# Patient Record
Sex: Female | Born: 1970
Health system: Southern US, Community
[De-identification: ages and names within clinical notes are randomized; demographics above are authoritative.]

## PROBLEM LIST (undated history)

## (undated) DIAGNOSIS — I2699 Other pulmonary embolism without acute cor pulmonale: Secondary | ICD-10-CM

## (undated) DIAGNOSIS — G43909 Migraine, unspecified, not intractable, without status migrainosus: Secondary | ICD-10-CM

## (undated) DIAGNOSIS — J40 Bronchitis, not specified as acute or chronic: Secondary | ICD-10-CM

## (undated) DIAGNOSIS — N189 Chronic kidney disease, unspecified: Secondary | ICD-10-CM

## (undated) DIAGNOSIS — D649 Anemia, unspecified: Secondary | ICD-10-CM

## (undated) DIAGNOSIS — I82409 Acute embolism and thrombosis of unspecified deep veins of unspecified lower extremity: Secondary | ICD-10-CM

## (undated) DIAGNOSIS — J45909 Unspecified asthma, uncomplicated: Secondary | ICD-10-CM

## (undated) DIAGNOSIS — I10 Essential (primary) hypertension: Secondary | ICD-10-CM

## (undated) DIAGNOSIS — E669 Obesity, unspecified: Secondary | ICD-10-CM

## (undated) DIAGNOSIS — N200 Calculus of kidney: Secondary | ICD-10-CM

## (undated) DIAGNOSIS — M1990 Unspecified osteoarthritis, unspecified site: Secondary | ICD-10-CM

## (undated) HISTORY — DX: Unspecified asthma, uncomplicated: J45.909

## (undated) HISTORY — DX: Essential (primary) hypertension: I10

## (undated) HISTORY — DX: Chronic kidney disease, unspecified: N18.9

## (undated) HISTORY — PX: CERVIX LESION DESTRUCTION: SHX591

## (undated) HISTORY — DX: Anemia, unspecified: D64.9

## (undated) HISTORY — DX: Calculus of kidney: N20.0

## (undated) HISTORY — DX: Migraine, unspecified, not intractable, without status migrainosus: G43.909

## (undated) HISTORY — DX: Obesity, unspecified: E66.9

## (undated) HISTORY — PX: TUBAL LIGATION: SHX77

---

## 1998-07-09 ENCOUNTER — Emergency Department (HOSPITAL_COMMUNITY): Admission: EM | Admit: 1998-07-09 | Discharge: 1998-07-09 | Payer: Self-pay | Admitting: Emergency Medicine

## 1999-01-03 ENCOUNTER — Emergency Department (HOSPITAL_COMMUNITY): Admission: EM | Admit: 1999-01-03 | Discharge: 1999-01-03 | Payer: Self-pay | Admitting: Emergency Medicine

## 1999-01-12 ENCOUNTER — Encounter: Payer: Self-pay | Admitting: Emergency Medicine

## 1999-01-12 ENCOUNTER — Emergency Department (HOSPITAL_COMMUNITY): Admission: EM | Admit: 1999-01-12 | Discharge: 1999-01-12 | Payer: Self-pay | Admitting: Emergency Medicine

## 2000-10-20 ENCOUNTER — Encounter: Payer: Self-pay | Admitting: Emergency Medicine

## 2000-10-20 ENCOUNTER — Emergency Department (HOSPITAL_COMMUNITY): Admission: EM | Admit: 2000-10-20 | Discharge: 2000-10-20 | Payer: Self-pay | Admitting: Emergency Medicine

## 2000-12-13 ENCOUNTER — Emergency Department (HOSPITAL_COMMUNITY): Admission: EM | Admit: 2000-12-13 | Discharge: 2000-12-13 | Payer: Self-pay | Admitting: Emergency Medicine

## 2000-12-13 ENCOUNTER — Encounter: Payer: Self-pay | Admitting: Emergency Medicine

## 2001-01-28 ENCOUNTER — Emergency Department (HOSPITAL_COMMUNITY): Admission: EM | Admit: 2001-01-28 | Discharge: 2001-01-28 | Payer: Self-pay | Admitting: Emergency Medicine

## 2001-03-14 ENCOUNTER — Emergency Department (HOSPITAL_COMMUNITY): Admission: EM | Admit: 2001-03-14 | Discharge: 2001-03-14 | Payer: Self-pay | Admitting: Emergency Medicine

## 2001-03-14 ENCOUNTER — Encounter: Payer: Self-pay | Admitting: Emergency Medicine

## 2001-09-11 ENCOUNTER — Emergency Department (HOSPITAL_COMMUNITY): Admission: EM | Admit: 2001-09-11 | Discharge: 2001-09-11 | Payer: Self-pay

## 2001-09-15 ENCOUNTER — Emergency Department (HOSPITAL_COMMUNITY): Admission: EM | Admit: 2001-09-15 | Discharge: 2001-09-15 | Payer: Self-pay | Admitting: Emergency Medicine

## 2001-09-15 ENCOUNTER — Encounter: Payer: Self-pay | Admitting: Emergency Medicine

## 2001-12-03 ENCOUNTER — Encounter: Admission: RE | Admit: 2001-12-03 | Discharge: 2001-12-03 | Payer: Self-pay | Admitting: Family Medicine

## 2001-12-03 ENCOUNTER — Other Ambulatory Visit: Admission: RE | Admit: 2001-12-03 | Discharge: 2001-12-03 | Payer: Self-pay | Admitting: Obstetrics

## 2002-01-08 ENCOUNTER — Encounter: Admission: RE | Admit: 2002-01-08 | Discharge: 2002-01-08 | Payer: Self-pay | Admitting: Family Medicine

## 2002-01-16 ENCOUNTER — Encounter: Admission: RE | Admit: 2002-01-16 | Discharge: 2002-01-16 | Payer: Self-pay | Admitting: Family Medicine

## 2002-04-29 ENCOUNTER — Encounter: Admission: RE | Admit: 2002-04-29 | Discharge: 2002-04-29 | Payer: Self-pay | Admitting: Family Medicine

## 2002-07-16 ENCOUNTER — Encounter: Admission: RE | Admit: 2002-07-16 | Discharge: 2002-07-16 | Payer: Self-pay | Admitting: Family Medicine

## 2002-11-03 ENCOUNTER — Inpatient Hospital Stay (HOSPITAL_COMMUNITY): Admission: AD | Admit: 2002-11-03 | Discharge: 2002-11-03 | Payer: Self-pay | Admitting: *Deleted

## 2003-01-14 ENCOUNTER — Encounter: Admission: RE | Admit: 2003-01-14 | Discharge: 2003-01-14 | Payer: Self-pay | Admitting: Family Medicine

## 2003-02-09 ENCOUNTER — Encounter: Admission: RE | Admit: 2003-02-09 | Discharge: 2003-02-09 | Payer: Self-pay | Admitting: Sports Medicine

## 2003-06-04 ENCOUNTER — Encounter: Admission: RE | Admit: 2003-06-04 | Discharge: 2003-06-04 | Payer: Self-pay | Admitting: Family Medicine

## 2003-08-16 ENCOUNTER — Encounter: Admission: RE | Admit: 2003-08-16 | Discharge: 2003-08-16 | Payer: Self-pay | Admitting: Family Medicine

## 2003-08-21 ENCOUNTER — Emergency Department (HOSPITAL_COMMUNITY): Admission: AD | Admit: 2003-08-21 | Discharge: 2003-08-21 | Payer: Self-pay | Admitting: Emergency Medicine

## 2003-08-23 ENCOUNTER — Encounter: Admission: RE | Admit: 2003-08-23 | Discharge: 2003-08-23 | Payer: Self-pay | Admitting: Family Medicine

## 2003-08-23 ENCOUNTER — Observation Stay (HOSPITAL_COMMUNITY): Admission: AD | Admit: 2003-08-23 | Discharge: 2003-08-25 | Payer: Self-pay | Admitting: Family Medicine

## 2003-08-26 ENCOUNTER — Encounter: Admission: RE | Admit: 2003-08-26 | Discharge: 2003-08-26 | Payer: Self-pay | Admitting: Family Medicine

## 2003-09-06 ENCOUNTER — Encounter: Admission: RE | Admit: 2003-09-06 | Discharge: 2003-09-06 | Payer: Self-pay | Admitting: Family Medicine

## 2003-09-22 ENCOUNTER — Encounter: Admission: RE | Admit: 2003-09-22 | Discharge: 2003-09-22 | Payer: Self-pay | Admitting: Family Medicine

## 2003-11-26 ENCOUNTER — Encounter: Admission: RE | Admit: 2003-11-26 | Discharge: 2003-11-26 | Payer: Self-pay | Admitting: Family Medicine

## 2004-02-24 ENCOUNTER — Encounter: Admission: RE | Admit: 2004-02-24 | Discharge: 2004-02-24 | Payer: Self-pay | Admitting: Family Medicine

## 2004-05-03 ENCOUNTER — Encounter: Admission: RE | Admit: 2004-05-03 | Discharge: 2004-05-03 | Payer: Self-pay | Admitting: Family Medicine

## 2004-05-10 ENCOUNTER — Encounter: Admission: RE | Admit: 2004-05-10 | Discharge: 2004-05-10 | Payer: Self-pay | Admitting: Family Medicine

## 2004-08-15 ENCOUNTER — Ambulatory Visit: Payer: Self-pay | Admitting: Sports Medicine

## 2004-09-11 ENCOUNTER — Ambulatory Visit: Payer: Self-pay

## 2004-11-03 ENCOUNTER — Ambulatory Visit: Payer: Self-pay | Admitting: Sports Medicine

## 2004-11-10 ENCOUNTER — Ambulatory Visit: Payer: Self-pay | Admitting: Sports Medicine

## 2005-01-18 ENCOUNTER — Ambulatory Visit: Payer: Self-pay | Admitting: Family Medicine

## 2005-03-30 ENCOUNTER — Ambulatory Visit: Payer: Self-pay | Admitting: Family Medicine

## 2005-04-19 ENCOUNTER — Ambulatory Visit: Payer: Self-pay | Admitting: Sports Medicine

## 2005-05-11 ENCOUNTER — Ambulatory Visit: Payer: Self-pay | Admitting: Family Medicine

## 2005-05-14 ENCOUNTER — Ambulatory Visit: Payer: Self-pay | Admitting: Family Medicine

## 2005-06-01 ENCOUNTER — Ambulatory Visit: Payer: Self-pay | Admitting: Family Medicine

## 2005-06-05 ENCOUNTER — Ambulatory Visit (HOSPITAL_COMMUNITY): Admission: RE | Admit: 2005-06-05 | Discharge: 2005-06-05 | Payer: Self-pay | Admitting: Family Medicine

## 2005-08-14 ENCOUNTER — Encounter: Admission: RE | Admit: 2005-08-14 | Discharge: 2005-08-14 | Payer: Self-pay | Admitting: Sports Medicine

## 2005-09-12 ENCOUNTER — Ambulatory Visit: Payer: Self-pay | Admitting: Family Medicine

## 2005-11-20 ENCOUNTER — Ambulatory Visit: Payer: Self-pay | Admitting: Sports Medicine

## 2005-11-27 ENCOUNTER — Ambulatory Visit: Payer: Self-pay | Admitting: Sports Medicine

## 2006-03-27 ENCOUNTER — Ambulatory Visit: Payer: Self-pay | Admitting: Family Medicine

## 2006-04-21 ENCOUNTER — Encounter (INDEPENDENT_AMBULATORY_CARE_PROVIDER_SITE_OTHER): Payer: Self-pay | Admitting: *Deleted

## 2006-04-21 LAB — CONVERTED CEMR LAB

## 2006-05-01 ENCOUNTER — Ambulatory Visit: Payer: Self-pay | Admitting: Family Medicine

## 2006-07-26 ENCOUNTER — Ambulatory Visit: Payer: Self-pay | Admitting: Family Medicine

## 2006-07-30 ENCOUNTER — Ambulatory Visit: Payer: Self-pay | Admitting: Family Medicine

## 2006-08-28 ENCOUNTER — Ambulatory Visit: Payer: Self-pay | Admitting: Family Medicine

## 2006-10-30 ENCOUNTER — Encounter (INDEPENDENT_AMBULATORY_CARE_PROVIDER_SITE_OTHER): Payer: Self-pay | Admitting: Family Medicine

## 2006-10-30 ENCOUNTER — Ambulatory Visit: Payer: Self-pay | Admitting: Family Medicine

## 2006-10-30 LAB — CONVERTED CEMR LAB
Chlamydia, DNA Probe: NEGATIVE
GC Probe Amp, Genital: NEGATIVE
TSH: 0.794 microintl units/mL (ref 0.350–5.50)

## 2006-11-19 ENCOUNTER — Ambulatory Visit: Payer: Self-pay | Admitting: Family Medicine

## 2006-12-13 ENCOUNTER — Ambulatory Visit: Payer: Self-pay | Admitting: Family Medicine

## 2006-12-19 DIAGNOSIS — E669 Obesity, unspecified: Secondary | ICD-10-CM | POA: Insufficient documentation

## 2006-12-20 ENCOUNTER — Encounter (INDEPENDENT_AMBULATORY_CARE_PROVIDER_SITE_OTHER): Payer: Self-pay | Admitting: *Deleted

## 2006-12-25 ENCOUNTER — Ambulatory Visit: Payer: Self-pay | Admitting: Family Medicine

## 2007-01-17 ENCOUNTER — Ambulatory Visit: Payer: Self-pay | Admitting: Family Medicine

## 2007-01-17 DIAGNOSIS — G43009 Migraine without aura, not intractable, without status migrainosus: Secondary | ICD-10-CM

## 2007-03-21 ENCOUNTER — Ambulatory Visit: Payer: Self-pay | Admitting: Family Medicine

## 2007-03-21 DIAGNOSIS — D500 Iron deficiency anemia secondary to blood loss (chronic): Secondary | ICD-10-CM | POA: Insufficient documentation

## 2007-03-21 DIAGNOSIS — D509 Iron deficiency anemia, unspecified: Secondary | ICD-10-CM | POA: Insufficient documentation

## 2007-03-21 LAB — CONVERTED CEMR LAB
HCT: 38 %
Hemoglobin: 12.9 g/dL
MCV: 89.6 fL
Platelets: 274 10*3/uL
RBC: 4.24 M/uL
WBC: 5.7 10*3/uL

## 2007-03-28 ENCOUNTER — Ambulatory Visit: Payer: Self-pay

## 2007-04-11 ENCOUNTER — Ambulatory Visit: Payer: Self-pay | Admitting: Family Medicine

## 2007-05-06 ENCOUNTER — Ambulatory Visit: Payer: Self-pay | Admitting: Family Medicine

## 2007-05-06 ENCOUNTER — Encounter: Payer: Self-pay | Admitting: Family Medicine

## 2007-05-06 LAB — CONVERTED CEMR LAB: Whiff Test: POSITIVE

## 2007-05-09 ENCOUNTER — Encounter: Payer: Self-pay | Admitting: Family Medicine

## 2007-05-13 ENCOUNTER — Telehealth (INDEPENDENT_AMBULATORY_CARE_PROVIDER_SITE_OTHER): Payer: Self-pay | Admitting: *Deleted

## 2007-05-19 ENCOUNTER — Telehealth: Payer: Self-pay | Admitting: Family Medicine

## 2007-06-03 ENCOUNTER — Encounter: Payer: Self-pay | Admitting: Family Medicine

## 2007-06-03 ENCOUNTER — Ambulatory Visit: Payer: Self-pay | Admitting: Family Medicine

## 2007-06-03 DIAGNOSIS — R51 Headache: Secondary | ICD-10-CM

## 2007-06-03 DIAGNOSIS — R519 Headache, unspecified: Secondary | ICD-10-CM | POA: Insufficient documentation

## 2007-06-03 LAB — CONVERTED CEMR LAB
Cholesterol: 143 mg/dL
HDL: 70 mg/dL
LDL Cholesterol: 63 mg/dL
Total CHOL/HDL Ratio: 2
Triglycerides: 51 mg/dL
VLDL: 10 mg/dL

## 2007-06-04 ENCOUNTER — Encounter: Payer: Self-pay | Admitting: Family Medicine

## 2007-06-17 ENCOUNTER — Encounter: Payer: Self-pay | Admitting: Family Medicine

## 2007-09-03 ENCOUNTER — Ambulatory Visit: Payer: Self-pay | Admitting: Family Medicine

## 2007-09-03 ENCOUNTER — Encounter: Payer: Self-pay | Admitting: Family Medicine

## 2007-09-03 LAB — CONVERTED CEMR LAB
Chlamydia, DNA Probe: NEGATIVE
GC Probe Amp, Genital: NEGATIVE
Whiff Test: POSITIVE

## 2007-09-05 ENCOUNTER — Encounter: Payer: Self-pay | Admitting: Family Medicine

## 2008-02-18 ENCOUNTER — Telehealth: Payer: Self-pay | Admitting: *Deleted

## 2008-02-19 ENCOUNTER — Ambulatory Visit: Payer: Self-pay | Admitting: Family Medicine

## 2008-02-26 ENCOUNTER — Encounter: Payer: Self-pay | Admitting: Family Medicine

## 2008-02-26 ENCOUNTER — Ambulatory Visit: Payer: Self-pay | Admitting: Family Medicine

## 2008-02-26 DIAGNOSIS — I10 Essential (primary) hypertension: Secondary | ICD-10-CM

## 2008-02-26 LAB — CONVERTED CEMR LAB
ALT: 12 U/L
AST: 17 U/L
Albumin: 4 g/dL
Alkaline Phosphatase: 40 U/L
BUN: 8 mg/dL
CO2: 23 meq/L
Calcium: 8.8 mg/dL
Chloride: 109 meq/L
Cholesterol: 167 mg/dL
Creatinine, Ser: 0.76 mg/dL
Glucose, Bld: 93 mg/dL
HCT: 37.3 %
HDL: 68 mg/dL
Hemoglobin: 12 g/dL
LDL Cholesterol: 89 mg/dL
MCHC: 32.2 g/dL
MCV: 91 fL
Platelets: 255 K/uL
Potassium: 3.8 meq/L
RBC: 4.1 M/uL
RDW: 12.8 %
Sodium: 142 meq/L
TSH: 0.627 u[IU]/mL
Total Bilirubin: 0.3 mg/dL
Total CHOL/HDL Ratio: 2.5
Total Protein: 6.8 g/dL
Triglycerides: 51 mg/dL
VLDL: 10 mg/dL
WBC: 5.7 10*3/microliter

## 2008-02-27 ENCOUNTER — Encounter: Payer: Self-pay | Admitting: Family Medicine

## 2008-03-18 ENCOUNTER — Encounter: Payer: Self-pay | Admitting: Family Medicine

## 2008-03-22 ENCOUNTER — Ambulatory Visit: Payer: Self-pay | Admitting: Family Medicine

## 2008-03-22 ENCOUNTER — Encounter: Payer: Self-pay | Admitting: Family Medicine

## 2008-03-22 LAB — CONVERTED CEMR LAB
BUN: 11 mg/dL (ref 6–23)
CO2: 23 meq/L (ref 19–32)
Calcium: 9.1 mg/dL (ref 8.4–10.5)
Chloride: 104 meq/L (ref 96–112)
Creatinine, Ser: 0.84 mg/dL (ref 0.40–1.20)
Glucose, Bld: 95 mg/dL (ref 70–99)
Potassium: 4.1 meq/L (ref 3.5–5.3)
Sodium: 140 meq/L (ref 135–145)

## 2008-05-06 ENCOUNTER — Encounter: Payer: Self-pay | Admitting: Family Medicine

## 2008-05-06 ENCOUNTER — Ambulatory Visit: Payer: Self-pay | Admitting: Family Medicine

## 2008-05-06 LAB — CONVERTED CEMR LAB: Whiff Test: NEGATIVE

## 2008-05-10 LAB — CONVERTED CEMR LAB
Chlamydia, DNA Probe: NEGATIVE
Cholesterol: 170 mg/dL (ref 0–200)
GC Probe Amp, Genital: NEGATIVE
HDL: 66 mg/dL (ref >39)
LDL Cholesterol: 83 mg/dL (ref 0–99)
Total CHOL/HDL Ratio: 2.6
Triglycerides: 103 mg/dL (ref <150)
VLDL: 21 mg/dL (ref 0–40)

## 2008-06-04 ENCOUNTER — Telehealth: Payer: Self-pay | Admitting: *Deleted

## 2008-06-17 ENCOUNTER — Encounter: Payer: Self-pay | Admitting: Family Medicine

## 2008-08-12 ENCOUNTER — Telehealth: Payer: Self-pay | Admitting: Family Medicine

## 2008-08-20 ENCOUNTER — Telehealth: Payer: Self-pay | Admitting: Family Medicine

## 2008-10-18 ENCOUNTER — Ambulatory Visit: Payer: Self-pay | Admitting: Family Medicine

## 2008-10-18 ENCOUNTER — Encounter: Payer: Self-pay | Admitting: Family Medicine

## 2008-10-18 LAB — CONVERTED CEMR LAB
Chlamydia, DNA Probe: NEGATIVE
GC Probe Amp, Genital: NEGATIVE
Vitamin B-12: 339 pg/mL (ref 211–911)
Whiff Test: NEGATIVE

## 2008-10-19 ENCOUNTER — Encounter: Payer: Self-pay | Admitting: Family Medicine

## 2008-11-05 ENCOUNTER — Telehealth: Payer: Self-pay | Admitting: *Deleted

## 2008-11-05 ENCOUNTER — Ambulatory Visit: Payer: Self-pay | Admitting: Family Medicine

## 2008-12-01 ENCOUNTER — Telehealth: Payer: Self-pay | Admitting: Family Medicine

## 2008-12-10 ENCOUNTER — Ambulatory Visit: Payer: Self-pay | Admitting: Family Medicine

## 2009-05-04 ENCOUNTER — Telehealth: Payer: Self-pay | Admitting: Family Medicine

## 2009-05-11 ENCOUNTER — Encounter: Payer: Self-pay | Admitting: Family Medicine

## 2009-05-11 ENCOUNTER — Ambulatory Visit: Payer: Self-pay | Admitting: Family Medicine

## 2009-05-11 LAB — CONVERTED CEMR LAB
BUN: 12 mg/dL (ref 6–23)
CO2: 22 meq/L (ref 19–32)
Calcium: 8.9 mg/dL (ref 8.4–10.5)
Chlamydia, DNA Probe: NEGATIVE
Chloride: 109 meq/L (ref 96–112)
Creatinine, Ser: 0.86 mg/dL (ref 0.40–1.20)
GC Probe Amp, Genital: NEGATIVE
Glucose, Bld: 92 mg/dL (ref 70–99)
HCT: 38.4 % (ref 36.0–46.0)
Hemoglobin: 12.1 g/dL (ref 12.0–15.0)
MCHC: 31.5 g/dL (ref 30.0–36.0)
MCV: 91 fL (ref 78.0–100.0)
Platelets: 260 10*3/uL (ref 150–400)
Potassium: 3.9 meq/L (ref 3.5–5.3)
RBC: 4.22 M/uL (ref 3.87–5.11)
RDW: 13.7 % (ref 11.5–15.5)
Sodium: 141 meq/L (ref 135–145)
WBC: 6.8 10*3/uL (ref 4.0–10.5)

## 2009-05-12 ENCOUNTER — Encounter: Payer: Self-pay | Admitting: Family Medicine

## 2009-06-14 ENCOUNTER — Telehealth: Payer: Self-pay | Admitting: Family Medicine

## 2009-06-30 ENCOUNTER — Telehealth: Payer: Self-pay | Admitting: Family Medicine

## 2009-08-08 ENCOUNTER — Ambulatory Visit: Payer: Self-pay | Admitting: Family Medicine

## 2009-08-16 ENCOUNTER — Ambulatory Visit: Payer: Self-pay | Admitting: Family Medicine

## 2009-10-25 ENCOUNTER — Telehealth: Payer: Self-pay | Admitting: Family Medicine

## 2009-11-11 ENCOUNTER — Telehealth: Payer: Self-pay | Admitting: Family Medicine

## 2009-12-13 ENCOUNTER — Encounter: Payer: Self-pay | Admitting: Family Medicine

## 2009-12-13 ENCOUNTER — Ambulatory Visit: Payer: Self-pay | Admitting: Family Medicine

## 2009-12-13 LAB — CONVERTED CEMR LAB
Chlamydia, DNA Probe: NEGATIVE
GC Probe Amp, Genital: NEGATIVE
Whiff Test: NEGATIVE

## 2009-12-14 ENCOUNTER — Telehealth: Payer: Self-pay | Admitting: *Deleted

## 2010-02-08 ENCOUNTER — Encounter: Payer: Self-pay | Admitting: Family Medicine

## 2010-02-08 ENCOUNTER — Emergency Department (HOSPITAL_COMMUNITY): Admission: EM | Admit: 2010-02-08 | Discharge: 2010-02-08 | Payer: Self-pay | Admitting: Emergency Medicine

## 2010-05-08 ENCOUNTER — Ambulatory Visit: Payer: Self-pay | Admitting: Family Medicine

## 2010-05-08 ENCOUNTER — Encounter: Payer: Self-pay | Admitting: Family Medicine

## 2010-05-08 LAB — CONVERTED CEMR LAB
BUN: 12 mg/dL (ref 6–23)
CO2: 18 meq/L — ABNORMAL LOW (ref 19–32)
Calcium: 8.6 mg/dL (ref 8.4–10.5)
Chloride: 108 meq/L (ref 96–112)
Cholesterol: 173 mg/dL (ref 0–200)
Creatinine, Ser: 0.93 mg/dL (ref 0.40–1.20)
Glucose, Bld: 82 mg/dL (ref 70–99)
HDL: 72 mg/dL (ref >39)
LDL Cholesterol: 86 mg/dL (ref 0–99)
Sodium: 140 meq/L (ref 135–145)
Total CHOL/HDL Ratio: 2.4
Triglycerides: 75 mg/dL (ref <150)
VLDL: 15 mg/dL (ref 0–40)

## 2010-06-12 ENCOUNTER — Telehealth: Payer: Self-pay | Admitting: *Deleted

## 2010-07-28 ENCOUNTER — Ambulatory Visit: Payer: Self-pay | Admitting: Family Medicine

## 2010-07-28 ENCOUNTER — Encounter: Payer: Self-pay | Admitting: Family Medicine

## 2010-07-28 LAB — CONVERTED CEMR LAB
Chlamydia, DNA Probe: NEGATIVE
GC Probe Amp, Genital: NEGATIVE
Whiff Test: POSITIVE

## 2010-09-26 ENCOUNTER — Encounter: Payer: Self-pay | Admitting: Family Medicine

## 2010-10-06 ENCOUNTER — Telehealth: Payer: Self-pay | Admitting: Family Medicine

## 2010-10-07 ENCOUNTER — Emergency Department (HOSPITAL_COMMUNITY)
Admission: EM | Admit: 2010-10-07 | Discharge: 2010-10-07 | Payer: Self-pay | Source: Home / Self Care | Admitting: Family Medicine

## 2010-11-23 ENCOUNTER — Telehealth: Payer: Self-pay | Admitting: Family Medicine

## 2010-11-23 NOTE — Assessment & Plan Note (Signed)
Summary: female problem,tcb   Vital Signs:  Patient profile:   40 year old female Height:      63.5 inches Weight:      181 pounds BMI:     31.67 BSA:     1.87 Temp:     98.4 degrees F Pulse rate:   60 / minute BP sitting:   139 / 85  Vitals Entered By: Jone Baseman CMA (July 28, 2010 10:38 AM) CC: vaginal discharge Is Patient Diabetic? No Pain Assessment Patient in pain? no        Primary Care Provider:  Ellin Mayhew MD  CC:  vaginal discharge.  History of Present Illness: 40 yo F:  1. Vaginal Discharge: x several days, clear-brown. Ensorses PMHx of frequent BV. Denies odor and itch. + sexually active with 1 partner (but not monogamous). + Hx of STDs. Recently (<6 months) negative for HIV. Expects next menses in 1 week (on OCPs and s/p tubal ligation).  Habits & Providers  Alcohol-Tobacco-Diet     Tobacco Status: quit     Year Quit: 2005  Current Medications (verified): 1)  Propranolol Hcl 40 Mg  Tabs (Propranolol Hcl) .Marland Kitchen.. 1 Tab By Mouth Daily 2)  Sprintec 28 0.25-35 Mg-Mcg Tabs (Norgestimate-Eth Estradiol) .Marland Kitchen.. 1 Tab By Mouth Daily As Directed. Dispense: 3 Months 3)  Celebrex 100 Mg  Caps (Celecoxib) .Marland Kitchen.. 1 By Mouth Two Times A Day As Needed 4)  Flonase 50 Mcg/act  Susp (Fluticasone Propionate) .... 2 Sprays Each Nostril Daily 5)  Topamax 50 Mg Tabs (Topiramate) .Marland Kitchen.. 1 By Mouth Bid 6)  Cetirizine Hcl 10 Mg Tabs (Cetirizine Hcl) .Marland Kitchen.. 1 Tab By Mouth Daily 7)  Nystop 100000 Unit/gm Powd (Nystatin) .... Apply To Affected Area Twice A Day As Needed Disp: 60 Gram 8)  Metronidazole 0.75 % Gel (Metronidazole) .... One Applicator Full At Bedtime For 5 Nights., Qs 9)  Metronidazole 500 Mg  Tabs (Metronidazole) .... One By Mouth Two Times A Day X 7 Days  Allergies (verified): No Known Drug Allergies PMH-FH-SH reviewed for relevance  Review of Systems      See HPI  Physical Exam  General:  Well-developed, well-nourished, in no acute distress; alert,  appropriate and cooperative throughout examination. Vitals reviewed. Genitalia:  Pelvic Exam:        External: normal female genitalia without lesions or masses        Vagina: normal without lesions or masses, small amount of thin white discharge - samples taken        Cervix: normal without lesions or masses        Adnexa: normal bimanual exam without masses or fullness   Impression & Recommendations:  Problem # 1:  VAGINAL DISCHARGE (ICD-623.5) Assessment New  Orders: [facility]- Est Level  3 ([address])  Problem # 2:  BACTERIAL VAGINITIS (ICD-616.10) Assessment: New  Her updated medication list for this problem includes:    Metronidazole 500 Mg Tabs (Metronidazole) ..... One by mouth two times a day x 7 days  Orders: [facility]- Est Level  3 ([address])  Complete Medication List: 1)  Propranolol Hcl 40 Mg Tabs (Propranolol hcl) .Marland Kitchen.. 1 tab by mouth daily 2)  Sprintec 28 0.25-35 Mg-mcg Tabs (Norgestimate-eth estradiol) .Marland Kitchen.. 1 tab by mouth daily as directed. dispense: 3 months 3)  Celebrex 100 Mg Caps (Celecoxib) .Marland Kitchen.. 1 by mouth two times a day as needed 4)  Flonase 50 Mcg/act Susp (Fluticasone propionate) .... 2 sprays each nostril daily 5)  Topamax 50 Mg Tabs (  Topiramate) .Marland Kitchen.. 1 by mouth bid 6)  Cetirizine Hcl 10 Mg Tabs (Cetirizine hcl) .Marland Kitchen.. 1 tab by mouth daily 7)  Nystop 100000 Unit/gm Powd (Nystatin) .... Apply to affected area twice a day as needed disp: 60 gram 8)  Metronidazole 0.75 % Gel (Metronidazole) .... One applicator full at bedtime for 5 nights., qs 9)  Metronidazole 500 Mg Tabs (Metronidazole) .... One by mouth two times a day x 7 days  Other Orders: GC/Chlamydia-[facility] (87591/87491) [facility] Prescriptions: METRONIDAZOLE 500 MG  TABS (METRONIDAZOLE) one by mouth two times a day x 7 days  #14 x 0   Entered and Authorized by:   Helane Rima DO   Signed by:   Helane Rima DO on 07/28/2010   Method used:   Print then Give to Patient   RxID:    [id number]   Laboratory Results  Date/Time Received: July 28, 2010 11:10 AM  Date/Time Reported: July 28, 2010 11:17 AM   [employer] [place] Source: vaginal WBC/hpf: 1-5 Bacteria/hpf: 3+  Cocci Clue cells/hpf: moderate  Positive whiff Yeast/hpf: none Trichomonas/hpf: none Comments: ...........test performed by...........Marland KitchenTerese Door, CMA

## 2010-11-23 NOTE — Miscellaneous (Signed)
Summary: CP  Clinical Lists Changes cp x 40 minutes constantly. she was unable to describe where it was. just kept saying she had CP. worse with lifting & talking. denies SOB, sweating, nausea, no jaw or neck pain. advised calling 911 & going to ED. she agreed.Golden Circle RN  February 08, 2010 3:54 PM

## 2010-11-23 NOTE — Progress Notes (Signed)
Summary: Rx Req  Phone Note Refill Request Message from:  Patient  Refills Requested: Medication #1:  PROPRANOLOL HCL 40 MG  TABS 1 tab by mouth daily PT USES THE HEALTH DEPT.  Initial call taken by: Clydell Hakim,  November 11, 2009 1:50 PM  Follow-up for Phone Call        will forward to MD. Follow-up by: Theresia Lo RN,  November 11, 2009 1:51 PM    Prescriptions: PROPRANOLOL HCL 40 MG  TABS (PROPRANOLOL HCL) 1 tab by mouth daily  #30 x 11   Entered and Authorized by:   Ardeen Garland  MD   Signed by:   Ardeen Garland  MD on 11/11/2009   Method used:   Faxed to ...       [facility] [address] (retail)       [address]       [facility]       [place], [place]  [address]       Ph: [phone]       Fax: [phone]   RxID:   [id number]

## 2010-11-23 NOTE — Assessment & Plan Note (Signed)
Summary: CPE   Vital Signs:  Patient profile:   40 year old female Height:      63.5 inches Weight:      192 pounds BMI:     33.60 Temp:     98.5 degrees F oral BP sitting:   128 / 86  (left arm) Cuff size:   large  Vitals Entered By: Tessie Fass CMA (May 08, 2010 9:43 AM) CC: complete physical Is Patient Diabetic? No Pain Assessment Patient in pain? no        Primary Care Provider:  Ellin Mayhew MD  CC:  complete physical.  History of Present Illness: frequent sore throats- every 3 months for past 9 months.  last time with a cold,  today the pain has almost improved, gargling with salt water helps improve it.  Currently almost resolved.    exercise- working out at [employer], 6 days per week, has trainer,  usually works out about 1 hour, steps, rope climbing, different floor exercise  Pt stopped smoking 5 years ago  Nutritionist is also giving instructions in diet.  Has cut out candy and soda from diet.  Usually eats 1 fruit/vegtable per day.   Pt has had 3 negative paps during the past 3 years.  Up to date on pap smear.    Pt has not noticed any breast nodules or changes in breast tissue   Habits & Providers  Alcohol-Tobacco-Diet     Tobacco Status: quit  Current Medications (verified): 1)  Propranolol Hcl 40 Mg  Tabs (Propranolol Hcl) .Marland Kitchen.. 1 Tab By Mouth Daily 2)  Sprintec 28 0.25-35 Mg-Mcg Tabs (Norgestimate-Eth Estradiol) .Marland Kitchen.. 1 Tab By Mouth Daily As Directed. Dispense: 3 Months 3)  Celebrex 100 Mg  Caps (Celecoxib) .Marland Kitchen.. 1 By Mouth Two Times A Day As Needed 4)  Flonase 50 Mcg/act  Susp (Fluticasone Propionate) .... 2 Sprays Each Nostril Daily 5)  Topamax 50 Mg Tabs (Topiramate) .Marland Kitchen.. 1 By Mouth Bid 6)  Cetirizine Hcl 10 Mg Tabs (Cetirizine Hcl) .Marland Kitchen.. 1 Tab By Mouth Daily 7)  Nystop 100000 Unit/gm Powd (Nystatin) .... Apply To Affected Area Twice A Day As Needed Disp: 60 Gram 8)  Metronidazole 0.75 % Gel (Metronidazole) .... 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  Risk Factors: Caffeine Use: 1 (02/26/2008)  Risk Factors: Smoking Status: quit (05/08/2010)  Past Medical History: G5 P 3023 Hx abnl pap (cryo) Likely medication overuse HA Recurrent BV Yaz (7/07) DUB treatment Normal [place] 10/06  Family History: Reviewed history from 12/19/2006 and no changes required. Mother and two siblings have schizophrenia, Mother has HTN and DM  Social History: Reviewed history from 05/11/2009 and no changes required. Used to smoke 1ppd (6 pack year history).Quit 2/05.marijuana quit2002, Occ etoh use, ,; 3 children born(89 boy, 90-96 girls) living at home; From [place] originally; Works as a [employer]Smoking Status:  quit  Review of Systems  as per hpi  Physical Exam  General:  VSS Well-developed,well-nourished,in no acute distress; alert,appropriate and cooperative throughout examination Mouth:  Oral mucosa and oropharynx without lesions or exudates. Poor dentition. Breasts:  No mass, nodules, thickening, tenderness, bulging, retraction, inflamation, nipple discharge or skin changes noted.   Lungs:  Normal respiratory effort, chest expands symmetrically. Lungs are clear to auscultation, no crackles or wheezes. Heart:  Normal rate and regular rhythm. S1 and S2 normal without gallop, murmur, click,  rub or other extra sounds. Abdomen:  Bowel sounds positive,abdomen soft and non-tender without masses, organomegaly or hernias noted. Msk:  normal ROM and no joint tenderness.   Extremities:  no edema Skin:  no rashes Psych:  Oriented X3, memory intact for recent and remote, normally interactive, good eye contact, and not anxious appearing.     Impression & Recommendations:  Problem # 1:  SCREENING FOR MALIGNANT NEOPLASM OF THE CERVIX (ICD-V76.2) Pt has had negative pap x 3 consective years.  Pt does have a hx of cervical cryotherapy and I do not have these records.  Pt was having period and did not want to have pelvic exam done today.  Will plan to repeat pap in one year.  I have requested that pt have records from her previous doctors office where cryo was performed to our clinic.  If this report shows anything that raises concern I will contact pt and have her return sooner for repeat pap if indicated.   Problem # 2:  CONTACT OR EXPOSURE TO OTHER VIRAL DISEASES (ICD-V01.79) Pt seen a few months back after condom broke during sexual relations.  Pt requesting HIV and RPR follow up testing be done today.  Pt states that since that time she has not had unprotected sex.   Orders: HIV-[facility] ([id number]) RPR-[facility] [phone])  Problem # 3:  HYPERTENSION, BENIGN ESSENTIAL (ICD-401.1) Pt took herself off of HCTZ 2-3 weeks ago because she states that it caused her to "run to the restroom all of the time."  Pt has been taking the propranolol as directed.  Will continue with propanolol only since her bp is wnl today.  Will continue to monitor.   The following medications were removed from the medication list:    Hydrochlorothiazide 12.5 Mg Tabs (Hydrochlorothiazide) .Marland Kitchen... Take 1 tab  by mouth every morning Her updated medication list for this problem includes:    Propranolol Hcl 40 Mg Tabs (Propranolol hcl) .Marland Kitchen... 1 tab by mouth daily  Problem # 4:  OBESITY, NOS (ICD-278.00) From flowsheet, pt has  had some weightloss- BMI from 37 to 33.  Pt encouraged to continue with exercise plan and continue to learn more about healthy nutrition.  Encouraged pt to eat 5 fruits/vegtables per day.  Currently only eating 1 serving.  Orders: Lipid-[facility] ([id number]) Basic Met-[facility] ([id number])  Problem # 5:  Preventive Health Care (ICD-V70.0) order fasting lipid panel and will also check fasting BG level off of BMP.  Pt also given instructions about Ca and Vit. D supplementation.  Complete Medication List: 1)  Propranolol Hcl 40 Mg Tabs (Propranolol hcl) .Marland Kitchen.. 1 tab by mouth daily 2)  Sprintec 28 0.25-35 Mg-mcg Tabs (Norgestimate-eth estradiol) .Marland Kitchen.. 1 tab by mouth daily as directed. dispense: 3 months 3)  Celebrex 100 Mg Caps (Celecoxib) .Marland Kitchen.. 1 by mouth two times a day as needed 4)  Flonase 50 Mcg/act Susp (Fluticasone propionate) .... 2 sprays each nostril daily 5)  Topamax 50 Mg Tabs (Topiramate) .Marland Kitchen.. 1 by mouth bid 6)  Cetirizine Hcl 10  Mg Tabs (Cetirizine hcl) .Marland Kitchen.. 1 tab by mouth daily 7)  Nystop 100000 Unit/gm Powd (Nystatin) .... Apply to affected area twice a day as needed disp: 60 gram 8)  Metronidazole 0.75 % Gel (Metronidazole) .... One applicator full at bedtime for 5 nights., qs  Patient Instructions: 1)  Return in 1 year for annual exam- will do pap smear at that time.  You have had 3 negative pap smears in the past 3 years. 2)  Take Calcium 1000-1200mg  with Vit D 400mg  daily.  You can by this over the counter without a prescription. 3)  Continue your excellent work with diet and exercise.   4)  Increase vegtable and fruit intake to 5 per day.  5)  Will test your blood for cholesterol, blood glucose level, and HIV, and syphilis today.   Prevention & Chronic Care Immunizations   Influenza vaccine: Not documented    Tetanus booster: 11/23/2003: Done.    Pneumococcal vaccine: Not documented  Other Screening   Pap smear: NEGATIVE FOR INTRAEPITHELIAL LESIONS OR MALIGNANCY.   (05/11/2009)   Smoking status: quit  (05/08/2010)  Lipids   Total Cholesterol: 170  (05/06/2008)   LDL: 83  (05/06/2008)   LDL Direct: Not documented   HDL: 66  (05/06/2008)   Triglycerides: 103  (05/06/2008)  Hypertension   Last Blood Pressure: 128 / 86  (05/08/2010)   Serum creatinine: 0.86  (05/11/2009)   Serum potassium 3.9  (05/11/2009)  Self-Management Support :    Hypertension self-management support: Not documented

## 2010-11-23 NOTE — Progress Notes (Signed)
Summary: triage  Phone Note Call from Patient Call back at Home Phone [phone]   Caller: Patient Summary of Call: Pt has questions about taking the pill and her period coming on a week early.   Initial call taken by: Clydell Hakim,  October 25, 2009 11:02 AM  Follow-up for Phone Call        uses OCPs to regulate her menses.  started period yesterday. cramping today but no bleeding today. still on the blue pills. starts the white tabs next week. missed one pill when she first got it.  concerned because this is not the time for her period. told her sometimes periods vary. will send this to pcp & call her if further instructions. told her to mark bleeding on a calender for md Follow-up by: Golden Circle RN,  October 25, 2009 11:03 AM  Additional Follow-up for Phone Call Additional follow up Details #1::        Possibly related to missed pill.  Would not worry too much over one time but continue to mark calendar.  If occurs early again, come in.  She may bleed again though when starting the placebo pills.  She could skip the placebo pills this month and just start a new pack right away.  If needs early refill sent I would be happy to do this.  Additional Follow-up by: Lamar Laundry, MD October 25, 2008 13:40PM    Additional Follow-up for Phone Call Additional follow up Details #2::    gave her above advise per pcp. she verbalized understanding Follow-up by: Golden Circle RN,  October 25, 2009 1:56 PM

## 2010-11-23 NOTE — Progress Notes (Signed)
Summary: called pt/re: labs/ts  ---- Converted from flag ---- ---- 12/14/2009 8:30 AM, Luretha Murphy NP wrote: Please call her and tell her labs were negative, thanks ------------------------------ called pt and informed of negative tests.

## 2010-11-23 NOTE — Assessment & Plan Note (Signed)
Summary: std ck,tcb   Vital Signs:  Patient profile:   40 year old female Weight:      199.3 pounds Pulse rate:   70 / minute BP sitting:   130 / 78  (left arm) Cuff size:   large  Vitals Entered By: Arlyss Repress CMA, (December 13, 2009 3:04 PM) CC: pt request STD check. condom came off during intercourse. pt is on birth controll pills. had BTL.  Is Patient Diabetic? No Pain Assessment Patient in pain? no        Primary Care Provider:  Ardeen Garland  MD  CC:  pt request STD check. condom came off during intercourse. pt is on birth controll pills. had BTL. Marland Kitchen  History of Present Illness: 2 partners in past year, partner said condom fell off and she wants to be checked.  Denies any lesions, or pain, denies increase in discharge.  Last HIV test was 7/10.  Habits & Providers  Alcohol-Tobacco-Diet     Tobacco Status: quit > 6 months  Allergies: No Known Drug Allergies  Social History: Smoking Status:  quit > 6 months  Physical Exam  General:  Well-developed,well-nourished,in no acute distress; alert,appropriate and cooperative throughout examination Genitalia:  Normal introitus for age, no external lesions, no vaginal discharge, mucosa pink and moist, no vaginal or cervical lesions, no vaginal atrophy, no friaility or hemorrhage, normal uterus size and position, no adnexal masses or tenderness   Impression & Recommendations:  Problem # 1:  BACTERIAL VAGINITIS (ICD-616.10) Metrogel Orders: [facility]- Est Level  3 ([address])  Problem # 2:  CONTACT OR EXPOSURE TO OTHER VIRAL DISEASES (ICD-V01.79) Counseled on safe sex Orders: GC/Chlamydia-[facility] (87591/[id number]) Wet Prep- [facility] (60[address]) [facility]- Est Level  3 ([address]11)  Complete Medication List: 1)  Flexeril 5 Mg Tabs (Cyclobenzaprine hcl) .Marland Kitchen.. 1 tablet by mouth three times a day 2)  Propranolol Hcl 40 Mg Tabs (Propranolol hcl) .Marland Kitchen.. 1 tab by mouth daily 3)  Imitrex 25 Mg Tabs (Sumatriptan succinate) .... Take 1 tablet by mouth 4)   Sprintec 28 0.25-35 Mg-mcg Tabs (Norgestimate-eth estradiol) .Marland Kitchen.. 1 tab by mouth daily as directed. dispense: 3 months 5)  Celebrex 100 Mg Caps (Celecoxib) .Marland Kitchen.. 1 by mouth two times a day as needed 6)  Hydrochlorothiazide 12.5 Mg Tabs (Hydrochlorothiazide) .... Take 1 tab  by mouth every morning 7)  Flonase 50 Mcg/act Susp (Fluticasone propionate) .... 2 sprays each nostril daily 8)  Topamax 50 Mg Tabs (Topiramate) .Marland Kitchen.. 1 by mouth bid 9)  Cetirizine Hcl 10 Mg Tabs (Cetirizine hcl) .Marland Kitchen.. 1 tab by mouth daily 10)  Nystop 100000 Unit/gm Powd (Nystatin) .... Apply to affected area twice a day as needed disp: 60 gram 11)  Metronidazole 0.75 % Gel (Metronidazole) .... One applicator full at bedtime for 5 nights., qs  Patient Instructions: 1)  If you could be exposed to sexually transmitted diseases. you should use a condom.  Prescriptions: METRONIDAZOLE 0.75 % GEL (METRONIDAZOLE) one applicator full at bedtime for 5 nights., QS Brand medically necessary #1 x 0   Entered and Authorized by:   Luretha Murphy NP   Signed by:   Luretha Murphy NP on 12/13/2009   Method used:   Print then Give to Patient   RxID:   [id number]   Laboratory Results   Urine Tests  Date/Time Received: December 13, 2009 3:30 PM  Date/Time Reported: December 13, 2009 4:48 PM     Date/Time Received: December 13, 2009 3:30 PM  Date/Time Reported: December 13, 2009 4:48  PM   Wet [place] Source: vag WBC/hpf: none Bacteria/hpf: 2+  Cocci Clue cells/hpf: many  Negative whiff Yeast/hpf: none Trichomonas/hpf: none Comments: .......test performed by........Marland Kitchen [place], SMA

## 2010-11-23 NOTE — Miscellaneous (Signed)
  Clinical Lists Changes  Problems: Removed problem of BACTERIAL VAGINITIS (ICD-616.10) Removed problem of VAGINAL DISCHARGE (ICD-623.5) Removed problem of UNSPECIFIED VAGINITIS AND VULVOVAGINITIS (ICD-616.10) Removed problem of CONTACT OR EXPOSURE TO OTHER VIRAL DISEASES (ICD-V01.79) Removed problem of SCREENING FOR MALIGNANT NEOPLASM OF THE CERVIX (ICD-V76.2)      Allergies: No Known Drug Allergies

## 2010-11-23 NOTE — Progress Notes (Signed)
Summary: meds prob  Phone Note Call from Patient Call back at [phone]   Caller: Patient Summary of Call: needs new rx for the Topamax not generic for [facility] to pay [facility] [address] call pt to let her know Initial call taken by: De Nurse,  October 06, 2010 4:01 PM    Prescriptions: TOPAMAX 50 MG TABS (TOPIRAMATE) 1 by mouth bid  #60 Each x 0   Entered and Authorized by:   Ellin Mayhew MD   Signed by:   Ellin Mayhew MD on 10/09/2010   Method used:   Electronically to        [facility] [phone]* (retail)       [address]       [place], [place]  [address]       Ph: [phone]       Fax: [phone]   RxID:   [id number]  Rx for topamax has been sent to [facility] [address]  Will ask office staff to call pt to let her know that I sent Rx to the pharmacy as she requested. Ellin Mayhew MD  October 09, 2010 9:11 PM

## 2010-11-23 NOTE — Progress Notes (Signed)
Summary: med prob  Phone Note Refill Request Call back at [facility] Phone [phone] Message from:  Patient  Refills Requested: Medication #1:  SPRINTEC 28 0.25-35 MG-MCG TABS 1 tab by mouth daily as directed. dispense: 3 months   Notes: pt is out [facility] [address] let pt know  Initial call taken by: De Nurse,  June 12, 2010 8:57 AM  Follow-up for Phone Call       Follow-up by: Golden Circle RN,  June 12, 2010 9:01 AM    Prescriptions: SPRINTEC 28 0.25-35 MG-MCG TABS (NORGESTIMATE-ETH ESTRADIOL) 1 tab by mouth daily as directed. dispense: 3 months  #3 x 3   Entered by:   Golden Circle RN   Authorized by:   Ellin Mayhew MD   Signed by:   Golden Circle RN on 06/12/2010   Method used:   Electronically to        [facility] [phone]* (retail)       [address]       [place], [place]  [address]       Ph: [phone]       Fax: [phone]   RxID:   [id number]

## 2010-11-29 NOTE — Progress Notes (Signed)
  Phone Note Refill Request Call back at Home Phone [phone] Message from:  Patient  Refills Requested: Medication #1:  PROPRANOLOL HCL 40 MG  TABS 1 tab by mouth daily pt is now out  Initial call taken by: De Nurse,  November 23, 2010 12:27 PM  Follow-up for Phone Call        medication refill form completed and placed in the fax pile to be [facility].  Called pt and let her know that refill was complete. Could not find why pt was on cerebrex in past- pt states that she has leg pain/knee pain and uses only as needed.  Will discuss in more detail at f/up appt.  will give rx refill x 3 for now.  Ellin Mayhew MD  November 23, 2010 1:36 PM

## 2010-12-13 ENCOUNTER — Encounter: Payer: Self-pay | Admitting: Family Medicine

## 2010-12-13 ENCOUNTER — Ambulatory Visit (INDEPENDENT_AMBULATORY_CARE_PROVIDER_SITE_OTHER): Payer: Self-pay | Admitting: Family Medicine

## 2010-12-13 VITALS — BP 115/84 | HR 142 | Temp 99.0°F | Ht 61.75 in | Wt 181.8 lb

## 2010-12-13 DIAGNOSIS — R5383 Other fatigue: Secondary | ICD-10-CM | POA: Insufficient documentation

## 2010-12-13 DIAGNOSIS — I10 Essential (primary) hypertension: Secondary | ICD-10-CM

## 2010-12-13 DIAGNOSIS — R82998 Other abnormal findings in urine: Secondary | ICD-10-CM

## 2010-12-13 LAB — CBC
HCT: 38.7 % (ref 36.0–46.0)
Hemoglobin: 12.6 g/dL (ref 12.0–15.0)
MCH: 29.7 pg (ref 26.0–34.0)
MCHC: 32.6 g/dL (ref 30.0–36.0)
MCV: 91.3 fL (ref 78.0–100.0)
Platelets: 228 10*3/uL (ref 150–400)
RBC: 4.24 MIL/uL (ref 3.87–5.11)
WBC: 5.2 10*3/uL (ref 4.0–10.5)

## 2010-12-13 LAB — CONVERTED CEMR LAB
HCT: 38.7 % (ref 36.0–46.0)
Hemoglobin: 12.6 g/dL (ref 12.0–15.0)
MCHC: 32.6 g/dL (ref 30.0–36.0)
MCV: 91.3 fL (ref 78.0–100.0)
Platelets: 228 10*3/uL (ref 150–400)
RBC: 4.24 M/uL (ref 3.87–5.11)
RDW: 13.1 % (ref 11.5–15.5)
TSH: 0.826 microintl units/mL (ref 0.350–4.500)
WBC: 5.2 10*3/uL (ref 4.0–10.5)

## 2010-12-13 LAB — TSH: TSH: 0.826 u[IU]/mL (ref 0.350–4.500)

## 2010-12-13 NOTE — Patient Instructions (Addendum)
Drink more water- this should help your dark colored urine.  If any fever, burning when you urinate or increased urination please return. I think most likely your fatigue is due to lack of quality and quantity of sleep. Please review sleep handout and see if changes in your routine help your fatigue. I am going to also check your thyroid and I am also going to check for anemia.  I will either send a letter or call you with results.  Return if symptoms do not improve.  Keep log book of right chest symptoms: how often,  How long it lasts, any other symptoms, what makes it better or worse.  Hopefully these will go away on their own.  If they don't in the next few weeks or get worse come back so we can discuss in more detail.

## 2010-12-13 NOTE — Progress Notes (Signed)
  Subjective:    Patient ID: Barbara Dunn, female    DOB: 1971-02-07, 40 y.o.   MRN: [id number]  HPI  Urine cloudy- Cloudy in color, off and on, no burning, no frequency, no retention. Pt states "I don't drink water like I should."  Drinks about 2 bottles of water max per day.  No fever.   Feeling tired- X 2months, was off and on, now daily having problems with fatigue.  Still able to work and do daily tasks. Goes to bed 12 midnight to 1am.  Gets up at 6:30am-7:30. Usually watches tv prior to going to bed. Has tv in bedroom. No fever.  No cold symptoms.  No n/v/d.  Good appetite.  No weight loss. Some thinning of hair x 1 year.  No heat or cold intoleratnce.  No anxiety, no palpitations. Right breast pain, shooting pain, lasts 3 min then resolve, no sob.  Had to go to hospital last year for this-- told her heart was "fine".    Has heavy periods x 7 days.  Every month with bcp.       Review of Systems As per hpi    Objective:   Physical Exam  Constitutional: She is oriented to person, place, and time. She appears well-developed and well-nourished.  HENT:  Head: Normocephalic and atraumatic.  Eyes: EOM are normal. Pupils are equal, round, and reactive to light.  Neck: No thyromegaly present.  Cardiovascular: Normal rate, regular rhythm and normal heart sounds.   Pulmonary/Chest: Effort normal and breath sounds normal. She has no wheezes.  Abdominal: Soft. Bowel sounds are normal. She exhibits no distension. There is no tenderness. There is no rebound and no guarding.  Musculoskeletal: She exhibits no edema.  Neurological: She is alert and oriented to person, place, and time.  Skin: Skin is warm and dry.          Assessment & Plan:

## 2010-12-14 ENCOUNTER — Encounter: Payer: Self-pay | Admitting: Family Medicine

## 2010-12-14 DIAGNOSIS — R82998 Other abnormal findings in urine: Secondary | ICD-10-CM | POA: Insufficient documentation

## 2010-12-14 NOTE — Assessment & Plan Note (Signed)
Most likely 2/2 lack of po fluid intake.  Pt to increase water intake throughout day and monitor to ensure it improves.  Pt to return if any s/s of infection.

## 2010-12-14 NOTE — Assessment & Plan Note (Signed)
Will order TSH to r/o thyroid issue.  Will check for anemia.  Most likely fatigue is 2/2 to poor sleep quality and quantity- sleep hygiene info given to pt.  Pt to return if fatigue not improved with sleep improvement.

## 2010-12-14 NOTE — Assessment & Plan Note (Signed)
Pt bp wnl.  Will continue current regimen

## 2011-01-07 ENCOUNTER — Other Ambulatory Visit: Payer: Self-pay | Admitting: Family Medicine

## 2011-01-07 MED ORDER — FLUTICASONE PROPIONATE 50 MCG/ACT NA SUSP: 2.0000 | Freq: Every day | NASAL | Status: DC

## 2011-01-09 LAB — URINALYSIS, ROUTINE W REFLEX MICROSCOPIC
Bilirubin Urine: NEGATIVE
Glucose, UA: NEGATIVE mg/dL
Ketones, ur: 15 mg/dL — AB
Nitrite: NEGATIVE
pH: 6.5 (ref 5.0–8.0)

## 2011-01-09 LAB — POCT CARDIAC MARKERS
CKMB, poc: <1 ng/mL — ABNORMAL LOW (ref 1.0–8.0)
Myoglobin, poc: 51.7 ng/mL (ref 12–200)
Myoglobin, poc: 79.6 ng/mL (ref 12–200)
Troponin i, poc: <0.05 ng/mL (ref 0.00–0.09)

## 2011-01-09 LAB — CBC
HCT: 37.7 % (ref 36.0–46.0)
Hemoglobin: 12.8 g/dL (ref 12.0–15.0)
MCHC: 33.9 g/dL (ref 30.0–36.0)
MCV: 90.6 fL (ref 78.0–100.0)
Platelets: 231 10*3/uL (ref 150–400)
RBC: 4.16 MIL/uL (ref 3.87–5.11)
RDW: 12.8 % (ref 11.5–15.5)
WBC: 12.1 10*3/uL — ABNORMAL HIGH (ref 4.0–10.5)

## 2011-01-09 LAB — D-DIMER, QUANTITATIVE: D-Dimer, Quant: 0.7 ug/mL-FEU — ABNORMAL HIGH (ref 0.00–0.48)

## 2011-01-09 LAB — DIFFERENTIAL
Basophils Absolute: 0 10*3/uL (ref 0.0–0.1)
Basophils Relative: 0 % (ref 0–1)
Eosinophils Absolute: 0.1 10*3/uL (ref 0.0–0.7)
Eosinophils Relative: 0 % (ref 0–5)
Lymphocytes Relative: 8 % — ABNORMAL LOW (ref 12–46)
Lymphs Abs: 1 10*3/uL (ref 0.7–4.0)
Monocytes Absolute: 0.5 10*3/uL (ref 0.1–1.0)
Monocytes Relative: 4 % (ref 3–12)
Neutro Abs: 10.5 10*3/uL — ABNORMAL HIGH (ref 1.7–7.7)
Neutrophils Relative %: 87 % — ABNORMAL HIGH (ref 43–77)

## 2011-01-09 LAB — BASIC METABOLIC PANEL
BUN: 6 mg/dL (ref 6–23)
CO2: 23 mEq/L (ref 19–32)
Calcium: 8.5 mg/dL (ref 8.4–10.5)
Chloride: 104 mEq/L (ref 96–112)
Creatinine, Ser: 0.87 mg/dL (ref 0.4–1.2)
GFR calc Af Amer: >60 mL/min (ref >60)
GFR calc non Af Amer: >60 mL/min (ref >60)
Glucose, Bld: 89 mg/dL (ref 70–99)
Potassium: 3.3 mEq/L — ABNORMAL LOW (ref 3.5–5.1)
Sodium: 133 mEq/L — ABNORMAL LOW (ref 135–145)

## 2011-02-15 ENCOUNTER — Telehealth: Payer: Self-pay | Admitting: Family Medicine

## 2011-02-15 NOTE — Telephone Encounter (Signed)
Barbara Dunn is requesting refill for her Topomax, but due to her insurance need to have written for name brand and not generic.  Please send to [facility] on [address].

## 2011-02-16 ENCOUNTER — Other Ambulatory Visit: Payer: Self-pay | Admitting: Family Medicine

## 2011-02-16 MED ORDER — TOPIRAMATE 50 MG PO TABS: 50.0000 mg | ORAL_TABLET | Freq: Two times a day (BID) | ORAL | Status: DC

## 2011-02-16 NOTE — Telephone Encounter (Signed)
rx sent to pharmacy.  Will ask admin staff to notify pt that this is complete.

## 2011-03-06 ENCOUNTER — Telehealth: Payer: Self-pay | Admitting: *Deleted

## 2011-03-06 NOTE — Telephone Encounter (Signed)
Patient calls stating she is on Sprintec for control of irregular bleeding, not for birth control.  She forgot to take pills for 3 days and took 2 daily for 3 days. Now she is back on schedule in her pack. She has started with vaginal bleeding like a regular period . This has started a week early. Explained to patient this is due to break through bleeding because she missed pills. Advised to continue pills in pack as usual and then start on new pack when this pack is finished.

## 2011-03-09 NOTE — Discharge Summary (Signed)
NAME:  Barbara Dunn, Barbara Dunn                             ACCOUNT NO.:  [id number]   MEDICAL RECORD NO.:  [id number]                   PATIENT TYPE:  INP   LOCATION:  5736                                 FACILITY:  [facility]   PHYSICIAN:  Lawerance Sabal, MD                   DATE OF BIRTH:  Apr 26, 1971   DATE OF ADMISSION:  08/23/2003  DATE OF DISCHARGE:  08/25/2003                                 DISCHARGE SUMMARY   DISCHARGE DIAGNOSES:  Viral pharyngitis, resolving.   DISCHARGE MEDICATIONS:  1. Acetaminophen 600 mg every four to six hours for fever and pain.  2. Chloraseptic spray one spray as necessary.   DISCHARGE INSTRUCTIONS:  Follow up with Dr. Rodolph Bong on September 06, 2003, at 10:35 a.m.   REVIEW OF HISTORY:  This is a 40 year old African American female with a two  week history of sore throat and difficulty swallowing.  She was seen at the  [facility] and [facility].  Strep test done was negative on two occasions.  She presented  with continued progression of her symptoms and decreasing oral intake.  She  was noted to have a weight loss during the past week.  She was unable to  swallow saliva for two days.  No respiratory difficulty.   PHYSICAL EXAMINATION:  On admission:  VITAL SIGNS:  BP 134/87, heart rate 101, RR 20, temperature of 99.3, O2 sat  of 99% on room air, weight of 180.3.  GENERAL:  Awake, oriented, not in distress.  HEENT:  PERRLA.  EOMs intact.  Tympanic membranes clear.  Dry mucous  membranes with drooling of saliva.  Difficulty in opening the mouth.  Oropharynx mildly swollen and erythematous.  No exudate.  No ulcers noted.  NECK:  No cervical lymphadenopathy.  Supple neck.  RESPIRATORY:  Clear to auscultation bilaterally.  No rales or wheezes.  CVS:  slightly tachycardic.  No murmurs.  ABDOMEN:  Positive bowel sounds.  Soft, nontender.  No masses palpated.  NEUROLOGIC:  Alert and oriented  x 3.  Cranial nerves II-XII intact.  Motor  5/5 in both upper and lower  extremities.   LABS ON ADMISSION:  Strep test was negative.  Cultures pending.   COURSE IN THE HOSPITAL:  Problem #1.  Viral pharyngitis.  Plain film of the  neck was obtained to look for soft tissue swelling.  The results of the  plain film of the neck showed normal __________ .  The patient remained  afebrile during her hospital stay, and her difficulty of swallowing  improved.  She did not present with any more vomiting.  Prior to discharge,  the patient was able to tolerate liquid and soft diet.   Problem #2.  Dehydration.  The patient was maintained on IV fluids.  She was  able to tolerate p.o. intake on the second hospital day.    CONDITION ON DISCHARGE:  The patient was discharged with stable vital signs  with no fever for the past 12 hours prior to discharge.  The patient  reported improvement of her symptoms.  She was able to tolerate feeding.                                                Lawerance Sabal, MD    MC/MEDQ  D:  08/28/2003  T:  08/29/2003  Job:  [id number]   cc:   Rodolph Bong, M.D.  [address] [place], [place] [address]  Fax: [phone]

## 2011-03-26 ENCOUNTER — Other Ambulatory Visit: Payer: Self-pay | Admitting: Family Medicine

## 2011-03-26 MED ORDER — CELECOXIB 100 MG PO CAPS: 100.0000 mg | ORAL_CAPSULE | Freq: Two times a day (BID) | ORAL | Status: DC | PRN

## 2011-05-10 ENCOUNTER — Encounter: Payer: Self-pay | Admitting: Family Medicine

## 2011-05-10 ENCOUNTER — Ambulatory Visit (INDEPENDENT_AMBULATORY_CARE_PROVIDER_SITE_OTHER): Payer: Self-pay | Admitting: Family Medicine

## 2011-05-10 VITALS — BP 130/80 | HR 72 | Ht 63.5 in | Wt 185.0 lb

## 2011-05-10 DIAGNOSIS — I10 Essential (primary) hypertension: Secondary | ICD-10-CM

## 2011-05-10 DIAGNOSIS — Z202 Contact with and (suspected) exposure to infections with a predominantly sexual mode of transmission: Secondary | ICD-10-CM

## 2011-05-10 DIAGNOSIS — Z124 Encounter for screening for malignant neoplasm of cervix: Secondary | ICD-10-CM

## 2011-05-10 LAB — POCT WET PREP (WET MOUNT)
Trichomonas Wet Prep HPF POC: NEGATIVE
Yeast Wet Prep HPF POC: NEGATIVE

## 2011-05-10 MED ORDER — NORGESTIMATE-ETH ESTRADIOL 0.25-35 MG-MCG PO TABS: 1.0000 | ORAL_TABLET | Freq: Every day | ORAL | Status: DC

## 2011-05-10 NOTE — Assessment & Plan Note (Signed)
Pt unsure if partner is monogamous, requesting screening.  HIV/RPR/ GC/Chalmydia and wet prep to look for trich obtained at 04/2011 appointment.

## 2011-05-10 NOTE — Progress Notes (Signed)
  Subjective:    Patient ID: Barbara Dunn, female    DOB: February 11, 1971, 40 y.o.   MRN: [id number]  HPI Pt here for pap smear: States that she has not had a pap in the past 2 years.  No history of abnormal pap. Has 1 sexual partner x 1 year.  Unsure if sexual partner is monogamous.  Would like to be screened for std's.  BP: Has not taken bp medication (propanolol) x 3 months.  bp today 130/80.    Review of Systems No fever. No weight change. No lumps/bumps/or areas of concern on breast.  No cp. No sob.     Objective:   Physical Exam  Constitutional: She is oriented to person, place, and time. She appears well-developed and well-nourished.  HENT:  Head: Normocephalic and atraumatic.  Cardiovascular: Normal rate and regular rhythm.   No murmur heard. Pulmonary/Chest: Effort normal. No respiratory distress. She has no wheezes.  Abdominal: Soft. She exhibits no distension. There is no tenderness. There is no rebound.  Genitourinary: Vagina normal. There is no rash or lesion on the right labia. There is no rash or lesion on the left labia. Cervix exhibits no motion tenderness. Right adnexum displays no mass, no tenderness and no fullness. Left adnexum displays no mass, no tenderness and no fullness.  Neurological: She is alert and oriented to person, place, and time.  Skin: No rash noted.  Psychiatric: She has a normal mood and affect. Her behavior is normal.  + blood in vaginal vault (pt at end of menstrual cycle)        Assessment & Plan:

## 2011-05-10 NOTE — Patient Instructions (Addendum)
I will send you the results in the mail. Return in 2 weeks for a nurse check to recheck blood pressure. Also try to keep a blood pressure log. Return to see me in 3 months for blood pressure recheck.

## 2011-05-10 NOTE — Progress Notes (Signed)
Addended by: Kristen Cardinal on: 05/10/2011 01:57 PM   Modules accepted: Orders

## 2011-05-10 NOTE — Assessment & Plan Note (Signed)
Pap smear obtained at 04/2011 visit.  Awaiting results.

## 2011-05-10 NOTE — Assessment & Plan Note (Signed)
Pt has not taken bp medication x 3 months.  Will not restart at this time.  bp 130/80 at today's visit.  Pt to return in 2 weeks for nurse bp check.  Pt is to keep bp logbook.  Pt is to return for f/up visit for bp in 3 months or sooner if any elevations.

## 2011-05-11 ENCOUNTER — Encounter: Payer: Self-pay | Admitting: Family Medicine

## 2011-05-11 LAB — GC/CHLAMYDIA PROBE AMP, GENITAL
Chlamydia, DNA Probe: NEGATIVE
GC Probe Amp, Genital: NEGATIVE

## 2011-05-24 ENCOUNTER — Ambulatory Visit (INDEPENDENT_AMBULATORY_CARE_PROVIDER_SITE_OTHER): Payer: Self-pay | Admitting: *Deleted

## 2011-05-24 VITALS — BP 130/82 | HR 80

## 2011-05-24 DIAGNOSIS — I10 Essential (primary) hypertension: Secondary | ICD-10-CM

## 2011-05-24 NOTE — Progress Notes (Signed)
Patient was instructed at last visit to keep a log of  BP readings for 3 months . She is currently off BP medication. BP checked manually using large adult cuff. BP 130/82 LA pulse 80.

## 2011-07-03 ENCOUNTER — Telehealth: Payer: Self-pay | Admitting: Family Medicine

## 2011-07-03 NOTE — Telephone Encounter (Addendum)
Barbara Dunn is waiting for the form that was sent from [facility] to be completed and faxed back so that she can continue to get her meds.  In the meantime she need to have a call made to [facility] on [address] for a refill on  the generic of Topomax 50 mg.  She will wait until the [facility] receives the information from office on the others.   Addendum:   No longer need form for [facility].  Just need rx sent to [facility] for generic of Topomax 50 mg

## 2011-07-04 NOTE — Telephone Encounter (Signed)
Pt is Calling again about needing her topamax

## 2011-07-05 ENCOUNTER — Other Ambulatory Visit: Payer: Self-pay | Admitting: Family Medicine

## 2011-07-05 MED ORDER — TOPIRAMATE 50 MG PO TABS: 50.0000 mg | ORAL_TABLET | Freq: Two times a day (BID) | ORAL | Status: DC

## 2011-07-05 NOTE — Telephone Encounter (Signed)
rx sent to [facility].

## 2011-07-05 NOTE — Telephone Encounter (Signed)
Pt has been out of her Topamax and needs is asap.  pls advise

## 2011-08-01 ENCOUNTER — Emergency Department (HOSPITAL_COMMUNITY)
Admission: EM | Admit: 2011-08-01 | Discharge: 2011-08-01 | Disposition: A | Discharge status: Home / Self Care | Payer: Self-pay | Attending: Emergency Medicine | Admitting: Emergency Medicine

## 2011-08-01 DIAGNOSIS — Z79899 Other long term (current) drug therapy: Secondary | ICD-10-CM | POA: Insufficient documentation

## 2011-08-01 DIAGNOSIS — I10 Essential (primary) hypertension: Secondary | ICD-10-CM | POA: Insufficient documentation

## 2011-08-01 DIAGNOSIS — M129 Arthropathy, unspecified: Secondary | ICD-10-CM | POA: Insufficient documentation

## 2011-08-01 DIAGNOSIS — R51 Headache: Secondary | ICD-10-CM | POA: Insufficient documentation

## 2011-08-07 ENCOUNTER — Encounter: Payer: Self-pay | Admitting: Family Medicine

## 2011-08-07 ENCOUNTER — Ambulatory Visit (INDEPENDENT_AMBULATORY_CARE_PROVIDER_SITE_OTHER): Payer: Self-pay | Admitting: Family Medicine

## 2011-08-07 DIAGNOSIS — R51 Headache: Secondary | ICD-10-CM

## 2011-08-07 NOTE — Progress Notes (Signed)
  Subjective:    Patient ID: Barbara Dunn, female    DOB: May 03, 1971, 40 y.o.   MRN: [id number]  HPI Headaches: Patient reports long history of headaches, in the past would have headaches 1-2 times per month, had a remote diagnosis of migraines-but states she hasn't had any problems with this in a long time. During the past 2 months has had worsening of headaches. These headaches occur every 2 weeks, and last 2-3 days. Patient has dizziness with headaches, positive noise intolerance, pain is all over had, headache is described as severe, no pulsating characteristic, not worse with activity, no photophobia, no nausea or vomiting, patient able to go work with headache. Patient denies aura. Patient only sleeps 5 hours per night-because she states she is too busy to sleep longer. Able to fall asleep and stay asleep. Drinks probably for glasses of water per day. Went to the ER last week for severe headache. Was told to followup with neurologist. Unable to afford appointment because paying out of pocket. Came here for followup instead. Patient takes Motrin or Tylenol for headache. States she does not take this daily. Only every 2 weeks the headache occurs.   Review of Systems    as per above. Objective:   Physical Exam  Constitutional: She is oriented to person, place, and time. She appears well-developed and well-nourished.  HENT:  Head: Normocephalic and atraumatic.  Cardiovascular: Normal rate, regular rhythm and normal heart sounds.   No murmur heard. Pulmonary/Chest: Effort normal. No respiratory distress. She has no wheezes.  Abdominal: Soft.  Neurological: She is alert and oriented to person, place, and time.       Cranial nerve II through XII intact. Strength equal bilateral in all 4 extremities. Sensation intact bilateral. Reflexes 2+ and equal bilateral. Normal gait. Pupils equal round react to light and accommodation.  Skin: No rash noted.  Psychiatric: She has a normal mood and affect. Her  behavior is normal.          Assessment & Plan:

## 2011-08-07 NOTE — Patient Instructions (Addendum)
Drink at least 4 plastic bottles of water per day.  Try to get 8 hours of sleep.  Keep a log book of your headaches, how long they last, what seems to have triggered it, what makes it better or worse, or any other symptoms (nausea, vomiting, one side of head or both, light sensitivity, noise sensitivity) Return in 1 month for follow up on headaches.

## 2011-08-08 NOTE — Assessment & Plan Note (Signed)
Remote history of migraines, current description does not meet criteria for migraine headache. Patient to keep logbook of headaches, duration, and associated symptoms and return in one month for reeval. Patient to increase number of hours of sleep at night. Patient to drink more water. The patient to work on stress reduction. Use NSAIDs only for severe headaches. Return one month for followup. If it is more clear in one month that migraine is the diagnosis then can start migraine medication treatment. Physical exam reassuring/within normal limits.

## 2011-08-13 ENCOUNTER — Ambulatory Visit: Payer: Self-pay | Admitting: Family Medicine

## 2011-09-11 ENCOUNTER — Ambulatory Visit (INDEPENDENT_AMBULATORY_CARE_PROVIDER_SITE_OTHER): Payer: Self-pay | Admitting: Family Medicine

## 2011-09-11 ENCOUNTER — Encounter: Payer: Self-pay | Admitting: Family Medicine

## 2011-09-11 VITALS — BP 122/80 | HR 79 | Ht 63.5 in | Wt 195.0 lb

## 2011-09-11 DIAGNOSIS — Z719 Counseling, unspecified: Secondary | ICD-10-CM | POA: Insufficient documentation

## 2011-09-11 DIAGNOSIS — Z833 Family history of diabetes mellitus: Secondary | ICD-10-CM

## 2011-09-11 DIAGNOSIS — I10 Essential (primary) hypertension: Secondary | ICD-10-CM

## 2011-09-11 DIAGNOSIS — G43009 Migraine without aura, not intractable, without status migrainosus: Secondary | ICD-10-CM

## 2011-09-11 DIAGNOSIS — Z7189 Other specified counseling: Secondary | ICD-10-CM

## 2011-09-11 MED ORDER — SUMATRIPTAN SUCCINATE 50 MG PO TABS: 50.0000 mg | ORAL_TABLET | Freq: Once | ORAL | Status: DC

## 2011-09-11 NOTE — Assessment & Plan Note (Signed)
Patient requesting A1c screening since family history. We'll screening today's appointment.

## 2011-09-11 NOTE — Assessment & Plan Note (Addendum)
We'll continue patient on Topamax for prophylaxis. We'll also add Imitrex to be used at first sign of migraine.  Headaches most likely worsened by lack of water intake and by lack of quality sleep.  Gave patient handout on sleep hygiene again today. Also encouraged patient to increase water intake.

## 2011-09-11 NOTE — Progress Notes (Signed)
  Subjective:    Patient ID: Barbara Dunn, female    DOB: 09-Aug-1971, 40 y.o.   MRN: [id number]  HPI Headaches followup: Patient reports during past month has had multiple small headaches but did not bother her. Did not take medication. But did have 3 moderate to severe headaches. Each of them lasted one day. Took ibuprofen and Vicodin to control pain. Headache pain was "all over." Described as a throbbing. Positive noise sensitivity. Did have one headache that started with an aura-bright star pattern in vision. Usually does not have this. No nausea. No vomiting. Able to go to work even with headaches. Does have a previous diagnosis of migraine. Still not drinking much water during the day-only approximately 4 glasses of water. Still not sleeping well. Did not review sleep hygiene.  Hypertension: Has Blood pressure log book at home. Almost all values less than 130/80. Has not taken blood pressure medication in 6 months. Would like to know if she can discontinue this completely.  Family history of diabetes: Patient states that she would like A1c screen today since she has a family history of diabetes.  Review of Systems As per above    Objective:   Physical Exam  Constitutional: She is oriented to person, place, and time. She appears well-developed and well-nourished.  HENT:  Head: Normocephalic and atraumatic.  Cardiovascular: Normal rate.   Pulmonary/Chest: Effort normal. No respiratory distress.  Abdominal: Soft. She exhibits no distension.  Musculoskeletal: She exhibits no edema.  Neurological: She is alert and oriented to person, place, and time. She has normal reflexes. She displays normal reflexes. No cranial nerve deficit. She exhibits normal muscle tone. Coordination normal.       Cranial nerve 2 through 12 intact. Strength 5 out of 5 in all 4 extremities, sensation normal throughout.  Skin: Skin is warm. No rash noted.  Psychiatric: She has a normal mood and affect. Her behavior is  normal.          Assessment & Plan:

## 2011-09-11 NOTE — Patient Instructions (Signed)
Return in 3 months for follow up.  Return sooner if new or worsening of symptoms.  Take imitrex as directed.   Remember- work on drinking more water- at least 4-5 bottles of water per day. Also read the sleep hygiene sheet-- this is very important to help you get a better nights sleep--which is important to prevent headaches.

## 2011-09-11 NOTE — Assessment & Plan Note (Signed)
blood pressure 122/80-blood pressure log showed that blood pressure consistently less than 130/80 at home-with only occasional elevation. No further medication treatment at this time.

## 2011-10-04 ENCOUNTER — Telehealth: Payer: Self-pay | Admitting: Family Medicine

## 2011-10-04 NOTE — Telephone Encounter (Signed)
Returned call to patient.  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 Last got Rx in January and has used it twice for BV and is now out.  Informed that office visit required before med can be prescribed.  States she is unable to come for office visit due to missed time from work due to office visits for headaches.  Requesting that I check with PCP to see if metrogel can be refilled.  Will route to PCP and call patient back.  Gaylene Brooks, RN

## 2011-10-04 NOTE — Telephone Encounter (Signed)
Pt is asking for her metrogel b/c she has another fishy smell and knows it is BV.  Doesn't want to come in, can't take anymore time off. Wants to pick up rx to take to pharmacy

## 2011-10-08 NOTE — Telephone Encounter (Signed)
Pt needs to come in for evaluation because there are a few different types of vaginal infections that can present with these symptoms.   I don't want to give her the wrong treatment.  Pt needs to come in for a work-in appointment for evaluation.  Will ask admin staff to please call pt and schedule appointment.

## 2011-10-08 NOTE — Telephone Encounter (Signed)
Called patient to advise of message from MD.  States she know what it is and if doctor will look thru file and prescribe same thing she has gotten in past because she always has same kind of infection. Again advised that she will need to be seen for rx to be prescribed and  offered to schedule appointment. States she cannot take time off from work and hung up phone.

## 2011-10-09 ENCOUNTER — Ambulatory Visit: Payer: Self-pay | Admitting: Family Medicine

## 2011-10-19 ENCOUNTER — Encounter: Payer: Self-pay | Admitting: Family Medicine

## 2011-10-19 ENCOUNTER — Ambulatory Visit (INDEPENDENT_AMBULATORY_CARE_PROVIDER_SITE_OTHER): Payer: Self-pay | Admitting: Family Medicine

## 2011-10-19 DIAGNOSIS — N760 Acute vaginitis: Secondary | ICD-10-CM

## 2011-10-19 DIAGNOSIS — Z202 Contact with and (suspected) exposure to infections with a predominantly sexual mode of transmission: Secondary | ICD-10-CM

## 2011-10-19 DIAGNOSIS — N898 Other specified noninflammatory disorders of vagina: Secondary | ICD-10-CM

## 2011-10-19 LAB — POCT WET PREP (WET MOUNT)
Trichomonas Wet Prep HPF POC: NEGATIVE
Yeast Wet Prep HPF POC: NEGATIVE

## 2011-10-19 NOTE — Patient Instructions (Signed)
I will call you with your results.  Please return as needed

## 2011-10-19 NOTE — Progress Notes (Signed)
  Subjective:    Patient ID: Barbara Dunn, female    DOB: [birth year], 40 y.o.   MRN: [id number]  HPI Vaginal discharge: Patient reports vaginal discharge odor. States the vaginal discharge is unchanged in color or amount. But she has noticed a fishy odor. Would like to be screened for sexually transmitted infections. No fever. No abdominal pain. No vaginal bleeding. No increase in discharge. No color changes in discharge. No nausea. No vomiting. Is sexually active with one partner. Does not use condoms. Uses birth control pills for contraception.   Review of Systems As per above.    Objective:   Physical Exam  Constitutional: She appears well-developed and well-nourished.  Cardiovascular: Normal rate.   Pulmonary/Chest: Effort normal. No respiratory distress.  Genitourinary: Vagina normal and uterus normal. There is no rash, tenderness, lesion or injury on the right labia. There is no rash, tenderness, lesion or injury on the left labia. Cervix exhibits no motion tenderness and no friability. Discharge: scant white/clear vaginal discharge. Right adnexum displays no mass, no tenderness and no fullness. Left adnexum displays no mass, no tenderness and no fullness. No erythema around the vagina. No vaginal discharge found.          Assessment & Plan:

## 2011-10-20 LAB — RPR

## 2011-10-20 LAB — HIV ANTIBODY (ROUTINE TESTING W REFLEX): HIV: NONREACTIVE

## 2011-10-24 ENCOUNTER — Encounter: Payer: Self-pay | Admitting: Family Medicine

## 2011-10-30 NOTE — Assessment & Plan Note (Signed)
Wet prep/ GC/ Chlam all sent to lab.  All results are within normal limits.  Most likely discharge that pt has noticed is normal physiologic vaginal discharge.  Reminded pt to always use condoms.  RPR and HIV also normal.

## 2012-01-28 ENCOUNTER — Ambulatory Visit (INDEPENDENT_AMBULATORY_CARE_PROVIDER_SITE_OTHER): Payer: Self-pay | Admitting: Family Medicine

## 2012-01-28 ENCOUNTER — Encounter: Payer: Self-pay | Admitting: Family Medicine

## 2012-01-28 VITALS — BP 126/88 | HR 76 | Ht 63.0 in | Wt 200.0 lb

## 2012-01-28 DIAGNOSIS — S025XXA Fracture of tooth (traumatic), initial encounter for closed fracture: Secondary | ICD-10-CM

## 2012-01-29 DIAGNOSIS — S025XXA Fracture of tooth (traumatic), initial encounter for closed fracture: Secondary | ICD-10-CM | POA: Insufficient documentation

## 2012-01-29 NOTE — Assessment & Plan Note (Signed)
No signs or symptoms of infection at this time.  Will place referral for [facility].  Pt is aware that there is a long wait list and that she needs to look for other options such as dentist that could possibly set up payment plan for her.

## 2012-01-29 NOTE — Progress Notes (Signed)
  Subjective:    Patient ID: Barbara Dunn, female    DOB: July 15, 1971, 42 y.o.   MRN: [id number]  HPI Broken tooth left upper: Pt reports that she went to her dentist recently for dental exam and cleaning.  Was told that she has a upper left broken tooth and and 2 cavities.  She is requesting referral to [facility] for this dental work.  She states that she can not afford to pay her dentist out of pocket-  She doesn't have insurance.  She denies any tooth pain, gum tenderness, pus or drainage.  No fever.  No chills. No problems with eating.  Pt uses partial- has multiple teeth missing.    Review of Systems As per above    Objective:   Physical Exam  Constitutional: She appears well-developed and well-nourished.  HENT:  Head: Normocephalic and atraumatic.  Mouth/Throat: Oropharynx is clear and moist.       Broken tooth present on left upper side.  No tooth pain on palpation.  No fluctuance of gumline.  No drainage.    Cardiovascular: Normal rate.   Pulmonary/Chest: Effort normal. No respiratory distress.  Lymphadenopathy:    She has no cervical adenopathy.  Neurological: She is alert.          Assessment & Plan:

## 2012-02-11 ENCOUNTER — Encounter: Payer: Self-pay | Admitting: Family Medicine

## 2012-02-11 ENCOUNTER — Ambulatory Visit (INDEPENDENT_AMBULATORY_CARE_PROVIDER_SITE_OTHER): Payer: Self-pay | Admitting: Family Medicine

## 2012-02-11 VITALS — BP 137/91 | HR 83 | Ht 63.0 in | Wt 196.5 lb

## 2012-02-11 DIAGNOSIS — E669 Obesity, unspecified: Secondary | ICD-10-CM

## 2012-02-11 DIAGNOSIS — I10 Essential (primary) hypertension: Secondary | ICD-10-CM

## 2012-02-11 NOTE — Assessment & Plan Note (Signed)
Will continue to monitor for now.  Will not add medication at this time.  Recommend exercise and weight loss.

## 2012-02-11 NOTE — Patient Instructions (Addendum)
Weight management: Your body needs 3 meals per day.  See plate method handout  Going to get membership for the [employer]- walking--in the evening- 2 x per week- for 1 hour.

## 2012-02-11 NOTE — Assessment & Plan Note (Addendum)
See patient instructions for exercise and diet goals/ recommendations.  Pt to see Dr. Gerilyn Pilgrim for more nutrition counseling.  Return in 1-2 month for weight follow up with me.   Greater than 50% of face time spent in counseling patient.

## 2012-02-11 NOTE — Progress Notes (Signed)
  Subjective:    Patient ID: Barbara Dunn, female    DOB: 04-Jan-1971, 41 y.o.   MRN: [id number]  HPI Blood pressure: BP elevated 150/88, 142/89, 138/86 Occasional dizziness, no problems with vision, has not taken any blood pressure medicine in over 1 year.   Weight management: Feeling tired, and being busy are some of the biggest barriers to exercise.  Going to get membership for the [employer]- walking--in the evening- 2 x per week- for 1 hour.  24 hour recall: Diet- Drinking more water  Breakfast- Skipped   Lunch- Jolly ranchers- 5 16 oz of juice Skipped  4pm - bag of chips  Dinner-9 pm [place] neck- 4 Potatoes broccoli and cheese Corn bread  No bedtime snack.   Review of Systems     Objective:   Physical Exam  Constitutional: She appears well-developed and well-nourished.  HENT:  Head: Normocephalic and atraumatic.  Neck: Normal range of motion.  Cardiovascular: Normal rate.   Pulmonary/Chest: Effort normal. No respiratory distress.  Musculoskeletal: She exhibits no edema.  Neurological: She is alert.  Skin: No rash noted.  Psychiatric: She has a normal mood and affect.          Assessment & Plan:

## 2012-03-14 ENCOUNTER — Telehealth: Payer: Self-pay | Admitting: *Deleted

## 2012-03-14 MED ORDER — TOPIRAMATE 50 MG PO TABS: 50.0000 mg | ORAL_TABLET | Freq: Two times a day (BID) | ORAL | Status: DC

## 2012-03-14 NOTE — Telephone Encounter (Signed)
Left message for patient to return call. Please tell her I sent Rx to her pharmacy for 1 month supply. She will need to schedule an office visit with Dr Edmonia James for further refills.[name], [name]

## 2012-03-19 NOTE — Telephone Encounter (Signed)
Note routed to [employer], CMA.  Gaylene Brooks, RN

## 2012-03-19 NOTE — Telephone Encounter (Signed)
Pt is asking for name brand - not generic

## 2012-03-20 NOTE — Telephone Encounter (Signed)
Forward to PCP.

## 2012-03-24 ENCOUNTER — Other Ambulatory Visit: Payer: Self-pay | Admitting: Family Medicine

## 2012-03-24 MED ORDER — CELECOXIB 100 MG PO CAPS: 100.0000 mg | ORAL_CAPSULE | Freq: Two times a day (BID) | ORAL | Status: DC | PRN

## 2012-03-24 MED ORDER — TOPIRAMATE 50 MG PO TABS: 50.0000 mg | ORAL_TABLET | Freq: Two times a day (BID) | ORAL | Status: DC

## 2012-03-24 NOTE — Telephone Encounter (Signed)
Send in another rx for topamax to be dispensed as written.

## 2012-03-25 ENCOUNTER — Telehealth: Payer: Self-pay | Admitting: Family Medicine

## 2012-03-25 NOTE — Telephone Encounter (Signed)
Pt inquiring about why she have to sched an appt for her refills.  Please call at earliest convenience

## 2012-03-27 ENCOUNTER — Encounter: Payer: Self-pay | Admitting: Family Medicine

## 2012-03-27 ENCOUNTER — Telehealth: Payer: Self-pay | Admitting: Family Medicine

## 2012-03-27 DIAGNOSIS — M79606 Pain in leg, unspecified: Secondary | ICD-10-CM | POA: Insufficient documentation

## 2012-03-27 NOTE — Telephone Encounter (Signed)
Patient is Celebrex refill request. Patient states she takes this for nonspecific leg pain. Takes this when necessary-but occasional have leg pain flares the last 3 or 4 weeks and she takes it twice a day schedule. During this time. Currently having a flare. Has problems walking whenever her legs hurt. Celebrex helps her walk. Patient would like to continue this medication. Discussed side effects of Celebrex including renal problems with patient. Patient agrees to come in for check of renal function. And agreed to come in for evaluation of leg pain to see if further imaging needed done. Patient to make an appointment during the next month.

## 2012-03-27 NOTE — Telephone Encounter (Signed)
See telephone note.

## 2012-04-10 ENCOUNTER — Encounter: Payer: Self-pay | Admitting: Family Medicine

## 2012-04-10 ENCOUNTER — Ambulatory Visit (INDEPENDENT_AMBULATORY_CARE_PROVIDER_SITE_OTHER): Payer: Self-pay | Admitting: Family Medicine

## 2012-04-10 VITALS — BP 127/84 | HR 73 | Ht 63.0 in | Wt 196.0 lb

## 2012-04-10 DIAGNOSIS — M79609 Pain in unspecified limb: Secondary | ICD-10-CM

## 2012-04-10 DIAGNOSIS — G43009 Migraine without aura, not intractable, without status migrainosus: Secondary | ICD-10-CM

## 2012-04-10 DIAGNOSIS — M79606 Pain in leg, unspecified: Secondary | ICD-10-CM

## 2012-04-10 DIAGNOSIS — E669 Obesity, unspecified: Secondary | ICD-10-CM

## 2012-04-10 MED ORDER — SUMATRIPTAN SUCCINATE 50 MG PO TABS: 50.0000 mg | ORAL_TABLET | Freq: Once | ORAL | Status: DC

## 2012-04-10 NOTE — Patient Instructions (Addendum)
Weight management: Call Dr. Gerilyn Pilgrim --call for an appointment.  Continue to work on setting exercise goals  Migraines: Continue topamax and imitrex as directed  Leg pain: Continue celebrex as needed for arthritis pain.  Do not take ibuprofen.

## 2012-04-10 NOTE — Progress Notes (Signed)
  Subjective:    Patient ID: Barbara Dunn, female    DOB: 11-Sep-1971, 41 y.o.   MRN: [id number]  HPI Leg pain bilateral: Patient reports that she has knee and back pain just above the knees off and on. Seems to be brought on by increased activity-especially when she doesn't get enough rest.  Patient takes Mobic as needed for pain. Patient states that when she rests and gets enough sleep this helps leg pain. Has been told the past and she has arthritis in her knees. No joint redness. No joint swelling. No joint pain today on exam.  Migraine: Patient requesting refill of Imitrex. She states that this helps with her headaches that she has 2 times per month. States she does not feel Topamax is helping her. She is taking it only one time per day. Supposed to be taking twice a day. States she doesn't like to take medications and this hard for her to take this medicine. No changes in the frequency of migraines.  Weight management: Patient has Higher education careers adviser. Has had a hard time getting to the gym due to schedule. Working on trying to get into a routine with exercise. Would like to meet with a nutritionist-the we discussed about doing at last appointment.  Smoking status reviewed.    Review of Systems    as per above. Objective:   Physical Exam  Constitutional: She appears well-developed and well-nourished.  HENT:  Head: Normocephalic and atraumatic.  Eyes: Right eye exhibits no discharge. Left eye exhibits no discharge.  Cardiovascular: Normal rate, regular rhythm and normal heart sounds.   No murmur heard. Pulmonary/Chest: Effort normal and breath sounds normal. No respiratory distress. She has no wheezes. She has no rales.  Musculoskeletal: She exhibits no edema.       Knee exam: + crepitus in knees bilateral with rom. Normal rom. Normal appearance, strength, and reflexes.  No skin changes.  No joint redness or swelling.  No pain on palpation.  Normal gait  Skin: No rash noted.   Psychiatric: She has a normal mood and affect.          Assessment & Plan:

## 2012-04-12 NOTE — Assessment & Plan Note (Signed)
Pt states that she is ready to meet with nutritionist- gave pt contact information for Dr. Gerilyn Pilgrim.  Pt also has gym membership and was encouraged to exercise daily.

## 2012-04-12 NOTE — Assessment & Plan Note (Signed)
+   crepitus of knee joint bilateral suggests arthritis.  No pain on today's exam.  Pt can continue to use celebrex prn since this gives her significant symptom control and improves function.   Discussed the importance of prn use only since it can cause renal problems if overused or used in conjunction with other nsaids.  Pt states understanding.

## 2012-04-12 NOTE — Assessment & Plan Note (Signed)
Pt states that imitrex works well if taken at first sign of migraine.  Pt doesn't feel that topamax is working well to decrease frequency of headaches- but also is only taking 1 x daily instead of bid.  Discussed options with patient of 1) discontining topamax if she feels she doesn't need a medication for migraine prevention or 2) taking the medication as prescribed to see if it decreases headache frequency.  Pt states understanding and states that she will think about this.

## 2012-04-21 ENCOUNTER — Other Ambulatory Visit: Payer: Self-pay | Admitting: Family Medicine

## 2012-04-21 MED ORDER — CELECOXIB 100 MG PO CAPS: 100.0000 mg | ORAL_CAPSULE | Freq: Two times a day (BID) | ORAL | Status: DC | PRN

## 2012-04-26 ENCOUNTER — Emergency Department (INDEPENDENT_AMBULATORY_CARE_PROVIDER_SITE_OTHER)
Admission: EM | Admit: 2012-04-26 | Discharge: 2012-04-26 | Disposition: A | Discharge status: Home / Self Care | Payer: Self-pay | Source: Home / Self Care | Attending: Emergency Medicine | Admitting: Emergency Medicine

## 2012-04-26 ENCOUNTER — Emergency Department (INDEPENDENT_AMBULATORY_CARE_PROVIDER_SITE_OTHER): Payer: Self-pay

## 2012-04-26 ENCOUNTER — Encounter (HOSPITAL_COMMUNITY): Payer: Self-pay | Admitting: *Deleted

## 2012-04-26 DIAGNOSIS — S60229A Contusion of unspecified hand, initial encounter: Secondary | ICD-10-CM
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
## 2012-06-05 ENCOUNTER — Other Ambulatory Visit: Payer: Self-pay | Admitting: Family Medicine

## 2012-06-05 ENCOUNTER — Other Ambulatory Visit: Payer: Self-pay | Admitting: *Deleted

## 2012-06-05 MED ORDER — NORGESTIMATE-ETH ESTRADIOL 0.25-35 MG-MCG PO TABS: 1.0000 | ORAL_TABLET | Freq: Every day | ORAL | Status: DC

## 2012-06-05 NOTE — Telephone Encounter (Signed)
Patient is calling because she said she contacted her pharmacy for a refill on her birth control and she is calling to check on the status of this.  There was no refill request in the MD's box.

## 2012-06-05 NOTE — Telephone Encounter (Signed)
Refill sent to pharmacy. Thank you for letting me know.

## 2012-07-10 ENCOUNTER — Encounter: Payer: Self-pay | Admitting: Family Medicine

## 2012-07-10 ENCOUNTER — Ambulatory Visit (INDEPENDENT_AMBULATORY_CARE_PROVIDER_SITE_OTHER): Payer: Self-pay | Admitting: Family Medicine

## 2012-07-10 VITALS — BP 142/84 | HR 73 | Temp 98.7°F | Ht 63.0 in | Wt 205.6 lb

## 2012-07-10 DIAGNOSIS — I10 Essential (primary) hypertension: Secondary | ICD-10-CM

## 2012-07-10 DIAGNOSIS — G43909 Migraine, unspecified, not intractable, without status migrainosus: Secondary | ICD-10-CM

## 2012-07-10 DIAGNOSIS — G43009 Migraine without aura, not intractable, without status migrainosus: Secondary | ICD-10-CM

## 2012-07-10 MED ORDER — SUMATRIPTAN SUCCINATE 50 MG PO TABS: 50.0000 mg | ORAL_TABLET | Freq: Once | ORAL | Status: DC

## 2012-07-10 MED ORDER — LISINOPRIL 10 MG PO TABS: 10.0000 mg | ORAL_TABLET | Freq: Every day | ORAL | Status: DC

## 2012-07-10 NOTE — Progress Notes (Signed)
  Subjective:    Patient ID: Barbara Dunn, female    DOB: October 29, 1970, 41 y.o.   MRN: [id number]  HPI  41 year old F with migraine headaches, chronic right leg pain, and hypertension who presents for a routine follow up.   Headaches - She has problems with "migraines" headaches in the past. She was prescribed Topamax for a daily controller medication but has been non-compliant since her previous MD started the medication. She takes it on occasion when she feels like she is having frequent headaches.  Location: frontal bilateral  Quality: variable, occasionally debilitating but it has been several months  Duration of headache without treatment: up to 1 week Current Frequency: every week Longest duration without headache: 1.5 weeks Accompanying symptoms: yes nausea, no vomiting, yes photophobia, yes phonophobia, no lacrimation, no rhinorrhea, occassionally worsened with activity, questionable neurologic symptoms Effective treatment: imitrex Ineffective treatment: the patient claims that max is ineffective, she does not take it as prescribed to her History of headaches: years duration History of imaging: CT scan negative "years ago" Known triggers: none History of headache journal: no  2. HTN - she was prescribed along the past, but stopped taking it on her own accord; Barbara Dunn denies chest pain, shortness of breath, peripheral edema, blurry vision, any unilateral weakness  BP Readings from Last 3 Encounters:  07/10/12 142/84  04/26/12 136/90  04/10/12 127/84     Review of Systems Constipation, oral numbness     Objective:   Physical Exam BP 142/84  Pulse 73  Temp 98.7 F (37.1 C) (Oral)  Ht 5\' 3"  (1.6 m)  Wt 205 lb 9.6 oz (93.26 kg)  BMI 36.42 kg/m2 Gen.: well-appearing African American female, obese body habitus, nondistressed, pleasant and conversant HEENT: numerous nevi in a malar distribution on her face, he was able round reactive to light Cardiovascular: regular rate and  rhythm no murmurs Pulmonary: lungs clear to auscultation Neuro: cranial nerves II through XII grossly intact Extremities: no peripheral edema     Assessment & Plan:  41 year old female with uncontrolled hypertension and migraine headaches who is not compliant with her medications.

## 2012-07-10 NOTE — Patient Instructions (Addendum)
Dear Mrs. Algeo,   Thank you for coming to clinic today. Please read below regarding the issues that we discussed.   1. Headaches - Use the imitrex as needed.   2. Hypertension - The best way to decrease it on your own is to exercise. We should also use a once a day pill called lisinopril.   Please follow up in clinic in 1 month. Please call earlier if you have any questions or concerns.   Sincerely,   Dr. Clinton Sawyer

## 2012-07-13 DIAGNOSIS — I10 Essential (primary) hypertension: Secondary | ICD-10-CM | POA: Insufficient documentation

## 2012-07-13 NOTE — Assessment & Plan Note (Signed)
BP Readings from Last 3 Encounters:  07/10/12 142/84  04/26/12 136/90  04/10/12 127/84   Patient needs HTN treatment. She is currently agreeable to this and understands the risks of long tern elevated blood pressure. Start lisinopril 10 mg daily and followup in one month.

## 2012-07-13 NOTE — Assessment & Plan Note (Signed)
Description of the headaches are consistent with migraines. Unfortunately patient is noncompliant with taking daily Topamax, include his not desire to take a daily medication. She does get relief from taking Imitrex as needed, and is not taking it too frequently. Therefore I will prescribe Imitrex to use when necessary for migraine headaches.

## 2012-08-12 ENCOUNTER — Ambulatory Visit (INDEPENDENT_AMBULATORY_CARE_PROVIDER_SITE_OTHER): Payer: Self-pay | Admitting: Family Medicine

## 2012-08-12 ENCOUNTER — Encounter: Payer: Self-pay | Admitting: Family Medicine

## 2012-08-12 VITALS — BP 134/88 | HR 80 | Ht 62.0 in | Wt 208.0 lb

## 2012-08-12 DIAGNOSIS — I10 Essential (primary) hypertension: Secondary | ICD-10-CM

## 2012-08-12 DIAGNOSIS — G43009 Migraine without aura, not intractable, without status migrainosus: Secondary | ICD-10-CM

## 2012-08-12 DIAGNOSIS — E669 Obesity, unspecified: Secondary | ICD-10-CM

## 2012-08-12 LAB — BASIC METABOLIC PANEL
Chloride: 106 mEq/L (ref 96–112)
Creat: 0.92 mg/dL (ref 0.50–1.10)
Potassium: 3.8 mEq/L (ref 3.5–5.3)

## 2012-08-12 NOTE — Progress Notes (Signed)
  Subjective:    Patient ID: Barbara Dunn, female    DOB: 01-17-1971, 41 y.o.   MRN: [id number]  HPI  1. Headache - only 1 or 2 light headaches, has not required medications, no loss of work, or interrupting her life, no Imitrex needed;   2. Blood Pressure - Has been taking lisinopril since 07/10/12, feels like headaches are decreasing in frequency, no other side effects, denies chest pain, shortness of breath  Review of Systems Positive - Right sided flank pain that is intermittent, not associated with nausea, vomiting, polyuria, fever, or dysuria; over consumption of sugar (25 packets in 1 cup of coffee, with 1 cup per week) Negative - see HPI    Objective:   Physical Exam BP 134/88  Pulse 80  Ht 5\' 2"  (1.575 m)  Wt 208 lb (94.348 kg)  BMI 38.04 kg/m2 Gen: well appearing AAF, obese body habitus, pleasant and conversant HEENT: NCAT, PERRLA, OP clear and moist, no sinus tenderness CV: RRR, no murmurs Flank: no deformity, no tenderness     Assessment & Plan:  41 year old F who present for f/u of HTN and migraine headaches that we both well controlled.

## 2012-08-14 ENCOUNTER — Encounter: Payer: Self-pay | Admitting: Family Medicine

## 2012-08-14 NOTE — Assessment & Plan Note (Signed)
Patient counseled on healthy eating habits and regular exercise. She will work to reduce sugar consumption.

## 2012-08-14 NOTE — Assessment & Plan Note (Signed)
Continue Lisinopril 10 mg. Check BMET.

## 2012-08-14 NOTE — Assessment & Plan Note (Signed)
Well controlled. No imitrex needed. Continue to follow.

## 2012-09-02 ENCOUNTER — Encounter: Payer: Self-pay | Admitting: Family Medicine

## 2012-09-02 ENCOUNTER — Ambulatory Visit (INDEPENDENT_AMBULATORY_CARE_PROVIDER_SITE_OTHER): Payer: Self-pay | Admitting: Family Medicine

## 2012-09-02 VITALS — BP 144/88 | HR 78 | Ht 62.0 in | Wt 206.5 lb

## 2012-09-02 DIAGNOSIS — R82998 Other abnormal findings in urine: Secondary | ICD-10-CM

## 2012-09-02 DIAGNOSIS — R300 Dysuria: Secondary | ICD-10-CM

## 2012-09-02 DIAGNOSIS — R8290 Unspecified abnormal findings in urine: Secondary | ICD-10-CM

## 2012-09-02 DIAGNOSIS — E669 Obesity, unspecified: Secondary | ICD-10-CM

## 2012-09-02 LAB — POCT URINALYSIS DIPSTICK
Glucose, UA: NEGATIVE
Ketones, UA: NEGATIVE
Spec Grav, UA: 1.02
Urobilinogen, UA: 1

## 2012-09-02 NOTE — Progress Notes (Signed)
  Subjective:    Patient ID: Barbara Dunn, female    DOB: 1971-06-02, 41 y.o.   MRN: [id number]  HPI  41 year old F who presents with malodorous urine.   Malodorous Urine - Started 1 week ago. It is intermittent. It is not associated or abnormal discharge. Intermittent abdominal pain. No nausea or vomiting. No fever. No history change in urine smell. Has not changed her diet or beverages in the past week.   Nutrition - Patient is concerned about weight. She would like to lose weight. She is concerned about overeating. She is not currently controlling her diet. She is planning to go to the gym next week.   Review of Systems      Objective:   Physical Exam  BP 144/88  Pulse 78  Ht 5\' 2"  (1.575 m)  Wt 206 lb 8 oz (93.668 kg)  BMI 37.77 kg/m2  LMP 08/26/2012 Gen: AAF, pleasant and conversant, non ill appearing, obese body habitus Abd: soft, NDNT, NABS, no suprapubic distension or tendnerss  Urinalysis    Component Value Date/Time   COLORURINE YELLOW 02/08/2010 2139   APPEARANCEUR CLEAR 02/08/2010 2139   LABSPEC 1.019 02/08/2010 2139   PHURINE 6.5 02/08/2010 2139   GLUCOSEU NEGATIVE 02/08/2010 2139   HGBUR NEGATIVE 02/08/2010 2139   BILIRUBINUR NEG 09/02/2012 1507   BILIRUBINUR NEGATIVE 02/08/2010 2139   KETONESUR 15* 02/08/2010 2139   PROTEINUR NEGATIVE 02/08/2010 2139   UROBILINOGEN 1.0 09/02/2012 1507   UROBILINOGEN 0.2 02/08/2010 2139   NITRITE NEG 09/02/2012 1507   NITRITE NEGATIVE 02/08/2010 2139   LEUKOCYTESUR Negative 09/02/2012 1507      Assessment & Plan:  41 year old obese female with malodorous urine of unknown etiology.

## 2012-09-02 NOTE — Patient Instructions (Addendum)
BP Readings from Last 3 Encounters:  09/02/12 144/88  08/12/12 134/88  07/10/12 142/84    I will make a referral  Dr. Gerilyn Pilgrim, and you can call her regarding nutrition. I would encourage you to make a food journal for 3 days before meeting with her.    I have no concerns about your urine. I would like you to come back in 3-4 weeks to check your blood pressure again.   Take Care,   Dr. Clinton Sawyer

## 2012-09-07 DIAGNOSIS — R8290 Unspecified abnormal findings in urine: Secondary | ICD-10-CM | POA: Insufficient documentation

## 2012-09-07 NOTE — Assessment & Plan Note (Signed)
Patient is concerned about poor eating habits and knows several problems with her diet. She has difficulty enacting changes and believes that she would benefit from the education and accountability of meeting with our nutritionist,Dr. Gerilyn Pilgrim. I agree, because we have talked about her diet extensively, but she has been reluctant to make changes. I provided Dr. Johnell Comings contact information, and the patient will call immediately.

## 2012-09-07 NOTE — Assessment & Plan Note (Signed)
Unknown etiology - clearly not infectious based upon urinalysis; likely related to ingestion of new food product or secondary to dehydration as the patient drinks mainly soft drinks; no red flags for other illness at this point; expectant management appropriate

## 2012-09-16 ENCOUNTER — Encounter: Payer: Self-pay | Admitting: Family Medicine

## 2012-09-16 ENCOUNTER — Ambulatory Visit (INDEPENDENT_AMBULATORY_CARE_PROVIDER_SITE_OTHER): Payer: Self-pay | Admitting: Family Medicine

## 2012-09-16 VITALS — Ht 62.0 in | Wt 208.7 lb

## 2012-09-16 DIAGNOSIS — I10 Essential (primary) hypertension: Secondary | ICD-10-CM

## 2012-09-16 DIAGNOSIS — E669 Obesity, unspecified: Secondary | ICD-10-CM

## 2012-09-16 NOTE — Patient Instructions (Addendum)
-   Get on a consistent schedule of eating:  Eat at least 3 meals and 1-2 snacks per day.  Aim for no more than 5 hours between eating.  - Breakfast is important to help you try to lose weight:  A source of PROTEIN is especially important at breakfast.  Also include some kind of starchy food like bread, cereal, fruit.     - Starchy (carb) foods include: Bread, rice, pasta, potatoes, corn, crackers, bagels, muffins, all baked goods.   - Protein foods include: Meat, fish, poultry, eggs, dairy foods, and beans such as pinto and kidney beans (beans also provide carbohydrate).  - Obtain twice as many veg's as protein or carbohydrate foods for both lunch and dinner.  Including protein at [facility] meal will also help you control your appetite.    - Once your work schedule eases up a bit, start your new exercise routine:    GOAL:  Some kind of exercise at least 30 minutes 3 X wk.     Home exercise:  Find YouTube exercise videos to follow.     Keep a record of each time you exercise (in your phone).  Bring to follow-up in January.    As often as you can, get up and move throughout the day.    - Sleep is also very important for you to be healthy, and it's important to help you manage your weight.  Do your best to get more sleep.

## 2012-09-16 NOTE — Progress Notes (Signed)
Medical Nutrition Therapy:  Appt start time: 1500 end time:  1600.  Assessment:  Primary concerns today: Weight management and blood pressure management.   Usual eating pattern includes 2 meals and 3-6 snacks per day; erratic schedule of eating.   Usual physical activity includes none.  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   She is a single parent (24, 59, & 32) who also helps care for her mom.  Starting Dec 2 her hours will be 10 hrs on MWF, and 7 hrs on T & Th.  Barbara Dunn eats restaurant foods once a week, and frozen meals at least 3-4 per week.  She estimates she eats a serving of veg's at least once a day.  Of concern is her sleep schedule, which is currently only 3-5 hours per night.  As I told he, I am more concerned that she get sleep than exercise at this time.    Everyday foods include Kool-Aid/diet soda/diet tea, 4 c water.  Avoided foods include none.   24-hr recall: B (9 AM)-   3 pancakes w/ syrup & butter, water Snk (11 AM)-   5 peppermints Snk- (12 PM)-  1 banana, chips, 10 oz juice Snk (1 PM)-  3 peppermints Snk (2 PM)-  2 slices cheese, 10 oz juice D (4 PM)-  1 c each broccoli, collards, candied yams Snk (5 PM)-  Fritos Snk (8 PM)-  1 fried chx drummette Snk (11 PM)-  3 oz steak, 1/2 biscuit  Progress Towards Goal(s):  In progress.   Nutritional Diagnosis:  NB-2.1 Physical inactivity As related to time and energy constraints.  As evidenced by no physical activity reported.     Intervention:  Nutrition education.  Monitoring/Evaluation:  Dietary intake, exercise, and body weight in 6 week(s).

## 2012-09-25 ENCOUNTER — Ambulatory Visit: Payer: No Typology Code available for payment source | Admitting: *Deleted

## 2012-09-25 VITALS — BP 120/84 | HR 88

## 2012-09-25 DIAGNOSIS — I10 Essential (primary) hypertension: Secondary | ICD-10-CM

## 2012-09-25 NOTE — Progress Notes (Signed)
In for BP check.  BP checked with dynamap in [place]  BP 120/84   Pulse 88.    Patient reports marble size area in right lower abdomen just above groin area.  Noticed 3 weeks ago. No redness noted, but is sore . No fevers. Having some discharge  and she states this is usual for her but may be heavier than usual. No abdominal pain .  Offered patient appointment today , tomorrow or anytime she can arrange but she can only come on 12/17. Appointment scheduled but advised to call back sooner if worsens. Consulted with Dr. Gwendolyn Grant and strongly encouraged patient to come in sooner for evaluation.

## 2012-10-07 ENCOUNTER — Encounter: Payer: Self-pay | Admitting: Family Medicine

## 2012-10-07 ENCOUNTER — Ambulatory Visit (INDEPENDENT_AMBULATORY_CARE_PROVIDER_SITE_OTHER): Payer: No Typology Code available for payment source | Admitting: Family Medicine

## 2012-10-07 VITALS — BP 128/85 | HR 72 | Ht 62.0 in | Wt 208.0 lb

## 2012-10-07 DIAGNOSIS — R1909 Other intra-abdominal and pelvic swelling, mass and lump: Secondary | ICD-10-CM

## 2012-10-07 DIAGNOSIS — R1900 Intra-abdominal and pelvic swelling, mass and lump, unspecified site: Secondary | ICD-10-CM

## 2012-10-07 NOTE — Progress Notes (Signed)
Patient ID: Barbara Dunn    DOB: 08/26/1971, 41 y.o.   MRN: [id number] --- Subjective:  Barbara Dunn is a 41 y.o.female who presents with concern about "knot" on right groin x1 month. Knot was larger than it is now, around quarter size, was swollen and tender at the time. She used neosporin and alcohol. It has since reduced in size. No fevers. No weight loss. No viral illness during this time.    ROS: see HPI Past Medical History: reviewed and updated medications and allergies. Social History: Tobacco:former smoker  Objective: Filed Vitals:   10/07/12 1515  BP: 128/85  Pulse: 72    Physical Examination:   General appearance - alert, well appearing, and in no distress Groin - no mass or lymph node appreciated. No tenderness

## 2012-10-07 NOTE — Patient Instructions (Addendum)
I think you had an enlarged lymph node, but the fact that we can't feel it much anymore and that it is smaller than what is was, is a good thing.  If it were to become larger and stay there, come back.

## 2012-10-08 DIAGNOSIS — R1909 Other intra-abdominal and pelvic swelling, mass and lump: Secondary | ICD-10-CM | POA: Insufficient documentation

## 2012-10-08 NOTE — Assessment & Plan Note (Signed)
Not detectable on exam. Given history, this was likely a reactive lymph node. Reviewed red flags for return: enlarged mass not getting smaller, weight loss.Marland KitchenMarland Kitchen

## 2012-10-28 ENCOUNTER — Ambulatory Visit: Payer: Self-pay | Admitting: Family Medicine

## 2012-11-13 ENCOUNTER — Ambulatory Visit: Payer: Self-pay | Admitting: Family Medicine

## 2012-12-24 ENCOUNTER — Other Ambulatory Visit: Payer: Self-pay | Admitting: Family Medicine

## 2012-12-24 MED ORDER — CELECOXIB 100 MG PO CAPS: 100.0000 mg | ORAL_CAPSULE | Freq: Two times a day (BID) | ORAL | Status: DC | PRN

## 2013-05-07 ENCOUNTER — Other Ambulatory Visit: Payer: Self-pay | Admitting: *Deleted

## 2013-05-07 MED ORDER — NORGESTIMATE-ETH ESTRADIOL 0.25-35 MG-MCG PO TABS: 1.0000 | ORAL_TABLET | Freq: Every day | ORAL | Status: DC

## 2013-05-12 ENCOUNTER — Ambulatory Visit (INDEPENDENT_AMBULATORY_CARE_PROVIDER_SITE_OTHER): Payer: BC Managed Care – PPO | Admitting: Family Medicine

## 2013-05-12 ENCOUNTER — Encounter: Payer: Self-pay | Admitting: Family Medicine

## 2013-05-12 VITALS — BP 121/81 | HR 67 | Temp 98.5°F | Ht 62.0 in | Wt 202.0 lb

## 2013-05-12 DIAGNOSIS — L0390 Cellulitis, unspecified: Secondary | ICD-10-CM

## 2013-05-12 DIAGNOSIS — R21 Rash and other nonspecific skin eruption: Secondary | ICD-10-CM

## 2013-05-12 DIAGNOSIS — G43009 Migraine without aura, not intractable, without status migrainosus: Secondary | ICD-10-CM

## 2013-05-12 DIAGNOSIS — G43909 Migraine, unspecified, not intractable, without status migrainosus: Secondary | ICD-10-CM

## 2013-05-12 MED ORDER — CEPHALEXIN 500 MG PO CAPS: 500.0000 mg | ORAL_CAPSULE | Freq: Two times a day (BID) | ORAL | Status: DC

## 2013-05-12 MED ORDER — SUMATRIPTAN SUCCINATE 50 MG PO TABS: 50.0000 mg | ORAL_TABLET | Freq: Once | ORAL | Status: DC

## 2013-05-12 NOTE — Assessment & Plan Note (Signed)
Refill Imitrex to use PRN.

## 2013-05-12 NOTE — Progress Notes (Signed)
  Subjective:    Patient ID: Barbara Dunn, female    DOB: 10-03-1971, 42 y.o.   MRN: [id number]  HPI  42 year old F who presents with concern of a spider bite of her left arm. She has a tender, swollen area on her LUE. She did not see any insect actually bite her, but presumed that is what happened  Location: left upper arm, distal to shoulder Onset: 2 days ago Course: improving redness and swelling, as it used to be large "welt" Associated Symptoms: itching Alleviating: Hot water Exacberation: Nothing Hx of similar rash/lesion/reaction: no  Headache; Patient requesting refill of headache medication Imitrex, has been out of medication for several months, has frequent headaches but only needed abortive therapy 1-2x per month, currently without headache   Review of Systems Denies fever, chills, fatigue, myalgias,     Objective:   Physical Exam BP 121/81  Pulse 67  Temp(Src) 98.5 F (36.9 C)  Ht 5\' 2"  (1.575 m)  Wt 202 lb (91.627 kg)  BMI 36.94 kg/m2 Gen: obese AAM, non distressed Skin: lateral left upper extremity along mid humerus with warm erythematous firm patch with two very small breaks in the skin, non tender and no drainage or fluctuance Neuro: CN II-XII grossly intact        Assessment & Plan:

## 2013-05-12 NOTE — Patient Instructions (Signed)
Dear Mrs. Stohr,   Thank you for coming to clinic today. Please read below regarding the issues that we discussed.   1. Headaches - Continue to use the imitrex as needed for headaches.   2. Bug bite - The inflammation looks like it is better. It is does not continue to improve or gets worse, please start the antibiotic called Keflex and use for 7 days. Please follow up if it gets worse after starting the antibiotics.   Please follow up in clinic around the time of your birthday for a full check-up. Please call earlier if you have any questions or concerns.   Sincerely,   Dr. Clinton Sawyer

## 2013-05-12 NOTE — Assessment & Plan Note (Signed)
Rash of LUE concerning for local inflammatory reaction to insect bite versus contact dermatitis. Given the warmth and redness, I am also concerned about cellulitis. However, my suspicion for cellulitis decreased when she showed me a previous photo of large wheal from Sunday. Continue supportive treatment with NSAID and warm warm. If getting worse or associated with fever, then start Keflex. Given Rx for Keflex 500 mg PO q 12h x 7 days.

## 2013-05-26 ENCOUNTER — Telehealth: Payer: Self-pay | Admitting: Family Medicine

## 2013-05-26 NOTE — Telephone Encounter (Signed)
Will fwd. To Dr.Williamson for review. .Barbara Dunn  

## 2013-05-26 NOTE — Telephone Encounter (Signed)
Pt is asking that Dr. Clinton Sawyer call in some more anti-biotics cephalexim to her pharmacy because she still has the spider bite and needs some more. The pharmacy will not refill this for her. JW

## 2013-05-26 NOTE — Telephone Encounter (Signed)
Patient needs to be seen in clinic to evaluate her skin lesion since it did not improve with the initial antibiotics. This may not be a bacterial infection at all and is almost definitely note a spider bite.

## 2013-05-29 ENCOUNTER — Encounter: Payer: Self-pay | Admitting: Family Medicine

## 2013-05-29 ENCOUNTER — Ambulatory Visit (INDEPENDENT_AMBULATORY_CARE_PROVIDER_SITE_OTHER): Payer: BC Managed Care – PPO | Admitting: Family Medicine

## 2013-05-29 VITALS — BP 139/87 | HR 80 | Temp 98.5°F | Ht 62.0 in | Wt 201.0 lb

## 2013-05-29 DIAGNOSIS — L500 Allergic urticaria: Secondary | ICD-10-CM

## 2013-05-29 MED ORDER — HYDROXYZINE HCL 10 MG PO TABS: 10.0000 mg | ORAL_TABLET | Freq: Three times a day (TID) | ORAL | Status: DC | PRN

## 2013-05-29 NOTE — Patient Instructions (Addendum)
It was a pleasure seeing you today!  As we discussed, we are unsure as to why you are having this recurrent itching and swelling appearing in different parts of your body, so we are referring you to an allergist to test for specific triggers.   Continue to use warm compress, and ibuprofen to help with the inflammation. For the itchiness we are prescribing you hydroxyzine 10 mg to be taken three times daily as needed for itchiness. This medication may make you sleepy and you should not take this medicine with alcohol as it will worsen these effects.   Please follow up with Dr. Clinton Sawyer, your PCP, as needed especially if you do not experience improvement over the next two weeks. Make an immediate appointment or go to the ED if you begin to experience difficulty breathing or swallowing, fevers, chills, sweats, or severe light-headedness.

## 2013-05-29 NOTE — Progress Notes (Signed)
  Subjective:    Patient ID: Barbara Dunn, female    DOB: 1970/11/03, 42 y.o.   MRN: [id number]  HPI Patient seen today as SDA for sudden onset of swelling and redness along the right side of her neck, beginning when she woke up this morning.  She applied a heat pack and notes it has subsided somewhat since onset.  Intensely pruritic; no difficulty breathing, speaking or swallowing.  No fevers or chills.  She is unaware of any new exposures, of contact with insects/arthropods, she does not garden and has not been around toxic plants.  No recent changes in hygiene products or detergents.  This is very similar in nature to recent skin outbreaks along left arm, seen in this office by Dr Clinton Sawyer on 05/12/2013 for what the patient had thought was a spider bite despite no visible contact with spiders.   Review of Systems  Patient denies history of eczema or asthma; she has not had similar reactions in the past prior to her most recent visit July 22nd, 2014.     Objective:   Physical Exam Well appearing, no apparent distress.  Breathing easily and with fluid speech. HEENT Neck supple and without cervical adenopathy. Large erythematous wheal in linear distribution along neckline on the right side of neck, no break in the skin noted.  Not fluctuant.  Full active ROM of neck in all planes.  SKIN: Left arm with healed hyperpigmented macule along upper arm, dry and non-fluctuant.         Assessment & Plan:

## 2013-05-29 NOTE — Assessment & Plan Note (Signed)
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 The one she presents with today is remarkably large.  It is not interfering with her respiration or speech/swallowing.  She does not have any new jewelry or hygiene products, no family members with similar urticarial presentations.  Given the recurrence and the concern for worsening in severity with subsequent episodes, I am referring her for Allergy/Immunology evaluation.  She is instructed to treat the acute urticaria with topical hydrocortisone and hydroxyzine antihistamine.  She is in agreement with this plan.

## 2013-08-12 ENCOUNTER — Encounter (HOSPITAL_COMMUNITY): Payer: Self-pay | Admitting: Emergency Medicine

## 2013-08-12 ENCOUNTER — Emergency Department (HOSPITAL_COMMUNITY)
Admission: EM | Admit: 2013-08-12 | Discharge: 2013-08-12 | Disposition: A | Discharge status: Home / Self Care | Payer: BC Managed Care – PPO | Attending: Emergency Medicine | Admitting: Emergency Medicine

## 2013-08-12 DIAGNOSIS — Z79899 Other long term (current) drug therapy: Secondary | ICD-10-CM | POA: Insufficient documentation

## 2013-08-12 DIAGNOSIS — I10 Essential (primary) hypertension: Secondary | ICD-10-CM | POA: Insufficient documentation

## 2013-08-12 DIAGNOSIS — S4980XA Other specified injuries of shoulder and upper arm, unspecified arm, initial encounter: Secondary | ICD-10-CM | POA: Insufficient documentation

## 2013-08-12 DIAGNOSIS — E663 Overweight: Secondary | ICD-10-CM | POA: Insufficient documentation

## 2013-08-12 DIAGNOSIS — IMO0002 Reserved for concepts with insufficient information to code with codable children: Secondary | ICD-10-CM | POA: Insufficient documentation

## 2013-08-12 DIAGNOSIS — T148XXA Other injury of unspecified body region, initial encounter: Secondary | ICD-10-CM

## 2013-08-12 DIAGNOSIS — Z792 Long term (current) use of antibiotics: Secondary | ICD-10-CM | POA: Insufficient documentation

## 2013-08-12 DIAGNOSIS — Y92410 Unspecified street and highway as the place of occurrence of the external cause: Secondary | ICD-10-CM | POA: Insufficient documentation

## 2013-08-12 DIAGNOSIS — Z862 Personal history of diseases of the blood and blood-forming organs and certain disorders involving the immune mechanism: Secondary | ICD-10-CM | POA: Insufficient documentation

## 2013-08-12 DIAGNOSIS — M129 Arthropathy, unspecified: Secondary | ICD-10-CM | POA: Insufficient documentation

## 2013-08-12 DIAGNOSIS — G43909 Migraine, unspecified, not intractable, without status migrainosus: Secondary | ICD-10-CM | POA: Insufficient documentation

## 2013-08-12 DIAGNOSIS — S46909A Unspecified injury of unspecified muscle, fascia and tendon at shoulder and upper arm level, unspecified arm, initial encounter: Secondary | ICD-10-CM | POA: Insufficient documentation

## 2013-08-12 DIAGNOSIS — Y9389 Activity, other specified: Secondary | ICD-10-CM | POA: Insufficient documentation

## 2013-08-12 DIAGNOSIS — Z87891 Personal history of nicotine dependence: Secondary | ICD-10-CM | POA: Insufficient documentation

## 2013-08-12 HISTORY — DX: Unspecified osteoarthritis, unspecified site: M19.90

## 2013-08-12 MED ORDER — IBUPROFEN 800 MG PO TABS: 800.0000 mg | ORAL_TABLET | Freq: Three times a day (TID) | ORAL | Status: DC

## 2013-08-12 MED ORDER — IBUPROFEN 800 MG PO TABS
800.0000 mg | ORAL_TABLET | Freq: Once | ORAL | Status: AC
  Administered 2013-08-12: 800 mg via ORAL
  Filled 2013-08-12: qty 1
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
## 2013-08-13 ENCOUNTER — Telehealth (HOSPITAL_COMMUNITY): Payer: Self-pay

## 2013-08-13 NOTE — ED Notes (Signed)
Pt called needing extendition on work note. I spoke with Fayrene Helper and he approved off till Monday due to weekend job pt having to lift [employer].

## 2013-08-18 ENCOUNTER — Encounter: Payer: Self-pay | Admitting: Family Medicine

## 2013-08-18 ENCOUNTER — Ambulatory Visit (INDEPENDENT_AMBULATORY_CARE_PROVIDER_SITE_OTHER): Payer: BC Managed Care – PPO | Admitting: Family Medicine

## 2013-08-18 VITALS — BP 150/100 | HR 104 | Ht 62.0 in | Wt 212.6 lb

## 2013-08-18 DIAGNOSIS — M25519 Pain in unspecified shoulder: Secondary | ICD-10-CM

## 2013-08-18 DIAGNOSIS — M25511 Pain in right shoulder: Secondary | ICD-10-CM | POA: Insufficient documentation

## 2013-08-18 DIAGNOSIS — Z23 Encounter for immunization: Secondary | ICD-10-CM

## 2013-08-18 DIAGNOSIS — I10 Essential (primary) hypertension: Secondary | ICD-10-CM

## 2013-08-18 DIAGNOSIS — G43909 Migraine, unspecified, not intractable, without status migrainosus: Secondary | ICD-10-CM

## 2013-08-18 DIAGNOSIS — Z309 Encounter for contraceptive management, unspecified: Secondary | ICD-10-CM

## 2013-08-18 MED ORDER — SUMATRIPTAN SUCCINATE 50 MG PO TABS: 50.0000 mg | ORAL_TABLET | Freq: Once | ORAL | Status: DC

## 2013-08-18 MED ORDER — NORGESTIMATE-ETH ESTRADIOL 0.25-35 MG-MCG PO TABS: 1.0000 | ORAL_TABLET | Freq: Every day | ORAL | Status: DC

## 2013-08-18 MED ORDER — LISINOPRIL 20 MG PO TABS: 20.0000 mg | ORAL_TABLET | Freq: Every day | ORAL | Status: DC

## 2013-08-18 NOTE — Assessment & Plan Note (Signed)
Soreness due to contusion Cont ibuprofen PRN

## 2013-08-18 NOTE — Assessment & Plan Note (Signed)
Elevated DBP possible worsened by contraception Increase lisinopril to 20 mg
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
## 2013-09-15 ENCOUNTER — Encounter: Payer: Self-pay | Admitting: Family Medicine

## 2013-09-15 ENCOUNTER — Ambulatory Visit (INDEPENDENT_AMBULATORY_CARE_PROVIDER_SITE_OTHER): Payer: BC Managed Care – PPO | Admitting: Family Medicine

## 2013-09-15 VITALS — BP 128/82 | HR 67 | Ht 62.0 in | Wt 210.0 lb

## 2013-09-15 DIAGNOSIS — L659 Nonscarring hair loss, unspecified: Secondary | ICD-10-CM

## 2013-09-15 DIAGNOSIS — I10 Essential (primary) hypertension: Secondary | ICD-10-CM

## 2013-09-15 NOTE — Assessment & Plan Note (Signed)
Assessment: well-controlled hypertension on 20 mg of lisinopril Plan: continue current treatment

## 2013-09-15 NOTE — Assessment & Plan Note (Signed)
Assessment: thinning of frontal hair without clear evidence of etiology; no new medication reaction or traction alopecia  Plan: patient to continued taking biotin supplements for hair; will f/u in 2 months

## 2013-09-15 NOTE — Progress Notes (Signed)
  Subjective:    Patient ID: Barbara Dunn, female    DOB: 15-Dec-1970, 42 y.o.   MRN: [id number]  HPI  Hypertension  Home BP monitoring: yes in the 140's/90's usually,   Office BP: BP Readings from Last 3 Encounters:  09/15/13 140/90  08/18/13 150/100  08/12/13 137/94    Prescribed meds: lisinopril 20 mg (increased from 10 mg to 20 mg)   Hypertension ROS:  Taking medications as prescribed: Yes Chest pain: No Shortness of breath: No Swelling of extremities: No TIA symptoms: No Regular Exercise: no Low Na+ Diet: no   Hair Loss: Location: frontal Duration: 2-3 months No recent braids, New medications started, no chemicals or dyes used on an Concerned that it is due to stress and lack of sleep Patient denies depression    Review of Systems See hpi     Objective:   Physical Exam BP 140/90  Pulse 67  Ht 5\' 2"  (1.575 m)  Wt 210 lb (95.255 kg)  BMI 38.40 kg/m2  LMP 08/25/2013  Gen.: obese middle-aged black female, well appearing, pleasant and conversant Hair: generalized thinning of the frontal area bilaterally without well demarcated areas of baldness; hair thick over the vertex Cardiovascular: regular rate and rhythm, no murmurs Lungs: clear to auscultation  GAD7: 8     Assessment & Plan:

## 2013-09-15 NOTE — Patient Instructions (Addendum)
Dear Barbara Dunn,   It was nice to see you today. Please read below:  1. Blood Pressure - It is much better on the higher dose of medication.   2. Hair loss - I do not see a cause that can easily be changed. Stress does not help. Please continue the vitamins and do not pick your hair too much.   3. Stress - I realize that you have a lot going on, and you're doing a great job managing.   4. Constipation - Please read below.   Sincerely,   Dr. Mellody Life [facility]. Irregular bowel habits such as constipation can lead to many problems over time.  Having one soft bowel movement a day is the most important way to prevent further problems.  The anorectal canal is designed to handle stretching and feces to safely manage our ability to get rid of solid waste (feces, poop, stool) out of our body.  BUT, hard constipated stools can act like ripping concrete bricks causing inflamed hemorrhoids, anal fissures, abdominal pain and bloating.     The goal: ONE SOFT BOWEL MOVEMENT A DAY!  To have soft, regular bowel movements:    Drink at least 8 tall glasses of water a day.     Take plenty of fiber.  Fiber is the undigested part of plant food that passes into the colon, acting s "natures broom" to encourage bowel motility and movement.  Fiber can absorb and hold large amounts of water. This results in a larger, bulkier stool, which is soft and easier to pass. Work gradually over several weeks up to 6 servings a day of fiber (25g a day even more if needed) in the form of: o Vegetables -- Root (potatoes, carrots, turnips), leafy green (lettuce, salad greens, celery, spinach), or cooked high residue (cabbage, broccoli, etc) o Fruit -- Fresh (unpeeled skin & pulp), Dried (prunes, apricots, cherries, etc ),  or stewed ( applesauce)  o Whole grain breads, pasta, etc (whole wheat)  o Bran cereals    Bulking Agents -- This type of water-retaining fiber generally is easily obtained each day by one of  the following:  o Psyllium bran -- The psyllium plant is remarkable because its ground seeds can retain so much water. This product is available as Metamucil, Konsyl, Effersyllium, Per Diem Fiber, or the less expensive generic preparation in drug and health food stores. Although labeled a laxative, it really is not a laxative.  o Methylcellulose -- This is another fiber derived from wood which also retains water. It is available as Citrucel. o Polyethylene Glycol - and "artificial" fiber commonly called Miralax or Glycolax.  It is helpful for people with gassy or bloated feelings with regular fiber o Flax Seed - a less gassy fiber than psyllium   No reading or other relaxing activity while on the toilet. If bowel movements take longer than 5 minutes, you are too constipated   AVOID CONSTIPATION.  High fiber and water intake usually takes care of this.  Sometimes a laxative is needed to stimulate more frequent bowel movements, but    Laxatives are not a good long-term solution as it can wear the colon out. o Osmotics (Milk of Magnesia, Fleets phosphosoda, Magnesium citrate, MiraLax, GoLytely) are safer than  o Stimulants (Senokot, Castor Oil, Dulcolax, Ex Lax)    o Do not take laxatives for more than 7days in a row.    IF SEVERELY CONSTIPATED, try a Bowel Retraining Program: o Do not  use laxatives.  o Eat a diet high in roughage, such as bran cereals and leafy vegetables.  o Drink six (6) ounces of prune or apricot juice each morning.  o Eat two (2) large servings of stewed fruit each day.  o Take one (1) heaping tablespoon of a psyllium-based bulking agent twice a day. Use sugar-free sweetener when possible to avoid excessive calories.  o Eat a normal breakfast.  o Set aside 15 minutes after breakfast to sit on the toilet, but do not strain to have a bowel movement.  o If you do not have a bowel movement by the third day, use an enema and repeat the above steps.

## 2013-11-09 ENCOUNTER — Ambulatory Visit (INDEPENDENT_AMBULATORY_CARE_PROVIDER_SITE_OTHER): Payer: BC Managed Care – PPO | Admitting: Family Medicine

## 2013-11-09 ENCOUNTER — Encounter: Payer: Self-pay | Admitting: Family Medicine

## 2013-11-09 ENCOUNTER — Other Ambulatory Visit (HOSPITAL_COMMUNITY)
Admission: RE | Admit: 2013-11-09 | Discharge: 2013-11-09 | Disposition: A | Discharge status: Home / Self Care | Payer: BC Managed Care – PPO | Source: Ambulatory Visit | Attending: Family Medicine | Admitting: Family Medicine

## 2013-11-09 DIAGNOSIS — Z0000 Encounter for general adult medical examination without abnormal findings: Secondary | ICD-10-CM | POA: Insufficient documentation

## 2013-11-09 DIAGNOSIS — Z20828 Contact with and (suspected) exposure to other viral communicable diseases: Secondary | ICD-10-CM

## 2013-11-09 DIAGNOSIS — G43009 Migraine without aura, not intractable, without status migrainosus: Secondary | ICD-10-CM

## 2013-11-09 DIAGNOSIS — Z113 Encounter for screening for infections with a predominantly sexual mode of transmission: Secondary | ICD-10-CM | POA: Insufficient documentation

## 2013-11-09 DIAGNOSIS — I10 Essential (primary) hypertension: Secondary | ICD-10-CM

## 2013-11-09 DIAGNOSIS — G43909 Migraine, unspecified, not intractable, without status migrainosus: Secondary | ICD-10-CM

## 2013-11-09 DIAGNOSIS — Z205 Contact with and (suspected) exposure to viral hepatitis: Secondary | ICD-10-CM | POA: Insufficient documentation

## 2013-11-09 DIAGNOSIS — Z202 Contact with and (suspected) exposure to infections with a predominantly sexual mode of transmission: Secondary | ICD-10-CM

## 2013-11-09 DIAGNOSIS — B372 Candidiasis of skin and nail: Secondary | ICD-10-CM | POA: Insufficient documentation

## 2013-11-09 LAB — LIPID PANEL
Cholesterol: 165 mg/dL (ref 0–200)
HDL: 91 mg/dL (ref >39)
LDL Cholesterol: 61 mg/dL (ref 0–99)
TRIGLYCERIDES: 64 mg/dL (ref <150)
Total CHOL/HDL Ratio: 1.8 Ratio
VLDL: 13 mg/dL (ref 0–40)

## 2013-11-09 LAB — TSH: TSH: 1.316 u[IU]/mL (ref 0.350–4.500)

## 2013-11-09 LAB — COMPREHENSIVE METABOLIC PANEL
ALK PHOS: 32 U/L — AB (ref 39–117)
ALT: 14 U/L (ref 0–35)
AST: 19 U/L (ref 0–37)
Albumin: 3.8 g/dL (ref 3.5–5.2)
BILIRUBIN TOTAL: 0.3 mg/dL (ref 0.3–1.2)
BUN: 8 mg/dL (ref 6–23)
CO2: 25 mEq/L (ref 19–32)
CREATININE: 0.71 mg/dL (ref 0.50–1.10)
Calcium: 8.7 mg/dL (ref 8.4–10.5)
Chloride: 103 mEq/L (ref 96–112)
Glucose, Bld: 81 mg/dL (ref 70–99)
Potassium: 3.7 mEq/L (ref 3.5–5.3)
Sodium: 136 mEq/L (ref 135–145)
Total Protein: 6.5 g/dL (ref 6.0–8.3)

## 2013-11-09 MED ORDER — SUMATRIPTAN SUCCINATE 50 MG PO TABS: 50.0000 mg | ORAL_TABLET | Freq: Once | ORAL | Status: DC

## 2013-11-09 MED ORDER — KETOCONAZOLE 2 % EX CREA: 1.0000 "application " | TOPICAL_CREAM | Freq: Two times a day (BID) | CUTANEOUS | Status: DC

## 2013-11-09 NOTE — Assessment & Plan Note (Signed)
Assessment: Patient with bilateral inframammary rash consistent with candidal intertrigo Plan: ketoconazole 2% cream twice a day x7 days; return to clinic if not improved

## 2013-11-09 NOTE — Progress Notes (Signed)
   Subjective:    Patient ID: Barbara Dunn, female    DOB: [birth year], 43 y.o.   MRN: [id number]  HPI Pt here for annual wellness exam and rash.   Annual Exam:   Cancer Screening: - Cervical - last recorded 04/12/12, but only have results from 04/2011, therfore, recommend repeat in July 2015 unless pathology reports obtained  - Breast - Pt without history of mammogram, no personal or known fam hx of breast cancer  Immunizations - up to date  Rash  Location: chest, anterior Duration: 2 weeks Character: no itching or burning Changes: worsening, changing color Treatments tried: tried coco butter without help History of similar rash: none Exposure/Infestation Concern: no  Red Flags: New Drug: no Mucocutaneous Involvement: no Fever/Systemic Illness: no     Hepatitis C Exposure - Pt is concerned that she may be been exposed to Hepatitis C. She states that she was informed by current spouse of previous partner, that he tested positive for hepatitis C virus (no further details given) - Pt is sexually active with one partner and note intermittent discharge, has hx of chlamydia treated in her 56s, has not been tested for STI since 2012 and would like  "every test"   Review of Systems See HPI    Objective:   Physical Exam BP 144/90  Pulse 87  Temp(Src) 97.2 F (36.2 C) (Oral)  Ht 5\' 2"  (1.575 m)  Wt 215 lb (97.523 kg)  BMI 39.31 kg/m2  LMP 10/26/2013 Gen: young obese AA female, well appearing, NAD, pleasant and conversant HEENT: NCAT, PERRLA, EOMI, OP clear and moist, no lymphadenopathy, no thyroid tenderness, enlargement, or nodules CV: RRR, no m/r/g, no JVD or carotid bruits Pulm: normal WOB, CTA-B Breast: symmetric appearance, homogenous dense tissues palpated without appreciable masses, no tenderness or nipple drainage  Abd: soft, NDNT, NABS Extremities: no edema or joint tenderness Skin: hyperpigmented patches under breast bilaterally without satellite lesion, no  scaling Neuro/Psych: A&Ox4, normal affect, speech, and thought content     Assessment & Plan:
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
## 2013-11-09 NOTE — Assessment & Plan Note (Addendum)
Breast cancer screening after an individual is consideration, it is not recommended for breast cancer screening for this patient given the density of her breast tissue in the lack of personal or family history of breast cancer; this is along with the [place] preventive services task force recommendation  Cervical cancer screening recommended July 2015  Immunizations up to date  Obtain lipid profile, CBC and CMP

## 2013-11-09 NOTE — Patient Instructions (Addendum)
Barbara Dunn,   It was great to see you. Please read below about the issues that we discussed.   1. Breast Cancer Screening - Given your age and no family hx of early breast cancer and also because your breast tissue is very dense, I do not recommend a mammogram this year. It should be readdressed each year.   2. Rash - I think this is a form of fungus. I sent a prescription for a cream to your pharmacy to be used twice a day for 7 days. If it is not better, then please come back.   3. Labs - I will send you a letter with the labs and contact your about anything that we need to follow up on.   Please follow up in 1 month to discuss your blood pressure.   Sincerely,   Dr. Maricela Bo

## 2013-11-10 LAB — RPR

## 2013-11-10 LAB — HIV ANTIBODY (ROUTINE TESTING W REFLEX): HIV: NONREACTIVE

## 2013-11-10 LAB — HEPATITIS C ANTIBODY: HCV Ab: NEGATIVE

## 2013-11-11 ENCOUNTER — Encounter: Payer: Self-pay | Admitting: Family Medicine

## 2013-11-11 ENCOUNTER — Telehealth: Payer: Self-pay | Admitting: Family Medicine
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
## 2013-11-12 NOTE — Telephone Encounter (Signed)
I have not received any forms..

## 2013-11-12 NOTE — Telephone Encounter (Signed)
Will fwd. To PCP for review. Barbara Dunn, Gerrit Heck (did you receive letter?)

## 2013-11-12 NOTE — Telephone Encounter (Signed)
Called pt.LMVM to call back. Please see message. Javier Glazier, Gerrit Heck

## 2013-11-16 NOTE — Telephone Encounter (Signed)
LMVM for patient that no forms received.  Ameera Tigue, Loralyn Freshwater, [place]

## 2013-12-17 ENCOUNTER — Ambulatory Visit: Payer: BC Managed Care – PPO | Admitting: Family Medicine

## 2014-02-11 ENCOUNTER — Emergency Department (HOSPITAL_COMMUNITY)
Admission: EM | Admit: 2014-02-11 | Discharge: 2014-02-11 | Disposition: A | Discharge status: Home / Self Care | Payer: BC Managed Care – PPO | Source: Home / Self Care | Attending: Family Medicine | Admitting: Family Medicine

## 2014-02-11 ENCOUNTER — Encounter (HOSPITAL_COMMUNITY): Payer: Self-pay | Admitting: Emergency Medicine

## 2014-02-11 DIAGNOSIS — G43909 Migraine, unspecified, not intractable, without status migrainosus: Secondary | ICD-10-CM

## 2014-02-11 MED ORDER — SUMATRIPTAN SUCCINATE 6 MG/0.5ML ~~LOC~~ SOLN
SUBCUTANEOUS | Status: AC
  Filled 2014-02-11: qty 0.5

## 2014-02-11 MED ORDER — KETOROLAC TROMETHAMINE 60 MG/2ML IM SOLN
INTRAMUSCULAR | Status: AC
  Filled 2014-02-11: qty 2

## 2014-02-11 MED ORDER — SUMATRIPTAN SUCCINATE 6 MG/0.5ML ~~LOC~~ SOLN
6.0000 mg | Freq: Once | SUBCUTANEOUS | Status: AC
  Administered 2014-02-11: 6 mg via SUBCUTANEOUS

## 2014-02-11 MED ORDER — DEXAMETHASONE SODIUM PHOSPHATE 10 MG/ML IJ SOLN
INTRAMUSCULAR | Status: AC
  Filled 2014-02-11: qty 1

## 2014-02-11 MED ORDER — METOCLOPRAMIDE HCL 5 MG/ML IJ SOLN
5.0000 mg | Freq: Once | INTRAMUSCULAR | Status: AC
  Administered 2014-02-11: 5 mg via INTRAMUSCULAR

## 2014-02-11 MED ORDER — METOCLOPRAMIDE HCL 5 MG/ML IJ SOLN
INTRAMUSCULAR | Status: AC
  Filled 2014-02-11: qty 2

## 2014-02-11 MED ORDER — DEXAMETHASONE SODIUM PHOSPHATE 10 MG/ML IJ SOLN
10.0000 mg | Freq: Once | INTRAMUSCULAR | Status: AC
  Administered 2014-02-11: 10 mg via INTRAMUSCULAR

## 2014-02-11 MED ORDER — KETOROLAC TROMETHAMINE 60 MG/2ML IM SOLN
60.0000 mg | Freq: Once | INTRAMUSCULAR | Status: AC
  Administered 2014-02-11: 60 mg via INTRAMUSCULAR

## 2014-02-11 NOTE — Discharge Instructions (Signed)
Thank you for coming in today. Followup with your primary care Dr. Daphane Shepherd to the emergency room if your headache becomes excruciating or you have weakness or numbness or uncontrolled vomiting.   Migraine Headache A migraine headache is an intense, throbbing pain on one or both sides of your head. A migraine can last for 30 minutes to several hours. CAUSES  The exact cause of a migraine headache is not always known. However, a migraine may be caused when nerves in the brain become irritated and release chemicals that cause inflammation. This causes pain. Certain things may also trigger migraines, such as:  Alcohol.  Smoking.  Stress.  Menstruation.  Aged cheeses.  Foods or drinks that contain nitrates, glutamate, aspartame, or tyramine.  Lack of sleep.  Chocolate.  Caffeine.  Hunger.  Physical exertion.  Fatigue.  Medicines used to treat chest pain (nitroglycerine), birth control pills, estrogen, and some blood pressure medicines. SIGNS AND SYMPTOMS  Pain on one or both sides of your head.  Pulsating or throbbing pain.  Severe pain that prevents daily activities.  Pain that is aggravated by any physical activity.  Nausea, vomiting, or both.  Dizziness.  Pain with exposure to bright lights, loud noises, or activity.  General sensitivity to bright lights, loud noises, or smells. Before you get a migraine, you may get warning signs that a migraine is coming (aura). An aura may include:  Seeing flashing lights.  Seeing bright spots, halos, or zig-zag lines.  Having tunnel vision or blurred vision.  Having feelings of numbness or tingling.  Having trouble talking.  Having muscle weakness. DIAGNOSIS  A migraine headache is often diagnosed based on:  Symptoms.  Physical exam.  A CT scan or MRI of your head. These imaging tests cannot diagnose migraines, but they can help rule out other causes of headaches. TREATMENT Medicines may be given for pain and  nausea. Medicines can also be given to help prevent recurrent migraines.  HOME CARE INSTRUCTIONS  Only take over-the-counter or prescription medicines for pain or discomfort as directed by your health care provider. The use of long-term narcotics is not recommended.  Lie down in a dark, quiet room when you have a migraine.  Keep a journal to find out what may trigger your migraine headaches. For example, write down:  What you eat and drink.  How much sleep you get.  Any change to your diet or medicines.  Limit alcohol consumption.  Quit smoking if you smoke.  Get 7 9 hours of sleep, or as recommended by your health care provider.  Limit stress.  Keep lights dim if bright lights bother you and make your migraines worse. SEEK IMMEDIATE MEDICAL CARE IF:   Your migraine becomes severe.  You have a fever.  You have a stiff neck.  You have vision loss.  You have muscular weakness or loss of muscle control.  You start losing your balance or have trouble walking.  You feel faint or pass out.  You have severe symptoms that are different from your first symptoms. MAKE SURE YOU:   Understand these instructions.  Will watch your condition.  Will get help right away if you are not doing well or get worse. Document Released: 10/08/2005 Document Revised: 07/29/2013 Document Reviewed: 06/15/2013 [facility] Patient Information 2014 [employer].

## 2014-02-11 NOTE — ED Notes (Signed)
Patient states here for headache Stated the headache started yesterday and is not getting any relief  With her migraine medication

## 2014-02-11 NOTE — ED Provider Notes (Signed)
Barbara Dunn is a 43 y.o. female who presents to Urgent Care today for headache. Patient has had a persistent headache since yesterday. She notes photophobia an intense bandlike and throbbing sensation bilaterally. Her symptoms are consistent with prior episodes of migraine. She's tried Imitrex which have not helped. She denies any weakness or numbness loss of function or blurry vision. She feels well otherwise.   Past Medical History  Diagnosis Date  . Hypertension   . Anemia   . Migraines   . Obesity   . Arthritis    History  Substance Use Topics  . Smoking status: Former Smoker    Quit date: 12/15/2003  . Smokeless tobacco: Never Used  . Alcohol Use: No   ROS as above Medications: No current facility-administered medications for this encounter.   Current Outpatient Prescriptions  Medication Sig Dispense Refill  . celecoxib (CELEBREX) 100 MG capsule Take 1 capsule (100 mg total) by mouth 2 (two) times daily as needed.  30 capsule  0  . cetirizine (ZYRTEC) 10 MG tablet Take 10 mg by mouth daily.        . fluticasone (FLONASE) 50 MCG/ACT nasal spray 2 sprays by Nasal route daily. 2 sprays each nostril daily  16 g  6  . ibuprofen (ADVIL,MOTRIN) 800 MG tablet Take 1 tablet (800 mg total) by mouth 3 (three) times daily.  21 tablet  0  . ketoconazole (NIZORAL) 2 % cream Apply 1 application topically 2 (two) times daily.  60 g  0  . lisinopril (PRINIVIL,ZESTRIL) 20 MG tablet Take 1 tablet (20 mg total) by mouth daily.  30 tablet  5  . norgestimate-ethinyl estradiol (SPRINTEC 28) 0.25-35 MG-MCG tablet Take 1 tablet by mouth daily. 1 tab by mouth daily as directed. dispense: 3 months  30 tablet  11  . SUMAtriptan (IMITREX) 50 MG tablet Take 1 tablet (50 mg total) by mouth once. Take at first sign of headache.  10 tablet  1    Exam:  BP 150/84  Pulse 85  Temp(Src) 98.4 F (36.9 C) (Oral)  Resp 15  SpO2 99% Gen: Well NAD HEENT: EOMI,  MMM PERRLA. Funduscopic exam is normal the left.  Technically difficult on the right  Lungs: Normal work of breathing. CTABL Heart: RRR no MRG Abd: NABS, Soft. NT, ND Exts: Brisk capillary refill, warm and well perfused.  Neuro: Alert and oriented. Cranial nerves II through XII are intact. Normal gait. Normal balance. Normal coordination strength reflexes and sensation.  No results found for this or any previous visit (from the past 24 hour(s)). No results found.  Assessment and Plan: 43 y.o. female with migraine. Plan to treat with injectable Toradol, dexamethasone, Reglan, and Imitrex.  Discussed warning signs or symptoms. Please see discharge instructions. Patient expresses understanding.    Gregor Hams, MD 02/11/14 2053

## 2014-02-18 ENCOUNTER — Encounter: Payer: Self-pay | Admitting: Family Medicine

## 2014-02-18 ENCOUNTER — Ambulatory Visit (INDEPENDENT_AMBULATORY_CARE_PROVIDER_SITE_OTHER): Payer: BC Managed Care – PPO | Admitting: Family Medicine

## 2014-02-18 VITALS — BP 139/93 | HR 84 | Temp 98.5°F | Ht 62.0 in | Wt 209.4 lb

## 2014-02-18 DIAGNOSIS — B9789 Other viral agents as the cause of diseases classified elsewhere: Secondary | ICD-10-CM

## 2014-02-18 DIAGNOSIS — G43909 Migraine, unspecified, not intractable, without status migrainosus: Secondary | ICD-10-CM

## 2014-02-18 DIAGNOSIS — J069 Acute upper respiratory infection, unspecified: Secondary | ICD-10-CM

## 2014-02-18 DIAGNOSIS — I10 Essential (primary) hypertension: Secondary | ICD-10-CM

## 2014-02-18 MED ORDER — SUMATRIPTAN SUCCINATE 50 MG PO TABS: 50.0000 mg | ORAL_TABLET | Freq: Once | ORAL | Status: DC

## 2014-02-18 MED ORDER — AMLODIPINE BESYLATE 5 MG PO TABS: 5.0000 mg | ORAL_TABLET | Freq: Every day | ORAL | Status: DC

## 2014-02-18 NOTE — Patient Instructions (Signed)
Dear Ms. Martindale,   Thank you for coming to clinic today. Please read below regarding the issues that we discussed.   1. Cold and cough - You should be feeling much better in the next couple days. I am sorry that there is nothing.   2. High Blood Pressure - We should start norvasc 5 mg each day.   Please follow up in clinic in 4 weeks. Please call earlier if you have any questions or concerns.   Sincerely,   Dr. Maricela Bo

## 2014-02-18 NOTE — Progress Notes (Signed)
   Subjective:    Patient ID: Barbara Dunn, female    DOB: 1971-04-02, 43 y.o.   MRN: [id number]  HPI  Hypertension  Home BP monitoring: yes - pt brings in log and consistently > 145 SBP and > 95 DBP  Office BP: BP Readings from Last 3 Encounters:  02/18/14 139/93  02/11/14 150/84  11/09/13 144/90    Prescribed meds: lisinopril 20 mg  Hypertension ROS:  Taking medications as prescribed:No Chest pain: No Shortness of breath: No Swelling of extremities: No TIA symptoms: Yes Regular exercise: No Low Na+ diet: No Alcohol/tobacco/drug use: No  URI Symptoms Cough: yes productive yes - yellow productive Runny Nose: yes Sore Throat: yes Sinus Pressure: yes Shortness of Breath: no Fever/Chills: no Nausea/Vomiting; no Diarrhea: no  Course: unchanged over 1 week Treatments Tried: cold and cough OTC meds Exacerbating: nothing    Current Outpatient Prescriptions on File Prior to Visit  Medication Sig Dispense Refill  . celecoxib (CELEBREX) 100 MG capsule Take 1 capsule (100 mg total) by mouth 2 (two) times daily as needed.  30 capsule  0  . cetirizine (ZYRTEC) 10 MG tablet Take 10 mg by mouth daily.        . fluticasone (FLONASE) 50 MCG/ACT nasal spray 2 sprays by Nasal route daily. 2 sprays each nostril daily  16 g  6  . ibuprofen (ADVIL,MOTRIN) 800 MG tablet Take 1 tablet (800 mg total) by mouth 3 (three) times daily.  21 tablet  0  . ketoconazole (NIZORAL) 2 % cream Apply 1 application topically 2 (two) times daily.  60 g  0  . lisinopril (PRINIVIL,ZESTRIL) 20 MG tablet Take 1 tablet (20 mg total) by mouth daily.  30 tablet  5  . norgestimate-ethinyl estradiol (SPRINTEC 28) 0.25-35 MG-MCG tablet Take 1 tablet by mouth daily. 1 tab by mouth daily as directed. dispense: 3 months  30 tablet  11  . SUMAtriptan (IMITREX) 50 MG tablet Take 1 tablet (50 mg total) by mouth once. Take at first sign of headache.  10 tablet  1   No current facility-administered medications on file  prior to visit.  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

## 2014-02-19 DIAGNOSIS — J069 Acute upper respiratory infection, unspecified: Secondary | ICD-10-CM | POA: Insufficient documentation

## 2014-02-19 DIAGNOSIS — B9789 Other viral agents as the cause of diseases classified elsewhere: Secondary | ICD-10-CM

## 2014-02-19 NOTE — Assessment & Plan Note (Signed)
No need for further eval. Symptomatic treatment only.

## 2014-02-19 NOTE — Assessment & Plan Note (Signed)
A: BP consistently > 150/90 P: add amlodipine 5 mg to lisinopril; consider stopping OCP

## 2014-03-02 ENCOUNTER — Other Ambulatory Visit: Payer: Self-pay | Admitting: Family Medicine

## 2014-03-23 ENCOUNTER — Ambulatory Visit: Payer: BC Managed Care – PPO | Admitting: Family Medicine

## 2014-04-18 ENCOUNTER — Other Ambulatory Visit: Payer: Self-pay | Admitting: Family Medicine

## 2014-04-20 ENCOUNTER — Encounter: Payer: Self-pay | Admitting: Family Medicine

## 2014-04-20 ENCOUNTER — Ambulatory Visit (INDEPENDENT_AMBULATORY_CARE_PROVIDER_SITE_OTHER): Payer: BC Managed Care – PPO | Admitting: Family Medicine

## 2014-04-20 VITALS — BP 120/79 | HR 73 | Ht 62.0 in | Wt 208.0 lb

## 2014-04-20 DIAGNOSIS — M25562 Pain in left knee: Secondary | ICD-10-CM | POA: Insufficient documentation

## 2014-04-20 DIAGNOSIS — M25569 Pain in unspecified knee: Secondary | ICD-10-CM | POA: Insufficient documentation

## 2014-04-20 DIAGNOSIS — R0602 Shortness of breath: Secondary | ICD-10-CM | POA: Insufficient documentation

## 2014-04-20 DIAGNOSIS — I10 Essential (primary) hypertension: Secondary | ICD-10-CM

## 2014-04-20 DIAGNOSIS — G43109 Migraine with aura, not intractable, without status migrainosus: Secondary | ICD-10-CM

## 2014-04-20 DIAGNOSIS — M25561 Pain in right knee: Secondary | ICD-10-CM

## 2014-04-20 DIAGNOSIS — L821 Other seborrheic keratosis: Secondary | ICD-10-CM | POA: Insufficient documentation

## 2014-04-20 DIAGNOSIS — L988 Other specified disorders of the skin and subcutaneous tissue: Secondary | ICD-10-CM

## 2014-04-20 MED ORDER — AMLODIPINE BESYLATE 5 MG PO TABS: 5.0000 mg | ORAL_TABLET | Freq: Every day | ORAL | Status: DC

## 2014-04-20 MED ORDER — SUMATRIPTAN SUCCINATE 50 MG PO TABS: 50.0000 mg | ORAL_TABLET | Freq: Once | ORAL | Status: DC

## 2014-04-20 MED ORDER — ALBUTEROL SULFATE HFA 108 (90 BASE) MCG/ACT IN AERS: 1.0000 | INHALATION_SPRAY | Freq: Four times a day (QID) | RESPIRATORY_TRACT | Status: DC | PRN

## 2014-04-20 NOTE — Assessment & Plan Note (Signed)
Pt told to follow up for this issue

## 2014-04-20 NOTE — Patient Instructions (Addendum)
Ms. Hogle,   Please read below about the various things that we discussed.   1. Blood pressure - Continue the current medications. I have sent refills to the pharmacy.   2. Breathing Issues - You need to have lung function testing, and, considering inhaled steroids or sinuglair. Please schedule an appointment with Dr. Valentina Lucks in our clinic for breathing tests.   3. Handicap placard - I cannot legally write you for a pass since you are not handicapped and your clients are not my patients. You need to take my letter to the [facility] or just drop the patient off at the front of the office and then park in a regular spot.   4. Dermatology Issues - Please call Dr. Eula Listen office for a Dermatology appt.  They accept [employer] and do not need a referral. Their info is: [address], [place], [place] [address] [phone]  5. Knee - Follow up with your new doctor, Dr. Teryl Lucy, to discuss this. It could be an issue with your meniscus.   It was a pleasure to be your doctor.   Sincerely,   Dr. Maricela Bo

## 2014-04-20 NOTE — Progress Notes (Signed)
Subjective:    Patient ID: Barbara Dunn, female    DOB: [birth year], 43 y.o.   MRN: [id number]  HPI  Hypertension  Home BP monitoring: no  Office BP: BP Readings from Last 3 Encounters:  04/20/14 120/79  02/18/14 139/93  02/11/14 150/84   Prescribed meds: norvasc 5 mg added at last visit, still on lisinopril 20 mg   Hypertension ROS:  Taking medications as prescribed:Yes Chest pain: No Shortness of breath: No Swelling of extremities: No TIA symptoms: No Regular exercise: No Low Na+ diet: No Alcohol/tobacco/drug use: No   Shortness of breath - Pt thinks that her allergies as causing her to have asthma and shortness of breath. She is using albuterol inhaler several times a week and even used her sisters nebulizer for relief. She denies chest pain or dyspnea at rest. She is taking cetrizine for allergies. No fever, chills or hemoptysis. No smoking.   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 She has tried to use the placard of her clients, but has received multiple citations for this. She would like a solution.   Current Outpatient Prescriptions on File Prior to Visit  Medication Sig Dispense Refill  . albuterol (PROVENTIL HFA;VENTOLIN HFA) 108 (90 BASE) MCG/ACT inhaler Inhale 1 puff into the lungs every 6 (six) hours as needed for wheezing or shortness of breath.      Marland Kitchen amLODipine (NORVASC) 5 MG tablet Take 1 tablet (5 mg total) by mouth daily.  30 tablet  1  . celecoxib (CELEBREX) 100 MG capsule Take 1 capsule (100 mg total) by mouth 2 (two) times daily as needed.  30 capsule  0  . cetirizine (ZYRTEC) 10 MG tablet Take 10 mg by mouth daily.        . fluticasone (FLONASE) 50 MCG/ACT nasal spray 2 sprays by Nasal route daily. 2 sprays each nostril daily  16 g  6  . ibuprofen (ADVIL,MOTRIN) 800 MG tablet Take 1 tablet (800 mg total) by mouth 3 (three) times daily.  21 tablet  0  .  ketoconazole (NIZORAL) 2 % cream Apply 1 application topically 2 (two) times daily.  60 g  0  . lisinopril (PRINIVIL,ZESTRIL) 20 MG tablet TAKE 1 TABLET BY MOUTH EVERY DAY  30 tablet  5  . norgestimate-ethinyl estradiol (SPRINTEC 28) 0.25-35 MG-MCG tablet Take 1 tablet by mouth daily. 1 tab by mouth daily as directed. dispense: 3 months  30 tablet  11  . SUMAtriptan (IMITREX) 50 MG tablet Take 1 tablet (50 mg total) by mouth once. Take at first sign of headache.  10 tablet  1   No current facility-administered medications on file prior to visit.     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 I did give her a letter to take to the [facility] in case there is another option for caregivers  of elderly patients.

## 2014-04-20 NOTE — Assessment & Plan Note (Signed)
Given contact information for dermatology since she is bothered by this

## 2014-04-20 NOTE — Assessment & Plan Note (Signed)
A: pt with no signs of infection or inflammation of the lungs today but certainly sounds like she has RAD needing escalated care with steroid inhaler and/or singulair P: recommended to follow up with Dr. Valentina Lucks for PFTs

## 2014-04-20 NOTE — Assessment & Plan Note (Signed)
Improved control with norvasc added to ACE-I. Continue treatment.

## 2014-04-27 ENCOUNTER — Other Ambulatory Visit: Payer: Self-pay | Admitting: Family Medicine

## 2014-04-30 ENCOUNTER — Other Ambulatory Visit: Payer: Self-pay | Admitting: *Deleted

## 2014-08-16 ENCOUNTER — Encounter (HOSPITAL_COMMUNITY): Payer: Self-pay | Admitting: Emergency Medicine

## 2014-08-16 ENCOUNTER — Emergency Department (HOSPITAL_COMMUNITY): Payer: BC Managed Care – PPO

## 2014-08-16 ENCOUNTER — Emergency Department (HOSPITAL_COMMUNITY)
Admission: EM | Admit: 2014-08-16 | Discharge: 2014-08-17 | Disposition: A | Discharge status: Home / Self Care | Payer: BC Managed Care – PPO | Attending: Emergency Medicine | Admitting: Emergency Medicine

## 2014-08-16 DIAGNOSIS — I10 Essential (primary) hypertension: Secondary | ICD-10-CM | POA: Insufficient documentation

## 2014-08-16 DIAGNOSIS — Z7951 Long term (current) use of inhaled steroids: Secondary | ICD-10-CM | POA: Diagnosis not present

## 2014-08-16 DIAGNOSIS — E669 Obesity, unspecified: Secondary | ICD-10-CM | POA: Insufficient documentation

## 2014-08-16 DIAGNOSIS — G43909 Migraine, unspecified, not intractable, without status migrainosus: Secondary | ICD-10-CM | POA: Diagnosis not present

## 2014-08-16 DIAGNOSIS — Z79899 Other long term (current) drug therapy: Secondary | ICD-10-CM | POA: Diagnosis not present

## 2014-08-16 DIAGNOSIS — M1990 Unspecified osteoarthritis, unspecified site: Secondary | ICD-10-CM | POA: Diagnosis not present

## 2014-08-16 DIAGNOSIS — Z862 Personal history of diseases of the blood and blood-forming organs and certain disorders involving the immune mechanism: Secondary | ICD-10-CM | POA: Diagnosis not present

## 2014-08-16 DIAGNOSIS — Z87891 Personal history of nicotine dependence: Secondary | ICD-10-CM | POA: Diagnosis not present

## 2014-08-16 DIAGNOSIS — R079 Chest pain, unspecified: Secondary | ICD-10-CM

## 2014-08-16 LAB — BASIC METABOLIC PANEL
Anion gap: 12 (ref 5–15)
BUN: 8 mg/dL (ref 6–23)
CALCIUM: 9.2 mg/dL (ref 8.4–10.5)
CO2: 24 meq/L (ref 19–32)
Chloride: 104 mEq/L (ref 96–112)
Creatinine, Ser: 0.8 mg/dL (ref 0.50–1.10)
GFR calc Af Amer: >90 mL/min (ref >90)
GFR calc non Af Amer: >90 mL/min (ref >90)
GLUCOSE: 84 mg/dL (ref 70–99)
Potassium: 3.5 mEq/L — ABNORMAL LOW (ref 3.7–5.3)
Sodium: 140 mEq/L (ref 137–147)

## 2014-08-16 LAB — CBC
HEMATOCRIT: 36.6 % (ref 36.0–46.0)
Hemoglobin: 12.4 g/dL (ref 12.0–15.0)
MCH: 29.8 pg (ref 26.0–34.0)
MCHC: 33.9 g/dL (ref 30.0–36.0)
MCV: 88 fL (ref 78.0–100.0)
Platelets: 232 10*3/uL (ref 150–400)
RBC: 4.16 MIL/uL (ref 3.87–5.11)
RDW: 13.1 % (ref 11.5–15.5)
WBC: 7.8 10*3/uL (ref 4.0–10.5)

## 2014-08-16 LAB — I-STAT TROPONIN, ED: TROPONIN I, POC: 0.01 ng/mL (ref 0.00–0.08)

## 2014-08-16 MED ORDER — KETOROLAC TROMETHAMINE 30 MG/ML IJ SOLN
30.0000 mg | Freq: Once | INTRAMUSCULAR | Status: AC
  Administered 2014-08-17: 30 mg via INTRAVENOUS
  Filled 2014-08-16: qty 1

## 2014-08-16 NOTE — ED Notes (Signed)
Pt ibn c/o central chest pain that started a few hours ago, pt reports that yesterday she had an episode of right arm numbness and states she had trouble using her arm at that time, this resolved on its own, today she is c/o right arm pain with her chest pain, no distress noted

## 2014-08-16 NOTE — ED Provider Notes (Signed)
CSN: [id number]     Arrival date & time 08/16/14  1905 History   First MD Initiated Contact with Patient 08/16/14 2321     Chief Complaint  Patient presents with  . Chest Pain     (Consider location/radiation/quality/duration/timing/severity/associated sxs/prior Treatment) HPI  This is a 43 year old female with a history of hypertension who presents with chest pain. Patient reports onset of chest pain at 5 PM today. She states that it is dull and constant. Nothing makes it better or worse. It does radiate into her right arm. Currently her pain is 4 out of 10. She denies any shortness of breath, diaphoresis, fever. Denies any history of hyperlipidemia or early family history of heart disease. She is a former smoker. Denies any injury.  Patient denies any history of blood clots, recent leg swelling, recent hospitalization or prolonged travel..  Past Medical History  Diagnosis Date  . Hypertension   . Anemia   . Migraines   . Obesity   . Arthritis    Past Surgical History  Procedure Laterality Date  . Cervix lesion destruction    . Tubal ligation    . Tubal ligation     Family History  Problem Relation Age of Onset  . Diabetes Mother   . Hypertension Mother   . Schizophrenia Sister   . Schizophrenia Brother    History  Substance Use Topics  . Smoking status: Former Smoker    Quit date: 12/15/2003  . Smokeless tobacco: Never Used  . Alcohol Use: No   OB History   Grav Para Term Preterm Abortions TAB SAB Ect Mult Living                 Review of Systems  Constitutional: Negative for fever.  Respiratory: Positive for chest tightness. Negative for cough and shortness of breath.   Cardiovascular: Negative for chest pain and leg swelling.  Gastrointestinal: Negative for nausea, vomiting and abdominal pain.  Genitourinary: Negative for dysuria.  Skin: Negative for rash.  Neurological: Negative for headaches.  All other systems reviewed and are  negative.     Allergies  Review of patient's allergies indicates no known allergies.  Home Medications   Prior to Admission medications   Medication Sig Start Date End Date Taking? Authorizing Provider  albuterol (PROVENTIL HFA;VENTOLIN HFA) 108 (90 BASE) MCG/ACT inhaler Inhale 1 puff into the lungs every 6 (six) hours as needed for wheezing or shortness of breath.   Yes Historical Provider, MD  amLODipine (NORVASC) 5 MG tablet Take 5 mg by mouth daily.   Yes Historical Provider, MD  fluticasone (FLONASE) 50 MCG/ACT nasal spray Place 2 sprays into both nostrils daily.   Yes Historical Provider, MD  lisinopril (PRINIVIL,ZESTRIL) 20 MG tablet Take 20 mg by mouth daily.   Yes Historical Provider, MD  SUMAtriptan (IMITREX) 50 MG tablet Take 50 mg by mouth every 2 (two) hours as needed for migraine or headache. May repeat in 2 hours if headache persists or recurs.   Yes Historical Provider, MD  HYDROcodone-acetaminophen (NORCO/VICODIN) 5-325 MG per tablet Take 1 tablet by mouth every 6 (six) hours as needed for moderate pain or severe pain. 08/17/14   Merryl Hacker, MD  ibuprofen (ADVIL,MOTRIN) 600 MG tablet Take 1 tablet (600 mg total) by mouth every 6 (six) hours as needed. 08/17/14   Merryl Hacker, MD   BP 113/88  Pulse 85  Temp(Src) 98 F (36.7 C) (Oral)  Resp 16  SpO2 100% Physical Exam  Nursing  note and vitals reviewed. Constitutional: She is oriented to person, place, and time. She appears well-developed and well-nourished. No distress.  HENT:  Head: Normocephalic and atraumatic.  Cardiovascular: Normal rate, regular rhythm and normal heart sounds.   Pulmonary/Chest: Effort normal. No respiratory distress. She has no wheezes. She exhibits tenderness.  Abdominal: Soft. Bowel sounds are normal. There is no tenderness. There is no rebound.  Musculoskeletal: She exhibits no edema.  Neurological: She is alert and oriented to person, place, and time.  Skin: Skin is warm and  dry.  Psychiatric: She has a normal mood and affect.    ED Course  Procedures (including critical care time) Labs Review Labs Reviewed  BASIC METABOLIC PANEL - Abnormal; Notable for the following:    Potassium 3.5 (*)    All other components within normal limits  CBC  D-DIMER, QUANTITATIVE  I-STAT TROPOININ, ED  Randolm Idol, ED    Imaging Review Dg Chest 2 View  08/16/2014   CLINICAL DATA:  Chest pain and tightness.  EXAM: CHEST  2 VIEW  COMPARISON:  02/08/2010 CT and radiograph  FINDINGS: The heart size and mediastinal contours are within normal limits. Both lungs are clear. The visualized skeletal structures are unremarkable.  IMPRESSION: No radiographic evidence of active cardiopulmonary disease.   Electronically Signed   By: Carlos Levering M.D.   On: 08/16/2014 21:00     EKG Interpretation   Date/Time:  Monday August 16 2014 19:13:30 EDT Ventricular Rate:  87 PR Interval:  156 QRS Duration: 72 QT Interval:  384 QTC Calculation: 462 R Axis:   48 Text Interpretation:  Normal sinus rhythm Normal ECG Confirmed by Ka Flammer   MD, Charlayne Vultaggio ([id number]) on 08/16/2014 11:34:42 PM      MDM   Final diagnoses:  Chest pain, unspecified chest pain type   Patient presents with chest pain. Onset of symptoms at 5 PM. They have been constant. Story is a stable typical for ACS. Patient does have reproducible pain on exam. Also complains of right arm pain with some swelling. EKG is normal. Chest x-ray is reassuring. Lab work including d-dimer is also reassuring. Patient does report some improvement with IM Toradol. Will have patient follow-up with her primary physician. Suspect musculoskeletal etiology.  After history, exam, and medical workup I feel the patient has been appropriately medically screened and is safe for discharge home. Pertinent diagnoses were discussed with the patient. Patient was given return precautions.     Merryl Hacker, MD 08/17/14 [phone]

## 2014-08-17 LAB — I-STAT TROPONIN, ED: Troponin i, poc: 0.01 ng/mL (ref 0.00–0.08)

## 2014-08-17 LAB — D-DIMER, QUANTITATIVE (NOT AT ARMC)

## 2014-08-17 MED ORDER — HYDROCODONE-ACETAMINOPHEN 5-325 MG PO TABS: 1.0000 | ORAL_TABLET | Freq: Four times a day (QID) | ORAL | Status: DC | PRN

## 2014-08-17 MED ORDER — IBUPROFEN 600 MG PO TABS: 600.0000 mg | ORAL_TABLET | Freq: Four times a day (QID) | ORAL | Status: DC | PRN

## 2014-08-17 MED ORDER — IBUPROFEN 200 MG PO TABS
600.0000 mg | ORAL_TABLET | Freq: Once | ORAL | Status: AC
  Administered 2014-08-17: 600 mg via ORAL
  Filled 2014-08-17 (×2): qty 1

## 2014-08-17 NOTE — Discharge Instructions (Signed)

## 2014-08-30 ENCOUNTER — Other Ambulatory Visit: Payer: Self-pay | Admitting: *Deleted

## 2014-08-30 DIAGNOSIS — I10 Essential (primary) hypertension: Secondary | ICD-10-CM

## 2014-08-30 MED ORDER — LISINOPRIL 20 MG PO TABS: 20.0000 mg | ORAL_TABLET | Freq: Every day | ORAL | Status: DC

## 2014-08-30 NOTE — Telephone Encounter (Signed)
Spoke with patient and informed her that rx was sent in.

## 2014-09-03 ENCOUNTER — Ambulatory Visit
Admission: RE | Admit: 2014-09-03 | Discharge: 2014-09-03 | Disposition: A | Discharge status: Home / Self Care | Payer: BC Managed Care – PPO | Source: Ambulatory Visit | Attending: Family Medicine | Admitting: Family Medicine

## 2014-09-03 ENCOUNTER — Encounter: Payer: Self-pay | Admitting: Family Medicine

## 2014-09-03 ENCOUNTER — Ambulatory Visit (INDEPENDENT_AMBULATORY_CARE_PROVIDER_SITE_OTHER): Payer: BC Managed Care – PPO | Admitting: *Deleted

## 2014-09-03 ENCOUNTER — Telehealth: Payer: Self-pay | Admitting: Family Medicine

## 2014-09-03 ENCOUNTER — Ambulatory Visit (INDEPENDENT_AMBULATORY_CARE_PROVIDER_SITE_OTHER): Payer: BC Managed Care – PPO | Admitting: Family Medicine

## 2014-09-03 ENCOUNTER — Other Ambulatory Visit: Payer: Self-pay

## 2014-09-03 VITALS — BP 138/84 | HR 81 | Ht 62.0 in | Wt 212.0 lb

## 2014-09-03 DIAGNOSIS — Z23 Encounter for immunization: Secondary | ICD-10-CM | POA: Diagnosis not present

## 2014-09-03 DIAGNOSIS — M25421 Effusion, right elbow: Secondary | ICD-10-CM

## 2014-09-03 DIAGNOSIS — M79601 Pain in right arm: Secondary | ICD-10-CM

## 2014-09-03 DIAGNOSIS — G540 Brachial plexus disorders: Secondary | ICD-10-CM | POA: Insufficient documentation

## 2014-09-03 NOTE — Assessment & Plan Note (Signed)
Full arm is swollen when compared to right.  Recently stopped OCPs. Less liklihood of UE clot -- but will obtain U/S to ensure.  More likely stress/overuse.  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  She is Right handed. If doppler negative, refer to Sports med for UE U/S.  I assume her pain will begin to localize by that visit.   OTC analgesics, heat, and rest as much as possible in meantime.

## 2014-09-03 NOTE — Progress Notes (Signed)
Subjective:    Barbara Dunn is a 43 y.o. female who presents to [facility] today for chest pain:  1.  FU for ED visit for CP:  Presented to ED 10/26 for chest pain.  Described as dull and constant.  Reproducible on exam.  EKG normal and normal CXR.  D-dimer reassuring.  Diagnosed as MSK component.    Since leaving ED: CP has completely resolved.  No trouble with dyspnea.    Right arm pain:  Started roughly same time as chest pain, but this has become main issue.  Unilateral swelling and "pain throughout entire arm."  Described as aching and soreness.  On day swelling appearing, noted numbness without weakness of entire arm, no single dermatome.  Denies any trauma or inciting trigger.  States swelling is gradually improving.  Has had some tingling all fingers of Right hand inconsistently but no further numbness.  Pain does not awaken her from sleep.  Denies neck stiffness or pain.    Has taken OTC analgesics occasionally for pain relief of HA, inconsistently improves arm pain.     ROS as above per HPI, otherwise neg.    The following portions of the patient's history were reviewed and updated as appropriate: allergies, current medications, past medical history, family and social history, and problem list. Patient is a nonsmoker.    PMH reviewed.  Past Medical History  Diagnosis Date  . Hypertension   . Anemia   . Migraines   . Obesity   . Arthritis    Past Surgical History  Procedure Laterality Date  . Cervix lesion destruction    . Tubal ligation    . Tubal ligation      Medications reviewed. Current Outpatient Prescriptions  Medication Sig Dispense Refill  . albuterol (PROVENTIL HFA;VENTOLIN HFA) 108 (90 BASE) MCG/ACT inhaler Inhale 1 puff into the lungs every 6 (six) hours as needed for wheezing or shortness of breath.    Marland Kitchen amLODipine (NORVASC) 5 MG tablet Take 5 mg by mouth daily.    . fluticasone (FLONASE) 50 MCG/ACT nasal spray Place 2 sprays into both nostrils daily.    Marland Kitchen  HYDROcodone-acetaminophen (NORCO/VICODIN) 5-325 MG per tablet Take 1 tablet by mouth every 6 (six) hours as needed for moderate pain or severe pain. 10 tablet 0  . ibuprofen (ADVIL,MOTRIN) 600 MG tablet Take 1 tablet (600 mg total) by mouth every 6 (six) hours as needed. 30 tablet 0  . lisinopril (PRINIVIL,ZESTRIL) 20 MG tablet Take 1 tablet (20 mg total) by mouth daily. 30 tablet 0  . SUMAtriptan (IMITREX) 50 MG tablet Take 50 mg by mouth every 2 (two) hours as needed for migraine or headache. May repeat in 2 hours if headache persists or recurs.     No current facility-administered medications for this visit.     Objective:   Physical Exam There were no vitals taken for this visit. Gen:  Alert, cooperative patient who appears stated age in no acute distress.  Vital signs reviewed.  Comfortable appearing. Right arm:  She is holding it somewhat protectively against her Right side.  Does appear to be somewhat more swollen in comparison with Left.  Full active and passive range of motion, not limited at all by pain except at extremes of abduction.  Strength 5/5.  Resistance of bicep/tricep/foream testing limited somewhat by pain.  DTRs +1 brachioradialis BL.  Sensation intact throughout.  Pulses +2 radial and ulnar. Left arm:  Within normal limits  No results found for this or any  previous visit (from the past 72 hour(s)).

## 2014-09-03 NOTE — Telephone Encounter (Signed)
Called and discussed normal [place] with patient.  Appreciative of call.  She'll be heading to sports med next.

## 2014-09-08 ENCOUNTER — Ambulatory Visit (INDEPENDENT_AMBULATORY_CARE_PROVIDER_SITE_OTHER): Payer: BLUE CROSS/BLUE SHIELD | Admitting: Sports Medicine

## 2014-09-08 ENCOUNTER — Encounter: Payer: Self-pay | Admitting: Sports Medicine

## 2014-09-08 VITALS — BP 120/88 | HR 72 | Ht 62.0 in | Wt 210.0 lb

## 2014-09-08 DIAGNOSIS — G540 Brachial plexus disorders: Secondary | ICD-10-CM

## 2014-09-08 MED ORDER — GABAPENTIN 100 MG PO CAPS: ORAL_CAPSULE | ORAL | Status: DC
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
## 2014-09-08 NOTE — Patient Instructions (Signed)
Try vitamin B6 daily New prescription for Gabapentin.  Take as described Try avoid any new traction to the shoulder.

## 2014-09-27 ENCOUNTER — Other Ambulatory Visit: Payer: Self-pay | Admitting: Family Medicine

## 2014-09-29 ENCOUNTER — Ambulatory Visit: Payer: BC Managed Care – PPO | Admitting: Family Medicine

## 2014-10-06 ENCOUNTER — Ambulatory Visit: Payer: BC Managed Care – PPO | Admitting: Sports Medicine

## 2014-11-01 ENCOUNTER — Other Ambulatory Visit: Payer: Self-pay | Admitting: Family Medicine

## 2014-12-31 ENCOUNTER — Other Ambulatory Visit: Payer: Self-pay | Admitting: Family Medicine

## 2015-01-03 NOTE — Telephone Encounter (Signed)
Patient needs to make a follow-up appointment.

## 2015-01-31 ENCOUNTER — Other Ambulatory Visit: Payer: Self-pay | Admitting: Family Medicine

## 2015-01-31 DIAGNOSIS — I10 Essential (primary) hypertension: Secondary | ICD-10-CM

## 2015-02-11 ENCOUNTER — Other Ambulatory Visit (HOSPITAL_COMMUNITY)
Admission: RE | Admit: 2015-02-11 | Discharge: 2015-02-11 | Disposition: A | Discharge status: Home / Self Care | Payer: BLUE CROSS/BLUE SHIELD | Source: Ambulatory Visit | Attending: Family Medicine | Admitting: Family Medicine

## 2015-02-11 ENCOUNTER — Encounter: Payer: Self-pay | Admitting: Family Medicine

## 2015-02-11 ENCOUNTER — Ambulatory Visit (INDEPENDENT_AMBULATORY_CARE_PROVIDER_SITE_OTHER): Payer: BLUE CROSS/BLUE SHIELD | Admitting: Family Medicine

## 2015-02-11 VITALS — BP 120/82 | HR 80 | Temp 98.6°F | Ht 62.0 in | Wt 210.9 lb

## 2015-02-11 DIAGNOSIS — Z0000 Encounter for general adult medical examination without abnormal findings: Secondary | ICD-10-CM | POA: Diagnosis not present

## 2015-02-11 DIAGNOSIS — Z01419 Encounter for gynecological examination (general) (routine) without abnormal findings: Secondary | ICD-10-CM | POA: Diagnosis present

## 2015-02-11 DIAGNOSIS — N939 Abnormal uterine and vaginal bleeding, unspecified: Secondary | ICD-10-CM | POA: Diagnosis not present

## 2015-02-11 DIAGNOSIS — Z113 Encounter for screening for infections with a predominantly sexual mode of transmission: Secondary | ICD-10-CM | POA: Insufficient documentation

## 2015-02-11 DIAGNOSIS — E668 Other obesity: Secondary | ICD-10-CM | POA: Diagnosis not present

## 2015-02-11 DIAGNOSIS — N898 Other specified noninflammatory disorders of vagina: Secondary | ICD-10-CM

## 2015-02-11 DIAGNOSIS — IMO0002 Reserved for concepts with insufficient information to code with codable children: Secondary | ICD-10-CM

## 2015-02-11 DIAGNOSIS — Z202 Contact with and (suspected) exposure to infections with a predominantly sexual mode of transmission: Secondary | ICD-10-CM

## 2015-02-11 DIAGNOSIS — I10 Essential (primary) hypertension: Secondary | ICD-10-CM

## 2015-02-11 DIAGNOSIS — Z1151 Encounter for screening for human papillomavirus (HPV): Secondary | ICD-10-CM | POA: Insufficient documentation

## 2015-02-11 LAB — CBC WITH DIFFERENTIAL/PLATELET
BASOS PCT: 1 % (ref 0–1)
Basophils Absolute: 0.1 10*3/uL (ref 0.0–0.1)
EOS ABS: 0.2 10*3/uL (ref 0.0–0.7)
Eosinophils Relative: 3 % (ref 0–5)
HCT: 38.2 % (ref 36.0–46.0)
HEMOGLOBIN: 12.1 g/dL (ref 12.0–15.0)
Lymphocytes Relative: 51 % — ABNORMAL HIGH (ref 12–46)
Lymphs Abs: 3.3 10*3/uL (ref 0.7–4.0)
MCH: 28.8 pg (ref 26.0–34.0)
MCHC: 31.7 g/dL (ref 30.0–36.0)
MCV: 91 fL (ref 78.0–100.0)
MPV: 9.7 fL (ref 8.6–12.4)
Monocytes Absolute: 0.5 10*3/uL (ref 0.1–1.0)
Monocytes Relative: 8 % (ref 3–12)
NEUTROS ABS: 2.4 10*3/uL (ref 1.7–7.7)
Neutrophils Relative %: 37 % — ABNORMAL LOW (ref 43–77)
Platelets: 278 10*3/uL (ref 150–400)
RBC: 4.2 MIL/uL (ref 3.87–5.11)
RDW: 13.1 % (ref 11.5–15.5)
WBC: 6.5 10*3/uL (ref 4.0–10.5)

## 2015-02-11 LAB — COMPREHENSIVE METABOLIC PANEL
ALBUMIN: 3.9 g/dL (ref 3.5–5.2)
ALT: 30 U/L (ref 0–35)
AST: 20 U/L (ref 0–37)
Alkaline Phosphatase: 46 U/L (ref 39–117)
BUN: 9 mg/dL (ref 6–23)
CALCIUM: 8.7 mg/dL (ref 8.4–10.5)
CHLORIDE: 105 meq/L (ref 96–112)
CO2: 23 meq/L (ref 19–32)
Creat: 0.75 mg/dL (ref 0.50–1.10)
Glucose, Bld: 76 mg/dL (ref 70–99)
Potassium: 3.7 mEq/L (ref 3.5–5.3)
Sodium: 137 mEq/L (ref 135–145)
Total Bilirubin: 0.8 mg/dL (ref 0.2–1.2)
Total Protein: 6.7 g/dL (ref 6.0–8.3)

## 2015-02-11 LAB — TSH: TSH: 0.564 u[IU]/mL (ref 0.350–4.500)

## 2015-02-11 LAB — POCT WET PREP (WET MOUNT): CLUE CELLS WET PREP WHIFF POC: NEGATIVE

## 2015-02-11 LAB — POCT GLYCOSYLATED HEMOGLOBIN (HGB A1C): Hemoglobin A1C: 5.3

## 2015-02-11 LAB — HIV ANTIBODY (ROUTINE TESTING W REFLEX): HIV 1&2 Ab, 4th Generation: NONREACTIVE

## 2015-02-11 MED ORDER — FLUTICASONE PROPIONATE 50 MCG/ACT NA SUSP: 2.0000 | Freq: Every day | NASAL | Status: DC

## 2015-02-11 MED ORDER — SUMATRIPTAN SUCCINATE 50 MG PO TABS: 50.0000 mg | ORAL_TABLET | Freq: Once | ORAL | Status: DC

## 2015-02-11 NOTE — Progress Notes (Signed)
Subjective    Barbara Dunn is a 44 y.o. female that presents for a yearly physical visit.   1. Migraines: This is a chronic issue. She uses Imitrex, which helps. No increase in frequency of migraines. Needs a refill of Imitrex  2. Allergies: Chronic. Flonase works well to control symptoms. Currently needs a refill.  3. Hypertension: Chronic. Adherent with regimen. No side effects.   4. Frequent periods: Was on birth control for control of period but discontinued 6 months ago. History of bilateral tubal ligation. Irregular periods started about two years ago. She is having periods twice per month. Periods are heavy and last about 7 days. She has severe cramping as well. She has never been evaluated for this issue. She has seen an ObGyn named Dr. Raphael Gibney about 15 years ago who performed a pap smear and she was told she had cancerous cells. She reports having her cervix frozen but states no biopsy was performed.  5. Vaginal discharge: clear discharge. Sometimes copious. No odor or itching associated. She currently has sex with males only. Has one female partner.  Past Medical History  Diagnosis Date  . Hypertension   . Anemia   . Migraines   . Obesity   . Arthritis    Past Surgical History  Procedure Laterality Date  . Cervix lesion destruction    . Tubal ligation    . Tubal ligation     Family History  Problem Relation Age of Onset  . Hypertension Mother   . Schizophrenia Mother   . Schizophrenia Sister   . Schizophrenia Brother   . Schizophrenia Brother     History  Substance Use Topics  . Smoking status: Former Smoker    Quit date: 12/15/2003  . Smokeless tobacco: Never Used  . Alcohol Use: No   No Known Allergies  Current Outpatient Prescriptions on File Prior to Visit  Medication Sig Dispense Refill  . albuterol (PROVENTIL HFA;VENTOLIN HFA) 108 (90 BASE) MCG/ACT inhaler Inhale 1 puff into the lungs every 6 (six) hours as needed for wheezing or shortness of  breath.    Marland Kitchen amLODipine (NORVASC) 5 MG tablet Take 5 mg by mouth daily.    Marland Kitchen lisinopril (PRINIVIL,ZESTRIL) 20 MG tablet TAKE 1 TABLET BY MOUTH EVERY DAY 30 tablet 0  . gabapentin (NEURONTIN) 100 MG capsule Take 1 tablet po qhs. After 3 days increase to tid if tolerating well. (Patient not taking: Reported on 02/11/2015) 30 capsule 3  . HYDROcodone-acetaminophen (NORCO/VICODIN) 5-325 MG per tablet Take 1 tablet by mouth every 6 (six) hours as needed for moderate pain or severe pain. (Patient not taking: Reported on 02/11/2015) 10 tablet 0  . ibuprofen (ADVIL,MOTRIN) 600 MG tablet Take 1 tablet (600 mg total) by mouth every 6 (six) hours as needed. (Patient not taking: Reported on 02/11/2015) 30 tablet 0  . MONONESSA 0.25-35 MG-MCG tablet   0   No current facility-administered medications on file prior to visit.     ROS  Per HPI   Objective   BP 120/82 mmHg  Pulse 80  Temp(Src) 98.6 F (37 C) (Oral)  Ht 5\' 2"  (1.575 m)  Wt 210 lb 14.4 oz (95.664 kg)  BMI 38.56 kg/m2  LMP 01/24/2015 (Exact Date)  General: Well appearing, no distress HEENT: Ears normal, eyes anicteric, nares normal, mouth moist and without lesions, neck without lymphadenopathy, no thyromegaly Respiratory/Chest: Clear to auscultation bilaterally, no wheezing Cardiovascular: Regular rate and rhythm, no murmurs Gastrointestinal: Soft, obese, non-tender, non-distended Genitourinary: Vagina  without external or internal lesions, no blood in vagina. Some thin milky discharge noted without odor. Cervix normal in appearance. Pap, wet prep and GC/chlamydia obtained. Musculoskeletal: No deformities Neuro: Grossly intact, reflexes symmetrical  Assessment and Plan   Please refer to problem based charting of assessment and plan

## 2015-02-11 NOTE — Patient Instructions (Addendum)
Thank you for coming to see me today. It was a pleasure. Today we talked about:   High blood pressure: your blood pressure is at goal today.  Allergies: I am refilling your Fluticasone  Migraine: I am refilling your Imitrex  Vaginal bleeding: I am checking a TSH, but I will have you schedule an appointment with our [facility] for further evaluation  Vaginal discharge: I am checking a wet prep to see if there is a treatable infection  Healthcare maintenance: I have obtained a pap smear. You will be contacted with results. You are also due for a tetanus shot.  Please make an appointment to see me in 3 months for follow-up.  If you have any questions or concerns, please do not hesitate to call the office at [phone].  Sincerely,  Cordelia Poche, MD

## 2015-02-12 DIAGNOSIS — N898 Other specified noninflammatory disorders of vagina: Secondary | ICD-10-CM | POA: Insufficient documentation

## 2015-02-12 LAB — RPR

## 2015-02-12 NOTE — Assessment & Plan Note (Signed)
Controlled.  No change in therapy

## 2015-02-12 NOTE — Assessment & Plan Note (Signed)
Possibly normal discharge. No other associated symptoms.  GC/chlamydia  Wet prep

## 2015-02-12 NOTE — Assessment & Plan Note (Signed)
Patient has a long history of intermenstrual bleeding, with obesity as a risk factor. Previously was empirically treated with OCPs. Feel it necessary to at least get ultrasound and possibly endometrial sampling  Pelvic ultrasound  Follow-up with [facility]

## 2015-02-14 ENCOUNTER — Telehealth: Payer: Self-pay | Admitting: Family Medicine

## 2015-02-14 LAB — GC/CHLAMYDIA PROBE AMP (~~LOC~~) NOT AT ARMC
Chlamydia: NEGATIVE
NEISSERIA GONORRHEA: NEGATIVE

## 2015-02-14 NOTE — Telephone Encounter (Signed)
LVM for patient to return my call. Please advise patient that her Ultrasound has been scheduled for this Thursday 02/17/15 at 11:30am (please arrive at 11:15am). Please drink 32oz of water 1 hour prior to appointment.

## 2015-02-15 LAB — CYTOLOGY - PAP

## 2015-02-17 ENCOUNTER — Ambulatory Visit (HOSPITAL_COMMUNITY): Payer: BLUE CROSS/BLUE SHIELD

## 2015-02-17 ENCOUNTER — Encounter: Payer: Self-pay | Admitting: Family Medicine

## 2015-03-01 ENCOUNTER — Other Ambulatory Visit: Payer: Self-pay | Admitting: Family Medicine

## 2015-03-01 DIAGNOSIS — N939 Abnormal uterine and vaginal bleeding, unspecified: Secondary | ICD-10-CM

## 2015-03-02 ENCOUNTER — Ambulatory Visit (HOSPITAL_COMMUNITY)
Admission: RE | Admit: 2015-03-02 | Discharge: 2015-03-02 | Disposition: A | Discharge status: Home / Self Care | Payer: BLUE CROSS/BLUE SHIELD | Source: Ambulatory Visit | Attending: Family Medicine | Admitting: Family Medicine

## 2015-03-02 ENCOUNTER — Other Ambulatory Visit: Payer: Self-pay | Admitting: Family Medicine

## 2015-03-02 DIAGNOSIS — N939 Abnormal uterine and vaginal bleeding, unspecified: Secondary | ICD-10-CM | POA: Diagnosis present

## 2015-03-10 ENCOUNTER — Ambulatory Visit (INDEPENDENT_AMBULATORY_CARE_PROVIDER_SITE_OTHER): Payer: BLUE CROSS/BLUE SHIELD | Admitting: Family Medicine

## 2015-03-10 ENCOUNTER — Encounter: Payer: Self-pay | Admitting: Family Medicine

## 2015-03-10 VITALS — BP 110/82 | HR 88 | Temp 98.2°F | Ht 62.0 in | Wt 207.4 lb

## 2015-03-10 DIAGNOSIS — N939 Abnormal uterine and vaginal bleeding, unspecified: Secondary | ICD-10-CM | POA: Diagnosis not present

## 2015-03-10 NOTE — Patient Instructions (Signed)
I will send you a note about your endometrial biopsy in the mail. If there is any abnormality that needs to discuss, I will call you. If you have any questions at any time please give me a call. My name is Dr. Nori Riis. You may have some spotting and mild cramping the rest of the day. We are giving use 800 mg of ibuprofen. Typically for uterine cramping, I recommend 800 mg every 8 hours with food as needed. This is the maximum dose and he should not take additional Advil, Motrin, Aleve etc. with that but is probably safe to take a total of 2400 mg in a 24-hour period.  You should get a note from me hear from me by phone within the next 7-10 days. Please free to call with any questions or concerns.

## 2015-03-10 NOTE — Progress Notes (Signed)
   Subjective:    Patient ID: Barbara Dunn, female    DOB: November 25, 1970, 44 y.o.   MRN: [id number]  HPI 44 yo female long hx of irregular menses and intermenstrual bleeding. Recent pap normal, no hx of abnormal paps, no hx of cervical procedures.  Recent XHB:ZJIRCV  Measurements: 11.5 x 5.6 x 5.0 cm. No fibroids or other mass visualized. Probable 9 mm nabothian cyst. Endometrium: Thickness: 21 mm. No focal abnormality visualized. Right ovary:  Measurements: 4.2 x 2.8 x 2.6 cm. Normal appearance/no adnexal mass. Left ovary:  Measurements: 2.2 x 1.6 x 1.5 cm. Normal appearance/no adnexal mass.     Review of Systems No unusual pelvic pain. No fever. No abdominal pain.    Objective:   Physical Exam  WD WN NAD GU externally normal. PROCEDURE NOTE: Endometrial Biopsy Patient given informed consent, signed copy in the chart.  Appropriate time out taken. . The patient was placed in the lithotomy position and the cervix brought into view with sterile speculum.  The portio of cervix was cleansed x 3 with betadine swabs.  A tenaculum was placed in the anterior lip of the cervix.  A uterine sound was used to measure the uterus.. A pipelle was introduced  into the uterus, suction created,  and an endometrial sample was obtained. All equipment was removed and accounted for.   The patient tolerated the procedure well.  Minimal spotting type bleeding.  Patient given post procedure instructions. I will notify her of any pathology results.       Assessment & Plan:

## 2015-03-10 NOTE — Addendum Note (Signed)
Addended by: [place], Arijana Narayan on: 03/10/2015 04:06 PM   Modules accepted: Orders

## 2015-03-10 NOTE — Addendum Note (Signed)
Addended byDorcas Mcmurray L on: 03/10/2015 11:51 AM   Modules accepted: Orders

## 2015-03-22 ENCOUNTER — Telehealth: Payer: Self-pay | Admitting: Family Medicine

## 2015-03-22 ENCOUNTER — Encounter: Payer: Self-pay | Admitting: Family Medicine

## 2015-03-22 NOTE — Telephone Encounter (Signed)
Pt informed. Barbara Dunn Dawn  

## 2015-03-22 NOTE — Telephone Encounter (Signed)
Dear [name] Team Please let her know the endometrial biospy was NORMAL. I am sending her a letter [facility]! Barbara Dunn

## 2015-03-28 ENCOUNTER — Other Ambulatory Visit: Payer: Self-pay | Admitting: Family Medicine

## 2015-04-01 MED ORDER — AMLODIPINE BESYLATE 5 MG PO TABS: 5.0000 mg | ORAL_TABLET | Freq: Every day | ORAL | Status: DC

## 2015-04-20 ENCOUNTER — Other Ambulatory Visit: Payer: Self-pay | Admitting: *Deleted

## 2015-04-20 MED ORDER — AMLODIPINE BESYLATE 5 MG PO TABS: 5.0000 mg | ORAL_TABLET | Freq: Every day | ORAL | Status: DC

## 2015-05-04 ENCOUNTER — Other Ambulatory Visit: Payer: Self-pay | Admitting: Family Medicine

## 2015-05-04 ENCOUNTER — Ambulatory Visit: Payer: Worker's Compensation

## 2015-05-04 ENCOUNTER — Ambulatory Visit (INDEPENDENT_AMBULATORY_CARE_PROVIDER_SITE_OTHER): Payer: Worker's Compensation | Admitting: Urgent Care

## 2015-05-04 VITALS — BP 120/80 | HR 67 | Temp 98.9°F | Resp 16 | Ht 62.0 in | Wt 215.0 lb

## 2015-05-04 DIAGNOSIS — W19XXXA Unspecified fall, initial encounter: Secondary | ICD-10-CM

## 2015-05-04 DIAGNOSIS — S8002XA Contusion of left knee, initial encounter: Secondary | ICD-10-CM | POA: Diagnosis not present

## 2015-05-04 DIAGNOSIS — S8000XA Contusion of unspecified knee, initial encounter: Secondary | ICD-10-CM | POA: Insufficient documentation

## 2015-05-04 DIAGNOSIS — M25562 Pain in left knee: Secondary | ICD-10-CM

## 2015-05-04 MED ORDER — MELOXICAM 15 MG PO TABS: 7.5000 mg | ORAL_TABLET | Freq: Every day | ORAL | Status: DC
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
## 2015-05-04 NOTE — Patient Instructions (Signed)

## 2015-05-11 ENCOUNTER — Ambulatory Visit (INDEPENDENT_AMBULATORY_CARE_PROVIDER_SITE_OTHER): Payer: Worker's Compensation | Admitting: Family Medicine

## 2015-05-11 VITALS — BP 122/78 | HR 76 | Temp 98.1°F | Resp 16 | Ht 62.0 in | Wt 210.6 lb

## 2015-05-11 DIAGNOSIS — S86912D Strain of unspecified muscle(s) and tendon(s) at lower leg level, left leg, subsequent encounter: Secondary | ICD-10-CM

## 2015-05-11 DIAGNOSIS — S86812D Strain of other muscle(s) and tendon(s) at lower leg level, left leg, subsequent encounter: Secondary | ICD-10-CM | POA: Diagnosis not present
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
## 2015-05-18 ENCOUNTER — Ambulatory Visit (INDEPENDENT_AMBULATORY_CARE_PROVIDER_SITE_OTHER): Payer: Worker's Compensation | Admitting: Family Medicine

## 2015-05-18 VITALS — BP 100/70 | HR 63 | Temp 98.3°F | Resp 16 | Ht 62.0 in | Wt 210.4 lb

## 2015-05-18 DIAGNOSIS — M25562 Pain in left knee: Secondary | ICD-10-CM

## 2015-05-18 DIAGNOSIS — S8392XD Sprain of unspecified site of left knee, subsequent encounter: Secondary | ICD-10-CM | POA: Diagnosis not present

## 2015-05-18 NOTE — Patient Instructions (Signed)
Continue to use your brace. mobic as needed.  Avoid excessive lifting or anything else that hurts your knee Come back and see [place] in 2 weeks for a recheck

## 2015-05-18 NOTE — Progress Notes (Signed)
Barbara Dunn 13-Mar-1971 44 y.o.   Chief Complaint  Patient presents with  . Follow-up    workers comp injury     Date of Injury: 05/03/2015  History of Present Illness:  Presents for evaluation of work-related complaint. originally seen here on 7/13 after a fall that occurred while at work- she was trying to catch a pt and fell onto her left knee.   She was treated with mobic and a knee brace.  Recheck a week later on 7/20- improved, RTW on limited duty She states that she is "a lot better," but she is concerned about lifting a lot of weight which may be required at times.   She is wearing her knee brace which does seem to help with stability and pain control.  Wearing the brace feels good to her She is not taking meloxicam unless needed- last took a couple of days ago   ROS  Otherwise feeling well  No Known Allergies   Current medications reviewed and updated. Past medical history, family history, social history have been reviewed and updated.   Physical Exam  BP 100/70 mmHg  Pulse 63  Temp(Src) 98.3 F (36.8 C) (Oral)  Resp 16  Ht 5\' 2"  (1.575 m)  Wt 210 lb 6.4 oz (95.437 kg)  BMI 38.47 kg/m2  SpO2 99%  LMP 05/09/2015 (Exact Date)   GEN: WDWN, NAD, Non-toxic, Alert & Oriented x 3,  Looks well, obese HEENT: Atraumatic, Normocephalic.  Ears and Nose: No external deformity. EXTR: No clubbing/cyanosis/edema NEURO: Normal gait.  PSYCH: Normally interactive. Conversant. Not depressed or anxious appearing.  Calm demeanor.  Left knee: she is wearing her knee brace.  Mild lateral joint line pain.  No effusion, no redness, no heat.  Knee is stable.  Normal ROM of her joint    Assessment and Plan: Knee sprain, left, subsequent encounter  Left knee pain continue knee brace and mobic as needed Continue restrictions for work RTC in 2 weeks for a recheck

## 2015-05-27 ENCOUNTER — Ambulatory Visit (INDEPENDENT_AMBULATORY_CARE_PROVIDER_SITE_OTHER): Payer: Self-pay | Admitting: Family Medicine

## 2015-05-27 VITALS — BP 111/75 | HR 73 | Temp 98.5°F | Ht 63.0 in | Wt 210.4 lb

## 2015-05-27 DIAGNOSIS — R42 Dizziness and giddiness: Secondary | ICD-10-CM

## 2015-05-27 DIAGNOSIS — R079 Chest pain, unspecified: Secondary | ICD-10-CM

## 2015-05-27 MED ORDER — MECLIZINE HCL 25 MG PO TABS: 25.0000 mg | ORAL_TABLET | Freq: Three times a day (TID) | ORAL | Status: DC | PRN

## 2015-05-27 NOTE — Progress Notes (Signed)
Subjective:    Patient ID: Barbara Dunn , female   DOB: [birth year] , 44 y.o..   MRN: [id number]  HPI  Barbara Dunn is here for lightheadedness and chest pain.   Dizziness/Lightheadedness Patient admits to experiencing dizziness and sharp intermittent chest pain for the past week. She states that she feels like the room is spinning. She vomited twice on Tuesday due to feeling dizzy. Patient drinks approximately 1.5 bottles of water daily. She sleeps 4-5 hrs/night.  Admits to nausea and constipation, fatigue, headache. Denies loss of consciousness or change in vision.  Chest Pain Patient admits to experiencing dizziness and sharp intermittent chest pain for the past week. She states the chest pain is worse when she lays on her back. The pain is mostly in the Dunn of her chest. No alleviating factors have been identified. She is not taking any medications for chest pain. Admits to having chest pain in Oct of 2015, she went to the ED and was told it was most likely musculoskeletal based on normal EKG and enzyme levels. Admits to fatigue, headaches, nausea, constipation. Denies shortness of breath, pain in arm or face, numbness, tingling, GERD.     Health Maintenance Due  Topic Date Due  . TETANUS/TDAP  11/22/2013  . INFLUENZA VACCINE  05/23/2015    -  reports that she quit smoking about 11 years ago. She has never used smokeless tobacco. - Review of Systems: Per HPI. All other systems reviewed and are negative. - Past Medical History: Patient Active Problem List   Diagnosis Date Noted  . Knee contusion 05/04/2015  . Vaginal discharge 02/12/2015  . Abnormal uterine bleeding 02/11/2015  . 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 Shortness of breath 04/20/2014  . Knee pain 04/20/2014  . 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 Candidal intertrigo 11/09/2013  . Health care maintenance 11/09/2013  . Hair loss 09/15/2013  . Pain in right shoulder 08/18/2013  . 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 Hypertension 07/13/2012  . Leg pain 03/27/2012  . MIGRAINE, COMMON W/O INTRACTABLE MIGRAINE 01/17/2007  . OBESITY, NOS 12/19/2006   - Medications: reviewed and updated Current Outpatient Prescriptions  Medication Sig Dispense Refill  . albuterol (PROVENTIL HFA;VENTOLIN HFA) 108 (90 BASE) MCG/ACT inhaler Inhale 1 puff into the lungs every 6 (six) hours as needed for wheezing or shortness of breath.    Marland Kitchen amLODipine (NORVASC) 5 MG tablet Take 1 tablet (5 mg total) by mouth daily. 30 tablet 5  . BIOTIN PO Take by mouth.    . fluticasone (FLONASE) 50 MCG/ACT nasal spray Place 2 sprays into both nostrils daily. 16 g 11  . lisinopril (PRINIVIL,ZESTRIL) 20 MG tablet TAKE 1 TABLET BY MOUTH DAILY 30 tablet 8  . meloxicam (MOBIC) 15 MG tablet Take 0.5-1 tablets (7.5-15 mg total) by mouth daily. 30 tablet 0  . MONONESSA 0.25-35 MG-MCG tablet   0  . SUMAtriptan (IMITREX) 50 MG tablet Take 1 tablet (50 mg total) by mouth once. Repeat in 2 hours if headache persists/recurs. 10 tablet 0  . topiramate (TOPAMAX) 50 MG tablet Take 50 mg by mouth 2 (two) times daily.    Marland Kitchen gabapentin (NEURONTIN) 100 MG capsule Take 1 tablet po qhs. After 3 days increase to tid if tolerating well. (Patient not taking: Reported on 05/27/2015) 30 capsule 3  . MONONESSA 0.25-35 MG-MCG tablet TAKE 1 TABLET BY MOUTH DAILY AS DIRECTED (Patient not taking: Reported on 05/27/2015) 28 tablet 0   No current facility-administered medications for this  visit.    SHx: No tobacco use, alcohol, or illicit drugs.  Review of Systems See HPI     Objective:   Physical Exam BP 111/75 mmHg  Pulse 73  Temp(Src) 98.5 F (36.9 C) (Oral)  Ht 5\' 3"  (1.6 m)  Wt 210 lb 6.4 oz (95.437 kg)  BMI 37.28 kg/m2  LMP 05/09/2015 (Exact Date) Gen: NAD, alert, cooperative with exam, well-appearing HEENT: NCAT, PERRL, clear conjunctiva, oropharynx clear, supple neck CV: RRR, good S1/S2, no murmur, no edema, capillary refill brisk  Resp: CTABL, no  wheezes, non-labored Chest: No lesions seen, no pain upon palpation Abd: SNTND, BS present, no guarding or organomegaly Skin: no rashes, normal turgor. Hyperpigmented skin tags seen on bilateral cheeks of face Neuro: no gross deficits. AO x3. Antalgic gait due to L knee pain. Cranial nerves II-XII intact, strength 5/5 in bilateral UE and LE. +2 tibial reflexes. sensation intact throughout. Negative Romberg Psych: good insight, alert and oriented     Assessment & Plan:

## 2015-05-27 NOTE — Patient Instructions (Addendum)
Thank you for coming in today, it was so nice to meet you! Today we discussed your dizziness and chest pain. For your dizziness you will begin a new medication called Meclizine. If this medication does not help, please come back to the clinic. For your chest pain, you will be referred to a cardiologist. If you begin to feel short of breath, sweaty, and your pain becomes worse, please go to the emergency department.   Vertigo Vertigo means you feel like you are moving when you are not. Vertigo can make you feel like things around you are moving when they are not. This problem often goes away on its own.  HOME CARE   Follow your doctor's instructions.  Avoid driving.  Avoid using heavy machinery.  Avoid doing any activity that could be dangerous if you have a vertigo attack.  Tell your doctor if a medicine seems to cause your vertigo. GET HELP RIGHT AWAY IF:   Your medicines do not help or make you feel worse.  You have trouble talking or walking.  You feel weak or have trouble using your arms, hands, or legs.  You have bad headaches.  You keep feeling sick to your stomach (nauseous) or throwing up (vomiting).  Your vision changes.  A family member notices changes in your behavior.  Your problems get worse. MAKE SURE YOU:  Understand these instructions.  Will watch your condition.  Will get help right away if you are not doing well or get worse. Document Released: 07/17/2008 Document Revised: 12/31/2011 Document Reviewed: 04/26/2011 [facility] Patient Information 2015 [place], [place]. This information is not intended to replace advice given to you by your health care provider. Make sure you discuss any questions you have with your health care provider.

## 2015-05-30 ENCOUNTER — Encounter: Payer: Self-pay | Admitting: Family Medicine

## 2015-05-30 DIAGNOSIS — R42 Dizziness and giddiness: Secondary | ICD-10-CM | POA: Insufficient documentation

## 2015-05-30 DIAGNOSIS — R079 Chest pain, unspecified: Secondary | ICD-10-CM | POA: Insufficient documentation

## 2015-05-30 NOTE — Assessment & Plan Note (Signed)
Presents as atypical chest pain. Will refer to cardiology, may need a stress test.

## 2015-05-30 NOTE — Assessment & Plan Note (Signed)
Will start trial of Meclizine 25mg  PRN

## 2015-06-01 ENCOUNTER — Ambulatory Visit (INDEPENDENT_AMBULATORY_CARE_PROVIDER_SITE_OTHER): Admitting: Family Medicine

## 2015-06-01 VITALS — BP 120/80 | HR 79 | Temp 98.4°F | Resp 16 | Ht 63.0 in | Wt 211.0 lb

## 2015-06-01 DIAGNOSIS — M25562 Pain in left knee: Secondary | ICD-10-CM | POA: Diagnosis not present

## 2015-06-01 DIAGNOSIS — S8392XD Sprain of unspecified site of left knee, subsequent encounter: Secondary | ICD-10-CM

## 2015-06-01 MED ORDER — MELOXICAM 15 MG PO TABS: 7.5000 mg | ORAL_TABLET | Freq: Every day | ORAL | Status: DC

## 2015-06-01 NOTE — Patient Instructions (Addendum)
We will refer you to orthopedics to look at your knee- they will take over your care, so you do not need to come back and see [place] again.  However if you do need [place] we are happy to see you Continue to ice your knee, and wear your knee immobilizer or hinged brace as needed for comfort  Continue to take mobic as needed for your knee pain

## 2015-06-01 NOTE — Progress Notes (Signed)
Barbara Dunn 07-21-1971 44 y.o.   Chief Complaint  Patient presents with  . Follow-up    workers comp., left knee pain     Date of Injury: 05/03/2015  History of Present Illness:  Presents for evaluation of work-related complaint.  Occurred on 7/13 when a pt started to have a seizure and she had to catch them, landing on her left knee.   Last seen here on 7/27 for recheck of left knee contusion, at which time she appeared to be getting better.   However today she states that she is having a lot of pain, cannot really describe if/ when it got worse but states that she has persistently been in pain.  She states that "I always had the pain in it."   She has stopped wearing her hinged knee brace as it seems to make her knee feel worse.   She has been at work on limited duty.  She is not ready to do full duty.  She also does another job on the weekends that is even more strenuous and cannot return to that job either  She had negative x-rays of her left knee on 7/13 ROS  Otherwise well today  No Known Allergies   Current medications reviewed and updated. Past medical history, family history, social history have been reviewed and updated.   Physical Exam  GEN: WDWN, NAD, Non-toxic, A & O x 3, obese, looks well HEENT: Atraumatic, Normocephalic. Neck supple. No masses, No LAD. Ears and Nose: No external deformity. CV: RRR, No M/G/R. No JVD. No thrill. No extra heart sounds. PULM: CTA B, no wheezes, crackles, rhonchi. No retractions. No resp. distress. No accessory muscle use. EXTR: No c/c/e NEURO favoring left slightly PSYCH: Normally interactive. Conversant. Not depressed or anxious appearing.  Calm demeanor.  Left leg: small joint effusion is now apparent.  She indicates tenderness at the medial joint line.  Knee is stable with normal ROM, no heat or crepitus.  No calf swelling or tenderness  Assessment and Plan: Left knee sprain, subsequent encounter - Plan: Ambulatory referral  to Orthopedic Surgery  Left knee pain - Plan: meloxicam (MOBIC) 15 MG tablet  Persistent, ? Worsening knee pain Will refer to ortho at this time for their opinion Refilled her mobic Placed in a knee immobilizer that did feel good to her Follow-up with [place] if needed

## 2015-06-08 ENCOUNTER — Telehealth: Payer: Self-pay

## 2015-06-08 NOTE — Telephone Encounter (Signed)
Patient came to office at 12:35pm to pick up her x-rays. Patient did not receive call from our office that her message had processed and that her x-rays were ready, she just showed up wanting to get them. Alerted an x-ray tech and she make a copy of patient's x-rays on disk. Patient completed her ROI form as well.

## 2015-06-08 NOTE — Telephone Encounter (Signed)
Requesting copy of x-ray - has an appointment with ORTHO.   Will complete form when she comes in.    [phone]

## 2015-06-09 ENCOUNTER — Other Ambulatory Visit: Payer: Self-pay | Admitting: Family Medicine

## 2015-06-09 NOTE — Telephone Encounter (Signed)
Need refill on her Barbara Dunn [place] sent to [facility] on [address]

## 2015-06-13 MED ORDER — NORGESTIMATE-ETH ESTRADIOL 0.25-35 MG-MCG PO TABS: 1.0000 | ORAL_TABLET | Freq: Every day | ORAL | Status: DC

## 2015-07-12 NOTE — Progress Notes (Signed)
Cardiology Office Note   Date:  07/13/2015   ID:  Barbara Dunn, DOB [birth year], MRN [id number]  PCP:  Cordelia Poche, MD  Cardiologist:   Sharol Harness, MD   Chief Complaint  Patient presents with  . Chest Pain  . Dizziness  . Shortness of Breath    EVERY ONCE IN A WHILE  . Edema    LEFT FOOT DUE TO KNEE INJURY  . New Evaluation      History of Present Illness: Barbara Dunn is a 44 y.o. female with hypertension and obesity who presents for an evaluation of chest pain.  Barbara Dunn was evaluated by her PCP on 05/30/15 and reported atypical chest pain symptoms.  She was referred to cardiology for further evaluation.  Barbara Dunn reports chest pain that's been intermittent over the last year. It last for 2 hours but she did have one episode that lasted all day. She presented to the ED Last October and had 2 sets of negative cardiac troponins and a negative d-dimer. The pain improved with Toradol and was thought to be musculoskeletal.   The pain is substernal and under her left breast. It does not radiate. There is no associated shortness of breath, nausea, vomiting, lightheadedness or dizziness. She also denies diaphoresis. It is not particularly tied to exertion. She is unsure if it gets worse when she walks because she typically sits still when it occurs.  It is sharp and 4-8 out of 10 in severity.  Barbara Dunn reports significant weight gain since she stopped smoking cigarettes 10 years ago. She does not get any formal exercise because she is tired all the time. She also hurt her left knee which limits her ability to walk. She states that her diet is not good and that she eats all the time.    Of note Barbara Dunn's sister had a heart attack at age 68.   Past Medical History  Diagnosis Date  . Hypertension   . Anemia   . Migraines   . Obesity   . Arthritis     Past Surgical History  Procedure Laterality Date  . Cervix lesion destruction    . Tubal ligation    . Tubal ligation        Current Outpatient Prescriptions  Medication Sig Dispense Refill  . albuterol (PROVENTIL HFA;VENTOLIN HFA) 108 (90 BASE) MCG/ACT inhaler Inhale 1 puff into the lungs every 6 (six) hours as needed for wheezing or shortness of breath.    Marland Kitchen amLODipine (NORVASC) 5 MG tablet Take 1 tablet (5 mg total) by mouth daily. 30 tablet 5  . BIOTIN PO Take by mouth 2 (two) times daily.     . cetirizine (ZYRTEC) 10 MG tablet Take 10 mg by mouth daily.    . fluticasone (FLONASE) 50 MCG/ACT nasal spray Place 2 sprays into both nostrils daily. 16 g 11  . lisinopril (PRINIVIL,ZESTRIL) 20 MG tablet TAKE 1 TABLET BY MOUTH DAILY 30 tablet 8  . meclizine (ANTIVERT) 25 MG tablet Take 1 tablet (25 mg total) by mouth 3 (three) times daily as needed. 30 tablet 3  . Multiple Vitamins-Minerals (MULTIVITAMIN ADULT PO) Take by mouth daily.    . Pyridoxine HCl (VITAMIN B6 PO) Take by mouth daily.    Marland Kitchen topiramate (TOPAMAX) 50 MG tablet Take 50 mg by mouth 2 (two) times daily.     No current facility-administered medications for this visit.    Allergies:   Review of patient's allergies indicates no known  allergies.    Social History:  The patient  reports that she quit smoking about 11 years ago. She has never used smokeless tobacco. She reports that she drinks about 0.6 oz of alcohol per week. She reports that she does not use illicit drugs.   Family History:  The patient's family history includes Hypertension in her mother; Schizophrenia in her brother, brother, mother, and sister.   MI in sister at age 68.   ROS:  Please see the history of present illness.   Otherwise, review of systems are positive for none.   All other systems are reviewed and negative.    PHYSICAL EXAM: VS:  BP 128/80 mmHg  Pulse 70  Ht 5\' 2"  (1.575 m)  Wt 95.255 kg (210 lb)  BMI 38.40 kg/m2 , BMI Body mass index is 38.4 kg/(m^2). GENERAL:  Well appearing HEENT:  Pupils equal round and reactive, fundi not visualized, oral mucosa  unremarkable NECK:  No jugular venous distention, waveform within normal limits, carotid upstroke brisk and symmetric, no bruits, no thyromegaly LYMPHATICS:  No cervical adenopathy LUNGS:  Clear to auscultation bilaterally HEART:  RRR.  PMI not displaced or sustained,S1 and S2 within normal limits, no S3, no S4, no clicks, no rubs, no murmurs ABD:  Flat, positive bowel sounds normal in frequency in pitch, no bruits, no rebound, no guarding, no midline pulsatile mass, no hepatomegaly, no splenomegaly EXT:  2 plus pulses throughout, no edema, no cyanosis no clubbing SKIN:  No rashes no nodules NEURO:  Cranial nerves II through XII grossly intact, motor grossly intact throughout PSYCH:  Cognitively intact, oriented to person place and time    EKG:  EKG is ordered today. The ekg ordered today demonstrates sinus rhyhtm at 70 bpm.  Non-specific T wave flattening.   Recent Labs: 02/11/2015: ALT 30; BUN 9; Creat 0.75; Hemoglobin 12.1; Platelets 278; Potassium 3.7; Sodium 137; TSH 0.564    Lipid Panel    Component Value Date/Time   CHOL 165 11/09/2013 1703   TRIG 64 11/09/2013 1703   HDL 91 11/09/2013 1703   CHOLHDL 1.8 11/09/2013 1703   VLDL 13 11/09/2013 1703   LDLCALC 61 11/09/2013 1703      Wt Readings from Last 3 Encounters:  07/13/15 95.255 kg (210 lb)  06/01/15 95.709 kg (211 lb)  05/27/15 95.437 kg (210 lb 6.4 oz)      Other studies Reviewed: Additional studies/ records that were reviewed today include: medical record. Review of the above records demonstrates:  Please see elsewhere in the note.     ASSESSMENT AND PLAN:  # Atypical chest pain:Symptoms are quite atypical.  However she does have several risk factors including hypertension and prior tobacco abuse. Also her sister had a heart attack at age 11. Therefore we will proceed with pharmacologic nuclear stress testing. She has a left knee injury that prohibits her from walking on a treadmill.   # Hypertension: Blood  pressure well controlled. Continue amlodipine and lisinopril.  # Obesity:Barbara Dunn was advised to increase her exercise to 30-40 minutes most days of the week. It was stressed that she does not have to do all that at one setting. He also recommended that she substitute some of her unhealthy snacks for fruits and vegetables. She expressed understanding.    Current medicines are reviewed at length with the patient today.  The patient does not have concerns regarding medicines.  The following changes have been made:  no change  Labs/ tests ordered today include:  Orders Placed This Encounter  Procedures  . Myocardial Perfusion Imaging  . EKG 12-Lead     Disposition:   FU with Tiffany C. Oval Linsey, MD prn.   Signed, Sharol Harness, MD  07/13/2015 12:30 PM    [facility]

## 2015-07-13 ENCOUNTER — Ambulatory Visit (INDEPENDENT_AMBULATORY_CARE_PROVIDER_SITE_OTHER): Payer: Self-pay | Admitting: Cardiovascular Disease

## 2015-07-13 ENCOUNTER — Encounter: Payer: Self-pay | Admitting: Cardiovascular Disease

## 2015-07-13 VITALS — BP 128/80 | HR 70 | Ht 62.0 in | Wt 210.0 lb

## 2015-07-13 DIAGNOSIS — R0602 Shortness of breath: Secondary | ICD-10-CM

## 2015-07-13 DIAGNOSIS — I10 Essential (primary) hypertension: Secondary | ICD-10-CM

## 2015-07-13 DIAGNOSIS — R072 Precordial pain: Secondary | ICD-10-CM

## 2015-07-13 DIAGNOSIS — R42 Dizziness and giddiness: Secondary | ICD-10-CM

## 2015-07-13 DIAGNOSIS — R079 Chest pain, unspecified: Secondary | ICD-10-CM

## 2015-07-13 NOTE — Patient Instructions (Signed)
Your physician has requested that you have a lexiscan myoview. For further information please visit [email or web address]. Please follow instruction sheet, as given.   No other changes   Will contact you with results  Your physician recommends that you schedule a follow-up appointment as needed.

## 2015-07-20 ENCOUNTER — Telehealth (HOSPITAL_COMMUNITY): Payer: Self-pay

## 2015-07-20 NOTE — Telephone Encounter (Signed)
Encounter complete. 

## 2015-07-22 ENCOUNTER — Ambulatory Visit (HOSPITAL_COMMUNITY)
Admission: RE | Admit: 2015-07-22 | Discharge: 2015-07-22 | Disposition: A | Discharge status: Home / Self Care | Payer: Self-pay | Source: Ambulatory Visit | Attending: Cardiology | Admitting: Cardiology

## 2015-07-22 DIAGNOSIS — E669 Obesity, unspecified: Secondary | ICD-10-CM | POA: Insufficient documentation

## 2015-07-22 DIAGNOSIS — R0602 Shortness of breath: Secondary | ICD-10-CM | POA: Insufficient documentation

## 2015-07-22 DIAGNOSIS — R072 Precordial pain: Secondary | ICD-10-CM | POA: Insufficient documentation

## 2015-07-22 DIAGNOSIS — Z8249 Family history of ischemic heart disease and other diseases of the circulatory system: Secondary | ICD-10-CM | POA: Insufficient documentation

## 2015-07-22 DIAGNOSIS — I10 Essential (primary) hypertension: Secondary | ICD-10-CM | POA: Insufficient documentation

## 2015-07-22 DIAGNOSIS — Z6838 Body mass index (BMI) 38.0-38.9, adult: Secondary | ICD-10-CM | POA: Insufficient documentation

## 2015-07-22 DIAGNOSIS — F17200 Nicotine dependence, unspecified, uncomplicated: Secondary | ICD-10-CM | POA: Insufficient documentation

## 2015-07-22 DIAGNOSIS — R9439 Abnormal result of other cardiovascular function study: Secondary | ICD-10-CM | POA: Insufficient documentation

## 2015-07-22 DIAGNOSIS — R42 Dizziness and giddiness: Secondary | ICD-10-CM | POA: Insufficient documentation

## 2015-07-22 LAB — MYOCARDIAL PERFUSION IMAGING
CHL CUP NUCLEAR SDS: 3
CHL CUP NUCLEAR SRS: 0
CHL CUP RESTING HR STRESS: 70 {beats}/min
LV sys vol: 29 mL
LVDIAVOL: 100 mL
Peak HR: 104 {beats}/min
SSS: 3
TID: 1

## 2015-07-22 MED ORDER — TECHNETIUM TC 99M SESTAMIBI GENERIC - CARDIOLITE
31.9000 | Freq: Once | INTRAVENOUS | Status: AC | PRN
  Administered 2015-07-22: 31.9 via INTRAVENOUS

## 2015-07-22 MED ORDER — REGADENOSON 0.4 MG/5ML IV SOLN
0.4000 mg | Freq: Once | INTRAVENOUS | Status: AC
  Administered 2015-07-22: 0.4 mg via INTRAVENOUS

## 2015-07-22 MED ORDER — TECHNETIUM TC 99M SESTAMIBI GENERIC - CARDIOLITE
10.7000 | Freq: Once | INTRAVENOUS | Status: AC | PRN
  Administered 2015-07-22: 10.7 via INTRAVENOUS

## 2015-07-26 ENCOUNTER — Telehealth: Payer: Self-pay | Admitting: *Deleted

## 2015-07-26 DIAGNOSIS — Z01818 Encounter for other preprocedural examination: Secondary | ICD-10-CM

## 2015-07-26 DIAGNOSIS — R9439 Abnormal result of other cardiovascular function study: Secondary | ICD-10-CM

## 2015-07-26 DIAGNOSIS — D689 Coagulation defect, unspecified: Secondary | ICD-10-CM

## 2015-07-26 NOTE — Telephone Encounter (Signed)
Left message to call back  

## 2015-07-26 NOTE — Telephone Encounter (Signed)
-----   Message from Skeet Latch, MD sent at 07/24/2015  6:43 PM EDT ----- Stress test was likely normal.  There was a very small area that could represent an area that is not getting enough blood flow.  Most likely it is just a normal finding.  The only way to tell for sure is a heart catheterization.  If she wants to schedule a heart cath that is reasonable.  Otherwise, we can plan for 3 month follow up to assess her symptoms.

## 2015-07-27 ENCOUNTER — Other Ambulatory Visit: Payer: Self-pay | Admitting: *Deleted

## 2015-07-27 ENCOUNTER — Encounter: Payer: Self-pay | Admitting: Cardiovascular Disease

## 2015-07-27 NOTE — Telephone Encounter (Signed)
Spoke to patient. Result given . Verbalized understanding Patient sates she has not been resting the last 2 nights with some chest discomfort. She would like to proceed with heart catheterization. RN instruction given and patient aware will need labs prior to procedure. Scheduler will contact patient with further instruction

## 2015-07-27 NOTE — Telephone Encounter (Signed)
Returning your call from yesterday. °

## 2015-07-28 LAB — CBC
HCT: 36.3 % (ref 36.0–46.0)
Hemoglobin: 12.1 g/dL (ref 12.0–15.0)
MCH: 29.4 pg (ref 26.0–34.0)
MCHC: 33.3 g/dL (ref 30.0–36.0)
MCV: 88.1 fL (ref 78.0–100.0)
MPV: 9.8 fL (ref 8.6–12.4)
PLATELETS: 258 10*3/uL (ref 150–400)
RBC: 4.12 MIL/uL (ref 3.87–5.11)
RDW: 12.6 % (ref 11.5–15.5)
WBC: 5.7 10*3/uL (ref 4.0–10.5)

## 2015-07-28 LAB — BASIC METABOLIC PANEL
BUN: 9 mg/dL (ref 7–25)
CALCIUM: 8.8 mg/dL (ref 8.6–10.2)
CO2: 28 mmol/L (ref 20–31)
CREATININE: 0.74 mg/dL (ref 0.50–1.10)
Chloride: 105 mmol/L (ref 98–110)
Glucose, Bld: 85 mg/dL (ref 65–99)
Potassium: 3.8 mmol/L (ref 3.5–5.3)
SODIUM: 139 mmol/L (ref 135–146)

## 2015-07-28 LAB — PROTIME-INR
INR: 1 (ref <1.50)
Prothrombin Time: 13.3 seconds (ref 11.6–15.2)

## 2015-07-28 LAB — APTT: aPTT: 32 seconds (ref 24–37)

## 2015-07-29 ENCOUNTER — Other Ambulatory Visit: Payer: Self-pay | Admitting: *Deleted

## 2015-07-29 DIAGNOSIS — Z01818 Encounter for other preprocedural examination: Secondary | ICD-10-CM

## 2015-08-01 ENCOUNTER — Telehealth: Payer: Self-pay | Admitting: *Deleted

## 2015-08-01 NOTE — Telephone Encounter (Signed)
Let message results are good.

## 2015-08-01 NOTE — Telephone Encounter (Signed)
-----   Message from Skeet Latch, MD sent at 07/31/2015 11:13 AM EDT ----- Normal labs.

## 2015-08-02 ENCOUNTER — Encounter (HOSPITAL_COMMUNITY)
Admission: RE | Disposition: A | Discharge status: Home / Self Care | Payer: Self-pay | Source: Ambulatory Visit | Attending: Cardiovascular Disease

## 2015-08-02 ENCOUNTER — Encounter (HOSPITAL_COMMUNITY): Payer: Self-pay | Admitting: Cardiovascular Disease

## 2015-08-02 ENCOUNTER — Ambulatory Visit (HOSPITAL_COMMUNITY)
Admission: RE | Admit: 2015-08-02 | Discharge: 2015-08-02 | Disposition: A | Discharge status: Home / Self Care | Payer: Self-pay | Source: Ambulatory Visit | Attending: Cardiovascular Disease | Admitting: Cardiovascular Disease

## 2015-08-02 DIAGNOSIS — Z6838 Body mass index (BMI) 38.0-38.9, adult: Secondary | ICD-10-CM | POA: Insufficient documentation

## 2015-08-02 DIAGNOSIS — Z01818 Encounter for other preprocedural examination: Secondary | ICD-10-CM

## 2015-08-02 DIAGNOSIS — Z87891 Personal history of nicotine dependence: Secondary | ICD-10-CM | POA: Insufficient documentation

## 2015-08-02 DIAGNOSIS — M1990 Unspecified osteoarthritis, unspecified site: Secondary | ICD-10-CM | POA: Insufficient documentation

## 2015-08-02 DIAGNOSIS — G43909 Migraine, unspecified, not intractable, without status migrainosus: Secondary | ICD-10-CM | POA: Insufficient documentation

## 2015-08-02 DIAGNOSIS — I10 Essential (primary) hypertension: Secondary | ICD-10-CM | POA: Insufficient documentation

## 2015-08-02 DIAGNOSIS — I471 Supraventricular tachycardia: Secondary | ICD-10-CM | POA: Insufficient documentation

## 2015-08-02 DIAGNOSIS — R931 Abnormal findings on diagnostic imaging of heart and coronary circulation: Secondary | ICD-10-CM

## 2015-08-02 DIAGNOSIS — E669 Obesity, unspecified: Secondary | ICD-10-CM | POA: Insufficient documentation

## 2015-08-02 DIAGNOSIS — Z8249 Family history of ischemic heart disease and other diseases of the circulatory system: Secondary | ICD-10-CM | POA: Insufficient documentation

## 2015-08-02 DIAGNOSIS — R002 Palpitations: Secondary | ICD-10-CM | POA: Insufficient documentation

## 2015-08-02 DIAGNOSIS — R079 Chest pain, unspecified: Secondary | ICD-10-CM

## 2015-08-02 DIAGNOSIS — R0789 Other chest pain: Secondary | ICD-10-CM | POA: Insufficient documentation

## 2015-08-02 DIAGNOSIS — R9439 Abnormal result of other cardiovascular function study: Secondary | ICD-10-CM | POA: Insufficient documentation

## 2015-08-02 DIAGNOSIS — D649 Anemia, unspecified: Secondary | ICD-10-CM | POA: Insufficient documentation

## 2015-08-02 HISTORY — PX: CARDIAC CATHETERIZATION: SHX172

## 2015-08-02 SURGERY — LEFT HEART CATH AND CORONARY ANGIOGRAPHY
Anesthesia: LOCAL

## 2015-08-02 MED ORDER — DIAZEPAM 5 MG PO TABS
5.0000 mg | ORAL_TABLET | ORAL | Status: AC
  Administered 2015-08-02: 5 mg via ORAL

## 2015-08-02 MED ORDER — VERAPAMIL HCL 2.5 MG/ML IV SOLN
INTRAVENOUS | Status: AC
  Filled 2015-08-02: qty 2

## 2015-08-02 MED ORDER — FENTANYL CITRATE (PF) 100 MCG/2ML IJ SOLN
INTRAMUSCULAR | Status: AC
  Filled 2015-08-02: qty 4

## 2015-08-02 MED ORDER — ACETAMINOPHEN 325 MG PO TABS: 650.0000 mg | ORAL_TABLET | ORAL | Status: DC | PRN

## 2015-08-02 MED ORDER — ONDANSETRON HCL 4 MG/2ML IJ SOLN
INTRAMUSCULAR | Status: AC
  Filled 2015-08-02: qty 2

## 2015-08-02 MED ORDER — METOPROLOL TARTRATE 1 MG/ML IV SOLN
INTRAVENOUS | Status: AC
  Filled 2015-08-02: qty 5

## 2015-08-02 MED ORDER — DIAZEPAM 5 MG PO TABS: 5.0000 mg | ORAL_TABLET | Freq: Four times a day (QID) | ORAL | Status: DC | PRN

## 2015-08-02 MED ORDER — SODIUM CHLORIDE 0.9 % IV SOLN: 250.0000 mL | INTRAVENOUS | Status: DC | PRN

## 2015-08-02 MED ORDER — LIDOCAINE HCL (PF) 1 % IJ SOLN
INTRAMUSCULAR | Status: AC
  Filled 2015-08-02: qty 30

## 2015-08-02 MED ORDER — SODIUM CHLORIDE 0.9 % IV SOLN
INTRAVENOUS | Status: DC
  Administered 2015-08-02: 07:00:00 via INTRAVENOUS

## 2015-08-02 MED ORDER — VERAPAMIL HCL 2.5 MG/ML IV SOLN
INTRA_ARTERIAL | Status: DC | PRN
  Administered 2015-08-02: 08:00:00 via INTRA_ARTERIAL

## 2015-08-02 MED ORDER — MIDAZOLAM HCL 2 MG/2ML IJ SOLN
INTRAMUSCULAR | Status: AC
Start: 2015-08-02 — End: 2015-08-02
  Filled 2015-08-02: qty 4

## 2015-08-02 MED ORDER — IOHEXOL 350 MG/ML SOLN
INTRAVENOUS | Status: DC | PRN
  Administered 2015-08-02: 105 mL via INTRACARDIAC

## 2015-08-02 MED ORDER — HEPARIN SODIUM (PORCINE) 1000 UNIT/ML IJ SOLN
INTRAMUSCULAR | Status: DC | PRN
  Administered 2015-08-02: 5000 [IU] via INTRAVENOUS

## 2015-08-02 MED ORDER — ONDANSETRON HCL 4 MG/2ML IJ SOLN
4.0000 mg | Freq: Four times a day (QID) | INTRAMUSCULAR | Status: DC | PRN
  Administered 2015-08-02: 4 mg via INTRAVENOUS

## 2015-08-02 MED ORDER — SODIUM CHLORIDE 0.9 % IJ SOLN: 3.0000 mL | INTRAMUSCULAR | Status: DC | PRN

## 2015-08-02 MED ORDER — MIDAZOLAM HCL 2 MG/2ML IJ SOLN
INTRAMUSCULAR | Status: DC | PRN
  Administered 2015-08-02: 1 mg via INTRAVENOUS
  Administered 2015-08-02: 2 mg via INTRAVENOUS

## 2015-08-02 MED ORDER — ASPIRIN 81 MG PO CHEW
CHEWABLE_TABLET | ORAL | Status: AC
  Filled 2015-08-02: qty 1

## 2015-08-02 MED ORDER — DIAZEPAM 5 MG PO TABS
ORAL_TABLET | ORAL | Status: AC
  Administered 2015-08-02: 5 mg via ORAL
  Filled 2015-08-02: qty 1

## 2015-08-02 MED ORDER — FENTANYL CITRATE (PF) 100 MCG/2ML IJ SOLN
INTRAMUSCULAR | Status: DC | PRN
Start: 2015-08-02 — End: 2015-08-02
  Administered 2015-08-02 (×2): 25 ug via INTRAVENOUS

## 2015-08-02 MED ORDER — MIDAZOLAM HCL 2 MG/2ML IJ SOLN
INTRAMUSCULAR | Status: AC
  Filled 2015-08-02: qty 4

## 2015-08-02 MED ORDER — NITROGLYCERIN 1 MG/10 ML FOR IR/CATH LAB
INTRA_ARTERIAL | Status: DC | PRN
  Administered 2015-08-02: 09:00:00

## 2015-08-02 MED ORDER — SODIUM CHLORIDE 0.9 % WEIGHT BASED INFUSION: 3.0000 mL/kg/h | INTRAVENOUS | Status: AC

## 2015-08-02 MED ORDER — NITROGLYCERIN 1 MG/10 ML FOR IR/CATH LAB
INTRA_ARTERIAL | Status: AC
  Filled 2015-08-02: qty 10

## 2015-08-02 MED ORDER — SODIUM CHLORIDE 0.9 % IJ SOLN: 3.0000 mL | Freq: Two times a day (BID) | INTRAMUSCULAR | Status: DC

## 2015-08-02 MED ORDER — HEPARIN SODIUM (PORCINE) 1000 UNIT/ML IJ SOLN
INTRAMUSCULAR | Status: AC
  Filled 2015-08-02: qty 1

## 2015-08-02 MED ORDER — HEPARIN (PORCINE) IN NACL 2-0.9 UNIT/ML-% IJ SOLN
INTRAMUSCULAR | Status: AC
  Filled 2015-08-02: qty 1500

## 2015-08-02 SURGICAL SUPPLY — 14 items
CATH INFINITI 5 FR JL3.5 (CATHETERS) ×2 IMPLANT
CATH INFINITI 5FR ANG PIGTAIL (CATHETERS) ×3 IMPLANT
CATH LAUNCHER 5F RADL (CATHETERS) IMPLANT
CATH OPTITORQUE TIG 4.0 5F (CATHETERS) ×3 IMPLANT
CATHETER LAUNCHER 5F RADL (CATHETERS) ×3
DEVICE RAD COMP TR BAND LRG (VASCULAR PRODUCTS) ×3 IMPLANT
GLIDESHEATH SLEND A-KIT 6F 22G (SHEATH) ×3 IMPLANT
KIT HEART LEFT (KITS) ×3 IMPLANT
PACK CARDIAC CATHETERIZATION (CUSTOM PROCEDURE TRAY) ×3 IMPLANT
SYR MEDRAD MARK V 150ML (SYRINGE) ×3 IMPLANT
TRANSDUCER W/STOPCOCK (MISCELLANEOUS) ×3 IMPLANT
TUBING CIL FLEX 10 FLL-RA (TUBING) ×3 IMPLANT
WIRE HI TORQ VERSACORE-J 145CM (WIRE) ×2 IMPLANT
WIRE SAFE-T 1.5MM-J .035X260CM (WIRE) ×5 IMPLANT

## 2015-08-02 NOTE — Discharge Instructions (Addendum)
Radial Site Care Refer to this sheet in the next few weeks. These instructions provide you with information about caring for yourself after your procedure. Your health care provider may also give you more specific instructions. Your treatment has been planned according to current medical practices, but problems sometimes occur. Call your health care provider if you have any problems or questions after your procedure. WHAT TO EXPECT AFTER THE PROCEDURE After your procedure, it is typical to have the following:  Bruising at the radial site that usually fades within 1-2 weeks.  Blood collecting in the tissue (hematoma) that may be painful to the touch. It should usually decrease in size and tenderness within 1-2 weeks. HOME CARE INSTRUCTIONS  Take medicines only as directed by your health care provider.  You may shower 24-48 hours after the procedure or as directed by your health care provider. Remove the bandage (dressing) and gently wash the site with plain soap and water. Pat the area dry with a clean towel. Do not rub the site, because this may cause bleeding.  Do not take baths, swim, or use a hot tub until your health care provider approves.  Check your insertion site every day for redness, swelling, or drainage.  Do not apply powder or lotion to the site.  Do not flex or bend the affected arm for 24 hours or as directed by your health care provider.  Do not push or pull heavy objects with the affected arm for 24 hours or as directed by your health care provider.  Do not lift over 10 lb (4.5 kg) for 5 days after your procedure or as directed by your health care provider.  Ask your health care provider when it is okay to:  Return to work or school.  Resume usual physical activities or sports.  Resume sexual activity.  Do not drive home if you are discharged the same day as the procedure. Have someone else drive you.  You may drive 24 hours after the procedure unless otherwise  instructed by your health care provider.  Do not operate machinery or power tools for 24 hours after the procedure.  If your procedure was done as an outpatient procedure, which means that you went home the same day as your procedure, a responsible adult should be with you for the first 24 hours after you arrive home.  Keep all follow-up visits as directed by your health care provider. This is important. SEEK MEDICAL CARE IF:  You have a fever.  You have chills.  You have increased bleeding from the radial site. Hold pressure on the site. SEEK IMMEDIATE MEDICAL CARE IF:  You have unusual pain at the radial site.  You have redness, warmth, or swelling at the radial site.  You have drainage (other than a small amount of blood on the dressing) from the radial site.  The radial site is bleeding, and the bleeding does not stop after 30 minutes of holding steady pressure on the site.  Your arm or hand becomes pale, cool, tingly, or numb.   This information is not intended to replace advice given to you by your health care provider. Make sure you discuss any questions you have with your health care provider.   Document Released: 11/10/2010 Document Revised: 10/29/2014 Document Reviewed: 04/26/2014 Elsevier Interactive Patient Education 2016 [place] from Work, [employer], or Physical Activity Barbara Dunn needs to be excused from: __X___ Work _____ [employer] _____ Physical activity beginning now and  through the following date:Thursday 08/04/15 He or she may return to work or school but should still avoid the following physical activity or activities from now until Monday 08/08/15 Activity restrictions include: __X___ Lifting more than [place] Provider Name (printed): Shelva Majestic Date:08/02/15  This information is not intended to replace advice given to you by your health care provider. Make sure you discuss any questions you have with your health care  provider.   Document Released: 04/03/2001 Document Revised: 10/29/2014 Document Reviewed: 05/10/2014 Elsevier Interactive Patient Education [employer].

## 2015-08-02 NOTE — Interval H&P Note (Signed)
Cath Lab Visit (complete for each Cath Lab visit)  Clinical Evaluation Leading to the Procedure:   ACS: No.  Non-ACS:    Anginal Classification: CCS II  Anti-ischemic medical therapy: Minimal Therapy (1 class of medications)  Non-Invasive Test Results: Low-risk stress test findings: cardiac mortality <1%/year  Prior CABG: No previous CABG      History and Physical Interval Note:  08/02/2015 7:50 AM  Barbara Dunn  has presented today for surgery, with the diagnosis of abnormal stress test  The various methods of treatment have been discussed with the patient and family. After consideration of risks, benefits and other options for treatment, the patient has consented to  Procedure(s): Left Heart Cath and Coronary Angiography (N/A) as a surgical intervention .  The patient's history has been reviewed, patient examined, no change in status, stable for surgery.  I have reviewed the patient's chart and labs.  Questions were answered to the patient's satisfaction.     Carmon Sahli A

## 2015-08-02 NOTE — H&P (View-Only) (Signed)
Cardiology Office Note   Date:  07/13/2015   ID:  Barbara Dunn, DOB 10/13/1971, MRN [id number]  PCP:  Cordelia Poche, MD  Cardiologist:   Sharol Harness, MD   Chief Complaint  Patient presents with  . Chest Pain  . Dizziness  . Shortness of Breath    EVERY ONCE IN A WHILE  . Edema    LEFT FOOT DUE TO KNEE INJURY  . New Evaluation      History of Present Illness: Barbara Dunn is a 44 y.o. female with hypertension and obesity who presents for an evaluation of chest pain.  Barbara Dunn was evaluated by her PCP on 05/30/15 and reported atypical chest pain symptoms.  She was referred to cardiology for further evaluation.  Barbara Dunn reports chest pain that's been intermittent over the last year. It last for 2 hours but she did have one episode that lasted all day. She presented to the ED Last October and had 2 sets of negative cardiac troponins and a negative d-dimer. The pain improved with Toradol and was thought to be musculoskeletal.   The pain is substernal and under her left breast. It does not radiate. There is no associated shortness of breath, nausea, vomiting, lightheadedness or dizziness. She also denies diaphoresis. It is not particularly tied to exertion. She is unsure if it gets worse when she walks because she typically sits still when it occurs.  It is sharp and 4-8 out of 10 in severity.  Barbara Dunn reports significant weight gain since she stopped smoking cigarettes 10 years ago. She does not get any formal exercise because she is tired all the time. She also hurt her left knee which limits her ability to walk. She states that her diet is not good and that she eats all the time.    Of note Barbara Dunn's sister had a heart attack at age 55.   Past Medical History  Diagnosis Date  . Hypertension   . Anemia   . Migraines   . Obesity   . Arthritis     Past Surgical History  Procedure Laterality Date  . Cervix lesion destruction    . Tubal ligation    . Tubal ligation        Current Outpatient Prescriptions  Medication Sig Dispense Refill  . albuterol (PROVENTIL HFA;VENTOLIN HFA) 108 (90 BASE) MCG/ACT inhaler Inhale 1 puff into the lungs every 6 (six) hours as needed for wheezing or shortness of breath.    Marland Kitchen amLODipine (NORVASC) 5 MG tablet Take 1 tablet (5 mg total) by mouth daily. 30 tablet 5  . BIOTIN PO Take by mouth 2 (two) times daily.     . cetirizine (ZYRTEC) 10 MG tablet Take 10 mg by mouth daily.    . fluticasone (FLONASE) 50 MCG/ACT nasal spray Place 2 sprays into both nostrils daily. 16 g 11  . lisinopril (PRINIVIL,ZESTRIL) 20 MG tablet TAKE 1 TABLET BY MOUTH DAILY 30 tablet 8  . meclizine (ANTIVERT) 25 MG tablet Take 1 tablet (25 mg total) by mouth 3 (three) times daily as needed. 30 tablet 3  . Multiple Vitamins-Minerals (MULTIVITAMIN ADULT PO) Take by mouth daily.    . Pyridoxine HCl (VITAMIN B6 PO) Take by mouth daily.    Marland Kitchen topiramate (TOPAMAX) 50 MG tablet Take 50 mg by mouth 2 (two) times daily.     No current facility-administered medications for this visit.    Allergies:   Review of patient's allergies indicates no known  allergies.    Social History:  The patient  reports that she quit smoking about 11 years ago. She has never used smokeless tobacco. She reports that she drinks about 0.6 oz of alcohol per week. She reports that she does not use illicit drugs.   Family History:  The patient's family history includes Hypertension in her mother; Schizophrenia in her brother, brother, mother, and sister.   MI in sister at age 25.   ROS:  Please see the history of present illness.   Otherwise, review of systems are positive for none.   All other systems are reviewed and negative.    PHYSICAL EXAM: VS:  BP 128/80 mmHg  Pulse 70  Ht 5\' 2"  (1.575 m)  Wt 95.255 kg (210 lb)  BMI 38.40 kg/m2 , BMI Body mass index is 38.4 kg/(m^2). GENERAL:  Well appearing HEENT:  Pupils equal round and reactive, fundi not visualized, oral mucosa  unremarkable NECK:  No jugular venous distention, waveform within normal limits, carotid upstroke brisk and symmetric, no bruits, no thyromegaly LYMPHATICS:  No cervical adenopathy LUNGS:  Clear to auscultation bilaterally HEART:  RRR.  PMI not displaced or sustained,S1 and S2 within normal limits, no S3, no S4, no clicks, no rubs, no murmurs ABD:  Flat, positive bowel sounds normal in frequency in pitch, no bruits, no rebound, no guarding, no midline pulsatile mass, no hepatomegaly, no splenomegaly EXT:  2 plus pulses throughout, no edema, no cyanosis no clubbing SKIN:  No rashes no nodules NEURO:  Cranial nerves II through XII grossly intact, motor grossly intact throughout PSYCH:  Cognitively intact, oriented to person place and time    EKG:  EKG is ordered today. The ekg ordered today demonstrates sinus rhyhtm at 70 bpm.  Non-specific T wave flattening.   Recent Labs: 02/11/2015: ALT 30; BUN 9; Creat 0.75; Hemoglobin 12.1; Platelets 278; Potassium 3.7; Sodium 137; TSH 0.564    Lipid Panel    Component Value Date/Time   CHOL 165 11/09/2013 1703   TRIG 64 11/09/2013 1703   HDL 91 11/09/2013 1703   CHOLHDL 1.8 11/09/2013 1703   VLDL 13 11/09/2013 1703   LDLCALC 61 11/09/2013 1703      Wt Readings from Last 3 Encounters:  07/13/15 95.255 kg (210 lb)  06/01/15 95.709 kg (211 lb)  05/27/15 95.437 kg (210 lb 6.4 oz)      Other studies Reviewed: Additional studies/ records that were reviewed today include: medical record. Review of the above records demonstrates:  Please see elsewhere in the note.     ASSESSMENT AND PLAN:  # Atypical chest pain:Symptoms are quite atypical.  However she does have several risk factors including hypertension and prior tobacco abuse. Also her sister had a heart attack at age 20. Therefore we will proceed with pharmacologic nuclear stress testing. She has a left knee injury that prohibits her from walking on a treadmill.   # Hypertension: Blood  pressure well controlled. Continue amlodipine and lisinopril.  # Obesity:Ms. Gores was advised to increase her exercise to 30-40 minutes most days of the week. It was stressed that she does not have to do all that at one setting. He also recommended that she substitute some of her unhealthy snacks for fruits and vegetables. She expressed understanding.    Current medicines are reviewed at length with the patient today.  The patient does not have concerns regarding medicines.  The following changes have been made:  no change  Labs/ tests ordered today include:  Orders Placed This Encounter  Procedures  . Myocardial Perfusion Imaging  . EKG 12-Lead     Disposition:   FU with Tiffany C. Oval Linsey, MD prn.   Signed, Sharol Harness, MD  07/13/2015 12:30 PM    [facility]

## 2015-08-04 ENCOUNTER — Telehealth: Payer: Self-pay | Admitting: Cardiovascular Disease

## 2015-08-04 NOTE — Telephone Encounter (Signed)
Received a call from [place] with [facility] wanting to know if patient ok to have PT on knee.Stated patient had a cardiac cath on 08/02/15 and was told no lifting over 5 lbs after Monday 08/08/15.She wanted make sure ok for patient to do stepping exercises and ride a bike.   Spoke to [facility] he advised ok to do knee exercises.

## 2015-09-04 ENCOUNTER — Encounter (HOSPITAL_COMMUNITY): Payer: Self-pay

## 2015-09-04 ENCOUNTER — Emergency Department (HOSPITAL_COMMUNITY)
Admission: EM | Admit: 2015-09-04 | Discharge: 2015-09-05 | Disposition: A | Discharge status: Home / Self Care | Payer: Self-pay | Attending: Emergency Medicine | Admitting: Emergency Medicine

## 2015-09-04 ENCOUNTER — Emergency Department (HOSPITAL_COMMUNITY): Payer: Self-pay

## 2015-09-04 DIAGNOSIS — Z862 Personal history of diseases of the blood and blood-forming organs and certain disorders involving the immune mechanism: Secondary | ICD-10-CM | POA: Insufficient documentation

## 2015-09-04 DIAGNOSIS — I10 Essential (primary) hypertension: Secondary | ICD-10-CM | POA: Insufficient documentation

## 2015-09-04 DIAGNOSIS — Z87891 Personal history of nicotine dependence: Secondary | ICD-10-CM | POA: Insufficient documentation

## 2015-09-04 DIAGNOSIS — E669 Obesity, unspecified: Secondary | ICD-10-CM | POA: Insufficient documentation

## 2015-09-04 DIAGNOSIS — G43909 Migraine, unspecified, not intractable, without status migrainosus: Secondary | ICD-10-CM | POA: Insufficient documentation

## 2015-09-04 DIAGNOSIS — Z7951 Long term (current) use of inhaled steroids: Secondary | ICD-10-CM | POA: Insufficient documentation

## 2015-09-04 DIAGNOSIS — G930 Cerebral cysts: Secondary | ICD-10-CM

## 2015-09-04 DIAGNOSIS — R200 Anesthesia of skin: Secondary | ICD-10-CM

## 2015-09-04 DIAGNOSIS — Z9889 Other specified postprocedural states: Secondary | ICD-10-CM | POA: Insufficient documentation

## 2015-09-04 DIAGNOSIS — Z79899 Other long term (current) drug therapy: Secondary | ICD-10-CM | POA: Insufficient documentation

## 2015-09-04 DIAGNOSIS — Z7982 Long term (current) use of aspirin: Secondary | ICD-10-CM | POA: Insufficient documentation

## 2015-09-04 DIAGNOSIS — M1990 Unspecified osteoarthritis, unspecified site: Secondary | ICD-10-CM | POA: Insufficient documentation

## 2015-09-04 LAB — COMPREHENSIVE METABOLIC PANEL
ALBUMIN: 3.8 g/dL (ref 3.5–5.0)
ALT: 24 U/L (ref 14–54)
ANION GAP: 6 (ref 5–15)
AST: 28 U/L (ref 15–41)
Alkaline Phosphatase: 42 U/L (ref 38–126)
BILIRUBIN TOTAL: 0.5 mg/dL (ref 0.3–1.2)
BUN: 13 mg/dL (ref 6–20)
CHLORIDE: 107 mmol/L (ref 101–111)
CO2: 26 mmol/L (ref 22–32)
Calcium: 8.9 mg/dL (ref 8.9–10.3)
Creatinine, Ser: 0.93 mg/dL (ref 0.44–1.00)
GFR calc Af Amer: >60 mL/min (ref >60)
GFR calc non Af Amer: >60 mL/min (ref >60)
GLUCOSE: 95 mg/dL (ref 65–99)
POTASSIUM: 3.4 mmol/L — AB (ref 3.5–5.1)
SODIUM: 139 mmol/L (ref 135–145)
TOTAL PROTEIN: 6.9 g/dL (ref 6.5–8.1)

## 2015-09-04 LAB — DIFFERENTIAL
BASOS PCT: 1 %
Basophils Absolute: 0.1 10*3/uL (ref 0.0–0.1)
EOS ABS: 0.3 10*3/uL (ref 0.0–0.7)
EOS PCT: 4 %
Lymphocytes Relative: 46 %
Lymphs Abs: 4 10*3/uL (ref 0.7–4.0)
Monocytes Absolute: 0.6 10*3/uL (ref 0.1–1.0)
Monocytes Relative: 7 %
Neutro Abs: 3.6 10*3/uL (ref 1.7–7.7)
Neutrophils Relative %: 42 %

## 2015-09-04 LAB — I-STAT CHEM 8, ED
BUN: 15 mg/dL (ref 6–20)
CALCIUM ION: 1.19 mmol/L (ref 1.12–1.23)
CHLORIDE: 105 mmol/L (ref 101–111)
Creatinine, Ser: 0.9 mg/dL (ref 0.44–1.00)
Glucose, Bld: 91 mg/dL (ref 65–99)
HEMATOCRIT: 39 % (ref 36.0–46.0)
Hemoglobin: 13.3 g/dL (ref 12.0–15.0)
Potassium: 3.5 mmol/L (ref 3.5–5.1)
SODIUM: 143 mmol/L (ref 135–145)
TCO2: 24 mmol/L (ref 0–100)

## 2015-09-04 LAB — I-STAT TROPONIN, ED: Troponin i, poc: 0.01 ng/mL (ref 0.00–0.08)

## 2015-09-04 LAB — CBC
HCT: 36.7 % (ref 36.0–46.0)
Hemoglobin: 11.9 g/dL — ABNORMAL LOW (ref 12.0–15.0)
MCH: 29.2 pg (ref 26.0–34.0)
MCHC: 32.4 g/dL (ref 30.0–36.0)
MCV: 90 fL (ref 78.0–100.0)
PLATELETS: 267 10*3/uL (ref 150–400)
RBC: 4.08 MIL/uL (ref 3.87–5.11)
RDW: 12.8 % (ref 11.5–15.5)
WBC: 8.5 10*3/uL (ref 4.0–10.5)

## 2015-09-04 LAB — APTT: aPTT: 30 seconds (ref 24–37)

## 2015-09-04 LAB — CBG MONITORING, ED: GLUCOSE-CAPILLARY: 142 mg/dL — AB (ref 65–99)

## 2015-09-04 LAB — PROTIME-INR
INR: 1.07 (ref 0.00–1.49)
PROTHROMBIN TIME: 14.1 s (ref 11.6–15.2)

## 2015-09-04 NOTE — ED Notes (Signed)
Pt woke up this morning with right side of head numb, then right side of face numb.  Constant numbness.  Pt reports numbness in head x 2 last week that last several hours each.  Hand grips equal, smile symmetrical.

## 2015-09-04 NOTE — ED Notes (Signed)
Dr, Oleta Mouse at bedside

## 2015-09-04 NOTE — ED Provider Notes (Signed)
Arrival Date & Time: 09/04/15 & 1910 History   Chief Complaint  Patient presents with  . Numbness   HPI Barbara Dunn is a 44 y.o. female who presents for assessment of right side head and facial numbness. Patient endorses no facial droop. Describes it as constant burning and numbness. No endorsement of headache no endorsement neck pain or abnormal vision. Patient endorses that symptoms are constant at this time since waking up this morning endorses last week had 2 prior episodes however these resolved after approximately 1 hour. Endorses no other abnormal sensation or weakness. Patient has past medical second for hypertension recurrent migraines arthritis tubal ligation and cardiac catheterization on 08-02-2015 which revealed normal coronary arteries and normal left ventricular systolic function.  Past Medical History  I reviewed & agree with nursing's documentation of PMHx, PSHx, SHx & FHx. Past Medical History  Diagnosis Date  . Hypertension   . Anemia   . Migraines   . Obesity   . Arthritis    Past Surgical History  Procedure Laterality Date  . Cervix lesion destruction    . Tubal ligation    . Tubal ligation    . Cardiac catheterization N/A 08/02/2015    Procedure: Left Heart Cath and Coronary Angiography;  Surgeon: Troy Sine, MD;  Location: [facility];  Service: Cardiovascular;  Laterality: N/A;   Social History   Social History  . Marital Status: Single    Spouse Name: N/A  . Number of Children: N/A  . Years of Education: N/A   Social History Main Topics  . Smoking status: Former Smoker    Quit date: 12/15/2003  . Smokeless tobacco: Never Used  . Alcohol Use: 0.6 oz/week    1 Glasses of wine per week  . Drug Use: No  . Sexual Activity: Yes    Birth Control/ Protection: Other-see comments     Comment: tubal   Other Topics Concern  . None   Social History Narrative   Family History  Problem Relation Age of Onset  . Hypertension Mother   .  Schizophrenia Mother   . Schizophrenia Sister   . Schizophrenia Brother   . Schizophrenia Brother     Review of Systems   Complete Review of Systems obtained and is negative except as stated in HPI.  Allergies  Review of patient's allergies indicates no known allergies.  Home Medications   Prior to Admission medications   Medication Sig Start Date End Date Taking? Authorizing Provider  albuterol (PROVENTIL HFA;VENTOLIN HFA) 108 (90 BASE) MCG/ACT inhaler Inhale 1 puff into the lungs every 6 (six) hours as needed for wheezing or shortness of breath.   Yes Historical Provider, MD  amLODipine (NORVASC) 5 MG tablet Take 1 tablet (5 mg total) by mouth daily. 04/20/15  Yes Mariel Aloe, MD  aspirin 325 MG tablet Take 325 mg by mouth 2 (two) times daily as needed (pain).   Yes Historical Provider, MD  BIOTIN PO Take 2 tablets by mouth daily.    Yes Historical Provider, MD  cetirizine (ZYRTEC) 10 MG tablet Take 10 mg by mouth daily.   Yes Historical Provider, MD  Cholecalciferol (VITAMIN D PO) Take 2 tablets by mouth daily.   Yes Historical Provider, MD  fluticasone (FLONASE) 50 MCG/ACT nasal spray Place 2 sprays into both nostrils daily. Patient taking differently: Place 2 sprays into both nostrils daily as needed for allergies.  02/11/15  Yes Mariel Aloe, MD  lisinopril (PRINIVIL,ZESTRIL) 20 MG tablet TAKE  1 TABLET BY MOUTH DAILY 05/05/15  Yes Mariel Aloe, MD  meclizine (ANTIVERT) 25 MG tablet Take 1 tablet (25 mg total) by mouth 3 (three) times daily as needed. Patient taking differently: Take 25 mg by mouth daily.  05/27/15  Yes Carlyle Dolly, MD  Multiple Vitamins-Minerals (MULTIVITAMIN ADULT PO) Take 1 tablet by mouth daily.    Yes Historical Provider, MD  Pyridoxine HCl (VITAMIN B6 PO) Take 1 tablet by mouth daily.    Yes Historical Provider, MD  SUMAtriptan (IMITREX) 50 MG tablet Take 50 mg by mouth every 2 (two) hours as needed for migraine. May repeat in 2 hours if headache  persists or recurs.   Yes Historical Provider, MD  topiramate (TOPAMAX) 50 MG tablet Take 50 mg by mouth 2 (two) times daily as needed (headache).    Yes Historical Provider, MD    Physical Exam  BP 104/71 mmHg  Pulse 72  Temp(Src) 98.3 F (36.8 C) (Oral)  Resp 13  Ht 5\' 3"  (1.6 m)  Wt 217 lb 12.8 oz (98.793 kg)  BMI 38.59 kg/m2  SpO2 100%  LMP 08/29/2015 Physical Exam Vitals & Nursing notes reviewed. CONST: female, in no acute distress. Appears WD/WN & stated age. HEAD: [place]/AT. EYES: PERRL. No conjunctival injection & lids symmetrical. ENMT: External nose & ears atraumatic. MM moist.  Oropharynx w/o swelling or exudates. NECK: Supple, w/o meningismus. Trachea midline w/o JVD. Stridor absent. CVS: S1/S2 audible w/o gallops. Murmur absent. Peripheral pulses 2+ & equal in all extremities. Cap refill < 2 seconds. RESP: Respiratory effort normal. Lungs CTAB, w/o wheeze. GI: Soft, w/o TTP. Guarding & rebound absent. BS normal. BACK: W/o CVA TTP bilaterally. SKIN: Warm & dry, w/o rash. W/o open wound. NEURO: AAOx3. CN II-XIII grossly intact, excluding abnormal sensation w/ numbness of right face. Strength w/o deficit. Tremor absent. PSYCH:  Cooperative, w/ mood & affect appropriate. MSK: Extremities w/o deformity or TTP.  Joints stable, w/o warmth. W/o cyanosis.  ED Course  Procedures  Labs Review Labs Reviewed  CBC - Abnormal; Notable for the following:    Hemoglobin 11.9 (*)    All other components within normal limits  COMPREHENSIVE METABOLIC PANEL - Abnormal; Notable for the following:    Potassium 3.4 (*)    All other components within normal limits  CBG MONITORING, ED - Abnormal; Notable for the following:    Glucose-Capillary 142 (*)    All other components within normal limits  PROTIME-INR  APTT  DIFFERENTIAL  I-STAT TROPOININ, ED  I-STAT CHEM 8, ED    Imaging Review No results found.  Laboratory and Imaging results were personally reviewed by myself and  used in the medical decision making of this patient's treatment and disposition.  EKG Interpretation  EKG Interpretation  Date/Time:  Sunday September 04 2015 19:37:47 EST Ventricular Rate:  86 PR Interval:  154 QRS Duration: 80 QT Interval:  380 QTC Calculation: [id number] Axis:   54 Text Interpretation:  Normal sinus rhythm Normal ECG No significant change since last tracing Confirmed by LIU MD, DANA [phone]) on 09/04/2015 8:16:54 PM      MDM  Barbara Dunn is a 44 y.o. female with H&P as above. ED clinical course as follows:  Due to patient's endorsements laboratory work was obtained which reveals no concerning findings on CMP or CBC. Glucose within normal limits. ECG reveals normal sinus rhythm without anatomical ischemia or evidence of STEMI. No remarkable change since prior ECG. Troponin obtained and within normal limits therefore  do not suspect cardiac-related etiology. Due to concerns for intracranial abnormality such as stroke or hematoma mass or bleed I obtained CT head without contrast revealed no acute intracranial abnormalities.  Due to patient's symptomatic endorsements I obtained consultation from Neurology and after we discussed patient's ED clinical course imaging and laboratory work decision was made to obtain MRI brain without contrast. 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 No concerning findings for ischemia or other mass effect therefore patient is appropriate for discharge at this time and instructed to follow-up with neurology and primary care physician. Patient states understanding of return precautions and no further concerns or questions prior to discharge.  Disposition: Discharge.  Clinical Impression:  1. Right facial numbness   2. Arachnoid cyst    Patient care discussed with Dr. Oleta Mouse, who oversaw their evaluation & treatment &  voiced agreement. House Officer: Voncille Lo, MD, Emergency Medicine.  Voncille Lo, MD 09/09/15 [id number]  Forde Dandy, MD 09/10/15 2236

## 2015-09-05 ENCOUNTER — Emergency Department (HOSPITAL_COMMUNITY): Payer: Self-pay

## 2015-09-05 NOTE — Discharge Instructions (Signed)
Paresthesia Paresthesia is an abnormal burning or prickling sensation. This sensation is generally felt in the hands, arms, legs, or feet. However, it may occur in any part of the body. Usually, it is not painful. The feeling may be described as:  Tingling or numbness.  Pins and needles.  Skin crawling.  Buzzing.  Limbs falling asleep.  Itching. Most people experience temporary (transient) paresthesia at some time in their lives. Paresthesia may occur when you breathe too quickly (hyperventilation). It can also occur without any apparent cause. Commonly, paresthesia occurs when pressure is placed on a nerve. The sensation quickly goes away after the pressure is removed. For some people, however, paresthesia is a long-lasting (chronic) condition that is caused by an underlying disorder. If you continue to have paresthesia, you may need further medical evaluation. HOME CARE INSTRUCTIONS Watch your condition for any changes. Taking the following actions may help to lessen any discomfort that you are feeling:  Avoid drinking alcohol.  Try acupuncture or massage to help relieve your symptoms.  Keep all follow-up visits as directed by your health care provider. This is important. SEEK MEDICAL CARE IF:  You continue to have episodes of paresthesia.  Your burning or prickling feeling gets worse when you walk.  You have pain, cramps, or dizziness.  You develop a rash. SEEK IMMEDIATE MEDICAL CARE IF:  You feel weak.  You have trouble walking or moving.  You have problems with speech, understanding, or vision.  You feel confused.  You cannot control your bladder or bowel movements.  You have numbness after an injury.  You faint.   This information is not intended to replace advice given to you by your health care provider. Make sure you discuss any questions you have with your health care provider.   Document Released: 09/28/2002 Document Revised: 02/22/2015 Document Reviewed:  10/04/2014 Elsevier Interactive Patient Education 2016 Else[employer] Arachnoid Cysts An arachnoid cyst is a fluid-filled sac in the space between your brain or spinal cord and one of the membranes that covers the brain or spinal cord (arachnoid membrane). The cyst is filled with clear fluid (cerebrospinal fluid) that protects and nourishes your brain and spinal cord. Arachnoid cysts are not cancerous. Arachnoid cysts usually develop in the middle part of your brain. They can also develop near your spinal cord. If a cyst is small and not causing symptoms, you may not need treatment. Your health care provider may check the cyst regularly to see if it grows over time. If your cyst is large enough to cause symptoms, you may need a surgical procedure to drain or remove it. CAUSES  The most common cause of an arachnoid cyst is a defect that develops before birth. This is called a primary arachnoid cyst. A secondary arachnoid cyst is less common and may be caused by:  Head injury.  Brain infection.  A tumor that develops after brain surgery. RISK FACTORS  You may be at risk for a primary arachnoid cyst if you:   Have a family history of brain cysts.  Have inherited genes that increase your risk.  Are female. You may be at risk for a secondary arachnoid cyst if you have had:  A brain injury.  A brain infection.  A brain tumor.  Brain surgery. SIGNS AND SYMPTOMS  It is possible to have an arachnoid cyst without any symptoms. If you do have symptoms, the type of symptoms will depend on the size and location of your cyst. Signs and symptoms of  an arachnoid cyst near your brain may include:  Nausea and vomiting.  Headache.  Seizures.  Dizziness and loss of balance.  Problems with hearing or vision.  Clumsiness.  Weakness. Signs and symptoms of an arachnoid cyst near your spinal cord may include:  Leg weakness and tingling.  Arm weakness and tingling.  Curved spine.  Back  pain.  Arm or leg pain.  Jerky movements of the arms or legs (spasticity). DIAGNOSIS  Your health care provider may suspect an arachnoid cyst from your signs and symptoms. You will need to have tests done to confirm the diagnosis. These may include:  CT scan.  MRI. TREATMENT  Surgery is the only treatment for an arachnoid cyst that causes symptoms. This may involve brain or spinal cord surgery to do one of the following:  Open and drain the cyst.  Drain the cyst by placing a tube (shunt) into the cyst.  Remove the cyst completely.   This information is not intended to replace advice given to you by your health care provider. Make sure you discuss any questions you have with your health care provider.   Document Released: 09/28/2002 Document Revised: 10/29/2014 Document Reviewed: 11/26/2013 Elsevier Interactive Patient Education [employer].

## 2015-09-05 NOTE — ED Provider Notes (Signed)
I saw and evaluated the patient, reviewed the resident's note and I agree with the findings and plan.   EKG Interpretation   Date/Time:  Sunday September 04 2015 19:37:47 EST Ventricular Rate:  86 PR Interval:  154 QRS Duration: 80 QT Interval:  380 QTC Calculation: 454 R Axis:   54 Text Interpretation:  Normal sinus rhythm Normal ECG No significant change  since last tracing Confirmed by Raia Amico MD, Navil Kole ([id number]) on 09/04/2015 8:16:54  PM        44  year old female who presents with right facial numbness. History of hypertension and migraine headaches. Woke up this morning with right-sided headache and right facial numbness. Reports that she has intermittently had episodes of this in the past, but usually symptoms are self resolving. States that this is not consistent with her prior migraine headaches. Denies head trauma, vision or speech changes, numbness or weakness involving her arms or legs, fevers or chills. Headache not of sudden onset maximal intensity. She does have diminished sensation to light touch over the right side of her face. Remainder of neurological exam is intact. CT head is negative, and basic blood work is unremarkable. Briefly discussed with neurology, Dr. Nicole Kindred recommended MRI. Pending at this time with results and final disposition signed out to oncoming physician,.  Forde Dandy, MD 09/05/15 [phone]

## 2015-09-05 NOTE — ED Notes (Signed)
Patient transported to MRI 

## 2015-09-05 NOTE — ED Notes (Signed)
Pt. Left with all belongings and refused wheelchair. Discharge instructions were reviewed and all questions were answered.  

## 2015-09-12 ENCOUNTER — Inpatient Hospital Stay: Payer: Self-pay | Admitting: Family Medicine

## 2015-09-21 ENCOUNTER — Encounter: Payer: Self-pay | Admitting: Family Medicine

## 2015-09-21 ENCOUNTER — Ambulatory Visit: Payer: Self-pay | Attending: Family Medicine | Admitting: Family Medicine

## 2015-09-21 VITALS — BP 124/86 | HR 70 | Temp 98.0°F | Resp 14 | Ht 63.0 in | Wt 221.4 lb

## 2015-09-21 DIAGNOSIS — G43009 Migraine without aura, not intractable, without status migrainosus: Secondary | ICD-10-CM | POA: Insufficient documentation

## 2015-09-21 DIAGNOSIS — Z0000 Encounter for general adult medical examination without abnormal findings: Secondary | ICD-10-CM | POA: Insufficient documentation

## 2015-09-21 DIAGNOSIS — R200 Anesthesia of skin: Secondary | ICD-10-CM

## 2015-09-21 DIAGNOSIS — M25562 Pain in left knee: Secondary | ICD-10-CM | POA: Insufficient documentation

## 2015-09-21 DIAGNOSIS — R208 Other disturbances of skin sensation: Secondary | ICD-10-CM | POA: Insufficient documentation

## 2015-09-21 DIAGNOSIS — Z79899 Other long term (current) drug therapy: Secondary | ICD-10-CM | POA: Insufficient documentation

## 2015-09-21 DIAGNOSIS — Z87891 Personal history of nicotine dependence: Secondary | ICD-10-CM | POA: Insufficient documentation

## 2015-09-21 DIAGNOSIS — Z7982 Long term (current) use of aspirin: Secondary | ICD-10-CM | POA: Insufficient documentation

## 2015-09-21 DIAGNOSIS — I10 Essential (primary) hypertension: Secondary | ICD-10-CM | POA: Insufficient documentation

## 2015-09-21 MED ORDER — AMLODIPINE BESYLATE 5 MG PO TABS
5.0000 mg | ORAL_TABLET | Freq: Every day | ORAL | Status: DC
Start: 2015-09-21 — End: 2016-01-17

## 2015-09-21 MED ORDER — SUMATRIPTAN SUCCINATE 50 MG PO TABS: 100.0000 mg | ORAL_TABLET | ORAL | Status: DC | PRN

## 2015-09-21 MED ORDER — MECLIZINE HCL 25 MG PO TABS: 25.0000 mg | ORAL_TABLET | Freq: Three times a day (TID) | ORAL | Status: DC | PRN

## 2015-09-21 MED ORDER — FLUTICASONE PROPIONATE 50 MCG/ACT NA SUSP: 2.0000 | Freq: Every day | NASAL | Status: DC

## 2015-09-21 MED ORDER — LISINOPRIL 20 MG PO TABS: 20.0000 mg | ORAL_TABLET | Freq: Every day | ORAL | Status: DC

## 2015-09-21 MED ORDER — TOPIRAMATE 50 MG PO TABS: 50.0000 mg | ORAL_TABLET | Freq: Two times a day (BID) | ORAL | Status: DC

## 2015-09-21 NOTE — Progress Notes (Signed)
CC: ED follow-up for right facial numbness.  HPI: Barbara Dunn is a 44 y.o. female here today for a follow up visit after being seen at the ED for right facial numbness on 09/05/15. 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  Today she does complain of having intermittent right facial numbness with no weakness in any of her extremities and denies any headache, nausea or vomiting.  Of note she does have a history of migraines which are infrequent but has been out of her Topamax and Imitrex which she was receiving while she had insurance and followed up at the family practice clinic. She also has left knee pain from injury at her job which occurred 5 months ago and uses a knee brace with a topical analgesic.  No Known Allergies Past Medical History  Diagnosis Date  . Hypertension   . Anemia   . Migraines   . Obesity   . Arthritis    Current Outpatient Prescriptions on File Prior to Visit  Medication Sig Dispense Refill  . albuterol (PROVENTIL HFA;VENTOLIN HFA) 108 (90 BASE) MCG/ACT inhaler Inhale 1 puff into the lungs every 6 (six) hours as needed for wheezing or shortness of breath.    Marland Kitchen amLODipine (NORVASC) 5 MG tablet Take 1 tablet (5 mg total) by mouth daily. 30 tablet 5  . aspirin 325 MG tablet Take 325 mg by mouth 2 (two) times daily as needed (pain).    Marland Kitchen BIOTIN PO Take 2 tablets by mouth daily.     . cetirizine (ZYRTEC) 10 MG tablet Take 10 mg by mouth daily.    . Cholecalciferol (VITAMIN D PO) Take 2 tablets by mouth daily.    . fluticasone (FLONASE) 50 MCG/ACT nasal spray Place 2 sprays into both nostrils daily. (Patient taking differently: Place 2 sprays into both nostrils daily as needed for allergies. ) 16 g 11  . lisinopril (PRINIVIL,ZESTRIL) 20 MG tablet TAKE 1 TABLET BY MOUTH DAILY 30 tablet 8  . meclizine (ANTIVERT) 25 MG tablet Take 1 tablet (25 mg total) by  mouth 3 (three) times daily as needed. (Patient taking differently: Take 25 mg by mouth daily. ) 30 tablet 3  . Multiple Vitamins-Minerals (MULTIVITAMIN ADULT PO) Take 1 tablet by mouth daily.     . Pyridoxine HCl (VITAMIN B6 PO) Take 1 tablet by mouth daily.     Marland Kitchen topiramate (TOPAMAX) 50 MG tablet Take 50 mg by mouth 2 (two) times daily as needed (headache).      No current facility-administered medications on file prior to visit.   Family History  Problem Relation Age of Onset  . Hypertension Mother   . Schizophrenia Mother   . Schizophrenia Sister   . Schizophrenia Brother   . Schizophrenia Brother    Social History   Social History  . Marital Status: Single    Spouse Name: N/A  . Number of Children: N/A  . Years of Education: N/A   Occupational History  . Not on file.   Social History Main Topics  . Smoking status: Former Smoker    Quit date: 12/15/2003  . Smokeless tobacco: Never Used  . Alcohol Use: 0.6 oz/week    1 Glasses of wine per week  . Drug Use: No  . Sexual Activity: Yes    Birth Control/ Protection: Other-see comments     Comment: tubal   Other Topics Concern  .  Not on file   Social History Narrative    Review of Systems: Constitutional: Negative for fever, chills, diaphoresis, activity change, appetite change and fatigue. HENT: Negative for ear pain, nosebleeds, congestion, facial swelling, rhinorrhea, neck pain, neck stiffness and ear discharge.  Eyes: Negative for pain, discharge, redness, itching and visual disturbance. Respiratory: Negative for cough, choking, chest tightness, shortness of breath, wheezing and stridor.  Cardiovascular: Negative for chest pain, palpitations and leg swelling. Gastrointestinal: Negative for abdominal distention. Genitourinary: Negative for dysuria, urgency, frequency, hematuria, flank pain, decreased urine volume, difficulty urinating and dyspareunia.  Musculoskeletal: see hpi. Neurological: Negative for  dizziness, tremors, seizures, syncope, facial asymmetry, speech difficulty, weakness, light-headedness, negative for headaches, positive for numbness Hematological: Negative for adenopathy. Does not bruise/bleed easily. Psychiatric/Behavioral: Negative for hallucinations, behavioral problems, confusion, dysphoric mood, decreased concentration and agitation.    Objective:   Filed Vitals:   09/21/15 1449  BP: 124/86  Pulse: 70  Temp: 98 F (36.7 C)  Resp: 14    Physical Exam: Constitutional: Patient appears well-developed and well-nourished. No distress. HENT: Normocephalic, atraumatic, External right and left ear normal. Oropharynx is clear and moist.  Eyes: Conjunctivae and EOM are normal. PERRLA, no scleral icterus. Neck: Normal ROM. Neck supple. No JVD. No tracheal deviation. No thyromegaly. CVS: RRR, S1/S2 +, no murmurs, no gallops, no carotid bruit.  Pulmonary: Effort and breath sounds normal, no stridor, rhonchi, wheezes, rales.  Abdominal: Soft. BS +,  no distension, tenderness, rebound or guarding.  Musculoskeletal: Mild tenderness in the left knee on range of motion; right knee is normal  Lymphadenopathy: No lymphadenopathy noted, cervical, inguinal or axillary Neuro: Alert. Normal reflexes, muscle tone coordination. No cranial nerve deficit. Skin: Skin is warm and dry. No rash noted. Not diaphoretic. No erythema. No pallor. Psychiatric: Normal mood and affect. Behavior, judgment, thought content normal.  Lab Results  Component Value Date   WBC 8.5 09/04/2015   HGB 13.3 09/04/2015   HCT 39.0 09/04/2015   MCV 90.0 09/04/2015   PLT 267 09/04/2015   Lab Results  Component Value Date   CREATININE 0.90 09/04/2015   BUN 15 09/04/2015   NA 143 09/04/2015   K 3.5 09/04/2015   CL 105 09/04/2015   CO2 26 09/04/2015    Lab Results  Component Value Date   HGBA1C 5.3 02/11/2015   Lipid Panel     Component Value Date/Time   CHOL 165 11/09/2013 1703   TRIG 64  11/09/2013 1703   HDL 91 11/09/2013 1703   CHOLHDL 1.8 11/09/2013 1703   VLDL 13 11/09/2013 1703   LDLCALC 61 11/09/2013 1703    CLINICAL DATA: Right-sided headache and facial numbness.  EXAM: CT HEAD WITHOUT CONTRAST  TECHNIQUE: Contiguous axial images were obtained from the base of the skull through the vertex without intravenous contrast.  COMPARISON: None.  FINDINGS: The ventricles demonstrate a cavum septum variant. Ventricles are otherwise within normal. Cisterns and remaining CSF spaces are normal. There is no mass, mass effect, shift of midline structures or acute hemorrhage. No evidence of acute infarction. Bones soft tissues are within normal.  IMPRESSION: No acute intracranial findings.   Electronically Signed  By: Marin Olp M.D.  On: 09/04/2015 20:05  CLINICAL DATA: Intermittent episodes of RIGHT facial and head numbness. History of hypertension, migraines.  EXAM: MRI HEAD WITHOUT CONTRAST  TECHNIQUE: Multiplanar, multiecho pulse sequences of the brain and surrounding structures were obtained without intravenous contrast.  COMPARISON: CT head September 04, 2015  FINDINGS: The ventricles and  sulci are normal for patient's age, cavum septum pellucidum et vergae, normal variant. No abnormal parenchymal signal, mass lesions, mass effect. Tiny foci of T2 bright signal within the axial upper cervical spinal cord, without corresponding abnormality on the coronal T2 or remaining sequences, likely representing flow related artifact or partial volume of obex. No reduced diffusion to suggest acute ischemia. No susceptibility artifact to suggest hemorrhage.  No abnormal extra-axial fluid collections. 19 x 11 mm RIGHT cerebellar pontine angle arachnoid cyst. No extra-axial masses though, contrast enhanced sequences would be more sensitive. Normal major intracranial vascular flow voids seen at the skull base.  Ocular globes and orbital  contents are unremarkable though not tailored for evaluation. No abnormal sellar expansion. Visualized paranasal sinuses and mastoid air cells are well-aerated. No suspicious calvarial bone marrow signal. Cerebellar tonsils descend 2-3 mm below the foramen magnum though, are not pointed in appearance, nondiagnostic for Chiari 1 malformation. Craniocervical junction maintained. Small lymph nodes within the neck partially characterized.  IMPRESSION: No acute intracranial process.  19 x 12 mm RIGHT cerebral pontine angle arachnoid cyst.   Electronically Signed  By: Elon Alas M.D.  On: 09/05/2015 02:07      Assessment and plan:  Hypertension: Controlled. Continue antihypertensive  Right facial numbness: Intermittent. Advised to continue aspirin. Risk factors modification in the event that this is a TIA. Advised to keep appointment with neurology which comes up on 10/12/15.  Left knee pain: Uses a topical analgesic with relief symptoms.   Advised to come in for CPE at next office visit.       Arnoldo Morale, [facility] [phone] 09/21/2015, 2:56 PM

## 2015-09-21 NOTE — Progress Notes (Signed)
Hospital follow up for right facial numbness She has an appointment 10/12/15 She was told she "had a cyst in her head"and she does not know what that is and if its causing her symptoms She would like refills on all medications sent to our pharmacy She had insurance but cancelled it due to cost She would like flu shot today

## 2015-10-12 ENCOUNTER — Ambulatory Visit (INDEPENDENT_AMBULATORY_CARE_PROVIDER_SITE_OTHER): Payer: Self-pay | Admitting: Neurology

## 2015-10-12 ENCOUNTER — Other Ambulatory Visit (INDEPENDENT_AMBULATORY_CARE_PROVIDER_SITE_OTHER): Payer: Self-pay

## 2015-10-12 ENCOUNTER — Encounter: Payer: Self-pay | Admitting: Neurology

## 2015-10-12 ENCOUNTER — Ambulatory Visit: Payer: Self-pay | Attending: Family Medicine

## 2015-10-12 VITALS — BP 120/76 | HR 84 | Ht 63.0 in | Wt 218.0 lb

## 2015-10-12 DIAGNOSIS — R208 Other disturbances of skin sensation: Secondary | ICD-10-CM

## 2015-10-12 DIAGNOSIS — D333 Benign neoplasm of cranial nerves: Secondary | ICD-10-CM

## 2015-10-12 DIAGNOSIS — R200 Anesthesia of skin: Secondary | ICD-10-CM

## 2015-10-12 LAB — VITAMIN B12: VITAMIN B 12: 432 pg/mL (ref 211–911)

## 2015-10-12 NOTE — Progress Notes (Signed)
NEUROLOGY CONSULTATION NOTE  [name] MRN: [id number] DOB: 28-Jan-1971  Referring provider: ED referral Primary care provider: Dr. Lonny Prude  Reason for consult:  Right facial numbness  HISTORY OF PRESENT ILLNESS: Barbara Dunn is a 44 year old right-handed female with hypertension and migraines who presents for right facial numbness.  History obtained by patient and ED note.  Imaging of brain CT and MRI reviewed.  In early November, she developed numbness of the right side of her face.  There was no associated headache, changes in hearing, facial weakness, dizziness, visual disturbance or focal numbness and weakness of the extremities.  She occasionally reports tinnitus but nothing new.  She denies rash, fever, GI or URI symptoms.  CT of the head was unremarkable.  MRI of the brain showed right cerebral pontine angle arachnoid cyst but no acute intracranial process.  PAST MEDICAL HISTORY: Past Medical History  Diagnosis Date  . Hypertension   . Anemia   . Migraines   . Obesity   . Arthritis     PAST SURGICAL HISTORY: Past Surgical History  Procedure Laterality Date  . Cervix lesion destruction    . Tubal ligation    . Tubal ligation    . Cardiac catheterization N/A 08/02/2015    Procedure: Left Heart Cath and Coronary Angiography;  Surgeon: Troy Sine, MD;  Location: [facility];  Service: Cardiovascular;  Laterality: N/A;    MEDICATIONS: Current Outpatient Prescriptions on File Prior to Visit  Medication Sig Dispense Refill  . albuterol (PROVENTIL HFA;VENTOLIN HFA) 108 (90 BASE) MCG/ACT inhaler Inhale 1 puff into the lungs every 6 (six) hours as needed for wheezing or shortness of breath.    Marland Kitchen amLODipine (NORVASC) 5 MG tablet Take 1 tablet (5 mg total) by mouth daily. 30 tablet 3  . aspirin 325 MG tablet Take 325 mg by mouth 2 (two) times daily as needed (pain).    Marland Kitchen BIOTIN PO Take 2 tablets by mouth daily.     . cetirizine (ZYRTEC) 10 MG tablet Take 10 mg by  mouth daily.    . Cholecalciferol (VITAMIN D PO) Take 2 tablets by mouth daily.    . fluticasone (FLONASE) 50 MCG/ACT nasal spray Place 2 sprays into both nostrils daily. 16 g 2  . lisinopril (PRINIVIL,ZESTRIL) 20 MG tablet Take 1 tablet (20 mg total) by mouth daily. 30 tablet 2  . meclizine (ANTIVERT) 25 MG tablet Take 1 tablet (25 mg total) by mouth 3 (three) times daily as needed. 30 tablet 3  . Multiple Vitamins-Minerals (MULTIVITAMIN ADULT PO) Take 1 tablet by mouth daily.     . Pyridoxine HCl (VITAMIN B6 PO) Take 1 tablet by mouth daily.     . SUMAtriptan (IMITREX) 50 MG tablet Take 2 tablets (100 mg total) by mouth every 2 (two) hours as needed for migraine. May repeat in 2 hours if headache persists, max 200mg /day 10 tablet 2  . topiramate (TOPAMAX) 50 MG tablet Take 1 tablet (50 mg total) by mouth 2 (two) times daily. 60 tablet 2   No current facility-administered medications on file prior to visit.    ALLERGIES: No Known Allergies  FAMILY HISTORY: Family History  Problem Relation Age of Onset  . Hypertension Mother   . Schizophrenia Mother   . Schizophrenia Sister   . Schizophrenia Brother   . Schizophrenia Brother     SOCIAL HISTORY: Social History   Social History  . Marital Status: Single    Spouse Name: N/A  .  Number of Children: N/A  . Years of Education: N/A   Occupational History  . Not on file.   Social History Main Topics  . Smoking status: Former Smoker    Quit date: 12/15/2003  . Smokeless tobacco: Never Used  . Alcohol Use: 0.6 oz/week    1 Glasses of wine per week  . Drug Use: No  . Sexual Activity: Yes    Birth Control/ Protection: Other-see comments     Comment: tubal   Other Topics Concern  . Not on file   Social History Narrative   Pt has GED. CNA as well.     REVIEW OF SYSTEMS: Constitutional: No fevers, chills, or sweats, no generalized fatigue, change in appetite Eyes: No visual changes, double vision, eye pain Ear, nose and  throat: No hearing loss, ear pain, nasal congestion, sore throat Cardiovascular: No chest pain, palpitations Respiratory:  No shortness of breath at rest or with exertion, wheezes GastrointestinaI: No nausea, vomiting, diarrhea, abdominal pain, fecal incontinence Genitourinary:  No dysuria, urinary retention or frequency Musculoskeletal:  No neck pain, back pain Integumentary: No rash, pruritus, skin lesions Neurological: as above Psychiatric: No depression, insomnia, anxiety Endocrine: No palpitations, fatigue, diaphoresis, mood swings, change in appetite, change in weight, increased thirst Hematologic/Lymphatic:  No anemia, purpura, petechiae. Allergic/Immunologic: no itchy/runny eyes, nasal congestion, recent allergic reactions, rashes  PHYSICAL EXAM: Filed Vitals:   10/12/15 1415  BP: 120/76  Pulse: 84   General: No acute distress.  Head:  Normocephalic/atraumatic Eyes:  fundi unremarkable, without vessel changes, exudates, hemorrhages or papilledema. Neck: supple, no paraspinal tenderness, full range of motion Back: No paraspinal tenderness Heart: regular rate and rhythm Lungs: Clear to auscultation bilaterally. Vascular: No carotid bruits. Neurological Exam: Mental status: alert and oriented to person, place, and time, recent and remote memory intact, fund of knowledge intact, attention and concentration intact, speech fluent and not dysarthric, language intact. Cranial nerves: CN I: not tested CN II: pupils equal, round and reactive to light, visual fields intact, fundi unremarkable, without vessel changes, exudates, hemorrhages or papilledema. CN III, IV, VI:  full range of motion, no nystagmus, no ptosis CN V: decreased right V1-V3 facial sensation intact, but also including the right side of neck CN VII: upper and lower face symmetric CN VIII: hearing intact CN IX, X: gag intact, uvula midline CN XI: sternocleidomastoid and trapezius muscles intact CN XII: tongue  midline Bulk & Tone: normal, no fasciculations. Motor:  5/5 throughout  Sensation:  Pinprick and vibration sensation intact. Deep Tendon Reflexes:  2+ throughout, toes downgoing.  Finger to nose testing:  Without dysmetria.  Heel to shin:  Without dysmetria.  Gait:  Normal station and stride.  Able to turn and tandem walk. Romberg negative.  IMPRESSION: Right facial numbness, unclear etiology. Right cerebellopontine angle mass, most likely arachnoid cyst.  Incidental finding as CN VII and VIII are typically involved if symptomatic (not CN V)  PLAN: We will check MRI of brain with and without contrast to better evaluate the cyst We will check ANA, Sed Rate, B12, Lyme, HIV Follow up after tests completed.  Thank you for allowing me to take part in the care of this patient.  Metta Clines, DO  CC:  Cordelia Poche, MD

## 2015-10-12 NOTE — Patient Instructions (Signed)
We will check MRI of brain with and without contrast We will check ANA, Sed Rate, B12, Lyme, HIV Follow up after tests completed.

## 2015-10-13 LAB — LYME AB/WESTERN BLOT REFLEX: B BURGDORFERI AB IGG+ IGM: 0.58 {ISR}

## 2015-10-13 LAB — ANA: ANA: NEGATIVE

## 2015-10-13 LAB — SEDIMENTATION RATE: SED RATE: 17 mm/h (ref 0–22)

## 2015-10-13 LAB — HIV ANTIBODY (ROUTINE TESTING W REFLEX): HIV: NONREACTIVE

## 2015-10-18 ENCOUNTER — Telehealth: Payer: Self-pay

## 2015-10-18 NOTE — Telephone Encounter (Signed)
-----   Message from Pieter Partridge, DO sent at 10/13/2015  7:21 PM EST ----- So far, all labs are normal.  The only lab still pending is Lyme.  Will contact her with results when available

## 2015-10-18 NOTE — Telephone Encounter (Signed)
Message relayed to patient. Verbalized understanding and denied questions.   

## 2015-10-19 ENCOUNTER — Encounter: Payer: Self-pay | Admitting: Family Medicine

## 2015-10-19 ENCOUNTER — Ambulatory Visit: Payer: Self-pay | Attending: Family Medicine | Admitting: Family Medicine

## 2015-10-19 VITALS — BP 109/73 | HR 77 | Temp 98.8°F | Resp 13 | Ht 63.0 in | Wt 219.0 lb

## 2015-10-19 DIAGNOSIS — Z79899 Other long term (current) drug therapy: Secondary | ICD-10-CM | POA: Insufficient documentation

## 2015-10-19 DIAGNOSIS — Z23 Encounter for immunization: Secondary | ICD-10-CM

## 2015-10-19 DIAGNOSIS — B373 Candidiasis of vulva and vagina: Secondary | ICD-10-CM

## 2015-10-19 DIAGNOSIS — Z7982 Long term (current) use of aspirin: Secondary | ICD-10-CM | POA: Insufficient documentation

## 2015-10-19 DIAGNOSIS — G4700 Insomnia, unspecified: Secondary | ICD-10-CM

## 2015-10-19 DIAGNOSIS — B3731 Acute candidiasis of vulva and vagina: Secondary | ICD-10-CM

## 2015-10-19 DIAGNOSIS — Z0000 Encounter for general adult medical examination without abnormal findings: Secondary | ICD-10-CM

## 2015-10-19 DIAGNOSIS — R5383 Other fatigue: Secondary | ICD-10-CM

## 2015-10-19 DIAGNOSIS — Z3041 Encounter for surveillance of contraceptive pills: Secondary | ICD-10-CM

## 2015-10-19 LAB — CBC WITH DIFFERENTIAL/PLATELET
BASOS ABS: 0 10*3/uL (ref 0.0–0.1)
Basophils Relative: 0 % (ref 0–1)
EOS ABS: 0.1 10*3/uL (ref 0.0–0.7)
EOS PCT: 1 % (ref 0–5)
HEMATOCRIT: 37.3 % (ref 36.0–46.0)
Hemoglobin: 12.4 g/dL (ref 12.0–15.0)
LYMPHS ABS: 1.5 10*3/uL (ref 0.7–4.0)
LYMPHS PCT: 23 % (ref 12–46)
MCH: 29.6 pg (ref 26.0–34.0)
MCHC: 33.2 g/dL (ref 30.0–36.0)
MCV: 89 fL (ref 78.0–100.0)
MPV: 9.5 fL (ref 8.6–12.4)
Monocytes Absolute: 0.7 10*3/uL (ref 0.1–1.0)
Monocytes Relative: 11 % (ref 3–12)
NEUTROS PCT: 65 % (ref 43–77)
Neutro Abs: 4.4 10*3/uL (ref 1.7–7.7)
PLATELETS: 252 10*3/uL (ref 150–400)
RBC: 4.19 MIL/uL (ref 3.87–5.11)
RDW: 12.7 % (ref 11.5–15.5)
WBC: 6.7 10*3/uL (ref 4.0–10.5)

## 2015-10-19 LAB — LIPID PANEL
CHOL/HDL RATIO: 1.8 ratio (ref <=5.0)
CHOLESTEROL: 179 mg/dL (ref 125–200)
HDL: 102 mg/dL (ref >=46)
LDL Cholesterol: 64 mg/dL (ref <130)
TRIGLYCERIDES: 66 mg/dL (ref <150)
VLDL: 13 mg/dL (ref <30)

## 2015-10-19 LAB — COMPREHENSIVE METABOLIC PANEL
ALBUMIN: 3.8 g/dL (ref 3.6–5.1)
ALT: 19 U/L (ref 6–29)
AST: 24 U/L (ref 10–30)
Alkaline Phosphatase: 38 U/L (ref 33–115)
BUN: 8 mg/dL (ref 7–25)
CALCIUM: 8.5 mg/dL — AB (ref 8.6–10.2)
CHLORIDE: 106 mmol/L (ref 98–110)
CO2: 22 mmol/L (ref 20–31)
CREATININE: 0.78 mg/dL (ref 0.50–1.10)
Glucose, Bld: 82 mg/dL (ref 65–99)
Potassium: 3.9 mmol/L (ref 3.5–5.3)
Sodium: 141 mmol/L (ref 135–146)
TOTAL PROTEIN: 6.7 g/dL (ref 6.1–8.1)
Total Bilirubin: 0.5 mg/dL (ref 0.2–1.2)

## 2015-10-19 MED ORDER — FLUCONAZOLE 150 MG PO TABS: 150.0000 mg | ORAL_TABLET | Freq: Once | ORAL | Status: DC

## 2015-10-19 MED ORDER — NORGESTIMATE-ETH ESTRADIOL 0.25-35 MG-MCG PO TABS: 1.0000 | ORAL_TABLET | Freq: Every day | ORAL | Status: DC

## 2015-10-19 NOTE — Progress Notes (Signed)
Patient here for CPE with pap and breast exam She would like her vitamin D, iron, and iodine levels checked She reports feeling tired all the time but also states she does not sleep well She would like a refill on her birth control pills

## 2015-10-19 NOTE — Progress Notes (Signed)
Subjective:  Patient ID: Barbara Dunn, female    DOB: 09-16-1971  Age: 44 y.o. MRN: [id number]  CC: Annual Exam   HPI Barbara Dunn presents for a complete physical exam and would like to have her Vitamin D level and iron level drawn because she sometimes is fatigued. She also has insomnia and we have discussed sleep hygiene as she would like to hold off on medications at this time.  Outpatient Prescriptions Prior to Visit  Medication Sig Dispense Refill  . albuterol (PROVENTIL HFA;VENTOLIN HFA) 108 (90 BASE) MCG/ACT inhaler Inhale 1 puff into the lungs every 6 (six) hours as needed for wheezing or shortness of breath.    Marland Kitchen amLODipine (NORVASC) 5 MG tablet Take 1 tablet (5 mg total) by mouth daily. 30 tablet 3  . aspirin 325 MG tablet Take 325 mg by mouth 2 (two) times daily as needed (pain).    Marland Kitchen BIOTIN PO Take 2 tablets by mouth daily.     . cetirizine (ZYRTEC) 10 MG tablet Take 10 mg by mouth daily.    . Cholecalciferol (VITAMIN D PO) Take 2 tablets by mouth daily.    . fluticasone (FLONASE) 50 MCG/ACT nasal spray Place 2 sprays into both nostrils daily. 16 g 2  . lisinopril (PRINIVIL,ZESTRIL) 20 MG tablet Take 1 tablet (20 mg total) by mouth daily. 30 tablet 2  . meclizine (ANTIVERT) 25 MG tablet Take 1 tablet (25 mg total) by mouth 3 (three) times daily as needed. 30 tablet 3  . Multiple Vitamins-Minerals (MULTIVITAMIN ADULT PO) Take 1 tablet by mouth daily.     . Pyridoxine HCl (VITAMIN B6 PO) Take 1 tablet by mouth daily.     . SUMAtriptan (IMITREX) 50 MG tablet Take 2 tablets (100 mg total) by mouth every 2 (two) hours as needed for migraine. May repeat in 2 hours if headache persists, max 200mg /day 10 tablet 2  . topiramate (TOPAMAX) 50 MG tablet Take 1 tablet (50 mg total) by mouth 2 (two) times daily. 60 tablet 2   No facility-administered medications prior to visit.    ROS Review of Systems  Constitutional: Positive for fatigue. Negative for activity change and appetite  change.  HENT: Negative for congestion, sinus pressure and sore throat.   Eyes: Negative for visual disturbance.  Respiratory: Negative for cough, chest tightness, shortness of breath and wheezing.   Cardiovascular: Negative for chest pain and palpitations.  Gastrointestinal: Negative for abdominal pain, constipation and abdominal distention.  Endocrine: Negative for polydipsia.  Genitourinary: Negative for dysuria and frequency.  Musculoskeletal: Negative for back pain and arthralgias.  Skin: Negative for rash.  Neurological: Negative for tremors, light-headedness and numbness.  Hematological: Does not bruise/bleed easily.  Psychiatric/Behavioral: Positive for sleep disturbance. Negative for behavioral problems and agitation.    Objective:  BP 109/73 mmHg  Pulse 77  Temp(Src) 98.8 F (37.1 C)  Resp 13  Ht 5\' 3"  (1.6 m)  Wt 219 lb (99.338 kg)  BMI 38.80 kg/m2  SpO2 100%  LMP 09/28/2015  BP/Weight 10/19/2015 10/12/2015 [id number]  Systolic BP 0000000 [id number] [id number]  Diastolic BP 73 76 86  Wt. (Lbs) 219 218 221.4  BMI 38.8 38.63 39.23      Physical Exam  Constitutional: She is oriented to person, place, and time. She appears well-developed and well-nourished. No distress.  HENT:  Head: Normocephalic.  Right Ear: External ear normal.  Left Ear: External ear normal.  Nose: Nose normal.  Mouth/Throat: Oropharynx is clear and moist.  Eyes: Conjunctivae  and EOM are normal. Pupils are equal, round, and reactive to light.  Neck: Normal range of motion. No JVD present.  Cardiovascular: Normal rate, regular rhythm, normal heart sounds and intact distal pulses.  Exam reveals no gallop.   No murmur heard. Pulmonary/Chest: Effort normal and breath sounds normal. No respiratory distress. She has no wheezes. She has no rales. She exhibits no tenderness.  Abdominal: Soft. Bowel sounds are normal. She exhibits no distension and no mass. There is no tenderness.  Genitourinary: No breast swelling,  tenderness, discharge or bleeding. There is no lesion on the right labia. There is no lesion on the left labia. Vaginal discharge ( White cheesy discharge) found.   Breasts: dense breast tissue. Cervix: Normal, no cervical motion tenderness, normal adnexa  Musculoskeletal: Normal range of motion. She exhibits no edema or tenderness.  Neurological: She is alert and oriented to person, place, and time. She has normal reflexes.  Skin: Skin is warm and dry. She is not diaphoretic.  Psychiatric: She has a normal mood and affect.     Assessment & Plan:   1. Health care maintenance She was given a scholarship form for mammogram given she has no medical coverage - Tdap vaccine greater than or equal to 7yo IM - MM Digital Screening; Future - Lipid panel - Comprehensive metabolic panel - Cytology - PAP [place]  2. Vaginal candidiasis Treated - fluconazole (DIFLUCAN) 150 MG tablet; Take 1 tablet (150 mg total) by mouth once.  Dispense: 1 tablet; Refill: 0  3. Other fatigue Unknown etiology; could also be secondary to insomnia - CBC with Differential - Vitamin D, 25-hydroxy  4. Insomnia Sleep hygiene for now Will consider sleep study if this persists  5. Oral contraceptive use Refilled - norgestimate-ethinyl estradiol (ORTHO-CYCLEN,SPRINTEC,PREVIFEM) 0.25-35 MG-MCG tablet; Take 1 tablet by mouth daily.  Dispense: 1 Package; Refill: 6   Meds ordered this encounter  Medications  . fluconazole (DIFLUCAN) 150 MG tablet    Sig: Take 1 tablet (150 mg total) by mouth once.    Dispense:  1 tablet    Refill:  0  . norgestimate-ethinyl estradiol (ORTHO-CYCLEN,SPRINTEC,PREVIFEM) 0.25-35 MG-MCG tablet    Sig: Take 1 tablet by mouth daily.    Dispense:  1 Package    Refill:  6    Follow-up: Return in about 3 months (around 01/17/2016) for follow up on hypertension.  This note has been created with Surveyor, quantity. Any transcriptional errors  are unintentional.     Arnoldo Morale MD

## 2015-10-20 ENCOUNTER — Telehealth: Payer: Self-pay | Admitting: Family Medicine

## 2015-10-20 LAB — CYTOLOGY - PAP

## 2015-10-20 LAB — VITAMIN D 25 HYDROXY (VIT D DEFICIENCY, FRACTURES): Vit D, 25-Hydroxy: 42 ng/mL (ref 30–100)

## 2015-10-20 LAB — CERVICOVAGINAL ANCILLARY ONLY: WET PREP (BD AFFIRM): NEGATIVE

## 2015-10-20 NOTE — Telephone Encounter (Signed)
Patient called and name and date of birth verified.  Results given.

## 2015-10-20 NOTE — Telephone Encounter (Signed)
MEDICATION REFILL albuterol (PROVENTIL HFA;VENTOLIN HFA) 108 (90 BASE) MCG/ACT inhaler

## 2015-10-20 NOTE — Telephone Encounter (Signed)
-----   Message from Arnoldo Morale, MD sent at 10/20/2015  1:43 PM EST ----- Normal cholesterol, normal blood count, normal vitamin D levels, negative for GC, chlamydia, PAP is pending.

## 2015-10-22 LAB — CERVICOVAGINAL ANCILLARY ONLY
Chlamydia: NEGATIVE
NEISSERIA GONORRHEA: NEGATIVE

## 2015-10-25 ENCOUNTER — Telehealth: Payer: Self-pay

## 2015-10-25 NOTE — Telephone Encounter (Signed)
CMA called patient, patient verified name and DOB. Patient was given lab results. Patient verbalized that she understood, with no further concerns.

## 2015-10-25 NOTE — Telephone Encounter (Signed)
-----   Message from Arnoldo Morale, MD sent at 10/25/2015  8:32 AM EST ----- Please inform the patient that labs are normal. Thank you.

## 2015-10-27 ENCOUNTER — Ambulatory Visit (HOSPITAL_COMMUNITY)
Admission: RE | Admit: 2015-10-27 | Discharge: 2015-10-27 | Disposition: A | Discharge status: Home / Self Care | Payer: Self-pay | Source: Ambulatory Visit | Attending: Neurology | Admitting: Neurology

## 2015-10-27 ENCOUNTER — Telehealth: Payer: Self-pay

## 2015-10-27 DIAGNOSIS — I10 Essential (primary) hypertension: Secondary | ICD-10-CM | POA: Insufficient documentation

## 2015-10-27 DIAGNOSIS — G43909 Migraine, unspecified, not intractable, without status migrainosus: Secondary | ICD-10-CM | POA: Insufficient documentation

## 2015-10-27 DIAGNOSIS — R200 Anesthesia of skin: Secondary | ICD-10-CM | POA: Insufficient documentation

## 2015-10-27 DIAGNOSIS — D333 Benign neoplasm of cranial nerves: Secondary | ICD-10-CM | POA: Insufficient documentation

## 2015-10-27 DIAGNOSIS — R208 Other disturbances of skin sensation: Secondary | ICD-10-CM | POA: Insufficient documentation

## 2015-10-27 MED ORDER — GADOBENATE DIMEGLUMINE 529 MG/ML IV SOLN
20.0000 mL | Freq: Once | INTRAVENOUS | Status: AC | PRN
  Administered 2015-10-27: 20 mL via INTRAVENOUS

## 2015-10-27 NOTE — Telephone Encounter (Signed)
-----   Message from Pieter Partridge, DO sent at 10/27/2015  3:32 PM EST ----- MRI of brain does verify that it is a benign cyst in the back of the brain, which is an incidental finding and not the cause of her facial numbness.  At this point, I have no explanation for her facial numbness.

## 2015-10-27 NOTE — Telephone Encounter (Signed)
Left message on machine for pt to return call to the office.  

## 2015-10-28 NOTE — Telephone Encounter (Signed)
Message relayed to patient. Verbalized understanding and denied questions.   

## 2015-11-14 MED FILL — MONO-LINYAH 28 TABLET: 0.25-35 | 28 days supply | Qty: 28 | Fill #1

## 2015-11-14 MED FILL — AMLODIPINE BESYLATE 5 MG TA: 5 | 30 days supply | Qty: 30 | Fill #2

## 2015-11-15 MED FILL — TRAVEL SICKNESS 25 MG TAB C: 25 | 10 days supply | Qty: 30 | Fill #1

## 2015-11-15 MED FILL — ?LISINOPRIL 20 MG TABLET: 20 | 30 days supply | Qty: 30 | Fill #1

## 2015-12-06 ENCOUNTER — Telehealth: Payer: Self-pay

## 2015-12-06 NOTE — Telephone Encounter (Signed)
Barbara Dunn stated they did not get any records that I have sent over 4 times so I tried resending them again on 12/06/15 from the downstairs fax and they failed all three times I called the agent in charge and left a message letting her know to call me back with a different fax number. I have the records printed out and in the [employer]ML[employer] box at the [employer] checkout desk, just waiting on getting a working fax number from them.

## 2015-12-11 NOTE — Telephone Encounter (Signed)
Records sent over on 12/09/15 got conformation that they went through will close enc.

## 2015-12-12 MED FILL — MONO-LINYAH 28 TABLET: 0.25-35 | 28 days supply | Qty: 28 | Fill #2

## 2015-12-12 MED FILL — FLUTICASONE PROP 50 MCG SPR: 50 | 30 days supply | Qty: 16 | Fill #1

## 2015-12-12 MED FILL — AMLODIPINE BESYLATE 5 MG TA: 5 | 30 days supply | Qty: 30 | Fill #3

## 2015-12-12 MED FILL — LISINOPRIL 20 MG TABLET: 20 | 30 days supply | Qty: 30 | Fill #2

## 2016-01-09 ENCOUNTER — Telehealth: Payer: Self-pay | Admitting: Family Medicine

## 2016-01-09 NOTE — Telephone Encounter (Signed)
Returned patient advice call and left message that she could return call to clinic at [phone]

## 2016-01-09 NOTE — Telephone Encounter (Signed)
Pt. Called stating that her entire body itches. Pt. Said she does not have any rashes. Please f/u with pt.

## 2016-01-10 ENCOUNTER — Encounter (HOSPITAL_COMMUNITY): Payer: Self-pay

## 2016-01-10 ENCOUNTER — Emergency Department (HOSPITAL_COMMUNITY): Payer: Self-pay

## 2016-01-10 ENCOUNTER — Emergency Department (HOSPITAL_COMMUNITY)
Admission: EM | Admit: 2016-01-10 | Discharge: 2016-01-10 | Disposition: A | Discharge status: Home / Self Care | Payer: Self-pay | Attending: Emergency Medicine | Admitting: Emergency Medicine

## 2016-01-10 DIAGNOSIS — M1990 Unspecified osteoarthritis, unspecified site: Secondary | ICD-10-CM | POA: Insufficient documentation

## 2016-01-10 DIAGNOSIS — R208 Other disturbances of skin sensation: Secondary | ICD-10-CM | POA: Insufficient documentation

## 2016-01-10 DIAGNOSIS — Z793 Long term (current) use of hormonal contraceptives: Secondary | ICD-10-CM | POA: Insufficient documentation

## 2016-01-10 DIAGNOSIS — Z862 Personal history of diseases of the blood and blood-forming organs and certain disorders involving the immune mechanism: Secondary | ICD-10-CM | POA: Insufficient documentation

## 2016-01-10 DIAGNOSIS — R079 Chest pain, unspecified: Secondary | ICD-10-CM | POA: Insufficient documentation

## 2016-01-10 DIAGNOSIS — Z87891 Personal history of nicotine dependence: Secondary | ICD-10-CM | POA: Insufficient documentation

## 2016-01-10 DIAGNOSIS — R202 Paresthesia of skin: Secondary | ICD-10-CM | POA: Insufficient documentation

## 2016-01-10 DIAGNOSIS — R51 Headache: Secondary | ICD-10-CM | POA: Insufficient documentation

## 2016-01-10 DIAGNOSIS — Z7982 Long term (current) use of aspirin: Secondary | ICD-10-CM | POA: Insufficient documentation

## 2016-01-10 DIAGNOSIS — I10 Essential (primary) hypertension: Secondary | ICD-10-CM | POA: Insufficient documentation

## 2016-01-10 DIAGNOSIS — Z79899 Other long term (current) drug therapy: Secondary | ICD-10-CM | POA: Insufficient documentation

## 2016-01-10 DIAGNOSIS — M542 Cervicalgia: Secondary | ICD-10-CM | POA: Insufficient documentation

## 2016-01-10 DIAGNOSIS — E669 Obesity, unspecified: Secondary | ICD-10-CM | POA: Insufficient documentation

## 2016-01-10 DIAGNOSIS — Z7951 Long term (current) use of inhaled steroids: Secondary | ICD-10-CM | POA: Insufficient documentation

## 2016-01-10 DIAGNOSIS — Z9889 Other specified postprocedural states: Secondary | ICD-10-CM | POA: Insufficient documentation

## 2016-01-10 LAB — BASIC METABOLIC PANEL
ANION GAP: 7 (ref 5–15)
BUN: 9 mg/dL (ref 6–20)
CALCIUM: 8.9 mg/dL (ref 8.9–10.3)
CO2: 25 mmol/L (ref 22–32)
Chloride: 108 mmol/L (ref 101–111)
Creatinine, Ser: 0.72 mg/dL (ref 0.44–1.00)
GFR calc Af Amer: >60 mL/min (ref >60)
GFR calc non Af Amer: >60 mL/min (ref >60)
GLUCOSE: 85 mg/dL (ref 65–99)
Potassium: 3.3 mmol/L — ABNORMAL LOW (ref 3.5–5.1)
Sodium: 140 mmol/L (ref 135–145)

## 2016-01-10 LAB — CBC
HCT: 37.5 % (ref 36.0–46.0)
HEMOGLOBIN: 12 g/dL (ref 12.0–15.0)
MCH: 28.9 pg (ref 26.0–34.0)
MCHC: 32 g/dL (ref 30.0–36.0)
MCV: 90.4 fL (ref 78.0–100.0)
Platelets: 261 10*3/uL (ref 150–400)
RBC: 4.15 MIL/uL (ref 3.87–5.11)
RDW: 12.5 % (ref 11.5–15.5)
WBC: 5.7 10*3/uL (ref 4.0–10.5)

## 2016-01-10 LAB — I-STAT TROPONIN, ED: TROPONIN I, POC: 0 ng/mL (ref 0.00–0.08)

## 2016-01-10 MED ORDER — SUMATRIPTAN SUCCINATE 6 MG/0.5ML ~~LOC~~ SOLN
6.0000 mg | Freq: Once | SUBCUTANEOUS | Status: AC
  Administered 2016-01-10: 6 mg via SUBCUTANEOUS
  Filled 2016-01-10: qty 0.5

## 2016-01-10 NOTE — ED Notes (Signed)
Pt presents with c/o chest pain and right sided facial numbness that started this morning. Pt reports that this morning, she had an episode where she was unable to speak and could not form words. Pt has no facial droop or aphasia at this time but reports that the whole right side of her face is numb radiating into her head as well. Pt reports the chest pain is in the center of her chest, stabbing in nature.

## 2016-01-10 NOTE — ED Provider Notes (Signed)
CSN: [id number]     Arrival date & time 01/10/16  0757 History   First MD Initiated Contact with Patient 01/10/16 [phone]     Chief Complaint  Patient presents with  . Chest Pain  . Facial numbness      (Consider location/radiation/quality/duration/timing/severity/associated sxs/prior Treatment) HPI   Barbara Dunn is a 45 y.o. female who presents with facial numbness that is persistent since earlier this morning, it was present as she awoke. She also had a short period of being unable to talk that lasted less than 15 minutes. She has pain in her right head, and right neck, which is worse with movement. She denies fever, chills, nausea, vomiting, weakness or dizziness. She has previously been evaluated for right facial numbness and to have right-sided subarachnoid cyst. This was comprehensively evaluated by neurology and with an MRI for imaging. It was felt that the cyst was not the source of her numbness. She states that her usual migraine headaches, are pain only. She has not had one recently. There are no other no modifying factors.   Past Medical History  Diagnosis Date  . Hypertension   . Anemia   . Migraines   . Obesity   . Arthritis    Past Surgical History  Procedure Laterality Date  . Cervix lesion destruction    . Tubal ligation    . Tubal ligation    . Cardiac catheterization N/A 08/02/2015    Procedure: Left Heart Cath and Coronary Angiography;  Surgeon: Troy Sine, MD;  Location: [facility];  Service: Cardiovascular;  Laterality: N/A;   Family History  Problem Relation Age of Onset  . Hypertension Mother   . Schizophrenia Mother   . Schizophrenia Sister   . Schizophrenia Brother   . Schizophrenia Brother    Social History  Substance Use Topics  . Smoking status: Former Smoker    Quit date: 12/15/2003  . Smokeless tobacco: Never Used  . Alcohol Use: 0.6 oz/week    1 Glasses of wine per week   OB History    No data available     Review of Systems   All other systems reviewed and are negative.     Allergies  Review of patient's allergies indicates no known allergies.  Home Medications   Prior to Admission medications   Medication Sig Start Date End Date Taking? Authorizing Provider  albuterol (PROVENTIL HFA;VENTOLIN HFA) 108 (90 BASE) MCG/ACT inhaler Inhale 1 puff into the lungs every 6 (six) hours as needed for wheezing or shortness of breath.   Yes Historical Provider, MD  amLODipine (NORVASC) 5 MG tablet Take 1 tablet (5 mg total) by mouth daily. 09/21/15  Yes Arnoldo Morale, MD  aspirin 325 MG tablet Take 325 mg by mouth daily.    Yes Historical Provider, MD  BIOTIN PO Take 2 tablets by mouth daily.    Yes Historical Provider, MD  cetirizine (ZYRTEC) 10 MG tablet Take 10 mg by mouth daily.   Yes Historical Provider, MD  diphenhydrAMINE (BENADRYL) 2 % cream Apply 1 application topically 3 (three) times daily as needed for itching.   Yes Historical Provider, MD  diphenhydrAMINE (BENADRYL) 25 mg capsule Take 25 mg by mouth every 6 (six) hours as needed for itching.   Yes Historical Provider, MD  docusate sodium (COLACE) 100 MG capsule Take 100 mg by mouth 2 (two) times daily.   Yes Historical Provider, MD  fluticasone (FLONASE) 50 MCG/ACT nasal spray Place 2 sprays into both nostrils  daily. 09/21/15  Yes Arnoldo Morale, MD  lisinopril (PRINIVIL,ZESTRIL) 20 MG tablet Take 1 tablet (20 mg total) by mouth daily. 09/21/15  Yes Arnoldo Morale, MD  meclizine (ANTIVERT) 25 MG tablet Take 1 tablet (25 mg total) by mouth 3 (three) times daily as needed. Patient taking differently: Take 25 mg by mouth 3 (three) times daily as needed for dizziness.  09/21/15  Yes Arnoldo Morale, MD  Multiple Vitamins-Minerals (MULTIVITAMIN ADULT PO) Take 1 tablet by mouth daily.    Yes Historical Provider, MD  norgestimate-ethinyl estradiol (ORTHO-CYCLEN,SPRINTEC,PREVIFEM) 0.25-35 MG-MCG tablet Take 1 tablet by mouth daily. 10/19/15  Yes Arnoldo Morale, MD  SUMAtriptan  (IMITREX) 50 MG tablet Take 2 tablets (100 mg total) by mouth every 2 (two) hours as needed for migraine. May repeat in 2 hours if headache persists, max 200mg /day 09/21/15  Yes Arnoldo Morale, MD  fluconazole (DIFLUCAN) 150 MG tablet Take 1 tablet (150 mg total) by mouth once. Patient not taking: Reported on 01/10/2016 10/19/15   Arnoldo Morale, MD  topiramate (TOPAMAX) 50 MG tablet Take 1 tablet (50 mg total) by mouth 2 (two) times daily. Patient not taking: Reported on 01/10/2016 09/21/15   Arnoldo Morale, MD   BP 147/86 mmHg  Pulse 87  Temp(Src) 98.2 F (36.8 C) (Oral)  Resp 20  SpO2 100%  LMP 12/13/2015 Physical Exam  Constitutional: She is oriented to person, place, and time. She appears well-developed and well-nourished.  HENT:  Head: Normocephalic and atraumatic.  Right Ear: External ear normal.  Left Ear: External ear normal.  Eyes: Conjunctivae and EOM are normal. Pupils are equal, round, and reactive to light.  Neck: Normal range of motion and phonation normal. Neck supple.  Cardiovascular: Normal rate, regular rhythm and normal heart sounds.   Pulmonary/Chest: Effort normal and breath sounds normal. She exhibits no bony tenderness.  Abdominal: Soft. There is no tenderness.  Musculoskeletal: Normal range of motion.  Neurological: She is alert and oriented to person, place, and time. No cranial nerve deficit or sensory deficit. She exhibits normal muscle tone. Coordination normal.  Mild light touch dysesthesia of the right forehead, right cheek and right chin area. No dysarthria and aphasia or nystagmus. No pronator drift.  Skin: Skin is warm, dry and intact.  Psychiatric: She has a normal mood and affect. Her behavior is normal. Judgment and thought content normal.  Nursing note and vitals reviewed.   ED Course  Procedures (including critical care time) 08:40- very low NIH score, and improving symptoms, make this a non-thrombolysis consideration case.  Medications  SUMAtriptan  (IMITREX) injection 6 mg (6 mg Subcutaneous Given 01/10/16 0857)    Patient Vitals for the past 24 hrs:  BP Temp Temp src Pulse Resp SpO2  01/10/16 0812 147/86 mmHg 98.2 F (36.8 C) Oral 87 20 100 %    10:00 AM Reevaluation with update and discussion. After initial assessment and treatment, an updated evaluation reveals No change in clinical status. Findings discussed with the patient and all questions were answered. [place] METABOLIC PANEL - Abnormal; Notable for the following:    Potassium 3.3 (*)    All other components within normal limits  CBC  I-STAT TROPOININ, ED    Imaging Review Dg Chest 2 View  01/10/2016  CLINICAL DATA:  RIGHT-sided facial numbness.  Chest pain. EXAM: CHEST  2 VIEW COMPARISON:  08/16/2014 FINDINGS: Normal mediastinum and cardiac silhouette. Normal pulmonary vasculature. No evidence of effusion, infiltrate, or pneumothorax.  No acute bony abnormality. IMPRESSION: No acute cardiopulmonary process. Electronically Signed   By: Suzy Bouchard M.D.   On: 01/10/2016 08:46   Ct Head Wo Contrast  01/10/2016  CLINICAL DATA:  Right-sided facial numbness for 1 day EXAM: CT HEAD WITHOUT CONTRAST TECHNIQUE: Contiguous axial images were obtained from the base of the skull through the vertex without intravenous contrast. COMPARISON:  Head CT September 04, 2015; brain MRI October 27, 2015 FINDINGS: The ventricles are normal in size and configuration. There is a cavum septum pellucidum, an anatomic variant. The known left cerebellopontine angle region arachnoid cyst is better delineated on MR; the decreased attenuation in this region is stable. There is no new mass evident. There is no hemorrhage, subdural or epidural fluid collection, or midline shift. Gray-white compartments appear unremarkable. No acute infarct evident. The bony calvarium appears intact. Visualized mastoid air cells are clear. No intraorbital lesions are evident.  IMPRESSION: Persistent decreased attenuation in the area of a known right cerebellopontine angle arachnoid cyst. No new mass seen. No acute infarct evident. No hemorrhage or extra-axial fluid collection. Cavum septum pellucidum is an anatomic variant. Electronically Signed   By: Lowella Grip III M.D.   On: 01/10/2016 09:34   I have personally reviewed and evaluated these images and lab results as part of my medical decision-making.   EKG Interpretation   Date/Time:  Tuesday January 10 2016 08:09:46 EDT Ventricular Rate:  81 PR Interval:  148 QRS Duration: 83 QT Interval:  377 QTC Calculation: 438 R Axis:   50 Text Interpretation:  Sinus rhythm since last tracing no significant  change Confirmed by Shandrea Lusk  MD, Marielis Samara [id number]) on 01/10/2016 8:30:07 AM      MDM   Final diagnoses:  Paresthesia    Nonspecific right facial numbness, which is recurrent, and has been previously Intensively evaluated. Today, there is no indication for CVA, metabolic instability or serious bacterial infection.  Nursing Notes Reviewed/ Care Coordinated Applicable Imaging Reviewed Interpretation of Laboratory Data incorporated into ED treatment  The patient appears reasonably screened and/or stabilized for discharge and I doubt any other medical condition or other [facility] requiring further screening, evaluation, or treatment in the ED at this time prior to discharge.  Plan: Home Medications- usual; Home Treatments- rest; return here if the recommended treatment, does not improve the symptoms; Recommended follow up- PCP 1 week, and Neurology asap for further evaluation of numbness.     Daleen Bo, MD 01/10/16 1001

## 2016-01-10 NOTE — Discharge Instructions (Signed)
Paresthesia  Paresthesia is a burning or prickling feeling. This feeling can happen in any part of the body. It often happens in the hands, arms, legs, or feet. Usually, it is not painful. In most cases, the feeling goes away in a short time and is not a sign of a serious problem.  HOME CARE  · Avoid drinking alcohol.  · Try massage or needle therapy (acupuncture) to help with your problems.  · Keep all follow-up visits as told by your doctor. This is important.  GET HELP IF:  · You keep on having episodes of paresthesia.  · Your burning or prickling feeling gets worse when you walk.  · You have pain or cramps.  · You feel dizzy.  · You have a rash.  GET HELP RIGHT AWAY IF:  · You feel weak.  · You have trouble walking or moving.  · You have problems speaking, understanding, or seeing.  · You feel confused.  · You cannot control when you pee (urinate) or poop (bowel movement).  · You lose feeling (numbness) after an injury.  · You pass out (faint).     This information is not intended to replace advice given to you by your health care provider. Make sure you discuss any questions you have with your health care provider.     Document Released: 09/20/2008 Document Revised: 02/22/2015 Document Reviewed: 10/04/2014  Elsevier Interactive Patient Education ©2016 [employer]

## 2016-01-10 NOTE — ED Notes (Signed)
Pt left white wallet in room, RN called cell phone and left message informing pt about her belongings in ER .

## 2016-01-16 ENCOUNTER — Ambulatory Visit (INDEPENDENT_AMBULATORY_CARE_PROVIDER_SITE_OTHER): Payer: Self-pay | Admitting: Neurology

## 2016-01-16 ENCOUNTER — Encounter: Payer: Self-pay | Admitting: Neurology

## 2016-01-16 VITALS — BP 120/84 | HR 78 | Ht 63.0 in | Wt 217.0 lb

## 2016-01-16 DIAGNOSIS — G43109 Migraine with aura, not intractable, without status migrainosus: Secondary | ICD-10-CM

## 2016-01-16 DIAGNOSIS — G43009 Migraine without aura, not intractable, without status migrainosus: Secondary | ICD-10-CM

## 2016-01-16 MED ORDER — NORTRIPTYLINE HCL 10 MG PO CAPS: 10.0000 mg | ORAL_CAPSULE | Freq: Every day | ORAL | Status: DC

## 2016-01-16 MED FILL — NORTRIPTYLINE HCL 10 MG CAP: 10 | 30 days supply | Qty: 30 | Fill #0

## 2016-01-16 NOTE — Progress Notes (Signed)
NEUROLOGY FOLLOW UP OFFICE NOTE  Licet Ebnet [id number]  HISTORY OF PRESENT ILLNESS: Barbara Dunn is a 45 year old right-handed female with hypertension and migraines who follows up for right facial numbness.  Recent history obtained by patient and ED note.  Labs and imaging of head CT and brain MRI with contrast reviewed.  UPDATE: Labs, including ANA, Sed Rate 17, B12 432, HIV, and Lyme, were all unremarkable.  Repeat MRI of brain WITH contrast performed on 10/27/15 confirmed 19 x 15 x 13 mm right cerebellopontine angle arachnoid cyst with mild mass effect.  The facial numbness had eventually resolved.  On the morning of 01/10/16, she woke up with the facial numbness but had an episode of inability to talk, lasting less than 15 minutes.  She couldn't move her mouth to speak.  There was no associated headache or focal extremity numbness or weakness.  She presented to the ED.  CT of head showed no changes.  The numbness persisted for the rest of the day and then subsided.  She does have history of migraines.  They are holocephalic, pounding and associated with photophobia and dizziness.  They occur about once a week.  They respond quickly to sumatriptan 50mg .  She was previously on topamax, but stopped because it increased headache frequency.  HISTORY: In early November, she developed numbness of the right side of her face.  There was no associated headache, changes in hearing, facial weakness, dizziness, visual disturbance or focal numbness and weakness of the extremities.  She occasionally reports tinnitus but nothing new.  She denies rash, fever, GI or URI symptoms.  CT of the head was unremarkable.  MRI of the brain showed right cerebral pontine angle arachnoid cyst but no acute intracranial process.  PAST MEDICAL HISTORY: Past Medical History  Diagnosis Date  . Hypertension   . Anemia   . Migraines   . Obesity   . Arthritis     MEDICATIONS: Current Outpatient Prescriptions on File  Prior to Visit  Medication Sig Dispense Refill  . albuterol (PROVENTIL HFA;VENTOLIN HFA) 108 (90 BASE) MCG/ACT inhaler Inhale 1 puff into the lungs every 6 (six) hours as needed for wheezing or shortness of breath.    Marland Kitchen amLODipine (NORVASC) 5 MG tablet Take 1 tablet (5 mg total) by mouth daily. 30 tablet 3  . aspirin 325 MG tablet Take 325 mg by mouth daily.     Marland Kitchen BIOTIN PO Take 2 tablets by mouth daily.     . cetirizine (ZYRTEC) 10 MG tablet Take 10 mg by mouth daily.    . diphenhydrAMINE (BENADRYL) 2 % cream Apply 1 application topically 3 (three) times daily as needed for itching.    . diphenhydrAMINE (BENADRYL) 25 mg capsule Take 25 mg by mouth every 6 (six) hours as needed for itching.    . docusate sodium (COLACE) 100 MG capsule Take 100 mg by mouth 2 (two) times daily.    . fluticasone (FLONASE) 50 MCG/ACT nasal spray Place 2 sprays into both nostrils daily. 16 g 2  . lisinopril (PRINIVIL,ZESTRIL) 20 MG tablet Take 1 tablet (20 mg total) by mouth daily. 30 tablet 2  . meclizine (ANTIVERT) 25 MG tablet Take 1 tablet (25 mg total) by mouth 3 (three) times daily as needed. (Patient taking differently: Take 25 mg by mouth 3 (three) times daily as needed for dizziness. ) 30 tablet 3  . Multiple Vitamins-Minerals (MULTIVITAMIN ADULT PO) Take 1 tablet by mouth daily.     Marland Kitchen  norgestimate-ethinyl estradiol (ORTHO-CYCLEN,SPRINTEC,PREVIFEM) 0.25-35 MG-MCG tablet Take 1 tablet by mouth daily. 1 Package 6  . SUMAtriptan (IMITREX) 50 MG tablet Take 2 tablets (100 mg total) by mouth every 2 (two) hours as needed for migraine. May repeat in 2 hours if headache persists, max 200mg /day 10 tablet 2   No current facility-administered medications on file prior to visit.    ALLERGIES: No Known Allergies  FAMILY HISTORY: Family History  Problem Relation Age of Onset  . Hypertension Mother   . Schizophrenia Mother   . Schizophrenia Sister   . Schizophrenia Brother   . Schizophrenia Brother     SOCIAL  HISTORY: Social History   Social History  . Marital Status: Single    Spouse Name: N/A  . Number of Children: N/A  . Years of Education: N/A   Occupational History  . Not on file.   Social History Main Topics  . Smoking status: Former Smoker    Quit date: 12/15/2003  . Smokeless tobacco: Never Used  . Alcohol Use: 0.6 oz/week    1 Glasses of wine per week  . Drug Use: No  . Sexual Activity: Yes    Birth Control/ Protection: Other-see comments     Comment: tubal   Other Topics Concern  . Not on file   Social History Narrative   Pt has GED. CNA as well.     REVIEW OF SYSTEMS: Constitutional: No fevers, chills, or sweats, no generalized fatigue, change in appetite Eyes: No visual changes, double vision, eye pain Ear, nose and throat: No hearing loss, ear pain, nasal congestion, sore throat Cardiovascular: No chest pain, palpitations Respiratory:  No shortness of breath at rest or with exertion, wheezes GastrointestinaI: No nausea, vomiting, diarrhea, abdominal pain, fecal incontinence Genitourinary:  No dysuria, urinary retention or frequency Musculoskeletal:  No neck pain, back pain Integumentary: No rash, pruritus, skin lesions Neurological: as above Psychiatric: No depression, insomnia, anxiety Endocrine: No palpitations, fatigue, diaphoresis, mood swings, change in appetite, change in weight, increased thirst Hematologic/Lymphatic:  No anemia, purpura, petechiae. Allergic/Immunologic: no itchy/runny eyes, nasal congestion, recent allergic reactions, rashes  PHYSICAL EXAM: Filed Vitals:   01/16/16 1332  BP: 120/84  Pulse: 78   General: No acute distress.  Patient appears well-groomed.   Head:  Normocephalic/atraumatic Eyes:  Fundoscopic exam unremarkable without vessel changes, exudates, hemorrhages or papilledema. Neck: supple, no paraspinal tenderness, full range of motion Heart:  Regular rate and rhythm Lungs:  Clear to auscultation bilaterally Back: No  paraspinal tenderness Neurological Exam: alert and oriented to person, place, and time. Attention span and concentration intact, recent and remote memory intact, fund of knowledge intact.  Speech fluent and not dysarthric, language intact.  CN II-XII intact. Fundoscopic exam unremarkable without vessel changes, exudates, hemorrhages or papilledema.  Bulk and tone normal, muscle strength 5/5 throughout.  Sensation to light touch, temperature and vibration intact.  Deep tendon reflexes 2+ throughout, toes downgoing.  Finger to nose and heel to shin testing intact.  Gait normal, Romberg negative.  IMPRESSION: Probable migraine aura without headache Migraine without aura  PLAN: 1.  Start nortriptyline 10mg  at bedtime.  She will call in 4 weeks with update 2.  Continue sumatriptan 50mg  for abortive therapy 3.  Follow up in 3-4 months.  Metta Clines, DO  CC:  Arnoldo Morale, MD

## 2016-01-16 NOTE — Patient Instructions (Signed)
You probably had a migraine without the headache.  1.  Start nortriptyline 10mg  at bedtime to help prevent headache.  Call in 4 weeks with update and we can increase dose if needed. 2.  Continue sumatriptan (Imitrex) for headache attacks 3.  Follow up.

## 2016-01-17 ENCOUNTER — Other Ambulatory Visit: Payer: Self-pay | Admitting: Family Medicine

## 2016-01-17 MED FILL — AMLODIPINE BESYLATE 5 MG TA: 5 | 30 days supply | Qty: 30 | Fill #0

## 2016-01-17 MED FILL — MONO-LINYAH 28 TABLET: 0.25-35 | 28 days supply | Qty: 28 | Fill #3

## 2016-01-17 MED FILL — SUMATRIPTAN SUCC 50 MG TAB: 50 | 30 days supply | Qty: 9 | Fill #1

## 2016-01-17 MED FILL — LISINOPRIL 20 MG TABLET: 20 | 30 days supply | Qty: 30 | Fill #0

## 2016-01-20 ENCOUNTER — Other Ambulatory Visit: Payer: Self-pay | Admitting: Family Medicine

## 2016-01-20 DIAGNOSIS — Z1231 Encounter for screening mammogram for malignant neoplasm of breast: Secondary | ICD-10-CM

## 2016-01-23 ENCOUNTER — Encounter: Payer: Self-pay | Admitting: Family Medicine

## 2016-01-23 ENCOUNTER — Ambulatory Visit: Payer: Self-pay | Attending: Family Medicine | Admitting: Family Medicine

## 2016-01-23 VITALS — BP 124/80 | HR 64 | Temp 98.8°F | Resp 15 | Ht 63.0 in | Wt 214.2 lb

## 2016-01-23 DIAGNOSIS — E669 Obesity, unspecified: Secondary | ICD-10-CM | POA: Insufficient documentation

## 2016-01-23 DIAGNOSIS — Z6837 Body mass index (BMI) 37.0-37.9, adult: Secondary | ICD-10-CM | POA: Insufficient documentation

## 2016-01-23 DIAGNOSIS — Z79899 Other long term (current) drug therapy: Secondary | ICD-10-CM | POA: Insufficient documentation

## 2016-01-23 DIAGNOSIS — I10 Essential (primary) hypertension: Secondary | ICD-10-CM | POA: Insufficient documentation

## 2016-01-23 DIAGNOSIS — Z7982 Long term (current) use of aspirin: Secondary | ICD-10-CM | POA: Insufficient documentation

## 2016-01-23 DIAGNOSIS — J302 Other seasonal allergic rhinitis: Secondary | ICD-10-CM

## 2016-01-23 DIAGNOSIS — G43009 Migraine without aura, not intractable, without status migrainosus: Secondary | ICD-10-CM | POA: Insufficient documentation

## 2016-01-23 MED ORDER — AMLODIPINE BESYLATE 5 MG PO TABS: 5.0000 mg | ORAL_TABLET | Freq: Every day | ORAL | Status: DC

## 2016-01-23 MED ORDER — ALBUTEROL SULFATE HFA 108 (90 BASE) MCG/ACT IN AERS: 1.0000 | INHALATION_SPRAY | Freq: Four times a day (QID) | RESPIRATORY_TRACT | Status: DC | PRN

## 2016-01-23 MED ORDER — LISINOPRIL 20 MG PO TABS: 20.0000 mg | ORAL_TABLET | Freq: Every day | ORAL | Status: DC

## 2016-01-23 MED ORDER — SUMATRIPTAN SUCCINATE 50 MG PO TABS: 100.0000 mg | ORAL_TABLET | ORAL | Status: DC | PRN

## 2016-01-23 MED FILL — !VENTOLIN HFA INHALER: 108 (90 BAS | 30 days supply | Qty: 18 | Fill #0

## 2016-01-23 NOTE — Progress Notes (Signed)
Subjective:  Patient ID: Barbara Dunn, female    DOB: 18-Feb-1971  Age: 45 y.o. MRN: [id number]  CC: Hospitalization Follow-up   HPI Barbara Dunn is a 45 year old female with a history of hypertension, migraines who comes into the clinic for a follow-up visit.  She was seen by Maryanna Shape neurology a couple of days ago for right facial numbness, inability to speak lasting less than 15 minutes for which she had presented to the ED and CT was unremarkable. Symptoms subsided by the end of the day and impression of neurology was probable migraine aura without headache versus migraine without aura. She was placed on nortriptyline which the patient refused to take for fear of the side effects as she states "I have a family history of mental illness in her family".  She is requesting a refill for her antihypertensives and her Proventil which she takes for seasonal allergies.  Outpatient Prescriptions Prior to Visit  Medication Sig Dispense Refill  . aspirin 325 MG tablet Take 325 mg by mouth daily.     Marland Kitchen BIOTIN PO Take 2 tablets by mouth daily.     . cetirizine (ZYRTEC) 10 MG tablet Take 10 mg by mouth daily.    . diphenhydrAMINE (BENADRYL) 2 % cream Apply 1 application topically 3 (three) times daily as needed for itching.    . docusate sodium (COLACE) 100 MG capsule Take 100 mg by mouth 2 (two) times daily.    . fluticasone (FLONASE) 50 MCG/ACT nasal spray Place 2 sprays into both nostrils daily. 16 g 2  . meclizine (ANTIVERT) 25 MG tablet Take 1 tablet (25 mg total) by mouth 3 (three) times daily as needed. (Patient taking differently: Take 25 mg by mouth 3 (three) times daily as needed for dizziness. ) 30 tablet 3  . Multiple Vitamins-Minerals (MULTIVITAMIN ADULT PO) Take 1 tablet by mouth daily.     . norgestimate-ethinyl estradiol (ORTHO-CYCLEN,SPRINTEC,PREVIFEM) 0.25-35 MG-MCG tablet Take 1 tablet by mouth daily. 1 Package 6  . albuterol (PROVENTIL HFA;VENTOLIN HFA) 108 (90 BASE) MCG/ACT inhaler  Inhale 1 puff into the lungs every 6 (six) hours as needed for wheezing or shortness of breath.    Marland Kitchen amLODipine (NORVASC) 5 MG tablet TAKE 1 TABLET BY MOUTH DAILY. 30 tablet 0  . lisinopril (PRINIVIL,ZESTRIL) 20 MG tablet TAKE 1 TABLET BY MOUTH DAILY. 30 tablet 0  . SUMAtriptan (IMITREX) 50 MG tablet Take 2 tablets (100 mg total) by mouth every 2 (two) hours as needed for migraine. May repeat in 2 hours if headache persists, max 200mg /day 10 tablet 2  . diphenhydrAMINE (BENADRYL) 25 mg capsule Take 25 mg by mouth every 6 (six) hours as needed for itching. Reported on 01/23/2016    . nortriptyline (PAMELOR) 10 MG capsule Take 1 capsule (10 mg total) by mouth at bedtime. (Patient not taking: Reported on 01/23/2016) 30 capsule 3   No facility-administered medications prior to visit.    ROS Review of Systems  Constitutional: Negative for activity change, appetite change and fatigue.  HENT: Negative for congestion, sinus pressure and sore throat.   Eyes: Negative for visual disturbance.  Respiratory: Negative for cough, chest tightness, shortness of breath and wheezing.   Cardiovascular: Negative for chest pain and palpitations.  Gastrointestinal: Negative for abdominal pain, constipation and abdominal distention.  Endocrine: Negative for polydipsia.  Genitourinary: Negative for dysuria and frequency.  Musculoskeletal: Negative for back pain and arthralgias.  Skin: Negative for rash.  Neurological: Negative for tremors, light-headedness and numbness.  Hematological:  Does not bruise/bleed easily.  Psychiatric/Behavioral: Negative for behavioral problems and agitation.    Objective:  BP 124/80 mmHg  Pulse 64  Temp(Src) 98.8 F (37.1 C)  Resp 15  Ht 5\' 3"  (1.6 m)  Wt 214 lb 3.2 oz (97.16 kg)  BMI 37.95 kg/m2  SpO2 99%  LMP 12/13/2015  BP/Weight 01/23/2016 01/16/2016 [id number]  Systolic BP [id number]  Diastolic BP 80 84 84  Wt. (Lbs) 214.2 217 -  BMI 37.95 38.45 -      Physical Exam    Constitutional: She is oriented to person, place, and time. She appears well-developed and well-nourished.  Cardiovascular: Normal rate, normal heart sounds and intact distal pulses.   No murmur heard. Pulmonary/Chest: Effort normal and breath sounds normal. She has no wheezes. She has no rales. She exhibits no tenderness.  Abdominal: Soft. Bowel sounds are normal. She exhibits no distension and no mass. There is no tenderness.  Musculoskeletal: Normal range of motion.  Neurological: She is alert and oriented to person, place, and time.     Assessment & Plan:   1. Migraine without aura and without status migrainosus, not intractable Patient advised to pickup nortriptyline and commence it-discussed side effect profile, risk versus benefit and patient has been reassured Continue sumatriptan for breakthrough. I have discussed switching from amlodipine to a beta blocker which will be prophylactic but she is hesitant to this at this time.  2. Essential hypertension Controlled Continue antihypertensives Lifestyle changes, low-sodium, DASH diet Labs at next office visit  3. Seasonal allergies Continue OTC Zyrtec and Proventil  4. OBESITY, NOS Discussed increasing physical activity, reducing portion sizes.   Meds ordered this encounter  Medications  . amLODipine (NORVASC) 5 MG tablet    Sig: Take 1 tablet (5 mg total) by mouth daily.    Dispense:  30 tablet    Refill:  3  . lisinopril (PRINIVIL,ZESTRIL) 20 MG tablet    Sig: Take 1 tablet (20 mg total) by mouth daily.    Dispense:  30 tablet    Refill:  3  . SUMAtriptan (IMITREX) 50 MG tablet    Sig: Take 2 tablets (100 mg total) by mouth every 2 (two) hours as needed for migraine. May repeat in 2 hours if headache persists, max 200mg /day    Dispense:  10 tablet    Refill:  2  . albuterol (PROVENTIL HFA;VENTOLIN HFA) 108 (90 Base) MCG/ACT inhaler    Sig: Inhale 1 puff into the lungs every 6 (six) hours as needed for wheezing or  shortness of breath.    Dispense:  1 Inhaler    Refill:  1    Follow-up: Return in about 3 months (around 04/23/2016) for Follow-up of hypertension.   Arnoldo Morale MD

## 2016-01-23 NOTE — Patient Instructions (Signed)

## 2016-01-23 NOTE — Progress Notes (Signed)
Patient states all symptoms have disappeared  She is feeling better Neurologist started her on nortriptyline but patient is scared to take it because she states "mental illness runs in my family and I don't want the medicine to trigger anything." She is asking for a refill on her albuterol and her sumatriptan

## 2016-01-27 ENCOUNTER — Ambulatory Visit
Admission: RE | Admit: 2016-01-27 | Discharge: 2016-01-27 | Disposition: A | Discharge status: Home / Self Care | Payer: No Typology Code available for payment source | Source: Ambulatory Visit | Attending: Family Medicine | Admitting: Family Medicine

## 2016-01-27 DIAGNOSIS — Z1231 Encounter for screening mammogram for malignant neoplasm of breast: Secondary | ICD-10-CM

## 2016-02-01 ENCOUNTER — Telehealth: Payer: Self-pay

## 2016-02-01 NOTE — Telephone Encounter (Signed)
Called patient, patient wasn't available to take my call. Message was left for the patient to return my call.  Note to patient:  Please let pt know that her Mammogram is negative for malignancy.

## 2016-02-01 NOTE — Telephone Encounter (Signed)
-----   Message from Arnoldo Morale, MD sent at 01/31/2016  5:15 PM EDT ----- Mammogram is negative for malignancy

## 2016-02-01 NOTE — Telephone Encounter (Signed)
Pt. Returned call. Pt. Was giving her results.

## 2016-02-19 IMAGING — CT CT HEAD W/O CM
2 series · 16 of 30 positions shown, 20 images · non-contrast
Comparison: None.

CLINICAL DATA: Right-sided headache and facial numbness.

EXAM:
CT HEAD WITHOUT CONTRAST
TECHNIQUE: Contiguous axial images were obtained from the base of the skull
through the vertex without intravenous contrast.

[Series 201: head w/o, idose (1) · axial · non-contrast · 0.43mm/px · z∈[+81,+201]mm · 13 of 30 slices shown, 17 images]
[im 3/30  brain]
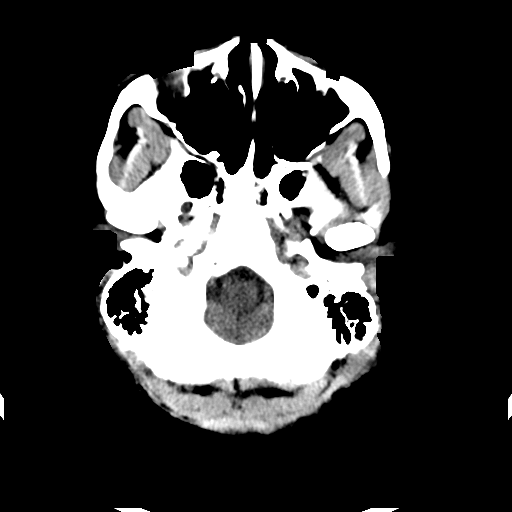
[im 3/30  bone]
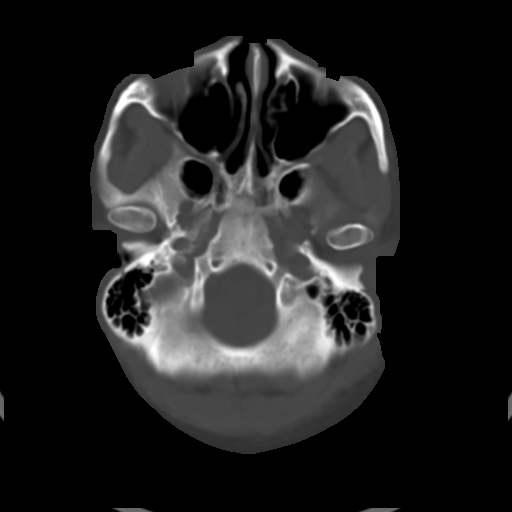
[im 5/30  brain]
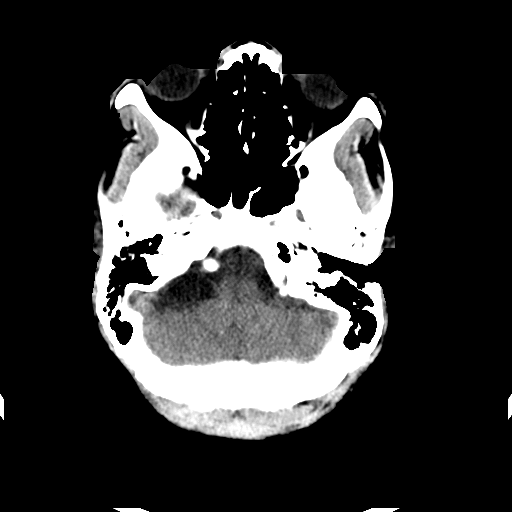
[im 7/30  brain]
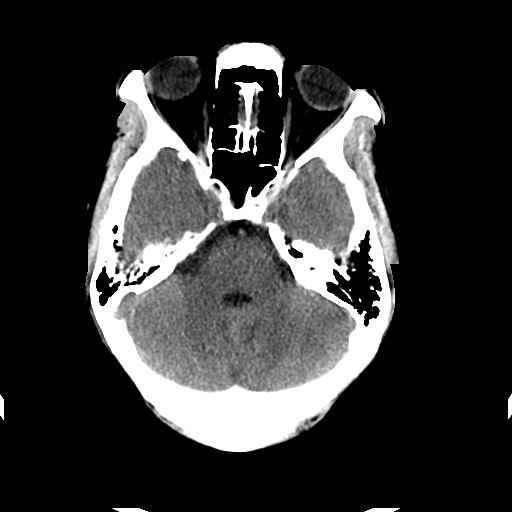
[im 9/30  brain]
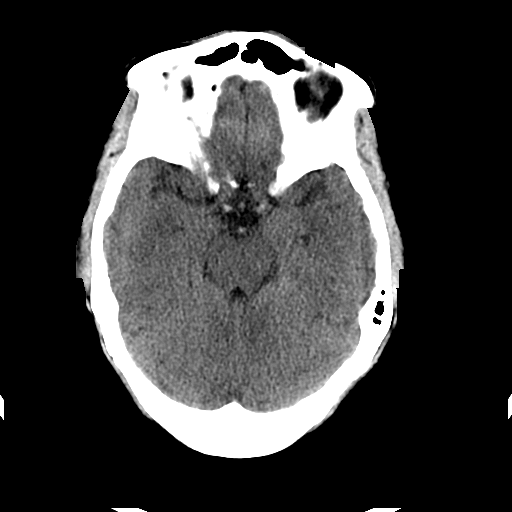
[im 11/30  brain]
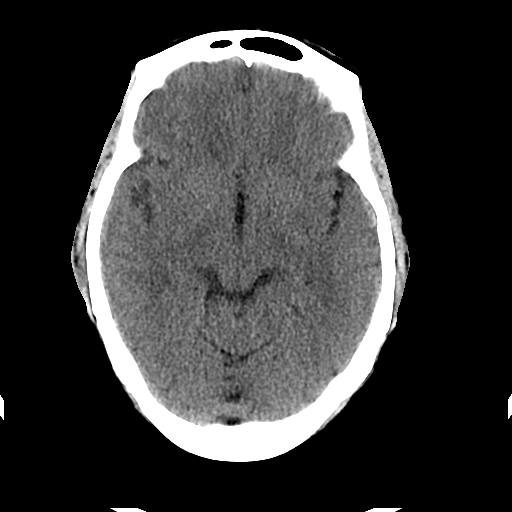
[im 11/30  bone]
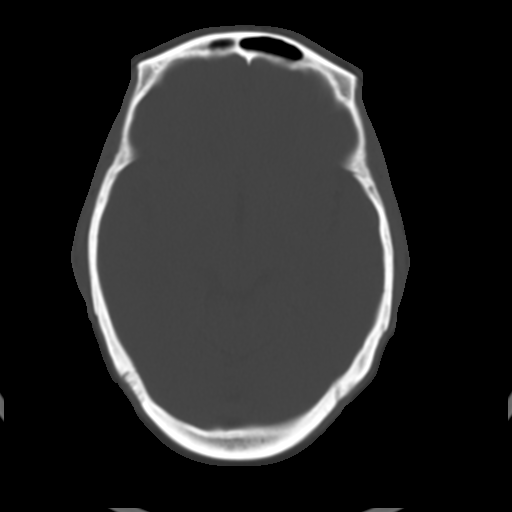
[im 13/30  brain]
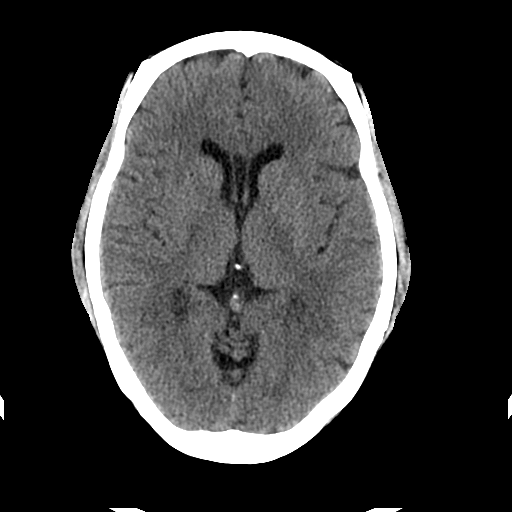
[im 15/30  brain]
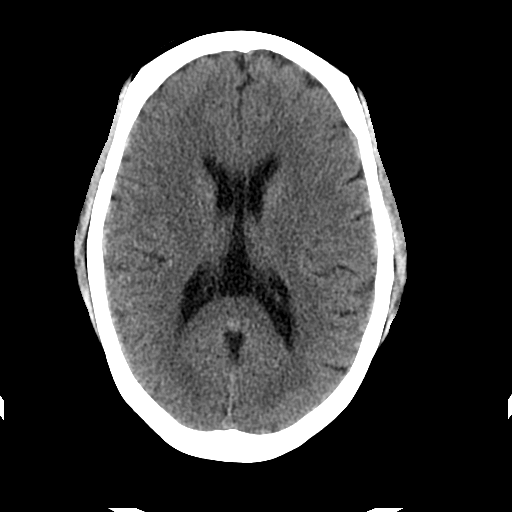
[im 17/30  brain]
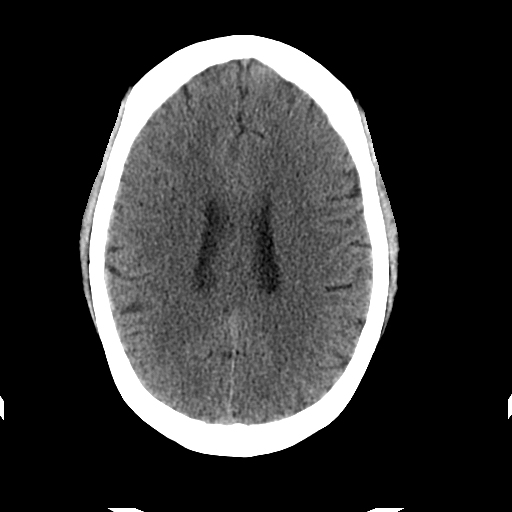
[im 19/30  brain]
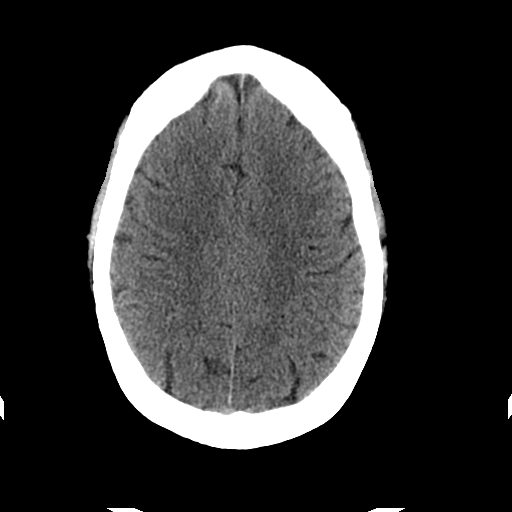
[im 19/30  bone]
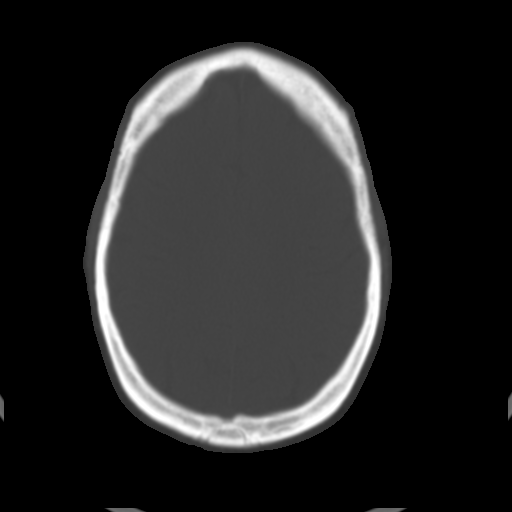
[im 21/30  brain]
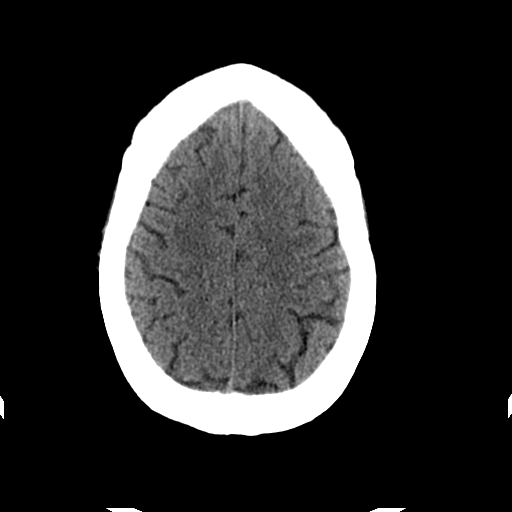
[im 23/30  brain]
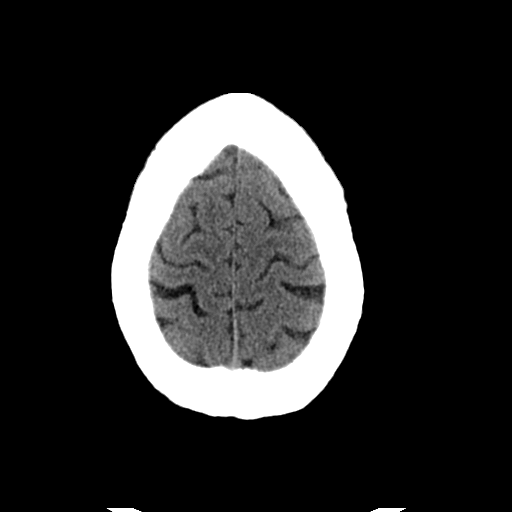
[im 25/30  brain]
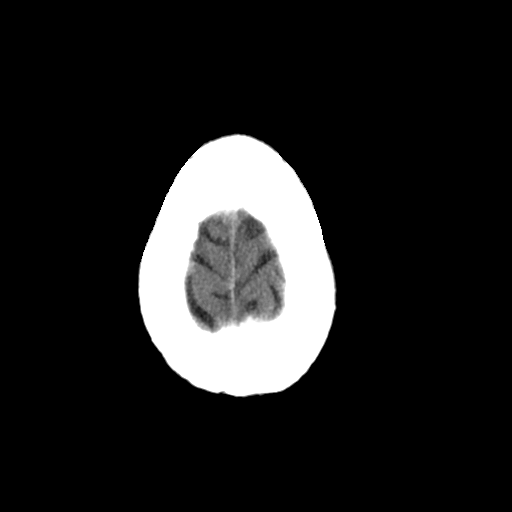
[im 27/30  brain]
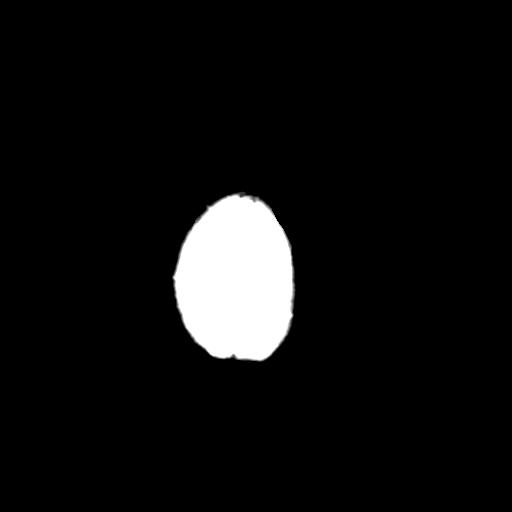
[im 27/30  bone]
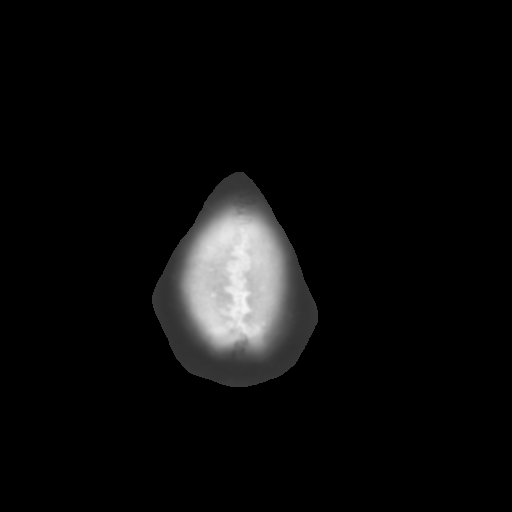

[Series 202: head w/o bone, idose (1) · axial · non-contrast · 0.43mm/px · z∈[+81,+121]mm · 3 of 30 slices shown]
[im 3/30  bone]
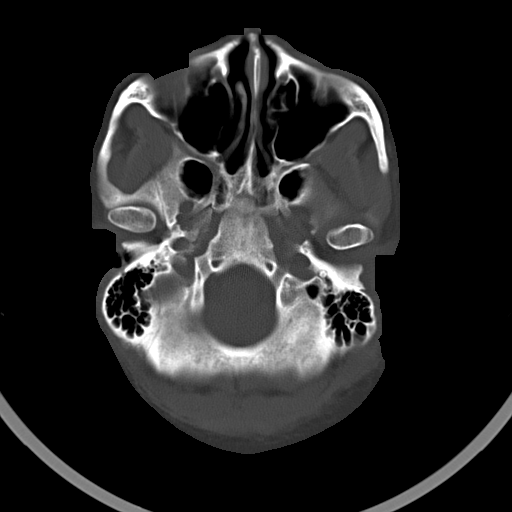
[im 7/30  bone]
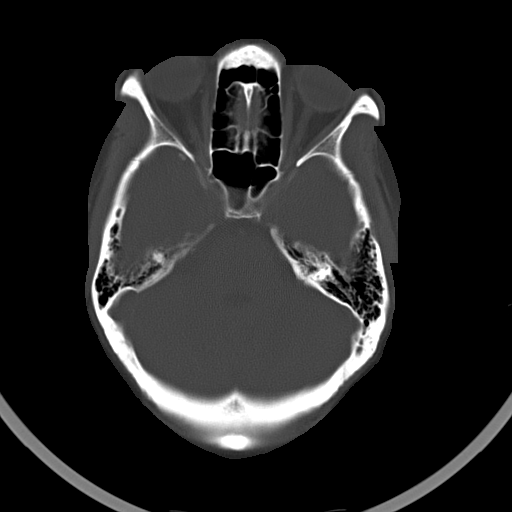
[im 11/30  bone]
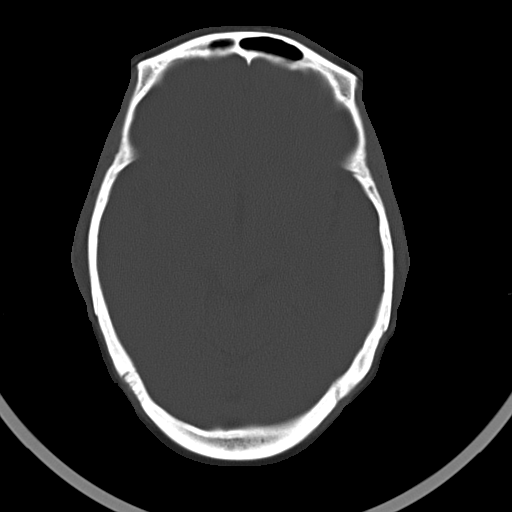

[16 of 30 positions shown; findings below may reference images not displayed]

FINDINGS: The ventricles demonstrate a cavum septum variant. Ventricles are
otherwise within normal. Cisterns and remaining CSF spaces are
normal. There is no mass, mass effect, shift of midline structures
or acute hemorrhage. No evidence of acute infarction. Bones soft
tissues are within normal.
IMPRESSION: No acute intracranial findings.

## 2016-02-21 MED FILL — NORTRIPTYLINE HCL 10 MG CAP: 10 | 30 days supply | Qty: 30 | Fill #1

## 2016-02-21 MED FILL — AMLODIPINE BESYLATE 5 MG TA: 5 | 30 days supply | Qty: 30 | Fill #1

## 2016-02-21 MED FILL — SUMATRIPTAN SUCC 50 MG TAB: 50 | 30 days supply | Qty: 9 | Fill #2

## 2016-02-21 MED FILL — MONO-LINYAH 28 TABLET: 0.25-35 | 28 days supply | Qty: 28 | Fill #4

## 2016-02-21 MED FILL — LISINOPRIL 20 MG TABLET: 20 | 30 days supply | Qty: 30 | Fill #1

## 2016-03-28 MED FILL — FLUTICASONE PROP 50 MCG SPR: 50 | 30 days supply | Qty: 16 | Fill #2

## 2016-03-28 MED FILL — LISINOPRIL 20 MG TABLET: 20 | 30 days supply | Qty: 30 | Fill #0

## 2016-03-28 MED FILL — MONO-LINYAH 28 TABLET: 0.25-35 | 28 days supply | Qty: 28 | Fill #5

## 2016-03-28 MED FILL — SUMATRIPTAN SUCC 50 MG TAB: 50 | 30 days supply | Qty: 9 | Fill #0

## 2016-03-28 MED FILL — AMLODIPINE BESYLATE 5 MG TA: 5 | 30 days supply | Qty: 30 | Fill #0

## 2016-04-23 MED FILL — LISINOPRIL 20 MG TABLET: 20 | 30 days supply | Qty: 30 | Fill #1

## 2016-04-23 MED FILL — AMLODIPINE BESYLATE 5 MG TA: 5 | 30 days supply | Qty: 30 | Fill #1

## 2016-04-23 MED FILL — SUMATRIPTAN SUCC 50 MG TAB: 50 | 30 days supply | Qty: 9 | Fill #1

## 2016-04-23 MED FILL — MONO-LINYAH 28 TABLET: 0.25-35 | 28 days supply | Qty: 28 | Fill #6

## 2016-04-25 ENCOUNTER — Encounter (HOSPITAL_COMMUNITY): Payer: Self-pay | Admitting: Emergency Medicine

## 2016-04-25 ENCOUNTER — Ambulatory Visit (HOSPITAL_COMMUNITY)
Admission: EM | Admit: 2016-04-25 | Discharge: 2016-04-25 | Disposition: A | Discharge status: Home / Self Care | Payer: Self-pay | Attending: Emergency Medicine | Admitting: Emergency Medicine

## 2016-04-25 DIAGNOSIS — W57XXXA Bitten or stung by nonvenomous insect and other nonvenomous arthropods, initial encounter: Secondary | ICD-10-CM

## 2016-04-25 DIAGNOSIS — T148 Other injury of unspecified body region: Secondary | ICD-10-CM

## 2016-04-25 MED ORDER — PREDNISONE 10 MG PO TABS: 20.0000 mg | ORAL_TABLET | Freq: Every day | ORAL | Status: DC

## 2016-04-25 MED FILL — predniSONE 10 MG TABS: 10 | 7 days supply | Qty: 15 | Fill #0

## 2016-04-25 NOTE — ED Notes (Signed)
The patient presented to the [facility] with a complaint of a possible insect bite on her upper right arm that occurred yesterday.

## 2016-04-25 NOTE — ED Provider Notes (Signed)
CSN: [id number]     Arrival date & time 04/25/16  1232 History   First MD Initiated Contact with Patient 04/25/16 1400     Chief Complaint  Patient presents with  . Insect Bite   (Consider location/radiation/quality/duration/timing/severity/associated sxs/prior Treatment) HPI History obtained from patient: Location: Right arm and left back  Context/Duration: Insect bite about 2 days ago, sudden onset.   Severity: No pain   Quality:Itching Timing:           constant Home Treatment: benadryl cream helps for a short time, but then gets bad again.  Associated symptoms:  none Family History:HTN, DM    Past Medical History  Diagnosis Date  . Hypertension   . Anemia   . Migraines   . Obesity   . Arthritis    Past Surgical History  Procedure Laterality Date  . Cervix lesion destruction    . Tubal ligation    . Tubal ligation    . Cardiac catheterization N/A 08/02/2015    Procedure: Left Heart Cath and Coronary Angiography;  Surgeon: Troy Sine, MD;  Location: [facility];  Service: Cardiovascular;  Laterality: N/A;   Family History  Problem Relation Age of Onset  . Hypertension Mother   . Schizophrenia Mother   . Schizophrenia Sister   . Schizophrenia Brother   . Schizophrenia Brother    Social History  Substance Use Topics  . Smoking status: Former Smoker    Quit date: 12/15/2003  . Smokeless tobacco: Never Used  . Alcohol Use: 0.6 oz/week    1 Glasses of wine per week   OB History    No data available     Review of Systems  Denies: HEADACHE, NAUSEA, ABDOMINAL PAIN, CHEST PAIN, CONGESTION, DYSURIA, SHORTNESS OF BREATH  Allergies  Review of patient's allergies indicates no known allergies.  Home Medications   Prior to Admission medications   Medication Sig Start Date End Date Taking? Authorizing Provider  albuterol (PROVENTIL HFA;VENTOLIN HFA) 108 (90 Base) MCG/ACT inhaler Inhale 1 puff into the lungs every 6 (six) hours as needed for wheezing or  shortness of breath. 01/23/16  Yes Arnoldo Morale, MD  amLODipine (NORVASC) 5 MG tablet Take 1 tablet (5 mg total) by mouth daily. 01/23/16  Yes Arnoldo Morale, MD  aspirin 325 MG tablet Take 325 mg by mouth daily.    Yes Historical Provider, MD  BIOTIN PO Take 2 tablets by mouth daily.    Yes Historical Provider, MD  cetirizine (ZYRTEC) 10 MG tablet Take 10 mg by mouth daily.   Yes Historical Provider, MD  diphenhydrAMINE (BENADRYL) 2 % cream Apply 1 application topically 3 (three) times daily as needed for itching.   Yes Historical Provider, MD  diphenhydrAMINE (BENADRYL) 25 mg capsule Take 25 mg by mouth every 6 (six) hours as needed for itching. Reported on 01/23/2016   Yes Historical Provider, MD  docusate sodium (COLACE) 100 MG capsule Take 100 mg by mouth 2 (two) times daily.   Yes Historical Provider, MD  fluticasone (FLONASE) 50 MCG/ACT nasal spray Place 2 sprays into both nostrils daily. 09/21/15  Yes Arnoldo Morale, MD  lisinopril (PRINIVIL,ZESTRIL) 20 MG tablet Take 1 tablet (20 mg total) by mouth daily. 01/23/16  Yes Arnoldo Morale, MD  meclizine (ANTIVERT) 25 MG tablet Take 1 tablet (25 mg total) by mouth 3 (three) times daily as needed. Patient taking differently: Take 25 mg by mouth 3 (three) times daily as needed for dizziness.  09/21/15  Yes Arnoldo Morale,  MD  Multiple Vitamins-Minerals (MULTIVITAMIN ADULT PO) Take 1 tablet by mouth daily.    Yes Historical Provider, MD  norgestimate-ethinyl estradiol (ORTHO-CYCLEN,SPRINTEC,PREVIFEM) 0.25-35 MG-MCG tablet Take 1 tablet by mouth daily. 10/19/15  Yes Arnoldo Morale, MD  SUMAtriptan (IMITREX) 50 MG tablet Take 2 tablets (100 mg total) by mouth every 2 (two) hours as needed for migraine. May repeat in 2 hours if headache persists, max 200mg /day 01/23/16  Yes Arnoldo Morale, MD  nortriptyline (PAMELOR) 10 MG capsule Take 1 capsule (10 mg total) by mouth at bedtime. Patient not taking: Reported on 01/23/2016 01/16/16   Pieter Partridge, DO  predniSONE (DELTASONE) 10  MG tablet Take 2 tablets (20 mg total) by mouth daily. 04/25/16   Konrad Felix, PA   Meds Ordered and Administered this Visit  Medications - No data to display  BP 116/68 mmHg  Pulse 63  Temp(Src) 98.7 F (37.1 C) (Oral)  Resp 16  SpO2 100%  LMP 04/16/2016 (Exact Date) No data found.   Physical Exam NURSES NOTES AND VITAL SIGNS REVIEWED. CONSTITUTIONAL: Well developed, well nourished, no acute distress HEENT: normocephalic, atraumatic EYES: Conjunctiva normal NECK:normal ROM, supple, no adenopathy PULMONARY:No respiratory distress, normal effort ABDOMINAL: Soft, ND, NT BS+, No CVAT MUSCULOSKELETAL: Normal ROM of all extremities, right upper arm and left scapula, insect bite without infection, red without heat, no induration. Right arm 1 cm left scapula 1.5 cm SKIN: warm and dry without rash PSYCHIATRIC: Mood and affect, behavior are normal  ED Course  Procedures (including critical care time)  Labs Review Labs Reviewed - No data to display  Imaging Review No results found.   Visual Acuity Review  Right Eye Distance:   Left Eye Distance:   Bilateral Distance:    Right Eye Near:   Left Eye Near:    Bilateral Near:       RX for prednisone  MDM   1. Insect bite     Patient is reassured that there are no issues that require transfer to higher level of care at this time or additional tests. Patient is advised to continue home symptomatic treatment. Patient is advised that if there are new or worsening symptoms to attend the emergency department, contact primary care provider, or return to UC. Instructions of care provided discharged home in stable condition.    THIS NOTE WAS GENERATED USING A VOICE RECOGNITION SOFTWARE PROGRAM. ALL REASONABLE EFFORTS  WERE MADE TO PROOFREAD THIS DOCUMENT FOR ACCURACY.  I have verbally reviewed the discharge instructions with the patient. A printed AVS was given to the patient.  All questions were answered prior to discharge.       Konrad Felix, PA 04/25/16 1434

## 2016-04-25 NOTE — Discharge Instructions (Signed)
Insect Bite °Mosquitoes, flies, fleas, bedbugs, and other insects can bite. Insect bites are different from insect stings. The bite may be red, puffy (swollen), and itchy for 2 to 4 days. Most bites get better on their own. °HOME CARE  °· Do not scratch the bite. °· Keep the bite clean and dry. Wash the bite with soap and water every day, as told by your doctor. °· If directed, apply ice to the bite area. °¨ Put ice in a plastic bag. °¨ Place a towel between your skin and the bag. °¨ Leave the ice on for 20 minutes, 2-3 times per day. °· Follow instructions from your doctor about using medicated lotions or creams. These can help with itching. °· Apply or take over-the-counter and prescription medicines only as told by your doctor. °· If you were given an antibiotic medicine, use it as told by your doctor. Do not stop using the medicine even if your condition improves. °· Keep all follow-up visits as told by your doctor. This is important. °GET HELP IF: °· You have redness, swelling (inflammation), or pain near your bite that is getting worse. °· You have a fever. °GET HELP RIGHT AWAY IF:  °· You have joint pain.   °· You have fluid, blood, or pus coming from the bite area.   °· You have a headache. °· You have neck pain. °· You feel weaker than you normally do.   °· You have a rash.   °· You have chest pain. °· You have shortness of breath. °· You have stomach pain, feel sick to your stomach (nauseous), or throw up (vomit). °· You feel more tired or sleepy than you normally do. °  °This information is not intended to replace advice given to you by your health care provider. Make sure you discuss any questions you have with your health care provider. °  °Document Released: 10/05/2000 Document Revised: 06/29/2015 Document Reviewed: 02/23/2015 °Elsevier Interactive Patient Education ©2016 [employer] ° °

## 2016-04-26 ENCOUNTER — Ambulatory Visit: Payer: Self-pay

## 2016-05-21 ENCOUNTER — Other Ambulatory Visit: Payer: Self-pay | Admitting: Family Medicine

## 2016-05-21 DIAGNOSIS — Z3041 Encounter for surveillance of contraceptive pills: Secondary | ICD-10-CM

## 2016-05-29 MED FILL — MONO-LINYAH 28 TABLET: 0.25-35 | 28 days supply | Qty: 28 | Fill #0

## 2016-05-29 MED FILL — ?LISINOPRIL 20 MG TABLET: 20 | 30 days supply | Qty: 30 | Fill #2

## 2016-05-29 MED FILL — AMLODIPINE BESYLATE 5 MG TA: 5 | 30 days supply | Qty: 30 | Fill #2

## 2016-06-26 MED FILL — SUMATRIPTAN SUCC 50 MG TAB: 50 | 30 days supply | Qty: 9 | Fill #2

## 2016-06-26 MED FILL — MONO-LINYAH 28 TABLET: 0.25-35 | 28 days supply | Qty: 28 | Fill #1

## 2016-06-26 MED FILL — ?LISINOPRIL 20 MG TABLET: 20 | 30 days supply | Qty: 30 | Fill #3

## 2016-06-26 MED FILL — ?AMLODIPINE BESYLATE 5 MG T: 5 | 30 days supply | Qty: 30 | Fill #3

## 2016-07-23 ENCOUNTER — Other Ambulatory Visit: Payer: Self-pay | Admitting: Family Medicine

## 2016-07-23 DIAGNOSIS — I10 Essential (primary) hypertension: Secondary | ICD-10-CM

## 2016-07-23 MED FILL — AMLODIPINE BESYLATE 5 MG TA: 5 | 30 days supply | Qty: 30 | Fill #0

## 2016-07-23 MED FILL — ?LISINOPRIL 20 MG TABLET: 20 | 30 days supply | Qty: 30 | Fill #0

## 2016-07-23 MED FILL — SUMATRIPTAN SUCC 50 MG TAB: 50 | 9 days supply | Qty: 3 | Fill #3

## 2016-07-23 MED FILL — MONO-LINYAH 28 TABLET: 0.25-35 | 28 days supply | Qty: 28 | Fill #2

## 2016-07-25 ENCOUNTER — Encounter: Payer: Self-pay | Admitting: Family Medicine

## 2016-07-25 ENCOUNTER — Ambulatory Visit: Payer: PRIVATE HEALTH INSURANCE | Attending: Family Medicine | Admitting: Family Medicine

## 2016-07-25 VITALS — BP 123/77 | HR 75 | Temp 99.0°F | Ht 64.0 in | Wt 215.2 lb

## 2016-07-25 DIAGNOSIS — M1990 Unspecified osteoarthritis, unspecified site: Secondary | ICD-10-CM | POA: Insufficient documentation

## 2016-07-25 DIAGNOSIS — B373 Candidiasis of vulva and vagina: Secondary | ICD-10-CM | POA: Insufficient documentation

## 2016-07-25 DIAGNOSIS — E669 Obesity, unspecified: Secondary | ICD-10-CM | POA: Insufficient documentation

## 2016-07-25 DIAGNOSIS — G43909 Migraine, unspecified, not intractable, without status migrainosus: Secondary | ICD-10-CM | POA: Insufficient documentation

## 2016-07-25 DIAGNOSIS — Z7982 Long term (current) use of aspirin: Secondary | ICD-10-CM | POA: Insufficient documentation

## 2016-07-25 DIAGNOSIS — B3731 Acute candidiasis of vulva and vagina: Secondary | ICD-10-CM

## 2016-07-25 DIAGNOSIS — I10 Essential (primary) hypertension: Secondary | ICD-10-CM | POA: Diagnosis not present

## 2016-07-25 DIAGNOSIS — R102 Pelvic and perineal pain: Secondary | ICD-10-CM | POA: Diagnosis not present

## 2016-07-25 MED ORDER — AMLODIPINE BESYLATE 5 MG PO TABS: 5.0000 mg | ORAL_TABLET | Freq: Every day | ORAL | 5 refills | Status: DC

## 2016-07-25 MED ORDER — LISINOPRIL 20 MG PO TABS: 20.0000 mg | ORAL_TABLET | Freq: Every day | ORAL | 5 refills | Status: DC

## 2016-07-25 MED ORDER — FLUCONAZOLE 150 MG PO TABS: 150.0000 mg | ORAL_TABLET | Freq: Once | ORAL | 0 refills | Status: AC

## 2016-07-25 MED FILL — FLUCONAZOLE 150 MG TABLET: 150 | 1 days supply | Qty: 1 | Fill #0

## 2016-07-25 NOTE — Patient Instructions (Signed)
Hypertension Hypertension, commonly called high blood pressure, is when the force of blood pumping through your arteries is too strong. Your arteries are the blood vessels that carry blood from your heart throughout your body. A blood pressure reading consists of a higher number over a lower number, such as 110/72. The higher number (systolic) is the pressure inside your arteries when your heart pumps. The lower number (diastolic) is the pressure inside your arteries when your heart relaxes. Ideally you want your blood pressure below 120/80. Hypertension forces your heart to work harder to pump blood. Your arteries may become narrow or stiff. Having untreated or uncontrolled hypertension can cause heart attack, stroke, kidney disease, and other problems. RISK FACTORS Some risk factors for high blood pressure are controllable. Others are not.  Risk factors you cannot control include:   Race. You may be at higher risk if you are African American.  Age. Risk increases with age.  Gender. Men are at higher risk than women before age 45 years. After age 65, women are at higher risk than men. Risk factors you can control include:  Not getting enough exercise or physical activity.  Being overweight.  Getting too much fat, sugar, calories, or salt in your diet.  Drinking too much alcohol. SIGNS AND SYMPTOMS Hypertension does not usually cause signs or symptoms. Extremely high blood pressure (hypertensive crisis) may cause headache, anxiety, shortness of breath, and nosebleed. DIAGNOSIS To check if you have hypertension, your health care provider will measure your blood pressure while you are seated, with your arm held at the level of your heart. It should be measured at least twice using the same arm. Certain conditions can cause a difference in blood pressure between your right and left arms. A blood pressure reading that is higher than normal on one occasion does not mean that you need treatment. If  it is not clear whether you have high blood pressure, you may be asked to return on a different day to have your blood pressure checked again. Or, you may be asked to monitor your blood pressure at home for 1 or more weeks. TREATMENT Treating high blood pressure includes making lifestyle changes and possibly taking medicine. Living a healthy lifestyle can help lower high blood pressure. You may need to change some of your habits. Lifestyle changes may include:  Following the DASH diet. This diet is high in fruits, vegetables, and whole grains. It is low in salt, red meat, and added sugars.  Keep your sodium intake below 2,300 mg per day.  Getting at least 30-45 minutes of aerobic exercise at least 4 times per week.  Losing weight if necessary.  Not smoking.  Limiting alcoholic beverages.  Learning ways to reduce stress. Your health care provider may prescribe medicine if lifestyle changes are not enough to get your blood pressure under control, and if one of the following is true:  You are 18-59 years of age and your systolic blood pressure is above 140.  You are 60 years of age or older, and your systolic blood pressure is above 150.  Your diastolic blood pressure is above 90.  You have diabetes, and your systolic blood pressure is over 140 or your diastolic blood pressure is over 90.  You have kidney disease and your blood pressure is above 140/90.  You have heart disease and your blood pressure is above 140/90. Your personal target blood pressure may vary depending on your medical conditions, your age, and other factors. HOME CARE INSTRUCTIONS    Have your blood pressure rechecked as directed by your health care provider.   Take medicines only as directed by your health care provider. Follow the directions carefully. Blood pressure medicines must be taken as prescribed. The medicine does not work as well when you skip doses. Skipping doses also puts you at risk for  problems.  Do not smoke.   Monitor your blood pressure at home as directed by your health care provider. SEEK MEDICAL CARE IF:   You think you are having a reaction to medicines taken.  You have recurrent headaches or feel dizzy.  You have swelling in your ankles.  You have trouble with your vision. SEEK IMMEDIATE MEDICAL CARE IF:  You develop a severe headache or confusion.  You have unusual weakness, numbness, or feel faint.  You have severe chest or abdominal pain.  You vomit repeatedly.  You have trouble breathing. MAKE SURE YOU:   Understand these instructions.  Will watch your condition.  Will get help right away if you are not doing well or get worse.   This information is not intended to replace advice given to you by your health care provider. Make sure you discuss any questions you have with your health care provider.   Document Released: 10/08/2005 Document Revised: 02/22/2015 Document Reviewed: 07/31/2013 Elsevier Interactive Patient Education 2016 Elsevier Inc.  

## 2016-07-25 NOTE — Progress Notes (Signed)
Subjective:  Patient ID: Barbara Dunn, female    DOB: Feb 04, 1971  Age: 45 y.o. MRN: [id number]  CC: Vaginal Pain (discharge)   HPI Eztli Deso is a 45 year old female with a history of hypertension who comes into the clinic complaining of "pressure down there" but denies any pain. She is unsure of the duration and denies dysuria, urinary incontinence. She has noticed minimal vaginal discharge but states that this is normal. Denies abdominal pain, nausea or vomiting. Pelvic ultrasound from 02/2015 revealed mildly thickened endometrial complex without focal abnormality. Last Pap smear in 09/2015 was normal.  Would like to have a refill of her medication.  Past Medical History:  Diagnosis Date  . Anemia   . Arthritis   . Hypertension   . Migraines   . Obesity     Past Surgical History:  Procedure Laterality Date  . CARDIAC CATHETERIZATION N/A 08/02/2015   Procedure: Left Heart Cath and Coronary Angiography;  Surgeon: Troy Sine, MD;  Location: [facility];  Service: Cardiovascular;  Laterality: N/A;  . CERVIX LESION DESTRUCTION    . TUBAL LIGATION    . TUBAL LIGATION      No Known Allergies   Outpatient Medications Prior to Visit  Medication Sig Dispense Refill  . albuterol (PROVENTIL HFA;VENTOLIN HFA) 108 (90 Base) MCG/ACT inhaler Inhale 1 puff into the lungs every 6 (six) hours as needed for wheezing or shortness of breath. 1 Inhaler 1  . aspirin 325 MG tablet Take 325 mg by mouth daily.     Marland Kitchen BIOTIN PO Take 2 tablets by mouth daily.     . cetirizine (ZYRTEC) 10 MG tablet Take 10 mg by mouth daily.    . diphenhydrAMINE (BENADRYL) 25 mg capsule Take 25 mg by mouth every 6 (six) hours as needed for itching. Reported on 01/23/2016    . fluticasone (FLONASE) 50 MCG/ACT nasal spray Place 2 sprays into both nostrils daily. 16 g 2  . Multiple Vitamins-Minerals (MULTIVITAMIN ADULT PO) Take 1 tablet by mouth daily.     . SUMAtriptan (IMITREX) 50 MG tablet Take 2 tablets (100  mg total) by mouth every 2 (two) hours as needed for migraine. May repeat in 2 hours if headache persists, max 200mg /day 10 tablet 2  . amLODipine (NORVASC) 5 MG tablet TAKE 1 TABLET BY MOUTH DAILY 30 tablet 0  . lisinopril (PRINIVIL,ZESTRIL) 20 MG tablet TAKE 1 TABLET BY MOUTH DAILY 30 tablet 0  . docusate sodium (COLACE) 100 MG capsule Take 100 mg by mouth 2 (two) times daily.    . meclizine (ANTIVERT) 25 MG tablet Take 1 tablet (25 mg total) by mouth 3 (three) times daily as needed. (Patient not taking: Reported on 07/25/2016) 30 tablet 3  . MONO-LINYAH 0.25-35 MG-MCG tablet TAKE 1 TABLET BY MOUTH DAILY. (Patient not taking: Reported on 07/25/2016) 28 tablet 6  . nortriptyline (PAMELOR) 10 MG capsule Take 1 capsule (10 mg total) by mouth at bedtime. (Patient not taking: Reported on 07/25/2016) 30 capsule 3  . diphenhydrAMINE (BENADRYL) 2 % cream Apply 1 application topically 3 (three) times daily as needed for itching.    . predniSONE (DELTASONE) 10 MG tablet Take 2 tablets (20 mg total) by mouth daily. 15 tablet 0   No facility-administered medications prior to visit.     ROS Review of Systems  Constitutional: Negative for activity change and appetite change.  HENT: Negative for sinus pressure and sore throat.   Respiratory: Negative for chest tightness, shortness of breath  and wheezing.   Cardiovascular: Negative for chest pain and palpitations.  Gastrointestinal: Negative for abdominal distention, abdominal pain and constipation.  Genitourinary: Positive for vaginal discharge. Negative for dysuria.       Vaginal pressure  Musculoskeletal: Negative.   Psychiatric/Behavioral: Negative for behavioral problems and dysphoric mood.    Objective:  BP 123/77 (BP Location: Right Arm, Patient Position: Sitting, Cuff Size: Large)   Pulse 75   Temp 99 F (37.2 C) (Oral)   Ht 5\' 4"  (1.626 m)   Wt 215 lb 3.2 oz (97.6 kg)   LMP 06/30/2016 (Approximate) Comment: on BC Pill  SpO2 100%   BMI  36.94 kg/m   BP/Weight 07/25/2016 [id number]  Systolic BP [id number]  Diastolic BP 77 68 80  Wt. (Lbs) 215.2 - 214.2  BMI 36.94 - 37.95      Physical Exam  Constitutional: She is oriented to person, place, and time. She appears well-developed and well-nourished.  Cardiovascular: Normal rate, normal heart sounds and intact distal pulses.   No murmur heard. Pulmonary/Chest: Effort normal and breath sounds normal. She has no wheezes. She has no rales. She exhibits no tenderness.  Abdominal: Soft. Bowel sounds are normal. She exhibits no distension and no mass. There is no tenderness.  Genitourinary:  Genitourinary Comments: Normal external genitalia, thick, curdy vaginal discharge, normal cervix  Musculoskeletal: Normal range of motion.  Neurological: She is alert and oriented to person, place, and time.     Assessment & Plan:   1. Essential hypertension Controlled - amLODipine (NORVASC) 5 MG tablet; Take 1 tablet (5 mg total) by mouth daily.  Dispense: 30 tablet; Refill: 5 - lisinopril (PRINIVIL,ZESTRIL) 20 MG tablet; Take 1 tablet (20 mg total) by mouth daily.  Dispense: 30 tablet; Refill: 5 - COMPLETE METABOLIC PANEL WITH GFR; Future - Lipid panel; Future  2. Pelvic pressure in female Pelvic exam is negative for prolapse Will need to exclude fibroids - US Pelvis Complete; Future - US Transvaginal Non-OB; Future  3. Vaginal candidiasis - fluconazole (DIFLUCAN) 150 MG tablet; Take 1 tablet (150 mg total) by mouth once.  Dispense: 1 tablet; Refill: 0 - Cervicovaginal ancillary only   Meds ordered this encounter  Medications  . amLODipine (NORVASC) 5 MG tablet    Sig: Take 1 tablet (5 mg total) by mouth daily.    Dispense:  30 tablet    Refill:  5  . lisinopril (PRINIVIL,ZESTRIL) 20 MG tablet    Sig: Take 1 tablet (20 mg total) by mouth daily.    Dispense:  30 tablet    Refill:  5  . fluconazole (DIFLUCAN) 150 MG tablet    Sig: Take 1 tablet (150 mg total)  by mouth once.    Dispense:  1 tablet    Refill:  0    Follow-up: Return in about 1 month (around 08/25/2016) for Follow-up on pelvic pressure.   Arnoldo Morale MD

## 2016-07-26 LAB — CERVICOVAGINAL ANCILLARY ONLY
Chlamydia: NEGATIVE
NEISSERIA GONORRHEA: NEGATIVE
WET PREP (BD AFFIRM): NEGATIVE

## 2016-07-27 ENCOUNTER — Ambulatory Visit: Payer: Self-pay | Attending: Family Medicine

## 2016-07-27 DIAGNOSIS — I10 Essential (primary) hypertension: Secondary | ICD-10-CM | POA: Insufficient documentation

## 2016-07-27 LAB — COMPLETE METABOLIC PANEL WITH GFR
ALBUMIN: 3.7 g/dL (ref 3.6–5.1)
ALT: 12 U/L (ref 6–29)
AST: 18 U/L (ref 10–30)
Alkaline Phosphatase: 36 U/L (ref 33–115)
BILIRUBIN TOTAL: 0.4 mg/dL (ref 0.2–1.2)
BUN: 11 mg/dL (ref 7–25)
CALCIUM: 8.9 mg/dL (ref 8.6–10.2)
CHLORIDE: 104 mmol/L (ref 98–110)
CO2: 25 mmol/L (ref 20–31)
CREATININE: 0.96 mg/dL (ref 0.50–1.10)
GFR, Est African American: 84 mL/min (ref >=60)
GFR, Est Non African American: 73 mL/min (ref >=60)
Glucose, Bld: 80 mg/dL (ref 65–99)
Potassium: 3.9 mmol/L (ref 3.5–5.3)
Sodium: 139 mmol/L (ref 135–146)
TOTAL PROTEIN: 6.5 g/dL (ref 6.1–8.1)

## 2016-07-27 LAB — LIPID PANEL
CHOLESTEROL: 174 mg/dL (ref 125–200)
HDL: 98 mg/dL (ref >=46)
LDL Cholesterol: 62 mg/dL (ref <130)
Total CHOL/HDL Ratio: 1.8 Ratio (ref <=5.0)
Triglycerides: 69 mg/dL (ref <150)
VLDL: 14 mg/dL (ref <30)

## 2016-07-27 NOTE — Progress Notes (Signed)
Patient here for lab visit only 

## 2016-07-30 ENCOUNTER — Telehealth: Payer: Self-pay

## 2016-07-30 DIAGNOSIS — G43009 Migraine without aura, not intractable, without status migrainosus: Secondary | ICD-10-CM

## 2016-07-30 NOTE — Telephone Encounter (Signed)
-----   Message from Boykin Nearing, MD sent at 07/30/2016  8:56 AM EDT ----- Negative screening GC/chlam Negative wet prep

## 2016-07-30 NOTE — Telephone Encounter (Signed)
Patient is requesting refill on imitrex, she thought MD refilled it for her when she was in on 07/25/16.  Pharmacy states they did not receive anything. Patient aware of all lab results.

## 2016-07-30 NOTE — Telephone Encounter (Signed)
Called patient per Dr. Adrian Blackwater, patient's test results were all normal.  Patient stated understanding.

## 2016-07-30 NOTE — Telephone Encounter (Signed)
-----   Message from Boykin Nearing, MD sent at 07/30/2016  9:10 AM EDT ----- Normal CMP and normal lipid panel

## 2016-07-30 NOTE — Telephone Encounter (Signed)
Patient requesting refill on imitrex.  Patient thought that MD refilled it when she was in on 07/25/16

## 2016-07-31 MED ORDER — SUMATRIPTAN SUCCINATE 50 MG PO TABS: 100.0000 mg | ORAL_TABLET | Freq: Once | ORAL | 2 refills | Status: DC

## 2016-07-31 NOTE — Addendum Note (Signed)
Addended by: Arnoldo Morale on: 07/31/2016 04:39 PM   Modules accepted: Orders

## 2016-07-31 NOTE — Telephone Encounter (Signed)
Done

## 2016-08-01 ENCOUNTER — Ambulatory Visit (HOSPITAL_COMMUNITY)
Admission: RE | Admit: 2016-08-01 | Discharge: 2016-08-01 | Disposition: A | Discharge status: Home / Self Care | Payer: No Typology Code available for payment source | Source: Ambulatory Visit | Attending: Family Medicine | Admitting: Family Medicine

## 2016-08-01 DIAGNOSIS — N8302 Follicular cyst of left ovary: Secondary | ICD-10-CM | POA: Insufficient documentation

## 2016-08-01 DIAGNOSIS — R102 Pelvic and perineal pain: Secondary | ICD-10-CM | POA: Diagnosis present

## 2016-08-01 NOTE — Telephone Encounter (Signed)
Patient notified that imitrex has been refilled and is waiting for her at [facility].

## 2016-08-03 ENCOUNTER — Telehealth: Payer: Self-pay | Admitting: Family Medicine

## 2016-08-03 ENCOUNTER — Telehealth: Payer: Self-pay

## 2016-08-03 NOTE — Telephone Encounter (Signed)
Pt returning call to discuss lab results

## 2016-08-03 NOTE — Telephone Encounter (Signed)
Patient called to get lab results. Please follow up.

## 2016-08-03 NOTE — Telephone Encounter (Signed)
-----   Message from Arnoldo Morale, MD sent at 08/02/2016  9:48 AM EDT ----- Pelvic and vaginal ultrasound and negative for uterine fibroids and do not explain pressure-like symptoms she complained of.

## 2016-08-03 NOTE — Telephone Encounter (Signed)
Writer per Dr. Jarold Song gave patient the results of her pelvic and vaginal [place].  Patient stated understanding.

## 2016-08-03 NOTE — Telephone Encounter (Signed)
Called and LVM with patient to call back to discuss results of [place].

## 2016-08-20 MED FILL — SUMATRIPTAN SUCC 50 MG TAB: 50 | 23 days supply | Qty: 9 | Fill #0

## 2016-08-20 MED FILL — ?AMLODIPINE BESYLATE 5 MG T: 5 | 30 days supply | Qty: 30 | Fill #0

## 2016-08-20 MED FILL — MONO-LINYAH 28 TABLET: 0.25-35 | 28 days supply | Qty: 28 | Fill #3

## 2016-08-20 MED FILL — LISINOPRIL 20 MG TABLET: 20 | 30 days supply | Qty: 30 | Fill #0

## 2016-09-17 MED FILL — ?AMLODIPINE BESYLATE 5 MG T: 5 | 30 days supply | Qty: 30 | Fill #1

## 2016-09-17 MED FILL — MONO-LINYAH 28 TABLET: 0.25-35 | 28 days supply | Qty: 28 | Fill #4

## 2016-09-17 MED FILL — ?LISINOPRIL 20 MG TABLET: 20 | 30 days supply | Qty: 30 | Fill #1

## 2016-09-18 ENCOUNTER — Other Ambulatory Visit: Payer: Self-pay | Admitting: Family Medicine

## 2016-09-18 MED FILL — FLUTICASONE PROP 50 MCG SPR: 50 | 30 days supply | Qty: 16 | Fill #0

## 2016-09-25 ENCOUNTER — Ambulatory Visit (HOSPITAL_COMMUNITY)
Admission: EM | Admit: 2016-09-25 | Discharge: 2016-09-25 | Disposition: A | Discharge status: Home / Self Care | Payer: No Typology Code available for payment source | Attending: Family Medicine | Admitting: Family Medicine

## 2016-09-25 ENCOUNTER — Ambulatory Visit (INDEPENDENT_AMBULATORY_CARE_PROVIDER_SITE_OTHER): Payer: No Typology Code available for payment source

## 2016-09-25 ENCOUNTER — Encounter (HOSPITAL_COMMUNITY): Payer: Self-pay | Admitting: Emergency Medicine

## 2016-09-25 DIAGNOSIS — J209 Acute bronchitis, unspecified: Secondary | ICD-10-CM | POA: Diagnosis not present

## 2016-09-25 MED ORDER — DOXYCYCLINE HYCLATE 100 MG PO CAPS: 100.0000 mg | ORAL_CAPSULE | Freq: Two times a day (BID) | ORAL | 0 refills | Status: DC

## 2016-09-25 MED ORDER — GUAIFENESIN-CODEINE 100-10 MG/5ML PO SYRP
10.0000 mL | ORAL_SOLUTION | Freq: Four times a day (QID) | ORAL | 0 refills | Status: DC | PRN
Start: 2016-09-25 — End: 2017-01-02

## 2016-09-25 MED FILL — DOXYCYCLINE MONO 100 MG CAP: 100 | 10 days supply | Qty: 20 | Fill #0

## 2016-09-25 MED FILL — GUAIFENESIN AC COUGH SYRUP: 100-10 | 5 days supply | Qty: 180 | Fill #0

## 2016-09-25 NOTE — ED Provider Notes (Signed)
[facility]    CSN: [id number] Arrival date & time: 09/25/16  1531     History   Chief Complaint Chief Complaint  Patient presents with  . Cough    HPI Ameilia Hirakawa is a 45 y.o. female.   The history is provided by the patient.  Cough  Cough characteristics:  Productive Sputum characteristics:  Yellow Severity:  Mild Onset quality:  Gradual Duration:  1 week Progression:  Unchanged Chronicity:  New Smoker: no   Context: upper respiratory infection   Relieved by:  Nothing Worsened by:  Nothing Ineffective treatments:  Home nebulizer and cough suppressants Associated symptoms: no chills, no fever, no rhinorrhea and no shortness of breath     Past Medical History:  Diagnosis Date  . Anemia   . Arthritis   . Hypertension   . Migraines   . Obesity     Patient Active Problem List   Diagnosis Date Noted  . Seasonal allergies 01/23/2016  . Oral contraceptive use 10/19/2015  . Pain in the chest   . Palpitations   . Abnormal nuclear stress test   . Dizziness and giddiness 05/30/2015  . Chest pain 05/30/2015  . Knee contusion 05/04/2015  . Vaginal discharge 02/12/2015  . Abnormal uterine bleeding 02/11/2015  . 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 Shortness of breath 04/20/2014  . Knee pain 04/20/2014  . 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 Candidal intertrigo 11/09/2013  . Health care maintenance 11/09/2013  . Hair loss 09/15/2013  . Pain in right shoulder 08/18/2013  . 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 Hypertension 07/13/2012  . Leg pain 03/27/2012  . Migraine without aura 01/17/2007  . OBESITY, NOS 12/19/2006    Past Surgical History:  Procedure Laterality Date  . CARDIAC CATHETERIZATION N/A 08/02/2015   Procedure: Left Heart Cath and Coronary Angiography;  Surgeon: Troy Sine, MD;  Location: [facility];  Service: Cardiovascular;  Laterality: N/A;  . CERVIX LESION DESTRUCTION    . TUBAL LIGATION    . TUBAL LIGATION       OB History    No data available       Home Medications    Prior to Admission medications   Medication Sig Start Date End Date Taking? Authorizing Provider  albuterol (PROVENTIL HFA;VENTOLIN HFA) 108 (90 Base) MCG/ACT inhaler Inhale 1 puff into the lungs every 6 (six) hours as needed for wheezing or shortness of breath. 01/23/16  Yes Arnoldo Morale, MD  amLODipine (NORVASC) 5 MG tablet Take 1 tablet (5 mg total) by mouth daily. 07/25/16  Yes Arnoldo Morale, MD  aspirin 325 MG tablet Take 325 mg by mouth daily.    Yes Historical Provider, MD  BIOTIN PO Take 2 tablets by mouth daily.    Yes Historical Provider, MD  cetirizine (ZYRTEC) 10 MG tablet Take 10 mg by mouth daily.   Yes Historical Provider, MD  diphenhydrAMINE (BENADRYL) 25 mg capsule Take 25 mg by mouth every 6 (six) hours as needed for itching. Reported on 01/23/2016   Yes Historical Provider, MD  docusate sodium (COLACE) 100 MG capsule Take 100 mg by mouth 2 (two) times daily.   Yes Historical Provider, MD  fluticasone (FLONASE) 50 MCG/ACT nasal spray PLACE 2 SPRAYS INTO BOTH NOSTRILS DAILY. 09/18/16  Yes Arnoldo Morale, MD  lisinopril (PRINIVIL,ZESTRIL) 20 MG tablet Take 1 tablet (20 mg total) by mouth daily. 07/25/16  Yes Arnoldo Morale, MD  MONO-LINYAH 0.25-35 MG-MCG tablet TAKE 1 TABLET BY MOUTH DAILY. 05/21/16  Yes Arnoldo Morale, MD  Multiple Vitamins-Minerals (MULTIVITAMIN ADULT PO) Take 1 tablet by mouth daily.    Yes Historical Provider, MD  meclizine (ANTIVERT) 25 MG tablet Take 1 tablet (25 mg total) by mouth 3 (three) times daily as needed. Patient not taking: Reported on 07/25/2016 09/21/15   Arnoldo Morale, MD  nortriptyline (PAMELOR) 10 MG capsule Take 1 capsule (10 mg total) by mouth at bedtime. Patient not taking: Reported on 09/25/2016 01/16/16   Pieter Partridge, DO  SUMAtriptan (IMITREX) 50 MG tablet Take 2 tablets (100 mg total) by mouth once. May repeat in 2 hours if headache persists, max 200mg /day 07/31/16 07/31/16  Arnoldo Morale, MD    Family History Family History  Problem Relation Age of Onset  . Hypertension Mother   . Schizophrenia Mother   . Schizophrenia Sister   . Schizophrenia Brother   . Schizophrenia Brother     Social History Social History  Substance Use Topics  . Smoking status: Former Smoker    Quit date: 12/15/2003  . Smokeless tobacco: Never Used     Comment: quit 10 years ago  . Alcohol use 0.6 oz/week    1 Glasses of wine per week     Comment: socially     Allergies   Patient has no known allergies.   Review of Systems Review of Systems  Constitutional: Negative for chills and fever.  HENT: Negative for rhinorrhea.   Respiratory: Positive for cough. Negative for shortness of breath.      Physical Exam Triage Vital Signs ED Triage Vitals [09/25/16 1552]  Enc Vitals Group     BP 129/84     Pulse Rate 89     Resp      Temp 98.8 F (37.1 C)     Temp Source Oral     SpO2 97 %     Weight      Height      Head Circumference      Peak Flow      Pain Score 0     Pain Loc      Pain Edu?      Excl. in [place]?    No data found.   Updated Vital Signs BP 129/84 (BP Location: Right Arm)   Pulse 89   Temp 98.8 F (37.1 C) (Oral)   LMP 09/18/2016 (Approximate)   SpO2 97%   Visual Acuity Right Eye Distance:   Left Eye Distance:   Bilateral Distance:    Right Eye Near:   Left Eye Near:    Bilateral Near:     Physical Exam  Constitutional: She is oriented to person, place, and time. She appears well-developed and well-nourished. No distress.  HENT:  Mouth/Throat: Oropharynx is clear and moist.  Neck: Normal range of motion. Neck supple.  Pulmonary/Chest: Effort normal and breath sounds normal.  Abdominal: Soft. Bowel sounds are normal.  Lymphadenopathy:    She has no cervical adenopathy.  Neurological: She is alert and oriented to person, place, and time.  Skin: Skin is warm and dry.  Nursing note and vitals reviewed.    UC Treatments / Results   Labs (all labs ordered are listed, but only abnormal results are displayed) Labs Reviewed - No data to display  EKG  EKG Interpretation None       Radiology No results found.  Procedures Procedures (including critical care time)  Medications Ordered in UC Medications - No data to display   Initial Impression / Assessment and  Plan / UC Course  I have reviewed the triage vital signs and the nursing notes.  Pertinent labs & imaging results that were available during my care of the patient were reviewed by me and considered in my medical decision making (see chart for details).  Clinical Course       Final Clinical Impressions(s) / UC Diagnoses   Final diagnoses:  None    New Prescriptions New Prescriptions   No medications on file     Billy Fischer, MD 09/25/16 1656

## 2016-09-25 NOTE — ED Triage Notes (Signed)
Pt has had a cough x1 week.  She has been using an inhaler, nasal spray, cough drops, and cough medications with no relief.  She denies any fever.

## 2016-09-25 NOTE — Discharge Instructions (Signed)
Take all of medicine, drink lots of fluids, see your doctor if further problems °

## 2016-10-17 MED FILL — AMLODIPINE BESYLATE 5 MG TA: 5 | 30 days supply | Qty: 30 | Fill #2

## 2016-10-17 MED FILL — MONO-LINYAH 28 TABLET: 0.25-35 | 28 days supply | Qty: 28 | Fill #5

## 2016-10-17 MED FILL — SUMATRIPTAN SUCC 50 MG TAB: 50 | 23 days supply | Qty: 9 | Fill #1

## 2016-10-17 MED FILL — FLUTICASONE PROP 50 MCG SPR: 50 | 30 days supply | Qty: 16 | Fill #1

## 2016-10-17 MED FILL — ?LISINOPRIL 20 MG TABLET: 20 | 30 days supply | Qty: 30 | Fill #2

## 2016-11-12 MED FILL — SUMATRIPTAN SUCC 50 MG TAB: 50 | 23 days supply | Qty: 9 | Fill #2

## 2016-11-12 MED FILL — MONO-LINYAH 28 TABLET: 0.25-35 | 28 days supply | Qty: 28 | Fill #6

## 2016-11-12 MED FILL — ?LISINOPRIL 20 MG TABLET: 20 | 30 days supply | Qty: 30 | Fill #3

## 2016-11-12 MED FILL — VENTOLIN HFA 90 MCG INHALER: 108 (90 BAS | 30 days supply | Qty: 18 | Fill #1

## 2016-11-12 MED FILL — AMLODIPINE BESYLATE 5 MG TA: 5 | 30 days supply | Qty: 30 | Fill #3

## 2016-12-12 ENCOUNTER — Other Ambulatory Visit: Payer: Self-pay | Admitting: Family Medicine

## 2016-12-12 DIAGNOSIS — Z3041 Encounter for surveillance of contraceptive pills: Secondary | ICD-10-CM

## 2016-12-12 MED FILL — ?LISINOPRIL 20 MG TABLET: 20 | 30 days supply | Qty: 30 | Fill #4

## 2016-12-12 MED FILL — AMLODIPINE BESYLATE 5 MG TA: 5 | 30 days supply | Qty: 30 | Fill #4

## 2016-12-14 ENCOUNTER — Other Ambulatory Visit: Payer: Self-pay | Admitting: Pharmacist

## 2016-12-14 DIAGNOSIS — Z3041 Encounter for surveillance of contraceptive pills: Secondary | ICD-10-CM

## 2016-12-14 MED ORDER — NORGESTIMATE-ETH ESTRADIOL 0.25-35 MG-MCG PO TABS: 1.0000 | ORAL_TABLET | Freq: Every day | ORAL | 2 refills | Status: DC

## 2016-12-14 MED FILL — MONO-LINYAH 28 TABLET: 0.25-35 | 28 days supply | Qty: 28 | Fill #0

## 2016-12-26 ENCOUNTER — Ambulatory Visit: Payer: PRIVATE HEALTH INSURANCE | Admitting: Family Medicine

## 2017-01-02 ENCOUNTER — Ambulatory Visit: Payer: Self-pay | Attending: Family Medicine | Admitting: Family Medicine

## 2017-01-02 ENCOUNTER — Encounter: Payer: Self-pay | Admitting: Family Medicine

## 2017-01-02 VITALS — BP 123/79 | HR 82 | Temp 98.5°F | Ht 63.0 in | Wt 215.0 lb

## 2017-01-02 DIAGNOSIS — I10 Essential (primary) hypertension: Secondary | ICD-10-CM | POA: Insufficient documentation

## 2017-01-02 DIAGNOSIS — L304 Erythema intertrigo: Secondary | ICD-10-CM | POA: Insufficient documentation

## 2017-01-02 DIAGNOSIS — Z9889 Other specified postprocedural states: Secondary | ICD-10-CM | POA: Insufficient documentation

## 2017-01-02 DIAGNOSIS — Z7982 Long term (current) use of aspirin: Secondary | ICD-10-CM | POA: Insufficient documentation

## 2017-01-02 DIAGNOSIS — Z79899 Other long term (current) drug therapy: Secondary | ICD-10-CM | POA: Insufficient documentation

## 2017-01-02 DIAGNOSIS — N939 Abnormal uterine and vaginal bleeding, unspecified: Secondary | ICD-10-CM | POA: Insufficient documentation

## 2017-01-02 DIAGNOSIS — E669 Obesity, unspecified: Secondary | ICD-10-CM | POA: Insufficient documentation

## 2017-01-02 DIAGNOSIS — M545 Low back pain, unspecified: Secondary | ICD-10-CM

## 2017-01-02 DIAGNOSIS — R634 Abnormal weight loss: Secondary | ICD-10-CM | POA: Insufficient documentation

## 2017-01-02 DIAGNOSIS — G43009 Migraine without aura, not intractable, without status migrainosus: Secondary | ICD-10-CM | POA: Insufficient documentation

## 2017-01-02 LAB — COMPLETE METABOLIC PANEL WITH GFR
ALT: 13 U/L (ref 6–29)
AST: 17 U/L (ref 10–30)
Albumin: 3.7 g/dL (ref 3.6–5.1)
Alkaline Phosphatase: 39 U/L (ref 33–115)
BILIRUBIN TOTAL: 0.3 mg/dL (ref 0.2–1.2)
BUN: 11 mg/dL (ref 7–25)
CO2: 28 mmol/L (ref 20–31)
CREATININE: 0.86 mg/dL (ref 0.50–1.10)
Calcium: 9 mg/dL (ref 8.6–10.2)
Chloride: 106 mmol/L (ref 98–110)
GFR, Est Non African American: 82 mL/min (ref >=60)
Glucose, Bld: 64 mg/dL — ABNORMAL LOW (ref 65–99)
Potassium: 3.8 mmol/L (ref 3.5–5.3)
Sodium: 141 mmol/L (ref 135–146)
TOTAL PROTEIN: 6.7 g/dL (ref 6.1–8.1)

## 2017-01-02 MED ORDER — NYSTATIN 100000 UNIT/GM EX POWD: Freq: Two times a day (BID) | CUTANEOUS | 1 refills | Status: DC

## 2017-01-02 MED ORDER — LISINOPRIL 20 MG PO TABS: 20.0000 mg | ORAL_TABLET | Freq: Every day | ORAL | 5 refills | Status: DC

## 2017-01-02 MED ORDER — SUMATRIPTAN SUCCINATE 50 MG PO TABS: 100.0000 mg | ORAL_TABLET | Freq: Once | ORAL | 2 refills | Status: DC

## 2017-01-02 MED ORDER — AMLODIPINE BESYLATE 5 MG PO TABS: 5.0000 mg | ORAL_TABLET | Freq: Every day | ORAL | 5 refills | Status: DC

## 2017-01-02 MED FILL — NYSTOP 100,000 UNITS/GM PWD: 100000 | 30 days supply | Qty: 45 | Fill #0

## 2017-01-02 MED FILL — SUMATRIPTAN SUCC 50 MG TAB: 50 | 30 days supply | Qty: 9 | Fill #0

## 2017-01-02 NOTE — Patient Instructions (Signed)
Intertrigo Intertrigo is skin irritation or inflammation (dermatitis) that occurs when folds of skin rub together. The irritation can cause a rash and make skin raw and itchy. This condition most commonly occurs in the skin folds of these areas:  Toes.  Armpits.  Groin.  Belly.  Breasts.  Buttocks. Intertrigo is not passed from person to person (is not contagious). What are the causes? This condition is caused by heat, moisture, friction, and lack of air circulation. The condition can be made worse by:  Sweat.  Bacteria or a fungus, such as yeast. What increases the risk? This condition is more likely to occur if you have moisture in your skin folds. It is also more likely to develop in people who:  Have diabetes.  Are overweight.  Are on bed rest.  Live in a warm and moist climate.  Wear splints, braces, or other medical devices.  Are not able to control their bowels or bladder (have incontinence). What are the signs or symptoms? Symptoms of this condition include:  A pink or red skin rash.  Brown patches on the skin.  Raw or scaly skin.  Itchiness.  A burning feeling.  Bleeding.  Leaking fluid.  A bad smell. How is this diagnosed? This condition is diagnosed with a medical history and physical exam. You may also have a skin swab to test for bacteria or a fungus, such as yeast. How is this treated? Treatment may include:  Cleaning and drying your skin.  An oral antibiotic medicine or antibiotic skin cream for a bacterial infection.  Antifungal cream or pills for an infection that was caused by a fungus, such as yeast.  Steroid ointment to relieve itchiness and irritation. Follow these instructions at home:  Keep the affected area clean and dry.  Do not scratch your skin.  Stay in a cool environment as much as possible. Use an air conditioner or fan, if available.  Apply over-the-counter and prescription medicines only as told by your health  care provider.  If you were prescribed an antibiotic medicine, use it as told by your health care provider. Do not stop using the antibiotic even if your condition improves.  Keep all follow-up visits as told by your health care provider. This is important. How is this prevented?  Maintain a healthy weight.  Take care of your feet, especially if you have diabetes. Foot care includes:  Wearing shoes that fit well.  Keeping your feet dry.  Wearing clean, breathable socks.  Protect the skin around your groin and buttocks, especially if you have incontinence. Skin protection includes:  Following a regular cleaning routine.  Using moisturizers and skin protectants.  Changing protection pads frequently.  Do not wear tight clothes. Wear clothes that are loose and absorbent. Wear clothes that are made of cotton.  Wear a bra that gives good support, if needed.  Shower and dry yourself thoroughly after activity. Use a hair dryer on a cool setting to dry between skin folds, especially after you bathe.  If you have diabetes, keep your blood sugar under control. Contact a health care provider if:  Your symptoms do not improve with treatment.  Your symptoms get worse or they spread.  You notice increased redness and warmth.  You have a fever. This information is not intended to replace advice given to you by your health care provider. Make sure you discuss any questions you have with your health care provider. Document Released: 10/08/2005 Document Revised: 03/15/2016 Document Reviewed: 04/11/2015 Elsevier Interactive   Patient Education  2017 [employer]vier Inc.  

## 2017-01-02 NOTE — Progress Notes (Signed)
Medication refills
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
## 2017-01-07 MED FILL — MONO-LINYAH 28 TABLET: 0.25-35 | 28 days supply | Qty: 28 | Fill #1

## 2017-01-21 ENCOUNTER — Telehealth: Payer: Self-pay

## 2017-01-21 NOTE — Telephone Encounter (Signed)
-----   Message from Arnoldo Morale, MD sent at 01/03/2017  1:17 PM EDT ----- Please inform the patient that labs are normal. Thank you.

## 2017-01-21 NOTE — Telephone Encounter (Signed)
Writer called patient to discuss result lab results.  Patient stated understanding.

## 2017-01-22 MED FILL — FLUTICASONE PROP 50 MCG SPR: 50 | 30 days supply | Qty: 16 | Fill #2

## 2017-01-22 MED FILL — ?LISINOPRIL 20 MG TABLET: 20 | 30 days supply | Qty: 30 | Fill #0

## 2017-01-22 MED FILL — ?AMLODIPINE BESYLATE 5 MG T: 5 | 30 days supply | Qty: 30 | Fill #0

## 2017-02-04 MED FILL — MONO-LINYAH 28 TABLET: 0.25-35 | 28 days supply | Qty: 28 | Fill #2

## 2017-02-20 MED FILL — ?AMLODIPINE BESYLATE 5 MG T: 5 | 30 days supply | Qty: 30 | Fill #1

## 2017-02-20 MED FILL — ?LISINOPRIL 20 MG TABLET: 20 | 30 days supply | Qty: 30 | Fill #1

## 2017-03-05 ENCOUNTER — Other Ambulatory Visit: Payer: Self-pay | Admitting: Family Medicine

## 2017-03-05 ENCOUNTER — Telehealth: Payer: Self-pay | Admitting: Family Medicine

## 2017-03-05 DIAGNOSIS — Z3041 Encounter for surveillance of contraceptive pills: Secondary | ICD-10-CM

## 2017-03-05 MED ORDER — NORGESTIMATE-ETH ESTRADIOL 0.25-35 MG-MCG PO TABS: 1.0000 | ORAL_TABLET | Freq: Every day | ORAL | 11 refills | Status: DC

## 2017-03-05 MED FILL — MONO-LINYAH 28 TABLET: 0.25-35 | 28 days supply | Qty: 28 | Fill #0

## 2017-03-05 NOTE — Telephone Encounter (Signed)
Pt calling to request a refill of her birth control. Would like script sent to [facility] pharmacy. Thank you.

## 2017-03-05 NOTE — Telephone Encounter (Signed)
Done

## 2017-03-19 ENCOUNTER — Other Ambulatory Visit: Payer: Self-pay | Admitting: Family Medicine

## 2017-03-19 MED FILL — FLUTICASONE PROP 50 MCG SPR: 50 | 30 days supply | Qty: 16 | Fill #0

## 2017-03-19 MED FILL — SUMATRIPTAN SUCC 50 MG TAB: 50 | 30 days supply | Qty: 9 | Fill #1

## 2017-03-19 MED FILL — ?AMLODIPINE BESYLATE 5 MG T: 5 | 30 days supply | Qty: 30 | Fill #2

## 2017-03-19 MED FILL — ?LISINOPRIL 20 MG TABLET: 20 | 30 days supply | Qty: 30 | Fill #2

## 2017-04-01 MED FILL — MONO-LINYAH 28 TABLET: 0.25-35 | 28 days supply | Qty: 28 | Fill #1

## 2017-04-04 ENCOUNTER — Encounter: Payer: Self-pay | Admitting: Family Medicine

## 2017-04-04 ENCOUNTER — Other Ambulatory Visit: Payer: Self-pay

## 2017-04-04 ENCOUNTER — Ambulatory Visit: Payer: BLUE CROSS/BLUE SHIELD | Attending: Family Medicine | Admitting: Family Medicine

## 2017-04-04 VITALS — BP 116/82 | HR 80 | Temp 98.6°F | Resp 18 | Ht 64.0 in | Wt 210.0 lb

## 2017-04-04 DIAGNOSIS — I10 Essential (primary) hypertension: Secondary | ICD-10-CM | POA: Insufficient documentation

## 2017-04-04 DIAGNOSIS — Z6836 Body mass index (BMI) 36.0-36.9, adult: Secondary | ICD-10-CM | POA: Insufficient documentation

## 2017-04-04 DIAGNOSIS — E669 Obesity, unspecified: Secondary | ICD-10-CM | POA: Diagnosis not present

## 2017-04-04 DIAGNOSIS — I499 Cardiac arrhythmia, unspecified: Secondary | ICD-10-CM | POA: Insufficient documentation

## 2017-04-04 DIAGNOSIS — Z79899 Other long term (current) drug therapy: Secondary | ICD-10-CM | POA: Insufficient documentation

## 2017-04-04 DIAGNOSIS — F419 Anxiety disorder, unspecified: Secondary | ICD-10-CM | POA: Insufficient documentation

## 2017-04-04 DIAGNOSIS — Z7982 Long term (current) use of aspirin: Secondary | ICD-10-CM | POA: Diagnosis not present

## 2017-04-04 DIAGNOSIS — G43009 Migraine without aura, not intractable, without status migrainosus: Secondary | ICD-10-CM | POA: Diagnosis not present

## 2017-04-04 MED ORDER — LISINOPRIL 20 MG PO TABS: 20.0000 mg | ORAL_TABLET | Freq: Every day | ORAL | 5 refills | Status: DC

## 2017-04-04 MED ORDER — AMLODIPINE BESYLATE 5 MG PO TABS: 5.0000 mg | ORAL_TABLET | Freq: Every day | ORAL | 5 refills | Status: DC

## 2017-04-04 MED ORDER — SUMATRIPTAN SUCCINATE 50 MG PO TABS: 100.0000 mg | ORAL_TABLET | Freq: Once | ORAL | 2 refills | Status: DC

## 2017-04-04 NOTE — Progress Notes (Signed)
Patient is here for FU  Patient denies pain at this time.  Patient has taken medication today. Patient has eaten today.   

## 2017-04-04 NOTE — Progress Notes (Signed)
Subjective:  Patient ID: Barbara Dunn, female    DOB: 1971-05-05  Age: 46 y.o. MRN: [id number]  CC: Hypertension   HPI Barbara Dunn is 46 year old female with a history of hypertension, migraine, obesity who presents today for follow-up visit.  She endorses compliance with her medications; migraines occur 2-3 times a month and are controlled on Imitrex. She tolerates her antihypertensives without any side effects.  She has noticed episodes of irregular heartbeats reorted on her home blood pressure monitor, which she uses at home; the last of this was noticed 3 days ago. This has been over the last 4-5 months. She denies palpitations or chest pains or shortness of breath. Endorses a previous history of anxiety but she did have to present to the ED with chest pain which was diagnosed as underlying anxiety. Last TSH was in 01/2015 and was normal  Past Medical History:  Diagnosis Date  . Anemia   . Arthritis   . Hypertension   . Migraines   . Obesity     Past Surgical History:  Procedure Laterality Date  . CARDIAC CATHETERIZATION N/A 08/02/2015   Procedure: Left Heart Cath and Coronary Angiography;  Surgeon: Troy Sine, MD;  Location: [facility];  Service: Cardiovascular;  Laterality: N/A;  . CERVIX LESION DESTRUCTION    . TUBAL LIGATION    . TUBAL LIGATION      No Known Allergies   Outpatient Medications Prior to Visit  Medication Sig Dispense Refill  . albuterol (PROVENTIL HFA;VENTOLIN HFA) 108 (90 Base) MCG/ACT inhaler Inhale 1 puff into the lungs every 6 (six) hours as needed for wheezing or shortness of breath. 1 Inhaler 1  . aspirin 325 MG tablet Take 325 mg by mouth daily.     Marland Kitchen BIOTIN PO Take 2 tablets by mouth daily.     . cetirizine (ZYRTEC) 10 MG tablet Take 10 mg by mouth daily.    . diphenhydrAMINE (BENADRYL) 25 mg capsule Take 25 mg by mouth every 6 (six) hours as needed for itching. Reported on 01/23/2016    . docusate sodium (COLACE) 100 MG capsule  Take 100 mg by mouth 2 (two) times daily.    . fluticasone (FLONASE) 50 MCG/ACT nasal spray PLACE 2 SPRAYS INTO BOTH NOSTRILS DAILY. 16 g 2  . Multiple Vitamins-Minerals (MULTIVITAMIN ADULT PO) Take 1 tablet by mouth daily.     . norgestimate-ethinyl estradiol (MONO-LINYAH) 0.25-35 MG-MCG tablet Take 1 tablet by mouth daily. 28 tablet 11  . nystatin (NYSTATIN) powder Apply topically 2 (two) times daily. 45 g 1  . amLODipine (NORVASC) 5 MG tablet Take 1 tablet (5 mg total) by mouth daily. 30 tablet 5  . lisinopril (PRINIVIL,ZESTRIL) 20 MG tablet Take 1 tablet (20 mg total) by mouth daily. 30 tablet 5  . nortriptyline (PAMELOR) 10 MG capsule Take 1 capsule (10 mg total) by mouth at bedtime. 30 capsule 3  . SUMAtriptan (IMITREX) 50 MG tablet Take 2 tablets (100 mg total) by mouth once. May repeat in 2 hours if headache persists, max 200mg /day 10 tablet 2   No facility-administered medications prior to visit.     ROS Review of Systems  Constitutional: Negative for activity change, appetite change and fatigue.  HENT: Negative for congestion, sinus pressure and sore throat.   Eyes: Negative for visual disturbance.  Respiratory: Negative for cough, chest tightness, shortness of breath and wheezing.   Cardiovascular: Negative for chest pain and palpitations.  Gastrointestinal: Negative for abdominal distention, abdominal pain and  constipation.  Endocrine: Negative for polydipsia.  Genitourinary: Negative for dysuria and frequency.  Musculoskeletal: Negative for arthralgias and back pain.  Skin: Negative for rash.  Neurological: Negative for tremors, light-headedness and numbness.  Hematological: Does not bruise/bleed easily.  Psychiatric/Behavioral: Negative for agitation and behavioral problems.    Objective:  BP 116/82 (BP Location: Left Arm, Patient Position: Sitting, Cuff Size: Large)   Pulse 80   Temp 98.6 F (37 C) (Oral)   Resp 18   Ht 5\' 4"  (1.626 m)   Wt 210 lb (95.3 kg)   LMP  04/04/2017   SpO2 100%   BMI 36.05 kg/m   BP/Weight 04/04/2017 01/02/2017 93/06/299  Systolic BP 923 300 762  Diastolic BP 82 79 84  Wt. (Lbs) 210 215 -  BMI 36.05 38.09 -      Physical Exam  Constitutional: She is oriented to person, place, and time. She appears well-developed and well-nourished.  Cardiovascular: Normal rate, normal heart sounds and intact distal pulses.   No murmur heard. Pulmonary/Chest: Effort normal and breath sounds normal. She has no wheezes. She has no rales. She exhibits no tenderness.  Abdominal: Soft. Bowel sounds are normal. She exhibits no distension and no mass. There is no tenderness.  Musculoskeletal: Normal range of motion.  Neurological: She is alert and oriented to person, place, and time.  Skin: Skin is warm and dry.  Psychiatric: She has a normal mood and affect.     Assessment & Plan:   1. Migraine without aura and without status migrainosus, not intractable Controlled - SUMAtriptan (IMITREX) 50 MG tablet; Take 2 tablets (100 mg total) by mouth once. May repeat in 2 hours if headache persists, max 200mg /day  Dispense: 10 tablet; Refill: 2  2. Essential hypertension Controlled Low-sodium diet - amLODipine (NORVASC) 5 MG tablet; Take 1 tablet (5 mg total) by mouth daily.  Dispense: 30 tablet; Refill: 5 - lisinopril (PRINIVIL,ZESTRIL) 20 MG tablet; Take 1 tablet (20 mg total) by mouth daily.  Dispense: 30 tablet; Refill: 5  3. Irregular heartbeat EKG revealed normal sinus rhythm Will need to exclude thyroid condition Could also be underlying anxiety If symptoms persist she will need to be evaluated for an event monitor - TSH   Meds ordered this encounter  Medications  . SUMAtriptan (IMITREX) 50 MG tablet    Sig: Take 2 tablets (100 mg total) by mouth once. May repeat in 2 hours if headache persists, max 200mg /day    Dispense:  10 tablet    Refill:  2  . amLODipine (NORVASC) 5 MG tablet    Sig: Take 1 tablet (5 mg total) by mouth  daily.    Dispense:  30 tablet    Refill:  5  . lisinopril (PRINIVIL,ZESTRIL) 20 MG tablet    Sig: Take 1 tablet (20 mg total) by mouth daily.    Dispense:  30 tablet    Refill:  5    Follow-up: Return in about 4 months (around 08/04/2017) for Follow-up on chronic medical conditions.   Arnoldo Morale MD

## 2017-04-04 NOTE — Patient Instructions (Signed)

## 2017-04-05 LAB — TSH: TSH: 0.548 u[IU]/mL (ref 0.450–4.500)

## 2017-04-15 MED FILL — ?LISINOPRIL 20 MG TABLET: 20 | 30 days supply | Qty: 30 | Fill #3

## 2017-04-15 MED FILL — ?AMLODIPINE BESYLATE 5 MG T: 5 | 30 days supply | Qty: 30 | Fill #3

## 2017-04-23 MED FILL — MONO-LINYAH 28 TABLET: 0.25-35 | 28 days supply | Qty: 28 | Fill #2

## 2017-05-11 ENCOUNTER — Ambulatory Visit (INDEPENDENT_AMBULATORY_CARE_PROVIDER_SITE_OTHER): Payer: Worker's Compensation

## 2017-05-11 ENCOUNTER — Encounter (HOSPITAL_COMMUNITY): Payer: Self-pay | Admitting: Family Medicine

## 2017-05-11 ENCOUNTER — Ambulatory Visit (HOSPITAL_COMMUNITY)
Admission: EM | Admit: 2017-05-11 | Discharge: 2017-05-11 | Disposition: A | Discharge status: Home / Self Care | Payer: Worker's Compensation | Attending: Family Medicine | Admitting: Family Medicine

## 2017-05-11 DIAGNOSIS — S62102A Fracture of unspecified carpal bone, left wrist, initial encounter for closed fracture: Secondary | ICD-10-CM | POA: Diagnosis not present

## 2017-05-11 MED ORDER — ACETAMINOPHEN-CODEINE #3 300-30 MG PO TABS: 1.0000 | ORAL_TABLET | Freq: Four times a day (QID) | ORAL | 0 refills | Status: DC | PRN

## 2017-05-11 NOTE — ED Notes (Signed)
Large  l   Wrist  Splint  applied  To l  Arm

## 2017-05-11 NOTE — ED Triage Notes (Signed)
Pt here for left shoulder pain that started Wednesday and then today she fell and injured the left wrist. Obvious swelling to wrist.

## 2017-05-11 NOTE — ED Provider Notes (Signed)
[facility]    CSN: [id number] Arrival date & time: 05/11/17  1817     History   Chief Complaint Chief Complaint  Patient presents with  . Arm Pain    HPI Barbara Dunn is a 46 y.o. female.   HPI  Patient started experiencing shoulder pain on Wednesday, today she fell and landed on her stretched hand. She hurt the back of her left wrist towards the ulnar side. It is swelling and painful. She tried taking ibuprofen at home that helped a little bit. No numbness or tingling. She is still able to move her wrist.  Past Medical History:  Diagnosis Date  . Anemia   . Arthritis   . Hypertension   . Migraines   . Obesity     Patient Active Problem List   Diagnosis Date Noted  . Seasonal allergies 01/23/2016  . Oral contraceptive use 10/19/2015  . Pain in the chest   . Palpitations   . Abnormal nuclear stress test   . Dizziness and giddiness 05/30/2015  . Chest pain 05/30/2015  . Knee contusion 05/04/2015  . Vaginal discharge 02/12/2015  . Abnormal uterine bleeding 02/11/2015  . 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 Shortness of breath 04/20/2014  . Knee pain 04/20/2014  . 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 Candidal intertrigo 11/09/2013  . Health care maintenance 11/09/2013  . Hair loss 09/15/2013  . Pain in right shoulder 08/18/2013  . 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 Hypertension 07/13/2012  . Leg pain 03/27/2012  . Migraine without aura 01/17/2007  . OBESITY, NOS 12/19/2006    Past Surgical History:  Procedure Laterality Date  . CARDIAC CATHETERIZATION N/A 08/02/2015   Procedure: Left Heart Cath and Coronary Angiography;  Surgeon: Troy Sine, MD;  Location: [place] [facility];  Service: Cardiovascular;  Laterality: N/A;  . CERVIX LESION DESTRUCTION    . TUBAL LIGATION    . TUBAL LIGATION       Home Medications    Prior to Admission medications   Medication Sig Start Date End Date Taking? Authorizing Provider  acetaminophen-codeine  (TYLENOL #3) 300-30 MG tablet Take 1-2 tablets by mouth every 6 (six) hours as needed for moderate pain. 05/11/17   Shelda Pal, DO  albuterol (PROVENTIL HFA;VENTOLIN HFA) 108 (90 Base) MCG/ACT inhaler Inhale 1 puff into the lungs every 6 (six) hours as needed for wheezing or shortness of breath. 01/23/16   Arnoldo Morale, MD  amLODipine (NORVASC) 5 MG tablet Take 1 tablet (5 mg total) by mouth daily. 04/04/17   Arnoldo Morale, MD  aspirin 325 MG tablet Take 325 mg by mouth daily.     [provider]  BIOTIN PO Take 2 tablets by mouth daily.     [provider]  cetirizine (ZYRTEC) 10 MG tablet Take 10 mg by mouth daily.    [provider]  diphenhydrAMINE (BENADRYL) 25 mg capsule Take 25 mg by mouth every 6 (six) hours as needed for itching. Reported on 01/23/2016    [provider]  docusate sodium (COLACE) 100 MG capsule Take 100 mg by mouth 2 (two) times daily.    [provider]  fluticasone (FLONASE) 50 MCG/ACT nasal spray PLACE 2 SPRAYS INTO BOTH NOSTRILS DAILY. 03/19/17   Arnoldo Morale, MD  lisinopril (PRINIVIL,ZESTRIL) 20 MG tablet Take 1 tablet (20 mg total) by mouth daily. 04/04/17   Arnoldo Morale, MD  Multiple Vitamins-Minerals (MULTIVITAMIN ADULT PO) Take 1 tablet by mouth daily.  [provider]  norgestimate-ethinyl estradiol (MONO-LINYAH) 0.25-35 MG-MCG tablet Take 1 tablet by mouth daily. 03/05/17   Arnoldo Morale, MD  nystatin (NYSTATIN) powder Apply topically 2 (two) times daily. 01/02/17   Arnoldo Morale, MD  SUMAtriptan (IMITREX) 50 MG tablet Take 2 tablets (100 mg total) by mouth once. May repeat in 2 hours if headache persists, max 200mg /day 04/04/17 04/04/17  Arnoldo Morale, MD    Family History Family History  Problem Relation Age of Onset  . Hypertension Mother   . Schizophrenia Mother   . Schizophrenia Sister   . Schizophrenia Brother   . Schizophrenia Brother     Social History Social History  Substance Use  Topics  . Smoking status: Former Smoker    Quit date: 12/15/2003  . Smokeless tobacco: Never Used     Comment: quit 10 years ago  . Alcohol use 1.2 oz/week    1 Glasses of wine, 1 Shots of liquor per week     Comment: socially     Allergies   Patient has no known allergies.   Review of Systems Review of Systems  Musculoskeletal:       +Wrist pain  Neurological: Negative for numbness.     Physical Exam Triage Vital Signs ED Triage Vitals  Enc Vitals Group     BP 05/11/17 1845 134/82     Pulse Rate 05/11/17 1845 79     Resp 05/11/17 1845 18     Temp 05/11/17 1845 98.1 F (36.7 C)     SpO2 05/11/17 1845 100 %     Pain Score 05/11/17 1844 8   Updated Vital Signs BP 134/82   Pulse 79   Temp 98.1 F (36.7 C)   Resp 18   LMP 04/27/2017 (Exact Date)   SpO2 100%    Physical Exam  Constitutional: She is oriented to person, place, and time. She appears well-developed and well-nourished.  Pulmonary/Chest: No respiratory distress.  Musculoskeletal:  Slightly decreased ROM at wrist, +edema over dorsal prox/lat hand, some ecchymosis noted, +TTP over distal ulna and carpal row (particularly over hamate)  Neurological: She is alert and oriented to person, place, and time. No sensory deficit.  Skin: Capillary refill takes less than 2 seconds.  Psychiatric: She has a normal mood and affect. Judgment normal.     UC Treatments / Results   Radiology Dg Shoulder Left  Result Date: 05/11/2017 CLINICAL DATA:  Left shoulder pain EXAM: LEFT SHOULDER - 2+ VIEW COMPARISON:  None. FINDINGS: There is no evidence of fracture or dislocation. The acromioclavicular and glenohumeral joints appear intact. The included ribs and lung are nonacute. There is no evidence of arthropathy or other focal bone abnormality. Soft tissues are unremarkable. IMPRESSION: No acute fracture nor dislocations. Electronically Signed   By: Ashley Royalty M.D.   On: 05/11/2017 19:37   Dg Hand Complete Left  Result  Date: 05/11/2017 CLINICAL DATA:  Fall this morning with left hand injury. Initial encounter. EXAM: LEFT HAND - COMPLETE 3+ VIEW COMPARISON:  04/26/2012 FINDINGS: Subtle incomplete lucency through the medial aspect of the hamate has the same appearance as in 2013. No malalignment or definite fracture. No opaque foreign body. IMPRESSION: Subtle lucency in the medial hamate is likely from bony interface rather than avulsion fracture given 2013 comparison. Please correlate for focal tenderness. Electronically Signed   By: Monte Fantasia M.D.   On: 05/11/2017 19:44    Procedures Procedures none  Initial Impression / Assessment and Plan / UC Course  I have reviewed the triage vital signs and the nursing notes.  Pertinent labs & imaging results that were available during my care of the patient were reviewed by me and considered in my medical decision making (see chart for details).     Patient presents with both shoulder pain and wrist pain after a fall onto an outstretched hand. It does appear that she fractured her hamate. It is nondisplaced. She is placed in a splint. Anti-inflammatories, ice, Tylenol with codeine as needed, and close follow-up with her PCP recommended. She will call the office on Monday. She is to be discharged in stable condition. The patient voiced understanding and agreement to the plan.  Final Clinical Impressions(s) / UC Diagnoses   Final diagnoses:  Closed fracture of left wrist, initial encounter    New Prescriptions Discharge Medication List as of 05/11/2017  7:56 PM    START taking these medications   Details  acetaminophen-codeine (TYLENOL #3) 300-30 MG tablet Take 1-2 tablets by mouth every 6 (six) hours as needed for moderate pain., Starting Sat 05/11/2017, Print         Shelda Pal, [place] 05/11/17 2101

## 2017-05-11 NOTE — Discharge Instructions (Signed)
Follow up with PCP this week.   Ice/cold pack over area for 10-15 min every 2-3 hours while awake.  Ibuprofen 400-600 mg (2-3 over the counter strength tabs) every 6 hours as needed for pain.

## 2017-05-13 ENCOUNTER — Telehealth: Payer: Self-pay | Admitting: *Deleted

## 2017-05-13 NOTE — Telephone Encounter (Signed)
-----   Message from Arnoldo Morale, MD sent at 04/05/2017  1:48 PM EDT ----- Please inform the patient that labs are normal. Thank you.

## 2017-05-13 NOTE — Telephone Encounter (Signed)
Patient is aware of labs being normal. Medical Assistant left message on patient's home and cell voicemail. Voicemail states to give a call back to Athens Lebeau with [facility] at [phone].  

## 2017-05-20 MED FILL — ?SUMATRIPTAN SUCC 50 MG TAB: 50 | 30 days supply | Qty: 9 | Fill #2

## 2017-05-20 MED FILL — MONO-LINYAH 28 TABLET: 0.25-35 | 28 days supply | Qty: 28 | Fill #3

## 2017-05-20 MED FILL — LISINOPRIL 20 MG TAB: 20 | 30 days supply | Qty: 30 | Fill #4

## 2017-05-20 MED FILL — AMLODIPINE BESYLATE 5 MG TA: 5 | 30 days supply | Qty: 30 | Fill #4

## 2017-06-17 ENCOUNTER — Other Ambulatory Visit: Payer: Self-pay | Admitting: Family Medicine

## 2017-06-17 DIAGNOSIS — I10 Essential (primary) hypertension: Secondary | ICD-10-CM

## 2017-06-17 MED FILL — MONO-LINYAH 28 TABLET: 0.25-35 | 28 days supply | Qty: 28 | Fill #4

## 2017-06-17 MED FILL — AMLODIPINE BESYLATE 5 MG TA: 5 | 30 days supply | Qty: 30 | Fill #5

## 2017-06-17 MED FILL — LISINOPRIL 20 MG TAB: 20 | 30 days supply | Qty: 30 | Fill #5

## 2017-07-15 ENCOUNTER — Other Ambulatory Visit: Payer: Self-pay | Admitting: Family Medicine

## 2017-07-15 MED FILL — AMLODIPINE BESYLATE 5 MG TA: 5 | 30 days supply | Qty: 30 | Fill #0

## 2017-07-15 MED FILL — MONO-LINYAH 28 TABLET: 0.25-35 | 28 days supply | Qty: 28 | Fill #5

## 2017-07-15 MED FILL — LISINOPRIL 20 MG TAB: 20 | 30 days supply | Qty: 30 | Fill #0

## 2017-08-13 MED FILL — FLUTICASONE PROP 50 MCG SPR: 50 | 30 days supply | Qty: 16 | Fill #1

## 2017-08-13 MED FILL — AMLODIPINE BESYLATE 5 MG TA: 5 | 30 days supply | Qty: 30 | Fill #1

## 2017-08-13 MED FILL — MONO-LINYAH 28 TABLET: 0.25-35 | 28 days supply | Qty: 28 | Fill #6

## 2017-08-13 MED FILL — SUMATRIPTAN SUCC 50 MG TAB: 50 | 30 days supply | Qty: 9 | Fill #0

## 2017-08-13 MED FILL — LISINOPRIL 20 MG TAB: 20 | 30 days supply | Qty: 30 | Fill #1

## 2017-09-03 ENCOUNTER — Other Ambulatory Visit: Payer: Self-pay

## 2017-09-03 ENCOUNTER — Encounter: Payer: Self-pay | Admitting: Physician Assistant

## 2017-09-03 ENCOUNTER — Ambulatory Visit: Payer: BLUE CROSS/BLUE SHIELD | Attending: Family Medicine | Admitting: Physician Assistant

## 2017-09-03 VITALS — Temp 98.5°F | Resp 20 | Ht 64.0 in | Wt 218.4 lb

## 2017-09-03 DIAGNOSIS — E669 Obesity, unspecified: Secondary | ICD-10-CM | POA: Diagnosis not present

## 2017-09-03 DIAGNOSIS — Z79899 Other long term (current) drug therapy: Secondary | ICD-10-CM | POA: Diagnosis not present

## 2017-09-03 DIAGNOSIS — I10 Essential (primary) hypertension: Secondary | ICD-10-CM | POA: Diagnosis not present

## 2017-09-03 DIAGNOSIS — Z7982 Long term (current) use of aspirin: Secondary | ICD-10-CM | POA: Insufficient documentation

## 2017-09-03 DIAGNOSIS — R5383 Other fatigue: Secondary | ICD-10-CM | POA: Diagnosis not present

## 2017-09-03 DIAGNOSIS — G44209 Tension-type headache, unspecified, not intractable: Secondary | ICD-10-CM | POA: Insufficient documentation

## 2017-09-03 DIAGNOSIS — R42 Dizziness and giddiness: Secondary | ICD-10-CM | POA: Diagnosis not present

## 2017-09-03 MED ORDER — MECLIZINE HCL 25 MG PO TABS: 25.0000 mg | ORAL_TABLET | Freq: Three times a day (TID) | ORAL | 1 refills | Status: DC | PRN

## 2017-09-03 MED FILL — MECLIZINE 25 MG TABLET: 25 | 20 days supply | Qty: 60 | Fill #0

## 2017-09-03 NOTE — Progress Notes (Signed)
Patient ID: Barbara Dunn, female   DOB: [birth year], 46 y.o.   MRN: [id number]      Barbara Dunn, is a 46 y.o. female  WVP:[id number]  NIO:[id number]  DOB - [birth year]  Subjective:  Chief Complaint and HPI: Barbara Dunn is a 46 y.o. female here today for Dizziness comes and goe all day everyday for about 1 month. No precipitating or alleviating factors. +fatigue.  No vision changes/double vision.  LMP ~ 3weeks ago and on BCP for period regulation.  Periods regular.  She has had BTL.   No SOB.  +life stressors- brother in bad car accident(she checks on him 2 times daily) and she takes care of her mom, works 2 jobs.  HAs about 4 times a week.  HA are all over her whole head.  OTC meds help with HA.  Position changes do not seem to cause dizziness.  She does have some sinus congestion.    H/o vertigo and being on meclizine for several months.  This feels similar.    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 EENT:  No vision changes, No blurry vision, No hearing changes. No mouth, throat, or ear problems.  Respiratory: No cough, No SOB Cardiac: No CP, no palpitations GI:  No abd pain, No N/V/D. GU: No Urinary s/sx Musculoskeletal: No joint pain Neuro: + headaches, + dizziness, no motor weakness.  Skin: No rash Endocrine:  No polydipsia. No polyuria.  Psych: Denies SI/HI  No problems updated.  ALLERGIES: No Known Allergies  PAST MEDICAL HISTORY: Past Medical History:  Diagnosis Date  . Anemia   . Arthritis   . Hypertension   . Migraines   . Obesity     MEDICATIONS AT HOME: Prior to Admission medications   Medication Sig Start Date End Date Taking? Authorizing Provider  albuterol (PROVENTIL HFA;VENTOLIN HFA) 108 (90 Base) MCG/ACT inhaler Inhale 1 puff into the lungs every 6 (six) hours as needed for wheezing or shortness of breath. 01/23/16  Yes Arnoldo Morale, MD    amLODipine (NORVASC) 5 MG tablet Take 1 tablet (5 mg total) by mouth daily. 04/04/17  Yes Arnoldo Morale, MD  aspirin 325 MG tablet Take 325 mg by mouth daily.    Yes [provider]  BIOTIN PO Take 2 tablets by mouth daily.    Yes [provider]  cetirizine (ZYRTEC) 10 MG tablet Take 10 mg by mouth daily.   Yes [provider]  diphenhydrAMINE (BENADRYL) 25 mg capsule Take 25 mg by mouth every 6 (six) hours as needed for itching. Reported on 01/23/2016   Yes [provider]  docusate sodium (COLACE) 100 MG capsule Take 100 mg by mouth 2 (two) times daily.   Yes [provider]  fluticasone (FLONASE) 50 MCG/ACT nasal spray PLACE 2 SPRAYS INTO BOTH NOSTRILS DAILY. 03/19/17  Yes Arnoldo Morale, MD  lisinopril (PRINIVIL,ZESTRIL) 20 MG tablet Take 1 tablet (20 mg total) by mouth daily. 04/04/17  Yes Arnoldo Morale, MD  Multiple Vitamins-Minerals (MULTIVITAMIN ADULT PO) Take 1 tablet by mouth daily.    Yes [provider]  norgestimate-ethinyl estradiol (MONO-LINYAH) 0.25-35 MG-MCG tablet Take 1 tablet by mouth daily. 03/05/17  Yes Arnoldo Morale, MD  SUMAtriptan (IMITREX) 50 MG tablet Take 50 mg every 2 (two) hours as needed by mouth for migraine. May repeat in 2 hours if headache persists or recurs.   Yes  [provider]  acetaminophen-codeine (TYLENOL #3) 300-30 MG tablet Take 1-2 tablets by mouth every 6 (six) hours as needed for moderate pain. Patient not taking: Reported on 09/03/2017 05/11/17   Shelda Pal, DO  meclizine (ANTIVERT) 25 MG tablet Take 1 tablet (25 mg total) 3 (three) times daily as needed by mouth for dizziness. 09/03/17   Argentina Donovan, PA-C  nystatin (NYSTATIN) powder Apply topically 2 (two) times daily. Patient not taking: Reported on 09/03/2017 01/02/17   Arnoldo Morale, MD  SUMAtriptan (IMITREX) 50 MG tablet Take 2 tablets (100 mg total) by mouth once. May repeat in 2 hours if headache persists, max 200mg /day  04/04/17 04/04/17  Arnoldo Morale, MD     Objective:  EXAM:   Vitals:   09/03/17 1356  Resp: 20  Temp: 98.5 F (36.9 C)  TempSrc: Oral  SpO2: 98%  Weight: 218 lb 6.4 oz (99.1 kg)  Height: 5\' 4"  (1.626 m)    General appearance : A&OX3. NAD. Non-toxic-appearing, Gait and ambulation are WNL w/o assistance HEENT: Atraumatic and Normocephalic.  PERRLA. EOM intact. Fundi benign. TM full B w/o infection. Mouth-MMM, post pharynx WNL w/o erythema, No PND. + nasal congestion Neck: supple, no JVD. No cervical lymphadenopathy. No thyromegaly Chest/Lungs:  Breathing-non-labored, Good air entry bilaterally, breath sounds normal without rales, rhonchi, or wheezing  CVS: S1 S2 regular, no murmurs, gallops, rubs  Extremities: Bilateral Lower Ext shows no edema, both legs are warm to touch with = pulse throughout Neurology:  CN II-XII grossly intact, Non focal.  Heel to shin/finger to toe WNL Psych:  TP linear. J/I WNL. Normal speech. Appropriate eye contact and affect.  Skin:  No Rash  Data Review Lab Results  Component Value Date   HGBA1C 5.3 02/11/2015   HGBA1C 5.4 09/11/2011     Assessment & Plan   1. Dizziness No red flags.  EKG WNL/no ischemic changes or arrythmia  - Basic metabolic panel - CBC with Differential/Platelet - Vitamin D, 25-hydroxy - TSH -resume flonase daily - meclizine (ANTIVERT) 25 MG tablet; Take 1 tablet (25 mg total) 3 (three) times daily as needed by mouth for dizziness.  Dispense: 60 tablet; Refill: 1  2. Fatigue, unspecified type - Basic metabolic panel - CBC with Differential/Platelet - Vitamin D, 25-hydroxy - TSH  3. Tension-type headache, not intractable, unspecified chronicity pattern OTCs for HA ok to use.  Self-care is a must as she has many things/people she is trying to take care of in her life but has little time for self-care, exercise, eating healthy, etc.    Patient have been counseled extensively about nutrition and exercise  Return in  about 1 month (around 10/03/2017) for Dr Newlin([place]) f/up dizziness.  The patient was given clear instructions to go to ER or return to medical center if symptoms don't improve, worsen or new problems develop. The patient verbalized understanding. The patient was told to call to get lab results if they haven't heard anything in the next week.     Freeman Caldron, PA-C [facility] and [facility], [place]   09/03/2017, 2:29 PM

## 2017-09-03 NOTE — Progress Notes (Signed)
Would like a Rx for Topamax. Has noticed headaches more frequently Feels dizzy throughout the day.

## 2017-09-04 LAB — CBC WITH DIFFERENTIAL/PLATELET
BASOS ABS: 0 10*3/uL (ref 0.0–0.2)
Basos: 1 %
EOS (ABSOLUTE): 0.3 10*3/uL (ref 0.0–0.4)
Eos: 4 %
HEMOGLOBIN: 11.5 g/dL (ref 11.1–15.9)
Hematocrit: 34.9 % (ref 34.0–46.6)
Immature Grans (Abs): 0 10*3/uL (ref 0.0–0.1)
Immature Granulocytes: 0 %
LYMPHS ABS: 3.1 10*3/uL (ref 0.7–3.1)
Lymphs: 41 %
MCH: 29 pg (ref 26.6–33.0)
MCHC: 33 g/dL (ref 31.5–35.7)
MCV: 88 fL (ref 79–97)
MONOCYTES: 11 %
Monocytes Absolute: 0.8 10*3/uL (ref 0.1–0.9)
NEUTROS PCT: 43 %
Neutrophils Absolute: 3.4 10*3/uL (ref 1.4–7.0)
Platelets: 302 10*3/uL (ref 150–379)
RBC: 3.96 x10E6/uL (ref 3.77–5.28)
RDW: 12.6 % (ref 12.3–15.4)
WBC: 7.6 10*3/uL (ref 3.4–10.8)

## 2017-09-04 LAB — BASIC METABOLIC PANEL
BUN/Creatinine Ratio: 13 (ref 9–23)
BUN: 9 mg/dL (ref 6–24)
CO2: 23 mmol/L (ref 20–29)
CREATININE: 0.71 mg/dL (ref 0.57–1.00)
Calcium: 8.9 mg/dL (ref 8.7–10.2)
Chloride: 103 mmol/L (ref 96–106)
GFR calc Af Amer: 120 mL/min/{1.73_m2} (ref >59)
GFR calc non Af Amer: 104 mL/min/{1.73_m2} (ref >59)
Glucose: 85 mg/dL (ref 65–99)
Potassium: 3.8 mmol/L (ref 3.5–5.2)
SODIUM: 141 mmol/L (ref 134–144)

## 2017-09-04 LAB — TSH: TSH: 1.19 u[IU]/mL (ref 0.450–4.500)

## 2017-09-04 LAB — VITAMIN D 25 HYDROXY (VIT D DEFICIENCY, FRACTURES): VIT D 25 HYDROXY: 33.6 ng/mL (ref 30.0–100.0)

## 2017-09-05 ENCOUNTER — Telehealth: Payer: Self-pay | Admitting: *Deleted

## 2017-09-05 NOTE — Telephone Encounter (Signed)
Pt name and DOB verified. Pt is aware of results for labs and message from provider. She states Meclizine is helping vertigo and she is trying to rest.

## 2017-09-05 NOTE — Telephone Encounter (Signed)
Unable to reach patient. Left message on voicemail to return call.   Notes recorded by Argentina Donovan, PA-C on 09/04/2017 at 2:18 PM EST Please call patient. Labs are normal and show no cause for dizziness. Focus on self-care and rest and take meds as directed. Follow-up as planned. Thanks, Freeman Caldron, PA-C

## 2017-09-09 MED FILL — MONO-LINYAH 28 TABLET: 0.25-35 | 28 days supply | Qty: 28 | Fill #7

## 2017-09-16 MED FILL — LISINOPRIL 20 MG TABLET: 20 | 30 days supply | Qty: 30 | Fill #2

## 2017-09-16 MED FILL — AMLODIPINE BESYLATE 5 MG TA: 5 | 30 days supply | Qty: 30 | Fill #2

## 2017-10-02 MED FILL — MONO-LINYAH 28 TABLET: 0.25-35 | 28 days supply | Qty: 28 | Fill #8

## 2017-10-03 ENCOUNTER — Ambulatory Visit: Payer: BLUE CROSS/BLUE SHIELD | Attending: Family Medicine | Admitting: Family Medicine

## 2017-10-03 ENCOUNTER — Encounter: Payer: Self-pay | Admitting: Family Medicine

## 2017-10-03 VITALS — BP 141/84 | HR 78 | Temp 98.3°F | Ht 64.0 in | Wt 219.0 lb

## 2017-10-03 DIAGNOSIS — S99921A Unspecified injury of right foot, initial encounter: Secondary | ICD-10-CM | POA: Diagnosis not present

## 2017-10-03 DIAGNOSIS — Z7982 Long term (current) use of aspirin: Secondary | ICD-10-CM | POA: Insufficient documentation

## 2017-10-03 DIAGNOSIS — Z01818 Encounter for other preprocedural examination: Secondary | ICD-10-CM | POA: Diagnosis not present

## 2017-10-03 DIAGNOSIS — I10 Essential (primary) hypertension: Secondary | ICD-10-CM | POA: Diagnosis not present

## 2017-10-03 DIAGNOSIS — E669 Obesity, unspecified: Secondary | ICD-10-CM | POA: Insufficient documentation

## 2017-10-03 DIAGNOSIS — G43909 Migraine, unspecified, not intractable, without status migrainosus: Secondary | ICD-10-CM | POA: Diagnosis not present

## 2017-10-03 DIAGNOSIS — R42 Dizziness and giddiness: Secondary | ICD-10-CM | POA: Insufficient documentation

## 2017-10-03 DIAGNOSIS — Z79899 Other long term (current) drug therapy: Secondary | ICD-10-CM | POA: Diagnosis not present

## 2017-10-03 DIAGNOSIS — X58XXXA Exposure to other specified factors, initial encounter: Secondary | ICD-10-CM | POA: Insufficient documentation

## 2017-10-03 MED ORDER — AMLODIPINE BESYLATE 5 MG PO TABS: 5.0000 mg | ORAL_TABLET | Freq: Every day | ORAL | 5 refills | Status: DC

## 2017-10-03 MED ORDER — LISINOPRIL 20 MG PO TABS: 20.0000 mg | ORAL_TABLET | Freq: Every day | ORAL | 5 refills | Status: DC

## 2017-10-03 NOTE — Progress Notes (Signed)
Subjective:  Patient ID: Barbara Dunn, female    DOB: 11-11-1970  Age: 46 y.o. MRN: [id number]  CC: Dizziness   HPI Barbara Dunn s 46 year old female with a history of hypertension, migraine, obesity who presents today for completion of preoperative evaluation form. She is scheduled for left wrist arthroscopy, joint debridement and/or repair on 10/28/17 by Dr. Caralyn Guile due to a ruptured tendon in her left wrist.  At her last office visit she was treated for dizziness with meclizine and she reports some improvement in her symptoms.  Labs came back unrevealing.  She sustained a cut to the medial aspect of her right great toe while attempting to clip her toenails, 1 week ago and denies any discharge, erythema or swelling but does have minimal tenderness.  Past Medical History:  Diagnosis Date  . Anemia   . Arthritis   . Hypertension   . Migraines   . Obesity     Past Surgical History:  Procedure Laterality Date  . CARDIAC CATHETERIZATION N/A 08/02/2015   Procedure: Left Heart Cath and Coronary Angiography;  Surgeon: Troy Sine, MD;  Location: [place] [facility];  Service: Cardiovascular;  Laterality: N/A;  . CERVIX LESION DESTRUCTION    . TUBAL LIGATION    . TUBAL LIGATION      No Known Allergies   Outpatient Medications Prior to Visit  Medication Sig Dispense Refill  . albuterol (PROVENTIL HFA;VENTOLIN HFA) 108 (90 Base) MCG/ACT inhaler Inhale 1 puff into the lungs every 6 (six) hours as needed for wheezing or shortness of breath. 1 Inhaler 1  . aspirin 325 MG tablet Take 325 mg by mouth daily.     Marland Kitchen BIOTIN PO Take 2 tablets by mouth daily.     . cetirizine (ZYRTEC) 10 MG tablet Take 10 mg by mouth daily.    . diphenhydrAMINE (BENADRYL) 25 mg capsule Take 25 mg by mouth every 6 (six) hours as needed for itching. Reported on 01/23/2016    . docusate sodium (COLACE) 100 MG capsule Take 100 mg by mouth 2 (two) times daily.    . fluticasone (FLONASE) 50 MCG/ACT nasal spray PLACE 2  SPRAYS INTO BOTH NOSTRILS DAILY. 16 g 2  . meclizine (ANTIVERT) 25 MG tablet Take 1 tablet (25 mg total) 3 (three) times daily as needed by mouth for dizziness. 60 tablet 1  . Multiple Vitamins-Minerals (MULTIVITAMIN ADULT PO) Take 1 tablet by mouth daily.     . norgestimate-ethinyl estradiol (MONO-LINYAH) 0.25-35 MG-MCG tablet Take 1 tablet by mouth daily. 28 tablet 11  . SUMAtriptan (IMITREX) 50 MG tablet Take 50 mg every 2 (two) hours as needed by mouth for migraine. May repeat in 2 hours if headache persists or recurs.    Marland Kitchen amLODipine (NORVASC) 5 MG tablet Take 1 tablet (5 mg total) by mouth daily. 30 tablet 5  . lisinopril (PRINIVIL,ZESTRIL) 20 MG tablet Take 1 tablet (20 mg total) by mouth daily. 30 tablet 5  . acetaminophen-codeine (TYLENOL #3) 300-30 MG tablet Take 1-2 tablets by mouth every 6 (six) hours as needed for moderate pain. (Patient not taking: Reported on 09/03/2017) 10 tablet 0  . nystatin (NYSTATIN) powder Apply topically 2 (two) times daily. (Patient not taking: Reported on 09/03/2017) 45 g 1  . SUMAtriptan (IMITREX) 50 MG tablet Take 2 tablets (100 mg total) by mouth once. May repeat in 2 hours if headache persists, max 200mg /day 10 tablet 2   No facility-administered medications prior to visit.     ROS Review  of Systems  Constitutional: Negative for activity change, appetite change and fatigue.  HENT: Negative for congestion, sinus pressure and sore throat.   Eyes: Negative for visual disturbance.  Respiratory: Negative for cough, chest tightness, shortness of breath and wheezing.   Cardiovascular: Negative for chest pain and palpitations.  Gastrointestinal: Negative for abdominal distention, abdominal pain and constipation.  Endocrine: Negative for polydipsia.  Genitourinary: Negative for dysuria and frequency.  Musculoskeletal:       See hpi  Skin: Negative for rash.  Neurological: Negative for tremors, light-headedness and numbness.  Hematological: Does not  bruise/bleed easily.  Psychiatric/Behavioral: Negative for agitation and behavioral problems.    Objective:  BP (!) 141/84   Pulse 78   Temp 98.3 F (36.8 C) (Oral)   Ht 5\' 4"  (1.626 m)   Wt 219 lb (99.3 kg)   LMP 10/01/2017   SpO2 100%   BMI 37.59 kg/m   BP/Weight 10/03/2017 09/03/2017 1/63/8466  Systolic BP 599 - 357  Diastolic BP 84 - 82  Wt. (Lbs) 219 218.4 -  BMI 37.59 37.49 -      Physical Exam  Constitutional: She is oriented to person, place, and time. She appears well-developed and well-nourished.  Cardiovascular: Normal rate, normal heart sounds and intact distal pulses.  No murmur heard. Pulmonary/Chest: Effort normal and breath sounds normal. She has no wheezes. She has no rales. She exhibits no tenderness.  Abdominal: Soft. Bowel sounds are normal. She exhibits no distension and no mass. There is no tenderness.  Musculoskeletal: Normal range of motion.  Left wrist in a brace  Neurological: She is alert and oriented to person, place, and time.  Skin:  Minute laceration on the medial aspect of right great toe. No drainage, no surrounding tenderness  Psychiatric: She has a normal mood and affect.     Assessment & Plan:   1. Essential hypertension Controlled Low-sodium diet - amLODipine (NORVASC) 5 MG tablet; Take 1 tablet (5 mg total) by mouth daily.  Dispense: 30 tablet; Refill: 5 - lisinopril (PRINIVIL,ZESTRIL) 20 MG tablet; Take 1 tablet (20 mg total) by mouth daily.  Dispense: 30 tablet; Refill: 5  2. Preoperative clearance Completed preop clearance form for left wrist arthroscopy and the form was handed to the patient at the end of the encounter.  3. Toe injury, right, initial encounter No evidence of infection Advised on warm soaks  4. Vertigo Controlled on meclizine   Meds ordered this encounter  Medications  . amLODipine (NORVASC) 5 MG tablet    Sig: Take 1 tablet (5 mg total) by mouth daily.    Dispense:  30 tablet    Refill:  5  .  lisinopril (PRINIVIL,ZESTRIL) 20 MG tablet    Sig: Take 1 tablet (20 mg total) by mouth daily.    Dispense:  30 tablet    Refill:  5    Follow-up: Return in about 3 months (around 01/01/2018) for follow up on chronic medical conditions.   Arnoldo Morale MD

## 2017-10-03 NOTE — Patient Instructions (Signed)

## 2017-10-03 NOTE — Progress Notes (Signed)
Pt has cut on right toe.dizziness

## 2017-10-16 ENCOUNTER — Other Ambulatory Visit: Payer: Self-pay | Admitting: Family Medicine

## 2017-10-16 ENCOUNTER — Other Ambulatory Visit: Payer: Self-pay | Admitting: Physician Assistant

## 2017-10-16 DIAGNOSIS — R42 Dizziness and giddiness: Secondary | ICD-10-CM

## 2017-10-16 MED FILL — MECLIZINE 25 MG TABLET: 25 | 20 days supply | Qty: 60 | Fill #1

## 2017-10-16 MED FILL — AMLODIPINE BESYLATE 5 MG TA: 5 | 30 days supply | Qty: 30 | Fill #3

## 2017-10-16 MED FILL — LISINOPRIL 20 MG TAB: 20 | 30 days supply | Qty: 30 | Fill #3

## 2017-10-16 MED FILL — SUMATRIPTAN SUCC 50 MG TAB: 50 | 30 days supply | Qty: 9 | Fill #1

## 2017-10-16 MED FILL — FLUTICASONE PROP 50 MCG SPR: 50 | 30 days supply | Qty: 16 | Fill #2

## 2017-11-18 MED FILL — LISINOPRIL 20 MG TAB: 20 | 30 days supply | Qty: 30 | Fill #4

## 2017-11-18 MED FILL — AMLODIPINE BESYLATE 5 MG TA: 5 | 30 days supply | Qty: 30 | Fill #4

## 2017-11-18 MED FILL — MONO-LINYAH 28 TABLET: 0.25-35 | 28 days supply | Qty: 28 | Fill #9

## 2017-12-16 ENCOUNTER — Other Ambulatory Visit: Payer: Self-pay | Admitting: Family Medicine

## 2017-12-16 DIAGNOSIS — J302 Other seasonal allergic rhinitis: Secondary | ICD-10-CM

## 2017-12-16 MED FILL — MONO-LINYAH 28 TABLET: 0.25-35 | 28 days supply | Qty: 28 | Fill #10

## 2017-12-16 MED FILL — VENTOLIN HFA 90 MCG INHALER: 108 (90 BAS | 30 days supply | Qty: 18 | Fill #0

## 2017-12-16 MED FILL — SUMATRIPTAN SUCC 50 MG TAB: 50 | 30 days supply | Qty: 9 | Fill #2

## 2017-12-16 MED FILL — AMLODIPINE BESYLATE 5 MG TA: 5 | 30 days supply | Qty: 30 | Fill #5

## 2017-12-16 MED FILL — FLUTICASONE PROP 50 MCG SPR: 50 | 30 days supply | Qty: 16 | Fill #0

## 2017-12-16 MED FILL — LISINOPRIL 20 MG TAB: 20 | 30 days supply | Qty: 30 | Fill #5

## 2017-12-25 ENCOUNTER — Encounter (HOSPITAL_COMMUNITY): Payer: Self-pay | Admitting: Family Medicine

## 2017-12-25 DIAGNOSIS — Z79899 Other long term (current) drug therapy: Secondary | ICD-10-CM | POA: Diagnosis not present

## 2017-12-25 DIAGNOSIS — R51 Headache: Secondary | ICD-10-CM | POA: Insufficient documentation

## 2017-12-25 DIAGNOSIS — Z87891 Personal history of nicotine dependence: Secondary | ICD-10-CM | POA: Insufficient documentation

## 2017-12-25 DIAGNOSIS — Z7982 Long term (current) use of aspirin: Secondary | ICD-10-CM | POA: Diagnosis not present

## 2017-12-25 DIAGNOSIS — R202 Paresthesia of skin: Secondary | ICD-10-CM | POA: Insufficient documentation

## 2017-12-25 DIAGNOSIS — I10 Essential (primary) hypertension: Secondary | ICD-10-CM | POA: Diagnosis not present

## 2017-12-25 DIAGNOSIS — R200 Anesthesia of skin: Secondary | ICD-10-CM | POA: Diagnosis not present

## 2017-12-25 NOTE — ED Triage Notes (Signed)
Patient reports she started experiencing intermittent head numbness for the last two weeks. However, today it has lasted longer than normal. Patient reports at 9am she started experiencing head numbness, then a headache, then numbness, then headache, and now head numbness. Patient is alert, oriented x 4, and using cell phone while in triage.

## 2017-12-26 ENCOUNTER — Emergency Department (HOSPITAL_COMMUNITY): Payer: BLUE CROSS/BLUE SHIELD

## 2017-12-26 ENCOUNTER — Emergency Department (HOSPITAL_COMMUNITY)
Admission: EM | Admit: 2017-12-26 | Discharge: 2017-12-26 | Disposition: A | Discharge status: Home / Self Care | Payer: BLUE CROSS/BLUE SHIELD | Attending: Emergency Medicine | Admitting: Emergency Medicine

## 2017-12-26 DIAGNOSIS — R51 Headache: Secondary | ICD-10-CM

## 2017-12-26 DIAGNOSIS — R519 Headache, unspecified: Secondary | ICD-10-CM

## 2017-12-26 DIAGNOSIS — R202 Paresthesia of skin: Secondary | ICD-10-CM

## 2017-12-26 DIAGNOSIS — R200 Anesthesia of skin: Secondary | ICD-10-CM | POA: Diagnosis not present

## 2017-12-26 LAB — BASIC METABOLIC PANEL
ANION GAP: 9 (ref 5–15)
BUN: 7 mg/dL (ref 6–20)
CHLORIDE: 104 mmol/L (ref 101–111)
CO2: 25 mmol/L (ref 22–32)
Calcium: 8.8 mg/dL — ABNORMAL LOW (ref 8.9–10.3)
Creatinine, Ser: 0.73 mg/dL (ref 0.44–1.00)
GFR calc Af Amer: >60 mL/min (ref >60)
GLUCOSE: 101 mg/dL — AB (ref 65–99)
POTASSIUM: 3.3 mmol/L — AB (ref 3.5–5.1)
Sodium: 138 mmol/L (ref 135–145)

## 2017-12-26 MED ORDER — DIPHENHYDRAMINE HCL 50 MG/ML IJ SOLN
12.5000 mg | Freq: Once | INTRAMUSCULAR | Status: AC
  Administered 2017-12-26: 12.5 mg via INTRAVENOUS
  Filled 2017-12-26: qty 1

## 2017-12-26 MED ORDER — METOCLOPRAMIDE HCL 5 MG/ML IJ SOLN
10.0000 mg | Freq: Once | INTRAMUSCULAR | Status: AC
  Administered 2017-12-26: 10 mg via INTRAVENOUS
  Filled 2017-12-26: qty 2

## 2017-12-26 MED ORDER — KETOROLAC TROMETHAMINE 15 MG/ML IJ SOLN
15.0000 mg | Freq: Once | INTRAMUSCULAR | Status: AC
  Administered 2017-12-26: 15 mg via INTRAVENOUS
  Filled 2017-12-26: qty 1

## 2017-12-26 NOTE — Discharge Instructions (Signed)
Follow up with your doctor for recheck of scalp numbness and if headache pain recurs. Return here with any worsening symptoms or new concerns.

## 2017-12-26 NOTE — ED Provider Notes (Signed)
[place] DEPT Provider Note   CSN: [id number] Arrival date & time: 12/25/17  1619     History   Chief Complaint Chief Complaint  Patient presents with  . Headache    HPI Barbara Dunn is a 47 y.o. female.  Patient with a history of HTN, migraine, arthritis presents with complaint of scalp numbness for the past 2 weeks without other symptoms. Today, she started having headache as well in bilateral frontal area. No visual changes, nausea, vomiting, fever, URI or sinus symptoms. No modifying factors to the headache. She has a history of migraines but reports current headache is unlike previous headaches. No extremity numbness or weakness.    The history is provided by the patient. No language interpreter was used.  Headache   Pertinent negatives include no fever and no shortness of breath.    Past Medical History:  Diagnosis Date  . Anemia   . Arthritis   . Hypertension   . Migraines   . Obesity     Patient Active Problem List   Diagnosis Date Noted  . Seasonal allergies 01/23/2016  . Oral contraceptive use 10/19/2015  . Pain in the chest   . Palpitations   . Abnormal nuclear stress test   . Dizziness and giddiness 05/30/2015  . Chest pain 05/30/2015  . Knee contusion 05/04/2015  . Vaginal discharge 02/12/2015  . Abnormal uterine bleeding 02/11/2015  . 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 Shortness of breath 04/20/2014  . Knee pain 04/20/2014  . 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 Candidal intertrigo 11/09/2013  . Health care maintenance 11/09/2013  . Hair loss 09/15/2013  . Pain in right shoulder 08/18/2013  . 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 Hypertension 07/13/2012  . Leg pain 03/27/2012  . Migraine without aura 01/17/2007  . OBESITY, NOS 12/19/2006    Past Surgical History:  Procedure Laterality Date  . CARDIAC CATHETERIZATION N/A 08/02/2015   Procedure: Left Heart Cath and Coronary Angiography;  Surgeon: Troy Sine, MD;  Location: [place] [facility];  Service: Cardiovascular;  Laterality: N/A;  . CERVIX LESION DESTRUCTION    . TUBAL LIGATION    . TUBAL LIGATION      OB History    No data available       Home Medications    Prior to Admission medications   Medication Sig Start Date End Date Taking? Authorizing Provider  acetaminophen-codeine (TYLENOL #3) 300-30 MG tablet Take 1-2 tablets by mouth every 6 (six) hours as needed for moderate pain. Patient not taking: Reported on 09/03/2017 05/11/17   Shelda Pal, DO  amLODipine (NORVASC) 5 MG tablet Take 1 tablet (5 mg total) by mouth daily. 10/03/17   Charlott Rakes, MD  aspirin 325 MG tablet Take 325 mg by mouth daily.     [provider]  BIOTIN PO Take 2 tablets by mouth daily.     [provider]  cetirizine (ZYRTEC) 10 MG tablet Take 10 mg by mouth daily.    [provider]  diphenhydrAMINE (BENADRYL) 25 mg capsule Take 25 mg by mouth every 6 (six) hours as needed for itching. Reported on 01/23/2016    [provider]  docusate sodium (COLACE) 100 MG capsule Take 100 mg by mouth 2 (two) times daily.    [provider]  fluticasone (FLONASE) 50 MCG/ACT nasal spray PLACE 2 SPRAYS INTO BOTH NOSTRILS DAILY. 12/16/17   Charlott Rakes, MD  lisinopril (PRINIVIL,ZESTRIL) 20 MG tablet Take 1 tablet (20  mg total) by mouth daily. 10/03/17   Charlott Rakes, MD  meclizine (ANTIVERT) 25 MG tablet Take 1 tablet (25 mg total) 3 (three) times daily as needed by mouth for dizziness. 09/03/17   Argentina Donovan, PA-C  Multiple Vitamins-Minerals (MULTIVITAMIN ADULT PO) Take 1 tablet by mouth daily.     [provider]  norgestimate-ethinyl estradiol (MONO-LINYAH) 0.25-35 MG-MCG tablet Take 1 tablet by mouth daily. 03/05/17   Charlott Rakes, MD  nystatin (NYSTATIN) powder Apply topically 2 (two) times daily. Patient not taking: Reported on 09/03/2017 01/02/17   Charlott Rakes, MD  SUMAtriptan  (IMITREX) 50 MG tablet Take 2 tablets (100 mg total) by mouth once. May repeat in 2 hours if headache persists, max 200mg /day 04/04/17 04/04/17  Charlott Rakes, MD  SUMAtriptan (IMITREX) 50 MG tablet Take 50 mg every 2 (two) hours as needed by mouth for migraine. May repeat in 2 hours if headache persists or recurs.    [provider]  VENTOLIN HFA 108 (90 Base) MCG/ACT inhaler INHALE 1 PUFF INTO THE LUNGS EVERY 6 HOURS AS NEEDED FOR WHEEZING OR SHORTNESS OF BREATH. 12/16/17   Charlott Rakes, MD    Family History Family History  Problem Relation Age of Onset  . Hypertension Mother   . Schizophrenia Mother   . Schizophrenia Sister   . Schizophrenia Brother   . Schizophrenia Brother     Social History Social History   Tobacco Use  . Smoking status: Former Smoker    Last attempt to quit: 12/15/2003    Years since quitting: 14.0  . Smokeless tobacco: Never Used  . Tobacco comment: quit 10 years ago  Substance Use Topics  . Alcohol use: Yes    Alcohol/week: 1.2 oz    Types: 1 Glasses of wine, 1 Shots of liquor per week    Comment: Once every 3 months   . Drug use: No     Allergies   Patient has no known allergies.   Review of Systems Review of Systems  Constitutional: Negative for chills and fever.  HENT: Negative.  Negative for sinus pressure, sinus pain and sore throat.   Respiratory: Negative.  Negative for cough and shortness of breath.   Cardiovascular: Negative.   Gastrointestinal: Negative.   Musculoskeletal: Negative.   Skin: Negative.   Neurological: Positive for numbness (of scalp) and headaches.     Physical Exam Updated Vital Signs BP (!) 143/101 (BP Location: Left Arm)   Pulse 75   Temp 99.1 F (37.3 C) (Oral)   Resp 18   Ht 5\' 3"  (1.6 m)   Wt 97.5 kg (215 lb)   LMP 12/21/2017   SpO2 100%   BMI 38.09 kg/m   Physical Exam  Constitutional: She is oriented to person, place, and time. She appears well-developed and well-nourished.  HENT:    Head: Normocephalic.  Neck: Normal range of motion. Neck supple.  Cardiovascular: Normal rate and regular rhythm.  Pulmonary/Chest: Effort normal and breath sounds normal.  Abdominal: Soft. Bowel sounds are normal. There is no tenderness. There is no rebound and no guarding.  Musculoskeletal: Normal range of motion.  Neurological: She is alert and oriented to person, place, and time. She has normal strength. She displays a negative Romberg sign. Coordination normal.  Decreased sensation to parietal scalp. CN's 3-12 grossly intact. Speech is clear and focused. No facial asymmetry. No lateralizing weakness. Reflexes are equal. No deficits of coordination. Ambulatory without imbalance.    Skin: Skin is warm and dry. No  rash noted.  Psychiatric: She has a normal mood and affect.     ED Treatments / Results  Labs (all labs ordered are listed, but only abnormal results are displayed) Labs Reviewed  BASIC METABOLIC PANEL - Abnormal; Notable for the following components:      Result Value   Potassium 3.3 (*)    Glucose, Bld 101 (*)    Calcium 8.8 (*)    All other components within normal limits    EKG  EKG Interpretation None       Radiology No results found.  Procedures Procedures (including critical care time)  Medications Ordered in ED Medications  metoCLOPramide (REGLAN) injection 10 mg (10 mg Intravenous Given 12/26/17 0224)  diphenhydrAMINE (BENADRYL) injection 12.5 mg (12.5 mg Intravenous Given 12/26/17 0224)  ketorolac (TORADOL) 15 MG/ML injection 15 mg (15 mg Intravenous Given 12/26/17 0224)     Initial Impression / Assessment and Plan / ED Course  I have reviewed the triage vital signs and the nursing notes.  Pertinent labs & imaging results that were available during my care of the patient were reviewed by me and considered in my medical decision making (see chart for details).     Patient here with numbness of scalp x 2 weeks. No other neurologic complaint.  Normal neurologic exam with the exception of sensory deficit of parietal scalp. She complained of headache today on and off.   Headache cocktail provided with resolution of the headache. Numbness is constant. VSS.    She is felt appropriate for discharge with significant neurologic deficits on exam. She will follow up with her doctor for further evaluation of paresthesia  Final Clinical Impressions(s) / ED Diagnoses   Final diagnoses:  None   1. Headache 2. Paresthesia  ED Discharge Orders    None       Charlann Lange, PA-C 12/26/17 [id number]    Rolland Porter, MD 12/26/17 [phone]

## 2018-01-01 ENCOUNTER — Encounter: Payer: Self-pay | Admitting: Family Medicine

## 2018-01-01 ENCOUNTER — Ambulatory Visit: Payer: BLUE CROSS/BLUE SHIELD | Attending: Family Medicine | Admitting: Family Medicine

## 2018-01-01 VITALS — BP 128/91 | HR 73 | Temp 98.0°F | Ht 64.0 in | Wt 217.6 lb

## 2018-01-01 DIAGNOSIS — Z9889 Other specified postprocedural states: Secondary | ICD-10-CM | POA: Insufficient documentation

## 2018-01-01 DIAGNOSIS — Z79899 Other long term (current) drug therapy: Secondary | ICD-10-CM | POA: Diagnosis not present

## 2018-01-01 DIAGNOSIS — Z7982 Long term (current) use of aspirin: Secondary | ICD-10-CM | POA: Insufficient documentation

## 2018-01-01 DIAGNOSIS — E669 Obesity, unspecified: Secondary | ICD-10-CM | POA: Diagnosis not present

## 2018-01-01 DIAGNOSIS — M50223 Other cervical disc displacement at C6-C7 level: Secondary | ICD-10-CM | POA: Insufficient documentation

## 2018-01-01 DIAGNOSIS — R42 Dizziness and giddiness: Secondary | ICD-10-CM

## 2018-01-01 DIAGNOSIS — Z131 Encounter for screening for diabetes mellitus: Secondary | ICD-10-CM

## 2018-01-01 DIAGNOSIS — R202 Paresthesia of skin: Secondary | ICD-10-CM | POA: Diagnosis not present

## 2018-01-01 DIAGNOSIS — G43009 Migraine without aura, not intractable, without status migrainosus: Secondary | ICD-10-CM

## 2018-01-01 DIAGNOSIS — I10 Essential (primary) hypertension: Secondary | ICD-10-CM | POA: Diagnosis not present

## 2018-01-01 DIAGNOSIS — B353 Tinea pedis: Secondary | ICD-10-CM | POA: Diagnosis not present

## 2018-01-01 DIAGNOSIS — M50323 Other cervical disc degeneration at C6-C7 level: Secondary | ICD-10-CM | POA: Insufficient documentation

## 2018-01-01 LAB — POCT GLYCOSYLATED HEMOGLOBIN (HGB A1C): HEMOGLOBIN A1C: 5.3

## 2018-01-01 MED ORDER — MECLIZINE HCL 25 MG PO TABS: 25.0000 mg | ORAL_TABLET | Freq: Three times a day (TID) | ORAL | 1 refills | Status: DC | PRN

## 2018-01-01 MED ORDER — TERBINAFINE HCL 1 % EX CREA: 1.0000 "application " | TOPICAL_CREAM | Freq: Two times a day (BID) | CUTANEOUS | 1 refills | Status: DC

## 2018-01-01 MED ORDER — SUMATRIPTAN SUCCINATE 50 MG PO TABS: 100.0000 mg | ORAL_TABLET | Freq: Once | ORAL | 2 refills | Status: DC

## 2018-01-01 MED ORDER — LISINOPRIL 20 MG PO TABS: 20.0000 mg | ORAL_TABLET | Freq: Every day | ORAL | 5 refills | Status: DC

## 2018-01-01 MED ORDER — AMLODIPINE BESYLATE 5 MG PO TABS: 5.0000 mg | ORAL_TABLET | Freq: Every day | ORAL | 5 refills | Status: DC

## 2018-01-01 NOTE — Progress Notes (Signed)
Subjective:  Patient ID: Barbara Dunn, female    DOB: 16-Oct-1971  Age: 47 y.o. MRN: [id number]  CC: Hypertension and Hospitalization Follow-up   HPI Derek Huneycutt is a  47 year old female with a history of hypertension, migraine, obesity who presents today for a follow up visit. She is s/p left wrist arthroscopy, joint debridement and repair on 10/28/17 by Dr. Caralyn Guile due to a ruptured tendon in her left wrist reports improvement in symptoms.  One week ago she was seen at the emergency room with complaints of paresthesia in her scalp which she stated had been intermittent and ongoing for the last 3 weeks and would usually last for a few hours and then resolve.  Symptoms had lasted for close to 24 hours which led to her presentation to the ED. 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 She was treated with a headache cocktail and subsequently discharged.  She informs me today paresthesia does not occur with her migraines and feels different.  She denies paresthesia in her scalp at this time and denies associated symptoms of nausea, vomiting, blurry vision during those episodes.  She also endorses being stressed lately. Migraine has been controlled on Imitrex and she is doing well on her antihypertensive. She has a rash on both big toes which commenced up that she had a pedicure recently and has been applying Gold Bond cream with some relief.  Past Medical History:  Diagnosis Date  . Anemia   . Arthritis   . Hypertension   . Migraines   . Obesity     Past Surgical History:  Procedure Laterality Date  . CARDIAC CATHETERIZATION N/A 08/02/2015   Procedure: Left Heart Cath and Coronary Angiography;  Surgeon: Troy Sine, MD;  Location: [facility];  Service: Cardiovascular;  Laterality: N/A;  . CERVIX LESION  DESTRUCTION    . TUBAL LIGATION    . TUBAL LIGATION      No Known Allergies   Outpatient Medications Prior to Visit  Medication Sig Dispense Refill  . aspirin 325 MG tablet Take 325 mg by mouth daily.     Marland Kitchen BIOTIN PO Take 2 tablets by mouth daily.     . cetirizine (ZYRTEC) 10 MG tablet Take 10 mg by mouth daily.    . diphenhydrAMINE (BENADRYL) 25 mg capsule Take 25 mg by mouth every 6 (six) hours as needed for itching. Reported on 01/23/2016    . docusate sodium (COLACE) 100 MG capsule Take 100 mg by mouth 2 (two) times daily.    . fluticasone (FLONASE) 50 MCG/ACT nasal spray PLACE 2 SPRAYS INTO BOTH NOSTRILS DAILY. 16 g 2  . Multiple Vitamins-Minerals (MULTIVITAMIN ADULT PO) Take 1 tablet by mouth daily.     . norgestimate-ethinyl estradiol (MONO-LINYAH) 0.25-35 MG-MCG tablet Take 1 tablet by mouth daily. 28 tablet 11  . VENTOLIN HFA 108 (90 Base) MCG/ACT inhaler INHALE 1 PUFF INTO THE LUNGS EVERY 6 HOURS AS NEEDED FOR WHEEZING OR SHORTNESS OF BREATH. 18 g 1  . amLODipine (NORVASC) 5 MG tablet Take 1 tablet (5 mg total) by mouth daily. 30 tablet 5  . lisinopril (PRINIVIL,ZESTRIL) 20 MG tablet Take 1 tablet (20 mg total) by mouth daily. 30 tablet 5  . meclizine (ANTIVERT) 25 MG tablet Take 1 tablet (25 mg total) 3 (three) times daily as needed by mouth for dizziness. 60 tablet 1  . SUMAtriptan (  IMITREX) 50 MG tablet Take 50 mg every 2 (two) hours as needed by mouth for migraine. May repeat in 2 hours if headache persists or recurs.    Marland Kitchen acetaminophen-codeine (TYLENOL #3) 300-30 MG tablet Take 1-2 tablets by mouth every 6 (six) hours as needed for moderate pain. (Patient not taking: Reported on 09/03/2017) 10 tablet 0  . nystatin (NYSTATIN) powder Apply topically 2 (two) times daily. (Patient not taking: Reported on 09/03/2017) 45 g 1  . SUMAtriptan (IMITREX) 50 MG tablet Take 2 tablets (100 mg total) by mouth once. May repeat in 2 hours if headache persists, max 266m/day 10 tablet 2   No  facility-administered medications prior to visit.     ROS Review of Systems  Constitutional: Negative for activity change, appetite change and fatigue.  HENT: Negative for congestion, sinus pressure and sore throat.   Eyes: Negative for visual disturbance.  Respiratory: Negative for cough, chest tightness, shortness of breath and wheezing.   Cardiovascular: Negative for chest pain and palpitations.  Gastrointestinal: Negative for abdominal distention, abdominal pain and constipation.  Endocrine: Negative for polydipsia.  Genitourinary: Negative for dysuria and frequency.  Musculoskeletal: Negative for arthralgias and back pain.  Skin: Positive for rash.  Neurological: Negative for tremors, light-headedness and numbness.  Hematological: Does not bruise/bleed easily.  Psychiatric/Behavioral: Negative for agitation and behavioral problems.    Objective:  BP (!) 128/91   Pulse 73   Temp 98 F (36.7 C) (Oral)   Ht 5' 4"  (1.626 m)   Wt 217 lb 9.6 oz (98.7 kg)   LMP 12/21/2017   SpO2 100%   BMI 37.35 kg/m   BP/Weight 01/01/2018 38/0/044731/02/8062 Systolic BP 18681548-  Diastolic BP 91 72 -  Wt. (Lbs) 217.6 - 215  BMI 37.35 38.09 -      Physical Exam  Constitutional: She is oriented to person, place, and time. She appears well-developed and well-nourished.  Cardiovascular: Normal rate, normal heart sounds and intact distal pulses.  No murmur heard. Pulmonary/Chest: Effort normal and breath sounds normal. She has no wheezes. She has no rales. She exhibits no tenderness.  Abdominal: Soft. Bowel sounds are normal. She exhibits no distension and no mass. There is no tenderness.  Musculoskeletal: Normal range of motion.  Neurological: She is alert and oriented to person, place, and time.  Skin: Skin is warm and dry.  Psychiatric: She has a normal mood and affect.    CMP Latest Ref Rng & Units 12/26/2017 09/03/2017 01/02/2017  Glucose 65 - 99 mg/dL 101(H) 85 64(L)  BUN 6 - 20  mg/dL 7 9 11   Creatinine 0.44 - 1.00 mg/dL 0.73 0.71 0.86  Sodium 135 - 145 mmol/L 138 141 141  Potassium 3.5 - 5.1 mmol/L 3.3(L) 3.8 3.8  Chloride 101 - 111 mmol/L 104 103 106  CO2 22 - 32 mmol/L 25 23 28   Calcium 8.9 - 10.3 mg/dL 8.8(L) 8.9 9.0  Total Protein 6.1 - 8.1 g/dL - - 6.7  Total Bilirubin 0.2 - 1.2 mg/dL - - 0.3  Alkaline Phos 33 - 115 U/L - - 39  AST 10 - 30 U/L - - 17  ALT 6 - 29 U/L - - 13    Lipid Panel     Component Value Date/Time   CHOL 174 07/27/2016 0917   TRIG 69 07/27/2016 0917   HDL 98 07/27/2016 0917   CHOLHDL 1.8 07/27/2016 0917   VLDL 14 07/27/2016 0917   LDLCALC 62 07/27/2016 0917  Lab Results  Component Value Date   HGBA1C 5.3 01/01/2018    Assessment & Plan:   1. Essential hypertension Controlled Counseled on blood pressure goal of less than 130/80, low-sodium, DASH diet, medication compliance, 150 minutes of moderate intensity exercise per week. Discussed medication compliance, adverse effects. - CMP14+EGFR - Lipid panel - amLODipine (NORVASC) 5 MG tablet; Take 1 tablet (5 mg total) by mouth daily.  Dispense: 30 tablet; Refill: 5 - lisinopril (PRINIVIL,ZESTRIL) 20 MG tablet; Take 1 tablet (20 mg total) by mouth daily.  Dispense: 30 tablet; Refill: 5  2. Dizziness Stable - meclizine (ANTIVERT) 25 MG tablet; Take 1 tablet (25 mg total) by mouth 3 (three) times daily as needed for dizziness.  Dispense: 60 tablet; Refill: 1  3. Migraine without aura and without status migrainosus, not intractable Controlled - SUMAtriptan (IMITREX) 50 MG tablet; Take 2 tablets (100 mg total) by mouth once for 1 dose. May repeat in 2 hours if headache persists, max 212m/day  Dispense: 10 tablet; Refill: 2  4. Paresthesia Unknown etiology - absent at this time ?subtle migraine vs stress related - Vitamin B12  5. Diabetes mellitus screening A1c 5.3 - POCT glycosylated hemoglobin (Hb A1C)  6. Tinea pedis of both feet - terbinafine (LAMISIL AT) 1 %  cream; Apply 1 application topically 2 (two) times daily.  Dispense: 30 g; Refill: 1   Meds ordered this encounter  Medications  . amLODipine (NORVASC) 5 MG tablet    Sig: Take 1 tablet (5 mg total) by mouth daily.    Dispense:  30 tablet    Refill:  5  . meclizine (ANTIVERT) 25 MG tablet    Sig: Take 1 tablet (25 mg total) by mouth 3 (three) times daily as needed for dizziness.    Dispense:  60 tablet    Refill:  1  . lisinopril (PRINIVIL,ZESTRIL) 20 MG tablet    Sig: Take 1 tablet (20 mg total) by mouth daily.    Dispense:  30 tablet    Refill:  5  . SUMAtriptan (IMITREX) 50 MG tablet    Sig: Take 2 tablets (100 mg total) by mouth once for 1 dose. May repeat in 2 hours if headache persists, max 204mday    Dispense:  10 tablet    Refill:  2  . terbinafine (LAMISIL AT) 1 % cream    Sig: Apply 1 application topically 2 (two) times daily.    Dispense:  30 g    Refill:  1    Follow-up: Return in about 3 months (around 04/03/2018) for follow up of chronic medical condition.   EnCharlott RakesD

## 2018-01-01 NOTE — Progress Notes (Signed)
Patient states that her foot is itching and want to get it checked out.

## 2018-01-01 NOTE — Patient Instructions (Signed)

## 2018-01-02 LAB — CMP14+EGFR
ALT: 15 IU/L (ref 0–32)
AST: 21 IU/L (ref 0–40)
Albumin/Globulin Ratio: 1.5 (ref 1.2–2.2)
Albumin: 4.2 g/dL (ref 3.5–5.5)
Alkaline Phosphatase: 45 IU/L (ref 39–117)
BUN/Creatinine Ratio: 14 (ref 9–23)
BUN: 11 mg/dL (ref 6–24)
Bilirubin Total: 0.3 mg/dL (ref 0.0–1.2)
CALCIUM: 8.9 mg/dL (ref 8.7–10.2)
CO2: 21 mmol/L (ref 20–29)
Chloride: 103 mmol/L (ref 96–106)
Creatinine, Ser: 0.79 mg/dL (ref 0.57–1.00)
GFR, EST AFRICAN AMERICAN: 105 mL/min/{1.73_m2} (ref >59)
GFR, EST NON AFRICAN AMERICAN: 91 mL/min/{1.73_m2} (ref >59)
GLUCOSE: 78 mg/dL (ref 65–99)
Globulin, Total: 2.8 g/dL (ref 1.5–4.5)
Potassium: 3.9 mmol/L (ref 3.5–5.2)
Sodium: 139 mmol/L (ref 134–144)
TOTAL PROTEIN: 7 g/dL (ref 6.0–8.5)

## 2018-01-02 LAB — LIPID PANEL
CHOL/HDL RATIO: 2.2 ratio (ref 0.0–4.4)
Cholesterol, Total: 185 mg/dL (ref 100–199)
HDL: 85 mg/dL (ref >39)
LDL Calculated: 86 mg/dL (ref 0–99)
Triglycerides: 69 mg/dL (ref 0–149)
VLDL CHOLESTEROL CAL: 14 mg/dL (ref 5–40)

## 2018-01-02 LAB — VITAMIN B12: VITAMIN B 12: 575 pg/mL (ref 232–1245)

## 2018-01-13 MED FILL — AMLODIPINE BESYLATE 5 MG TA: 5 | 30 days supply | Qty: 30 | Fill #0

## 2018-01-13 MED FILL — LISINOPRIL 20 MG TAB: 20 | 30 days supply | Qty: 30 | Fill #0

## 2018-01-13 MED FILL — MONO-LINYAH 28 TABLET: 0.25-35 | 28 days supply | Qty: 28 | Fill #11

## 2018-01-28 DIAGNOSIS — M545 Low back pain: Secondary | ICD-10-CM | POA: Diagnosis not present

## 2018-01-28 DIAGNOSIS — M542 Cervicalgia: Secondary | ICD-10-CM | POA: Diagnosis not present

## 2018-02-06 DIAGNOSIS — M545 Low back pain: Secondary | ICD-10-CM | POA: Diagnosis not present

## 2018-02-06 DIAGNOSIS — M542 Cervicalgia: Secondary | ICD-10-CM | POA: Diagnosis not present

## 2018-02-10 ENCOUNTER — Other Ambulatory Visit: Payer: Self-pay | Admitting: Family Medicine

## 2018-02-10 DIAGNOSIS — Z3041 Encounter for surveillance of contraceptive pills: Secondary | ICD-10-CM

## 2018-02-10 MED FILL — MONO-LINYAH 28 TABLET: 0.25-35 | 28 days supply | Qty: 28 | Fill #0

## 2018-02-17 MED FILL — AMLODIPINE BESYLATE 5 MG TA: 5 | 30 days supply | Qty: 30 | Fill #1

## 2018-02-17 MED FILL — LISINOPRIL 20 MG TAB: 20 | 30 days supply | Qty: 30 | Fill #1

## 2018-02-19 DIAGNOSIS — M542 Cervicalgia: Secondary | ICD-10-CM | POA: Diagnosis not present

## 2018-02-19 DIAGNOSIS — M545 Low back pain: Secondary | ICD-10-CM | POA: Diagnosis not present

## 2018-02-26 DIAGNOSIS — M542 Cervicalgia: Secondary | ICD-10-CM | POA: Diagnosis not present

## 2018-02-26 DIAGNOSIS — M545 Low back pain: Secondary | ICD-10-CM | POA: Diagnosis not present

## 2018-03-05 DIAGNOSIS — M545 Low back pain: Secondary | ICD-10-CM | POA: Diagnosis not present

## 2018-03-05 DIAGNOSIS — M542 Cervicalgia: Secondary | ICD-10-CM | POA: Diagnosis not present

## 2018-03-10 MED FILL — MONO-LINYAH 28 TABLET: 0.25-35 | 28 days supply | Qty: 28 | Fill #1

## 2018-03-18 MED FILL — SUMATRIPTAN SUCC 50 MG TAB: 50 | 10 days supply | Qty: 3 | Fill #3

## 2018-03-18 MED FILL — AMLODIPINE BESYLATE 5 MG TA: 5 | 30 days supply | Qty: 30 | Fill #2

## 2018-03-18 MED FILL — LISINOPRIL 20 MG TAB: 20 | 30 days supply | Qty: 30 | Fill #2

## 2018-03-25 DIAGNOSIS — M545 Low back pain: Secondary | ICD-10-CM | POA: Diagnosis not present

## 2018-03-25 DIAGNOSIS — M542 Cervicalgia: Secondary | ICD-10-CM | POA: Diagnosis not present

## 2018-04-03 ENCOUNTER — Ambulatory Visit: Payer: BLUE CROSS/BLUE SHIELD | Attending: Family Medicine | Admitting: Family Medicine

## 2018-04-03 ENCOUNTER — Encounter: Payer: Self-pay | Admitting: Family Medicine

## 2018-04-03 VITALS — BP 119/78 | HR 73 | Temp 98.2°F | Ht 64.0 in | Wt 213.0 lb

## 2018-04-03 DIAGNOSIS — Z79899 Other long term (current) drug therapy: Secondary | ICD-10-CM | POA: Insufficient documentation

## 2018-04-03 DIAGNOSIS — G43009 Migraine without aura, not intractable, without status migrainosus: Secondary | ICD-10-CM

## 2018-04-03 DIAGNOSIS — Z6836 Body mass index (BMI) 36.0-36.9, adult: Secondary | ICD-10-CM | POA: Insufficient documentation

## 2018-04-03 DIAGNOSIS — M1990 Unspecified osteoarthritis, unspecified site: Secondary | ICD-10-CM | POA: Insufficient documentation

## 2018-04-03 DIAGNOSIS — B351 Tinea unguium: Secondary | ICD-10-CM

## 2018-04-03 DIAGNOSIS — E669 Obesity, unspecified: Secondary | ICD-10-CM | POA: Insufficient documentation

## 2018-04-03 DIAGNOSIS — Z7951 Long term (current) use of inhaled steroids: Secondary | ICD-10-CM | POA: Insufficient documentation

## 2018-04-03 DIAGNOSIS — Z7982 Long term (current) use of aspirin: Secondary | ICD-10-CM | POA: Insufficient documentation

## 2018-04-03 DIAGNOSIS — I10 Essential (primary) hypertension: Secondary | ICD-10-CM

## 2018-04-03 MED ORDER — SUMATRIPTAN SUCCINATE 50 MG PO TABS: 100.0000 mg | ORAL_TABLET | Freq: Once | ORAL | 2 refills | Status: DC

## 2018-04-03 MED ORDER — TERBINAFINE HCL 250 MG PO TABS: 250.0000 mg | ORAL_TABLET | Freq: Every day | ORAL | 1 refills | Status: DC

## 2018-04-03 MED FILL — TERBINAFINE HCL 250 MG TAB: 250 | 30 days supply | Qty: 30 | Fill #0

## 2018-04-03 NOTE — Patient Instructions (Signed)

## 2018-04-03 NOTE — Progress Notes (Signed)
Subjective:  Patient ID: Barbara Dunn, female    DOB: 1971-03-06  Age: 47 y.o. MRN: [id number]  CC: Hypertension and Urinary Frequency   HPI Barbara Dunn is a  47 year old female with a history of hypertension, migraine, obesity who presents today for a follow up visit. She is s/p left wrist arthroscopy, joint debridement and repair on 10/28/17 by Dr. Caralyn Guile due to a ruptured tendon in her left wrist reports improvement in symptoms and is undergoing PT with plans to return to work on 04/14/18. She complains of discoloration of her big toe nail bilaterally but denies pain. Doing well on her antihypertensive and her migraines are controlled on her current regimen.  Past Medical History:  Diagnosis Date  . Anemia   . Arthritis   . Hypertension   . Migraines   . Obesity     Past Surgical History:  Procedure Laterality Date  . CARDIAC CATHETERIZATION N/A 08/02/2015   Procedure: Left Heart Cath and Coronary Angiography;  Surgeon: Troy Sine, MD;  Location: [facility];  Service: Cardiovascular;  Laterality: N/A;  . CERVIX LESION DESTRUCTION    . TUBAL LIGATION    . TUBAL LIGATION      No Known Allergies   Outpatient Medications Prior to Visit  Medication Sig Dispense Refill  . amLODipine (NORVASC) 5 MG tablet Take 1 tablet (5 mg total) by mouth daily. 30 tablet 5  . aspirin 325 MG tablet Take 325 mg by mouth daily.     Marland Kitchen BIOTIN PO Take 2 tablets by mouth daily.     . cetirizine (ZYRTEC) 10 MG tablet Take 10 mg by mouth daily.    . diphenhydrAMINE (BENADRYL) 25 mg capsule Take 25 mg by mouth every 6 (six) hours as needed for itching. Reported on 01/23/2016    . docusate sodium (COLACE) 100 MG capsule Take 100 mg by mouth 2 (two) times daily.    . fluticasone (FLONASE) 50 MCG/ACT nasal spray PLACE 2 SPRAYS INTO BOTH NOSTRILS DAILY. 16 g 2  . lisinopril (PRINIVIL,ZESTRIL) 20 MG tablet Take 1 tablet (20 mg total) by mouth daily. 30 tablet 5  . MONO-LINYAH 0.25-35 MG-MCG tablet  TAKE 1 TABLET BY MOUTH DAILY. 28 tablet 11  . Multiple Vitamins-Minerals (MULTIVITAMIN ADULT PO) Take 1 tablet by mouth daily.     Marland Kitchen nystatin (NYSTATIN) powder Apply topically 2 (two) times daily. 45 g 1  . VENTOLIN HFA 108 (90 Base) MCG/ACT inhaler INHALE 1 PUFF INTO THE LUNGS EVERY 6 HOURS AS NEEDED FOR WHEEZING OR SHORTNESS OF BREATH. 18 g 1  . acetaminophen-codeine (TYLENOL #3) 300-30 MG tablet Take 1-2 tablets by mouth every 6 (six) hours as needed for moderate pain. (Patient not taking: Reported on 09/03/2017) 10 tablet 0  . meclizine (ANTIVERT) 25 MG tablet Take 1 tablet (25 mg total) by mouth 3 (three) times daily as needed for dizziness. (Patient not taking: Reported on 04/03/2018) 60 tablet 1  . terbinafine (LAMISIL AT) 1 % cream Apply 1 application topically 2 (two) times daily. (Patient not taking: Reported on 04/03/2018) 30 g 1  . SUMAtriptan (IMITREX) 50 MG tablet Take 2 tablets (100 mg total) by mouth once for 1 dose. May repeat in 2 hours if headache persists, max 200mg /day 10 tablet 2   No facility-administered medications prior to visit.     ROS Review of Systems  Constitutional: Negative for activity change, appetite change and fatigue.  HENT: Negative for congestion, sinus pressure and sore throat.  Eyes: Negative for visual disturbance.  Respiratory: Negative for cough, chest tightness, shortness of breath and wheezing.   Cardiovascular: Negative for chest pain and palpitations.  Gastrointestinal: Negative for abdominal distention, abdominal pain and constipation.  Endocrine: Negative for polydipsia.  Genitourinary: Negative for dysuria and frequency.  Musculoskeletal: Negative for arthralgias and back pain.  Skin: Negative for rash.  Neurological: Negative for tremors, light-headedness and numbness.  Hematological: Does not bruise/bleed easily.  Psychiatric/Behavioral: Negative for agitation and behavioral problems.    Objective:  BP 119/78   Pulse 73   Temp 98.2  F (36.8 C) (Oral)   Ht 5\' 4"  (1.626 m)   Wt 213 lb (96.6 kg)   SpO2 100%   BMI 36.56 kg/m   BP/Weight 04/03/2018 6/31/4970 11/28/3783  Systolic BP 885 027 741  Diastolic BP 78 91 72  Wt. (Lbs) 213 217.6 -  BMI 36.56 37.35 38.09      Physical Exam  Constitutional: She is oriented to person, place, and time. She appears well-developed and well-nourished.  Cardiovascular: Normal rate, normal heart sounds and intact distal pulses.  No murmur heard. Pulmonary/Chest: Effort normal and breath sounds normal. She has no wheezes. She has no rales. She exhibits no tenderness.  Abdominal: Soft. Bowel sounds are normal. She exhibits no distension and no mass. There is no tenderness.  Musculoskeletal: Normal range of motion.  Neurological: She is alert and oriented to person, place, and time.  Skin: Skin is warm and dry.  Onychomycosis of b/l big toenails with splitting of right big toenail. No TTP  Psychiatric: She has a normal mood and affect.     Assessment & Plan:   1. Migraine without aura and without status migrainosus, not intractable Controlled - SUMAtriptan (IMITREX) 50 MG tablet; Take 2 tablets (100 mg total) by mouth once for 1 dose. May repeat in 2 hours if headache persists, max 200mg /day  Dispense: 10 tablet; Refill: 2  2. Onychomycosis Splitting of right toenail - referred to Podiatry - terbinafine (LAMISIL) 250 MG tablet; Take 1 tablet (250 mg total) by mouth daily.  Dispense: 30 tablet; Refill: 1 - Ambulatory referral to Podiatry  3. Essential hypertension Controlled Counseled on blood pressure goal of less than 130/80, low-sodium, DASH diet, medication compliance, 150 minutes of moderate intensity exercise per week. Discussed medication compliance, adverse effects. Continue Amlodipine  Meds ordered this encounter  Medications  . terbinafine (LAMISIL) 250 MG tablet    Sig: Take 1 tablet (250 mg total) by mouth daily.    Dispense:  30 tablet    Refill:  1  .  SUMAtriptan (IMITREX) 50 MG tablet    Sig: Take 2 tablets (100 mg total) by mouth once for 1 dose. May repeat in 2 hours if headache persists, max 200mg /day    Dispense:  10 tablet    Refill:  2    Follow-up: Return in about 3 months (around 07/04/2018) for Follow-up of chronic medical conditions.   Charlott Rakes MD

## 2018-04-07 MED FILL — MONO-LINYAH 28 TABLET: 0.25-35 | 28 days supply | Qty: 28 | Fill #2

## 2018-04-14 MED FILL — AMLODIPINE BESYLATE 5 MG TA: 5 | 30 days supply | Qty: 30 | Fill #3

## 2018-04-14 MED FILL — LISINOPRIL 20 MG TAB: 20 | 30 days supply | Qty: 30 | Fill #3

## 2018-04-14 MED FILL — FLUTICASONE PROP 50 MCG SPR: 50 | 30 days supply | Qty: 16 | Fill #1

## 2018-04-18 ENCOUNTER — Ambulatory Visit: Payer: BLUE CROSS/BLUE SHIELD | Admitting: Podiatry

## 2018-05-05 MED FILL — MONO-LINYAH 28 TABLET: 0.25-35 | 28 days supply | Qty: 28 | Fill #3

## 2018-05-09 ENCOUNTER — Ambulatory Visit: Payer: BLUE CROSS/BLUE SHIELD | Admitting: Podiatry

## 2018-05-19 MED FILL — AMLODIPINE BESYLATE 5 MG TA: 5 | 30 days supply | Qty: 30 | Fill #4

## 2018-05-19 MED FILL — LISINOPRIL 20 MG TAB: 20 | 30 days supply | Qty: 30 | Fill #4

## 2018-05-23 ENCOUNTER — Ambulatory Visit: Payer: BLUE CROSS/BLUE SHIELD | Admitting: Podiatry

## 2018-06-03 MED FILL — MONO-LINYAH 28 TABLET: 0.25-35 | 28 days supply | Qty: 28 | Fill #4

## 2018-06-05 ENCOUNTER — Encounter: Payer: Self-pay | Admitting: Podiatry

## 2018-06-05 ENCOUNTER — Ambulatory Visit: Payer: BLUE CROSS/BLUE SHIELD | Admitting: Podiatry

## 2018-06-05 VITALS — BP 131/80 | HR 76

## 2018-06-05 DIAGNOSIS — B351 Tinea unguium: Secondary | ICD-10-CM | POA: Diagnosis not present

## 2018-06-05 DIAGNOSIS — B353 Tinea pedis: Secondary | ICD-10-CM | POA: Diagnosis not present

## 2018-06-05 DIAGNOSIS — Z79899 Other long term (current) drug therapy: Secondary | ICD-10-CM

## 2018-06-05 MED ORDER — TERBINAFINE HCL 250 MG PO TABS: 250.0000 mg | ORAL_TABLET | Freq: Every day | ORAL | 0 refills | Status: AC

## 2018-06-05 MED FILL — TERBINAFINE HCL 250 MG TAB: 250 | 30 days supply | Qty: 30 | Fill #0

## 2018-06-24 ENCOUNTER — Other Ambulatory Visit: Payer: Self-pay | Admitting: Family Medicine

## 2018-06-24 DIAGNOSIS — I10 Essential (primary) hypertension: Secondary | ICD-10-CM

## 2018-06-24 MED FILL — AMLODIPINE BESYLATE 5 MG TA: 5 | 30 days supply | Qty: 30 | Fill #5

## 2018-06-24 MED FILL — LISINOPRIL 20 MG TAB: 20 | 30 days supply | Qty: 30 | Fill #5

## 2018-06-29 NOTE — Progress Notes (Signed)
Subjective:  Patient ID: Barbara Dunn, female    DOB: 02-15-1971,  MRN: [id number]  Chief Complaint  Patient presents with  . Nail Problem    bilateral great toenails have fungus - has tried oral and topical lamisil. Left seems to have cleared more than right    47 y.o. female presents with the above complaint. Reports problems with her toenails being discolored and thickened.   Review of Systems: Negative except as noted in the HPI. Denies N/V/F/Ch.  Past Medical History:  Diagnosis Date  . Anemia   . Arthritis   . Hypertension   . Migraines   . Obesity     Current Outpatient Medications:  .  acetaminophen-codeine (TYLENOL #3) 300-30 MG tablet, Take 1-2 tablets by mouth every 6 (six) hours as needed for moderate pain. (Patient not taking: Reported on 09/03/2017), Disp: 10 tablet, Rfl: 0 .  amLODipine (NORVASC) 5 MG tablet, TAKE 1 TABLET BY MOUTH DAILY., Disp: 30 tablet, Rfl: 2 .  aspirin 325 MG tablet, Take 325 mg by mouth daily. , Disp: , Rfl:  .  BIOTIN PO, Take 2 tablets by mouth daily. , Disp: , Rfl:  .  cetirizine (ZYRTEC) 10 MG tablet, Take 10 mg by mouth daily., Disp: , Rfl:  .  chlorhexidine (PERIDEX) 0.12 % solution, chlorhexidine gluconate 0.12 % mouthwash, Disp: , Rfl:  .  diphenhydrAMINE (BENADRYL) 25 mg capsule, Take 25 mg by mouth every 6 (six) hours as needed for itching. Reported on 01/23/2016, Disp: , Rfl:  .  docusate sodium (COLACE) 100 MG capsule, Take 100 mg by mouth 2 (two) times daily., Disp: , Rfl:  .  fluticasone (FLONASE) 50 MCG/ACT nasal spray, PLACE 2 SPRAYS INTO BOTH NOSTRILS DAILY., Disp: 16 g, Rfl: 2 .  lisinopril (PRINIVIL,ZESTRIL) 20 MG tablet, TAKE 1 TABLET BY MOUTH DAILY., Disp: 30 tablet, Rfl: 2 .  meclizine (ANTIVERT) 25 MG tablet, Take 1 tablet (25 mg total) by mouth 3 (three) times daily as needed for dizziness. (Patient not taking: Reported on 04/03/2018), Disp: 60 tablet, Rfl: 1 .  MONO-LINYAH 0.25-35 MG-MCG tablet, TAKE 1 TABLET BY MOUTH  DAILY., Disp: 28 tablet, Rfl: 11 .  Multiple Vitamins-Minerals (MULTIVITAMIN ADULT PO), Take 1 tablet by mouth daily. , Disp: , Rfl:  .  nystatin (NYSTATIN) powder, Apply topically 2 (two) times daily., Disp: 45 g, Rfl: 1 .  ondansetron (ZOFRAN) 4 MG tablet, ondansetron HCl 4 mg tablet, Disp: , Rfl:  .  oxyCODONE-acetaminophen (PERCOCET/ROXICET) 5-325 MG tablet, oxycodone-acetaminophen 5 mg-325 mg tablet, Disp: , Rfl:  .  SUMAtriptan (IMITREX) 50 MG tablet, Take 2 tablets (100 mg total) by mouth once for 1 dose. May repeat in 2 hours if headache persists, max 200mg /day, Disp: 10 tablet, Rfl: 2 .  terbinafine (LAMISIL AT) 1 % cream, Apply 1 application topically 2 (two) times daily. (Patient not taking: Reported on 04/03/2018), Disp: 30 g, Rfl: 1 .  terbinafine (LAMISIL) 250 MG tablet, Take 1 tablet (250 mg total) by mouth daily., Disp: 30 tablet, Rfl: 0 .  VENTOLIN HFA 108 (90 Base) MCG/ACT inhaler, INHALE 1 PUFF INTO THE LUNGS EVERY 6 HOURS AS NEEDED FOR WHEEZING OR SHORTNESS OF BREATH., Disp: 18 g, Rfl: 1  Social History   Tobacco Use  Smoking Status Former Smoker  . Last attempt to quit: 12/15/2003  . Years since quitting: 14.5  Smokeless Tobacco Never Used  Tobacco Comment   quit 10 years ago    No Known Allergies Objective:   Vitals:  06/05/18 1418  BP: 131/80  Pulse: 76   There is no height or weight on file to calculate BMI. Constitutional Well developed. Well nourished.  Vascular Dorsalis pedis pulses palpable bilaterally. Posterior tibial pulses palpable bilaterally. Capillary refill normal to all digits.  No cyanosis or clubbing noted. Pedal hair growth normal.  Neurologic Normal speech. Oriented to person, place, and time. Epicritic sensation to light touch grossly present bilaterally.  Dermatologic Nails with thickening, dystrophy, crumbly texture. No open wounds. No skin lesions.  Orthopedic: Normal joint ROM without pain or crepitus bilaterally. No visible  deformities. No bony tenderness.   Radiographs: None Assessment:   1. Toenail fungus   2. Encounter for long-term current use of high risk medication   3. Tinea pedis of both feet    Plan:  Patient was evaluated and treated and all questions answered.  Onychomycosis -Educated on etiology of nail fungus. -Baseline liver function studies ordered. Will d/c terbinafine if elevated during therapy. -eRx for oral terbinafine #30. Educated on risks and benefits of the medication.  Return in about 5 weeks (around 07/10/2018) for Nail Fungus.

## 2018-06-30 MED FILL — MONO-LINYAH 28 TABLET: 0.25-35 | 28 days supply | Qty: 28 | Fill #5

## 2018-07-10 ENCOUNTER — Other Ambulatory Visit: Payer: Self-pay

## 2018-07-10 ENCOUNTER — Ambulatory Visit: Payer: BLUE CROSS/BLUE SHIELD | Attending: Family Medicine | Admitting: Family Medicine

## 2018-07-10 ENCOUNTER — Ambulatory Visit: Payer: BLUE CROSS/BLUE SHIELD | Admitting: Podiatry

## 2018-07-10 ENCOUNTER — Telehealth: Payer: Self-pay | Admitting: Family Medicine

## 2018-07-10 ENCOUNTER — Encounter: Payer: Self-pay | Admitting: Family Medicine

## 2018-07-10 VITALS — BP 136/84 | HR 77 | Temp 98.1°F | Ht 64.0 in | Wt 215.8 lb

## 2018-07-10 DIAGNOSIS — L0292 Furuncle, unspecified: Secondary | ICD-10-CM | POA: Diagnosis not present

## 2018-07-10 DIAGNOSIS — Z79899 Other long term (current) drug therapy: Secondary | ICD-10-CM | POA: Diagnosis not present

## 2018-07-10 DIAGNOSIS — G43009 Migraine without aura, not intractable, without status migrainosus: Secondary | ICD-10-CM | POA: Diagnosis not present

## 2018-07-10 DIAGNOSIS — E669 Obesity, unspecified: Secondary | ICD-10-CM | POA: Diagnosis not present

## 2018-07-10 DIAGNOSIS — R42 Dizziness and giddiness: Secondary | ICD-10-CM | POA: Diagnosis not present

## 2018-07-10 DIAGNOSIS — I10 Essential (primary) hypertension: Secondary | ICD-10-CM

## 2018-07-10 DIAGNOSIS — Z7982 Long term (current) use of aspirin: Secondary | ICD-10-CM | POA: Insufficient documentation

## 2018-07-10 DIAGNOSIS — I499 Cardiac arrhythmia, unspecified: Secondary | ICD-10-CM | POA: Diagnosis not present

## 2018-07-10 DIAGNOSIS — Z1231 Encounter for screening mammogram for malignant neoplasm of breast: Secondary | ICD-10-CM

## 2018-07-10 DIAGNOSIS — M1990 Unspecified osteoarthritis, unspecified site: Secondary | ICD-10-CM | POA: Diagnosis not present

## 2018-07-10 DIAGNOSIS — Z1239 Encounter for other screening for malignant neoplasm of breast: Secondary | ICD-10-CM

## 2018-07-10 MED ORDER — CEPHALEXIN 500 MG PO CAPS: 500.0000 mg | ORAL_CAPSULE | Freq: Two times a day (BID) | ORAL | 0 refills | Status: DC

## 2018-07-10 MED ORDER — LISINOPRIL 20 MG PO TABS: 20.0000 mg | ORAL_TABLET | Freq: Every day | ORAL | 6 refills | Status: DC

## 2018-07-10 MED ORDER — SUMATRIPTAN SUCCINATE 50 MG PO TABS: 100.0000 mg | ORAL_TABLET | Freq: Once | ORAL | 2 refills | Status: DC

## 2018-07-10 MED ORDER — AMLODIPINE BESYLATE 5 MG PO TABS: 5.0000 mg | ORAL_TABLET | Freq: Every day | ORAL | 6 refills | Status: DC

## 2018-07-10 MED ORDER — MECLIZINE HCL 25 MG PO TABS: 25.0000 mg | ORAL_TABLET | Freq: Three times a day (TID) | ORAL | 1 refills | Status: DC | PRN

## 2018-07-10 MED ORDER — CEPHALEXIN 500 MG PO CAPS: 500.0000 mg | ORAL_CAPSULE | Freq: Four times a day (QID) | ORAL | 0 refills | Status: DC

## 2018-07-10 MED FILL — SUMAtriptan SUCCINATE 50 MG: 50 | 30 days supply | Qty: 9 | Fill #0

## 2018-07-10 MED FILL — MECLIZINE 25 MG TABLET: 25 | 20 days supply | Qty: 60 | Fill #0

## 2018-07-10 MED FILL — CEPHALEXIN 500 MG CAPSULE: 500 | 7 days supply | Qty: 14 | Fill #0

## 2018-07-10 NOTE — Progress Notes (Signed)
Subjective:  Patient ID: Barbara Dunn, female    DOB: 05/15/1971  Age: 47 y.o. MRN: [id number]  CC: Hypertension   HPI Barbara Dunn is a  47 year old female with a history of hypertension, migraine, obesity who presents today for a follow up visit. Her migraines have been controlled and she endorses doing well on her antihypertensive with no complaints of side effects.  She has her blood pressure log with her and values have been controlled at home. She has noticed irregular heartbeats and has kept a log of her symptoms which have occurred in about 2 to 3 weeks interval and occur spontaneously with no precipitating event.  Denies chest pains, shortness of breath at that time and denies presence of anxiety.  She has also noticed a painful bump in the inferior aspect of her right breast with no drainage, discharge fever.  Her last mammogram was in 01/2016 and was negative for malignancy.  Past Medical History:  Diagnosis Date  . Anemia   . Arthritis   . Hypertension   . Migraines   . Obesity     Past Surgical History:  Procedure Laterality Date  . CARDIAC CATHETERIZATION N/A 08/02/2015   Procedure: Left Heart Cath and Coronary Angiography;  Surgeon: Troy Sine, MD;  Location: [facility];  Service: Cardiovascular;  Laterality: N/A;  . CERVIX LESION DESTRUCTION    . TUBAL LIGATION    . TUBAL LIGATION      No Known Allergies   Outpatient Medications Prior to Visit  Medication Sig Dispense Refill  . aspirin 325 MG tablet Take 325 mg by mouth daily.     Marland Kitchen BIOTIN PO Take 2 tablets by mouth daily.     . cetirizine (ZYRTEC) 10 MG tablet Take 10 mg by mouth daily.    . chlorhexidine (PERIDEX) 0.12 % solution chlorhexidine gluconate 0.12 % mouthwash    . diphenhydrAMINE (BENADRYL) 25 mg capsule Take 25 mg by mouth every 6 (six) hours as needed for itching. Reported on 01/23/2016    . docusate sodium (COLACE) 100 MG capsule Take 100 mg by mouth 2 (two) times daily.    .  fluticasone (FLONASE) 50 MCG/ACT nasal spray PLACE 2 SPRAYS INTO BOTH NOSTRILS DAILY. 16 g 2  . MONO-LINYAH 0.25-35 MG-MCG tablet TAKE 1 TABLET BY MOUTH DAILY. 28 tablet 11  . Multiple Vitamins-Minerals (MULTIVITAMIN ADULT PO) Take 1 tablet by mouth daily.     Marland Kitchen nystatin (NYSTATIN) powder Apply topically 2 (two) times daily. 45 g 1  . ondansetron (ZOFRAN) 4 MG tablet ondansetron HCl 4 mg tablet    . VENTOLIN HFA 108 (90 Base) MCG/ACT inhaler INHALE 1 PUFF INTO THE LUNGS EVERY 6 HOURS AS NEEDED FOR WHEEZING OR SHORTNESS OF BREATH. 18 g 1  . amLODipine (NORVASC) 5 MG tablet TAKE 1 TABLET BY MOUTH DAILY. 30 tablet 2  . lisinopril (PRINIVIL,ZESTRIL) 20 MG tablet TAKE 1 TABLET BY MOUTH DAILY. 30 tablet 2  . meclizine (ANTIVERT) 25 MG tablet Take 1 tablet (25 mg total) by mouth 3 (three) times daily as needed for dizziness. 60 tablet 1  . acetaminophen-codeine (TYLENOL #3) 300-30 MG tablet Take 1-2 tablets by mouth every 6 (six) hours as needed for moderate pain. (Patient not taking: Reported on 09/03/2017) 10 tablet 0  . oxyCODONE-acetaminophen (PERCOCET/ROXICET) 5-325 MG tablet oxycodone-acetaminophen 5 mg-325 mg tablet    . terbinafine (LAMISIL AT) 1 % cream Apply 1 application topically 2 (two) times daily. (Patient not taking: Reported on 04/03/2018)  30 g 1  . SUMAtriptan (IMITREX) 50 MG tablet Take 2 tablets (100 mg total) by mouth once for 1 dose. May repeat in 2 hours if headache persists, max 227m/day 10 tablet 2   No facility-administered medications prior to visit.     ROS Review of Systems  Constitutional: Negative for activity change, appetite change and fatigue.  HENT: Negative for congestion, sinus pressure and sore throat.   Eyes: Negative for visual disturbance.  Respiratory: Negative for cough, chest tightness, shortness of breath and wheezing.   Cardiovascular: Negative for chest pain and palpitations.  Gastrointestinal: Negative for abdominal distention, abdominal pain and  constipation.  Endocrine: Negative for polydipsia.  Genitourinary: Negative for dysuria and frequency.  Musculoskeletal: Negative for arthralgias and back pain.  Skin: Positive for rash.  Neurological: Negative for tremors, light-headedness and numbness.  Hematological: Does not bruise/bleed easily.  Psychiatric/Behavioral: Negative for agitation and behavioral problems.    Objective:  BP 136/84   Pulse 77   Temp 98.1 F (36.7 C) (Oral)   Ht 5' 4"  (1.626 m)   Wt 215 lb 12.8 oz (97.9 kg)   SpO2 100%   BMI 37.04 kg/m   BP/Weight 07/10/2018 06/05/2018 66/28/3662 Systolic BP [id number] Diastolic BP 84 80 78  Wt. (Lbs) 215.8 - 213  BMI 37.04 - 36.56      Physical Exam  Constitutional: She is oriented to person, place, and time. She appears well-developed and well-nourished.  Cardiovascular: Normal rate, normal heart sounds and intact distal pulses.  No murmur heard. Pulmonary/Chest: Effort normal and breath sounds normal. She has no wheezes. She has no rales. She exhibits no tenderness. Right breast exhibits mass (superficial boil on inferior aspect of R breast at 6 o'clock, no discharge noted). Left breast exhibits no mass.  Abdominal: Soft. Bowel sounds are normal. She exhibits no distension and no mass. There is no tenderness.  Musculoskeletal: Normal range of motion.  Neurological: She is alert and oriented to person, place, and time.  Skin: Skin is warm and dry.  Psychiatric: She has a normal mood and affect.     Assessment & Plan:   1. Essential hypertension Controlled Counseled on blood pressure goal of less than 130/80, low-sodium, DASH diet, medication compliance, 150 minutes of moderate intensity exercise per week. Discussed medication compliance, adverse effects. - CMP14+EGFR - amLODipine (NORVASC) 5 MG tablet; Take 1 tablet (5 mg total) by mouth daily.  Dispense: 30 tablet; Refill: 6 - lisinopril (PRINIVIL,ZESTRIL) 20 MG tablet; Take 1 tablet (20 mg total) by  mouth daily.  Dispense: 30 tablet; Refill: 6  2. Dizziness Uncontrolled due to running out of meclizine which have refilled - meclizine (ANTIVERT) 25 MG tablet; Take 1 tablet (25 mg total) by mouth 3 (three) times daily as needed for dizziness.  Dispense: 60 tablet; Refill: 1  3. Migraine without aura and without status migrainosus, not intractable Controlled - SUMAtriptan (IMITREX) 50 MG tablet; Take 2 tablets (100 mg total) by mouth once for 1 dose. May repeat in 2 hours if headache persists, max 2038mday  Dispense: 10 tablet; Refill: 2  4. Irregular heart beats EKG unremarkable She might need to wear an event monitor - T4, free - TSH - Ambulatory referral to Cardiology  5. Screening for breast cancer - MM Digital Screening; Future  6. Furuncle Placed on Keflex  Meds ordered this encounter  Medications  . amLODipine (NORVASC) 5 MG tablet    Sig: Take 1 tablet (5 mg total) by  mouth daily.    Dispense:  30 tablet    Refill:  6  . lisinopril (PRINIVIL,ZESTRIL) 20 MG tablet    Sig: Take 1 tablet (20 mg total) by mouth daily.    Dispense:  30 tablet    Refill:  6  . meclizine (ANTIVERT) 25 MG tablet    Sig: Take 1 tablet (25 mg total) by mouth 3 (three) times daily as needed for dizziness.    Dispense:  60 tablet    Refill:  1  . SUMAtriptan (IMITREX) 50 MG tablet    Sig: Take 2 tablets (100 mg total) by mouth once for 1 dose. May repeat in 2 hours if headache persists, max 254m/day    Dispense:  10 tablet    Refill:  2  . cephALEXin (KEFLEX) 500 MG capsule    Sig: Take 1 capsule (500 mg total) by mouth 4 (four) times daily.    Dispense:  14 capsule    Refill:  0    Follow-up: Return in about 3 months (around 10/09/2018) for PAP smear.   ECharlott RakesMD

## 2018-07-10 NOTE — Progress Notes (Signed)
Patient has been having dizzy spells.  Patient has bump under breast.

## 2018-07-11 LAB — CMP14+EGFR
ALBUMIN: 4.1 g/dL (ref 3.5–5.5)
ALK PHOS: 51 IU/L (ref 39–117)
ALT: 13 IU/L (ref 0–32)
AST: 18 IU/L (ref 0–40)
Albumin/Globulin Ratio: 1.6 (ref 1.2–2.2)
BILIRUBIN TOTAL: 0.4 mg/dL (ref 0.0–1.2)
BUN / CREAT RATIO: 10 (ref 9–23)
BUN: 8 mg/dL (ref 6–24)
CHLORIDE: 102 mmol/L (ref 96–106)
CO2: 24 mmol/L (ref 20–29)
Calcium: 8.8 mg/dL (ref 8.7–10.2)
Creatinine, Ser: 0.79 mg/dL (ref 0.57–1.00)
GFR calc Af Amer: 105 mL/min/{1.73_m2} (ref >59)
GFR calc non Af Amer: 91 mL/min/{1.73_m2} (ref >59)
GLOBULIN, TOTAL: 2.6 g/dL (ref 1.5–4.5)
Glucose: 77 mg/dL (ref 65–99)
Potassium: 4 mmol/L (ref 3.5–5.2)
SODIUM: 142 mmol/L (ref 134–144)
TOTAL PROTEIN: 6.7 g/dL (ref 6.0–8.5)

## 2018-07-11 LAB — T4, FREE: FREE T4: 1.13 ng/dL (ref 0.82–1.77)

## 2018-07-11 LAB — TSH: TSH: 0.919 u[IU]/mL (ref 0.450–4.500)

## 2018-07-17 ENCOUNTER — Ambulatory Visit: Payer: BLUE CROSS/BLUE SHIELD | Admitting: Podiatry

## 2018-07-21 MED FILL — LISINOPRIL 20 MG TAB: 20 | 30 days supply | Qty: 30 | Fill #0

## 2018-07-21 MED FILL — AMLODIPINE BESYLATE 5 MG TA: 5 | 30 days supply | Qty: 30 | Fill #0

## 2018-07-24 MED FILL — SUMAtriptan SUCCINATE 50 MG: 50 | 30 days supply | Qty: 9 | Fill #0

## 2018-07-28 MED FILL — MONO-LINYAH 28 TABLET: 0.25-35 | 28 days supply | Qty: 28 | Fill #6

## 2018-07-30 ENCOUNTER — Ambulatory Visit: Payer: BLUE CROSS/BLUE SHIELD | Attending: Family Medicine | Admitting: Family Medicine

## 2018-07-30 ENCOUNTER — Other Ambulatory Visit: Payer: Self-pay | Admitting: Family Medicine

## 2018-07-30 ENCOUNTER — Encounter: Payer: Self-pay | Admitting: Family Medicine

## 2018-07-30 VITALS — BP 136/86 | HR 66 | Temp 98.2°F | Ht 64.0 in | Wt 217.4 lb

## 2018-07-30 DIAGNOSIS — Z7982 Long term (current) use of aspirin: Secondary | ICD-10-CM | POA: Diagnosis not present

## 2018-07-30 DIAGNOSIS — E669 Obesity, unspecified: Secondary | ICD-10-CM | POA: Insufficient documentation

## 2018-07-30 DIAGNOSIS — B3731 Acute candidiasis of vulva and vagina: Secondary | ICD-10-CM

## 2018-07-30 DIAGNOSIS — B373 Candidiasis of vulva and vagina: Secondary | ICD-10-CM | POA: Insufficient documentation

## 2018-07-30 DIAGNOSIS — Z79899 Other long term (current) drug therapy: Secondary | ICD-10-CM | POA: Insufficient documentation

## 2018-07-30 DIAGNOSIS — Z6837 Body mass index (BMI) 37.0-37.9, adult: Secondary | ICD-10-CM | POA: Diagnosis not present

## 2018-07-30 DIAGNOSIS — I10 Essential (primary) hypertension: Secondary | ICD-10-CM | POA: Diagnosis not present

## 2018-07-30 DIAGNOSIS — N611 Abscess of the breast and nipple: Secondary | ICD-10-CM | POA: Insufficient documentation

## 2018-07-30 DIAGNOSIS — G43909 Migraine, unspecified, not intractable, without status migrainosus: Secondary | ICD-10-CM | POA: Insufficient documentation

## 2018-07-30 DIAGNOSIS — M1990 Unspecified osteoarthritis, unspecified site: Secondary | ICD-10-CM | POA: Insufficient documentation

## 2018-07-30 MED ORDER — CEPHALEXIN 500 MG PO CAPS: 500.0000 mg | ORAL_CAPSULE | Freq: Two times a day (BID) | ORAL | 0 refills | Status: DC

## 2018-07-30 MED ORDER — FLUCONAZOLE 150 MG PO TABS: 150.0000 mg | ORAL_TABLET | Freq: Once | ORAL | 1 refills | Status: AC

## 2018-07-30 MED FILL — CEPHALEXIN 500 MG CAPSULE: 500 | 7 days supply | Qty: 14 | Fill #0

## 2018-07-30 MED FILL — FLUCONAZOLE 150 MG TABS: 150 | 1 days supply | Qty: 1 | Fill #0

## 2018-07-30 NOTE — Progress Notes (Signed)
Subjective:  Patient ID: Barbara Dunn, female    DOB: August 31, 1971  Age: 47 y.o. MRN: [id number]  CC: Breast Pain   HPI Emmamarie Kluender is a  47 year old female with a history of hypertension, migraine, obesity who presents today with concerns of having a yeast infection due to vaginal itching.  Denies vaginal discharge, abdominal pain, nausea or vomiting. She recently completed a course of Keflex for a right breast boil but reports the boil popped and she noticed some purulent drainage. I had referred her  for mammogram however mammogram was canceled as they had explained to her she would need a new order from her doctor. She denies presence of fever, breast pain.  Past Medical History:  Diagnosis Date  . Anemia   . Arthritis   . Hypertension   . Migraines   . Obesity     Past Surgical History:  Procedure Laterality Date  . CARDIAC CATHETERIZATION N/A 08/02/2015   Procedure: Left Heart Cath and Coronary Angiography;  Surgeon: Troy Sine, MD;  Location: [facility];  Service: Cardiovascular;  Laterality: N/A;  . CERVIX LESION DESTRUCTION    . TUBAL LIGATION    . TUBAL LIGATION      No Known Allergies   Outpatient Medications Prior to Visit  Medication Sig Dispense Refill  . amLODipine (NORVASC) 5 MG tablet Take 1 tablet (5 mg total) by mouth daily. 30 tablet 6  . aspirin 325 MG tablet Take 325 mg by mouth daily.     Marland Kitchen BIOTIN PO Take 2 tablets by mouth daily.     . cetirizine (ZYRTEC) 10 MG tablet Take 10 mg by mouth daily.    . diphenhydrAMINE (BENADRYL) 25 mg capsule Take 25 mg by mouth every 6 (six) hours as needed for itching. Reported on 01/23/2016    . fluticasone (FLONASE) 50 MCG/ACT nasal spray PLACE 2 SPRAYS INTO BOTH NOSTRILS DAILY. 16 g 2  . lisinopril (PRINIVIL,ZESTRIL) 20 MG tablet Take 1 tablet (20 mg total) by mouth daily. 30 tablet 6  . meclizine (ANTIVERT) 25 MG tablet Take 1 tablet (25 mg total) by mouth 3 (three) times daily as needed for dizziness. 60  tablet 1  . MONO-LINYAH 0.25-35 MG-MCG tablet TAKE 1 TABLET BY MOUTH DAILY. 28 tablet 11  . Multiple Vitamins-Minerals (MULTIVITAMIN ADULT PO) Take 1 tablet by mouth daily.     Marland Kitchen nystatin (NYSTATIN) powder Apply topically 2 (two) times daily. 45 g 1  . ondansetron (ZOFRAN) 4 MG tablet ondansetron HCl 4 mg tablet    . VENTOLIN HFA 108 (90 Base) MCG/ACT inhaler INHALE 1 PUFF INTO THE LUNGS EVERY 6 HOURS AS NEEDED FOR WHEEZING OR SHORTNESS OF BREATH. 18 g 1  . acetaminophen-codeine (TYLENOL #3) 300-30 MG tablet Take 1-2 tablets by mouth every 6 (six) hours as needed for moderate pain. (Patient not taking: Reported on 09/03/2017) 10 tablet 0  . chlorhexidine (PERIDEX) 0.12 % solution chlorhexidine gluconate 0.12 % mouthwash    . docusate sodium (COLACE) 100 MG capsule Take 100 mg by mouth 2 (two) times daily.    Marland Kitchen oxyCODONE-acetaminophen (PERCOCET/ROXICET) 5-325 MG tablet oxycodone-acetaminophen 5 mg-325 mg tablet    . SUMAtriptan (IMITREX) 50 MG tablet Take 2 tablets (100 mg total) by mouth once for 1 dose. May repeat in 2 hours if headache persists, max 200mg /day 10 tablet 2  . terbinafine (LAMISIL AT) 1 % cream Apply 1 application topically 2 (two) times daily. (Patient not taking: Reported on 04/03/2018) 30 g 1  .  cephALEXin (KEFLEX) 500 MG capsule Take 1 capsule (500 mg total) by mouth 2 (two) times daily. (Patient not taking: Reported on 07/30/2018) 14 capsule 0   No facility-administered medications prior to visit.     ROS Review of Systems  Constitutional: Negative for activity change, appetite change and fatigue.  HENT: Negative for congestion, sinus pressure and sore throat.   Eyes: Negative for visual disturbance.  Respiratory: Negative for cough, chest tightness, shortness of breath and wheezing.   Cardiovascular: Negative for chest pain and palpitations.  Gastrointestinal: Negative for abdominal distention, abdominal pain and constipation.  Endocrine: Negative for polydipsia.    Genitourinary: Negative for dysuria and frequency.  Musculoskeletal: Negative for arthralgias and back pain.  Skin: Negative for rash.  Neurological: Negative for tremors, light-headedness and numbness.  Hematological: Does not bruise/bleed easily.  Psychiatric/Behavioral: Negative for agitation and behavioral problems.    Objective:  BP 136/86   Pulse 66   Temp 98.2 F (36.8 C) (Oral)   Ht 5\' 4"  (1.626 m)   Wt 217 lb 6.4 oz (98.6 kg)   SpO2 100%   BMI 37.32 kg/m   BP/Weight 07/30/2018 07/10/2018 0/27/7412  Systolic BP 878 676 720  Diastolic BP 86 84 80  Wt. (Lbs) 217.4 215.8 -  BMI 37.32 37.04 -      Physical Exam  Constitutional: She is oriented to person, place, and time. She appears well-developed and well-nourished.  Cardiovascular: Normal rate, normal heart sounds and intact distal pulses.  No murmur heard. Pulmonary/Chest: Effort normal and breath sounds normal. She has no wheezes. She has no rales. She exhibits no tenderness. Right breast exhibits no mass (open sore from ruptured furuncle at 7 o'clock) and no tenderness. Left breast exhibits no mass and no tenderness.  Abdominal: Soft. Bowel sounds are normal. She exhibits no distension and no mass. There is no tenderness.  Musculoskeletal: Normal range of motion.  Neurological: She is alert and oriented to person, place, and time.      Assessment & Plan:   1. Breast abscess Pleated course of Keflex We will repeat antibiotic course again Advised to apply heat - cephALEXin (KEFLEX) 500 MG capsule; Take 1 capsule (500 mg total) by mouth 2 (two) times daily.  Dispense: 14 capsule; Refill: 0 - US BREAST LTD UNI RIGHT INC AXILLA; Future - MM DIAG BREAST TOMO BILATERAL; Future  2. Vaginal candidiasis Likely due to recently completing antibiotic - fluconazole (DIFLUCAN) 150 MG tablet; Take 1 tablet (150 mg total) by mouth once for 1 dose.  Dispense: 1 tablet; Refill: 1   Meds ordered this encounter  Medications   . cephALEXin (KEFLEX) 500 MG capsule    Sig: Take 1 capsule (500 mg total) by mouth 2 (two) times daily.    Dispense:  14 capsule    Refill:  0  . fluconazole (DIFLUCAN) 150 MG tablet    Sig: Take 1 tablet (150 mg total) by mouth once for 1 dose.    Dispense:  1 tablet    Refill:  1    Follow-up: Return for Follow-up of chronic medical conditions, keep previously scheduled appointment.   Charlott Rakes MD

## 2018-07-30 NOTE — Progress Notes (Signed)
Patient states that she has a yeast infection. 

## 2018-08-01 ENCOUNTER — Ambulatory Visit: Payer: BLUE CROSS/BLUE SHIELD | Admitting: Podiatry

## 2018-08-05 ENCOUNTER — Other Ambulatory Visit: Payer: Self-pay | Admitting: Family Medicine

## 2018-08-05 ENCOUNTER — Ambulatory Visit: Payer: Self-pay

## 2018-08-05 ENCOUNTER — Ambulatory Visit
Admission: RE | Admit: 2018-08-05 | Discharge: 2018-08-05 | Disposition: A | Discharge status: Home / Self Care | Payer: BLUE CROSS/BLUE SHIELD | Source: Ambulatory Visit | Attending: Family Medicine | Admitting: Family Medicine

## 2018-08-05 ENCOUNTER — Telehealth: Payer: Self-pay | Admitting: Family Medicine

## 2018-08-05 DIAGNOSIS — N611 Abscess of the breast and nipple: Secondary | ICD-10-CM

## 2018-08-05 DIAGNOSIS — N6489 Other specified disorders of breast: Secondary | ICD-10-CM | POA: Diagnosis not present

## 2018-08-05 DIAGNOSIS — R928 Other abnormal and inconclusive findings on diagnostic imaging of breast: Secondary | ICD-10-CM | POA: Diagnosis not present

## 2018-08-05 NOTE — Telephone Encounter (Signed)
Will route to PCP for review. 

## 2018-08-05 NOTE — Telephone Encounter (Signed)
Pt called again to request a call back from her nurse, please follow up as soon as possible -Pt uses [facility]

## 2018-08-05 NOTE — Telephone Encounter (Signed)
Patient came to the facility stating she went to the breast center and was told that she needs to be referred to the dermatologist because of the boil she has in her breast. Patient was also told that she needed more antibiotics for her breast. Please f/u with patient.

## 2018-08-06 NOTE — Telephone Encounter (Signed)
This is her second round of antibiotics prescribed on 07/30/18 back to back. Scheduled for office visit one week from completion of antibiotic

## 2018-08-07 ENCOUNTER — Telehealth: Payer: Self-pay | Admitting: Family Medicine

## 2018-08-07 NOTE — Telephone Encounter (Signed)
Patient called requesting a call back from nurse. Please follow up.  Also tried scheduling follow up appointment within one week but provider is booked up.

## 2018-08-07 NOTE — Telephone Encounter (Signed)
Patient was called and informed to follow up with PCP in one week after medication is finished.

## 2018-08-14 ENCOUNTER — Ambulatory Visit: Payer: BLUE CROSS/BLUE SHIELD | Admitting: Podiatry

## 2018-08-15 MED FILL — FLUCONAZOLE 150 MG TABS: 150 | 1 days supply | Qty: 1 | Fill #1

## 2018-08-18 MED FILL — LISINOPRIL 20 MG TAB: 20 | 30 days supply | Qty: 30 | Fill #1

## 2018-08-18 MED FILL — AMLODIPINE BESYLATE 5 MG TA: 5 | 30 days supply | Qty: 30 | Fill #1

## 2018-08-25 MED FILL — MONO-LINYAH 28 TABLET: 0.25-35 | 28 days supply | Qty: 28 | Fill #7

## 2018-09-15 MED FILL — LISINOPRIL 20 MG TAB: 20 | 30 days supply | Qty: 30 | Fill #2

## 2018-09-15 MED FILL — AMLODIPINE BESYLATE 5 MG TA: 5 | 30 days supply | Qty: 30 | Fill #2

## 2018-09-17 MED FILL — SUMATRIPTAN SUCC 50 MG TAB: 50 | 30 days supply | Qty: 9 | Fill #1

## 2018-09-22 MED FILL — MONO-LINYAH 28 TABLET: 0.25-35 | 28 days supply | Qty: 28 | Fill #8

## 2018-09-26 ENCOUNTER — Encounter: Payer: Self-pay | Admitting: Cardiology

## 2018-09-26 ENCOUNTER — Ambulatory Visit: Payer: BLUE CROSS/BLUE SHIELD | Admitting: Cardiology

## 2018-09-26 VITALS — BP 132/72 | Ht 63.0 in | Wt 219.0 lb

## 2018-09-26 DIAGNOSIS — I10 Essential (primary) hypertension: Secondary | ICD-10-CM

## 2018-09-26 DIAGNOSIS — R079 Chest pain, unspecified: Secondary | ICD-10-CM

## 2018-09-26 DIAGNOSIS — R002 Palpitations: Secondary | ICD-10-CM | POA: Diagnosis not present

## 2018-09-26 DIAGNOSIS — E6609 Other obesity due to excess calories: Secondary | ICD-10-CM

## 2018-09-26 DIAGNOSIS — Z7189 Other specified counseling: Secondary | ICD-10-CM | POA: Diagnosis not present

## 2018-09-26 DIAGNOSIS — Z6838 Body mass index (BMI) 38.0-38.9, adult: Secondary | ICD-10-CM

## 2018-09-26 NOTE — Patient Instructions (Signed)
Medication Instructions:  Your physician recommends that you continue on your current medications as directed. Please refer to the Current Medication list given to you today.  If you need a refill on your cardiac medications before your next appointment, please call your pharmacy.   Lab work: None ordered If you have labs (blood work) drawn today and your tests are completely normal, you will receive your results only by: Marland Kitchen MyChart Message (if you have MyChart) OR . A paper copy in the mail If you have any lab test that is abnormal or we need to change your treatment, we will call you to review the results.  Testing/Procedures: Your physician has requested that you have an echocardiogram. Echocardiography is a painless test that uses sound waves to create images of your heart. It provides your doctor with information about the size and shape of your heart and how well your heart's chambers and valves are working. This procedure takes approximately one hour. There are no restrictions for this procedure.  Your physician has recommended that you wear an zio-patch monitor. Zio-patch monitors are medical devices that record the heart's electrical activity. Doctors most often [place] these monitors to diagnose arrhythmias. Arrhythmias are problems with the speed or rhythm of the heartbeat. The monitor is a small, portable device. You can wear one while you do your normal daily activities. This is usually used to diagnose what is causing palpitations/syncope (passing out).      Follow-Up: At [facility], you and your health needs are our priority.  As part of our continuing mission to provide you with exceptional heart care, we have created designated Provider Care Teams.  These Care Teams include your primary Cardiologist (physician) and Advanced Practice Providers (APPs -  Physician Assistants and Nurse Practitioners) who all work together to provide you with the care you need, when you need it. You  will need a follow up appointment in 6 months.   Please call our office 2 months in advance to schedule this appointment.  You may see Dr.Christopher or one of the following Advanced Practice Providers on your designated Care Team:   Rosaria Ferries, PA-C . Jory Sims, DNP, ANP

## 2018-09-26 NOTE — Progress Notes (Signed)
Cardiology Office Note:    Date:  09/26/2018   ID:  [name], DOB Mar 25, 1971, MRN [id number]  PCP:  Charlott Rakes, MD  Cardiologist:  Buford Dresser, MD PhD  Referring MD: Charlott Rakes, MD   CC: palpitations, chest pain  History of Present Illness:    Barbara Dunn is a 47 y.o. female with a hx of hypertension, migraine, SVT seen during cath who is seen as a new consult at the request of Charlott Rakes, MD for the evaluation and management of palpitations. She has not been seen by the practice in > 3 years.  Irregular heart beat and chest discomfort (sharp pain) that last 5-10 minutes. Not related to exertion, food, position. Better with taking deep breaths. Not tender. No radiation. No associated symptoms. Had a negative cath in 2016.   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 Works about 13 hours/day and every other weekend. -Labs: TSH, kidney function/electrolytes, CBC reviewed. -Cardiac ROS: no shortness of breath, no PND, no orthopnea, no LE edema. -Family history: aunt had heart disease. Mom's side has HTN, DM as well  Past Medical History:  Diagnosis Date  . Anemia   . Arthritis   . Hypertension   . Migraines   . Obesity     Past Surgical History:  Procedure Laterality Date  . CARDIAC CATHETERIZATION N/A 08/02/2015   Procedure: Left Heart Cath and  Coronary Angiography;  Surgeon: Troy Sine, MD;  Location: [facility];  Service: Cardiovascular;  Laterality: N/A;  . CERVIX LESION DESTRUCTION    . TUBAL LIGATION    . TUBAL LIGATION      Current Medications: Current Outpatient Medications on File Prior to Visit  Medication Sig  . amLODipine (NORVASC) 5 MG tablet Take 1 tablet (5 mg total) by mouth daily.  Marland Kitchen aspirin 81 MG tablet Take 81 mg by mouth daily.   Marland Kitchen BIOTIN PO Take 2 tablets by mouth daily.   . cetirizine (ZYRTEC) 10 MG tablet Take 10 mg by mouth daily.  . diphenhydrAMINE (BENADRYL) 25 mg capsule Take 25 mg by mouth every 6 (six) hours as needed for itching. Reported on 01/23/2016  . fluticasone (FLONASE) 50 MCG/ACT nasal spray PLACE 2 SPRAYS INTO BOTH NOSTRILS DAILY.  Marland Kitchen lisinopril (PRINIVIL,ZESTRIL) 20 MG tablet Take 1 tablet (20 mg total) by mouth daily.  . meclizine (ANTIVERT) 25 MG tablet Take 1 tablet (25 mg total) by mouth 3 (three) times daily as needed for dizziness.  . Multiple Vitamins-Minerals (MULTIVITAMIN ADULT PO) Take 1 tablet by mouth daily.   Marland Kitchen nystatin (NYSTATIN) powder Apply topically 2 (two) times daily.  . SUMAtriptan (IMITREX) 50 MG tablet Take 2 tablets (100 mg total) by mouth once for 1 dose. May repeat in 2 hours if headache persists, max 200mg /day  . VENTOLIN HFA 108 (90 Base) MCG/ACT inhaler INHALE 1 PUFF INTO THE LUNGS EVERY 6 HOURS AS NEEDED FOR WHEEZING OR SHORTNESS OF BREATH.  . chlorhexidine (PERIDEX) 0.12 % solution chlorhexidine gluconate 0.12 % mouthwash  . docusate sodium (COLACE) 100 MG capsule Take 100  mg by mouth 2 (two) times daily.  Marland Kitchen MONO-LINYAH 0.25-35 MG-MCG tablet TAKE 1 TABLET BY MOUTH DAILY.   No current facility-administered medications on file prior to visit.      Allergies:   Patient has no known allergies.   Social History   Socioeconomic History  . Marital status: Single    Spouse name: Not on file  . Number of children: Not on file  . Years of education: Not  on file  . Highest education level: Not on file  Occupational History  . Not on file  Social Needs  . Financial resource strain: Not on file  . Food insecurity:    Worry: Not on file    Inability: Not on file  . Transportation needs:    Medical: Not on file    Non-medical: Not on file  Tobacco Use  . Smoking status: Former Smoker    Last attempt to quit: 12/15/2003    Years since quitting: 14.7  . Smokeless tobacco: Never Used  . Tobacco comment: quit 10 years ago  Substance and Sexual Activity  . Alcohol use: Yes    Alcohol/week: 2.0 standard drinks    Types: 1 Glasses of wine, 1 Shots of liquor per week    Comment: Once every 3 months   . Drug use: No  . Sexual activity: Yes    Birth control/protection: Other-see comments    Comment: tubal  Lifestyle  . Physical activity:    Days per week: Not on file    Minutes per session: Not on file  . Stress: Not on file  Relationships  . Social connections:    Talks on phone: Not on file    Gets together: Not on file    Attends religious service: Not on file    Active member of club or organization: Not on file    Attends meetings of clubs or organizations: Not on file    Relationship status: Not on file  Other Topics Concern  . Not on file  Social History Narrative   Pt has GED. CNA as well.      Family History: The patient's family history includes Hypertension in her mother; Schizophrenia in her brother, brother, mother, and sister. aunt had heart disease. Mom's side has HTN, DM as well  ROS:   Please see the history of present illness.  Additional pertinent ROS:  Constitutional: Negative for chills, fever, night sweats, unintentional weight loss  HENT: Negative for ear pain and hearing loss.   Eyes: Negative for loss of vision and eye pain.  Respiratory: Negative for cough, sputum, shortness of breath, wheezing.   Cardiovascular: Positive for chest pain, palpitations, Negative for  PND, orthopnea, lower extremity  edema and claudication.  Gastrointestinal: Negative for abdominal pain, melena, and hematochezia.  Genitourinary: Negative for dysuria and hematuria.  Musculoskeletal: Negative for falls and myalgias.  Skin: Negative for itching and rash.  Neurological: Negative for focal weakness, focal sensory changes and loss of consciousness.  Endo/Heme/Allergies: Does not bruise/bleed easily.    EKGs/Labs/Other Studies Reviewed:    The following studies were reviewed today: Lexiscan myoview 06/2015  The left ventricular ejection fraction is hyperdynamic (>65%).  Nuclear stress EF: 71%.  There was no ST segment deviation noted during stress.  This is a low risk study.   Low risk stress nuclear study with a small, severe, predominantly fixed apical defect likely representing apical thinning; cannot R/O minimal apical ischemia; EF 71 with normal wall motion.  Heart cath  07/2015  The left ventricular systolic function is normal.  Normal coronary arteries.  Transient recurrent episodes of SVT, catheter induced which would break with coughing.   Normal LV function without focal segmental wall motion M normalities. Normal coronary arteries.  Recommendation: Suspect nonischemic chest pain.  Medical therapy for hypertension.  The patient may benefit from beta blocker therapy with history of palpitations and demonstrated recurrent bursts of SVT easily inducible during the procedure; alternatively, adjustment from amlodipine to either Cardizem/verapamil may be considered.  EKG:  EKG is personally reviewed.  The ekg ordered today demonstrates normal sinus rhythm.  Recent Labs: 07/10/2018: ALT 13; BUN 8; Creatinine, Ser 0.79; Potassium 4.0; Sodium 142; TSH 0.919  Recent Lipid Panel    Component Value Date/Time   CHOL 185 01/01/2018 0948   TRIG 69 01/01/2018 0948   HDL 85 01/01/2018 0948   CHOLHDL 2.2 01/01/2018 0948   CHOLHDL 1.8 07/27/2016 0917   VLDL 14 07/27/2016 0917   LDLCALC 86  01/01/2018 0948    Physical Exam:    VS:  BP 132/72 (BP Location: Right Arm, Patient Position: Sitting, Cuff Size: Large)   Ht 5\' 3"  (1.6 m)   Wt 219 lb (99.3 kg)   LMP 09/20/2018   BMI 38.79 kg/m     Wt Readings from Last 3 Encounters:  09/26/18 219 lb (99.3 kg)  07/30/18 217 lb 6.4 oz (98.6 kg)  07/10/18 215 lb 12.8 oz (97.9 kg)     GEN: Well nourished, well developed in no acute distress HEENT: Normal NECK: No JVD; No carotid bruits LYMPHATICS: No lymphadenopathy CARDIAC: regular rhythm, normal S1 and S2, no murmurs, rubs, gallops. Radial and DP pulses 2+ bilaterally. RESPIRATORY:  Clear to auscultation without rales, wheezing or rhonchi  ABDOMEN: Soft, non-tender, non-distended MUSCULOSKELETAL:  No edema; No deformity  SKIN: Warm and dry NEUROLOGIC:  Alert and oriented x 3 PSYCHIATRIC:  Normal affect   ASSESSMENT:    1. Palpitations   2. Chest pain, unspecified type   3. Essential hypertension   4. Counseling on health promotion and disease prevention   5. Class 2 obesity due to excess calories without serious comorbidity with body mass index (BMI) of 38.0 to 38.9 in adult    PLAN:    1. Chest pain, palpitations:  -TSH, electrolytes normal  -normal cath without evidence of CAD in 2016  -chest pain is sharp, atypical. Has already had workup, low risk for this to be cardiac in nature  -regarding palpitations, was noted to have SVT during cath. While her sensations are overall uncommon, she may be having paroxysmal SVT without symptoms. Will order monitor to evaluate. Will also order echo to look for structural heart disease.  -if monitor, echo unrevealing, she will have had a complete evaluation of vasculature, structure, and rhythm from a cardiac perspective.  2. Primary prevention: -recommend heart healthy/Mediterranean diet, with whole grains, fruits, vegetable, fish, lean meats, nuts, and olive oil. Limit salt. -recommend moderate walking, 3-5 times/week for  30-50 minutes each session. Aim for at least 150 minutes.week. Goal should be pace of 3 miles/hours, or walking 1.5 miles in 30 minutes -recommend avoidance of tobacco products. Avoid excess alcohol. -Additional risk factor control:  -Diabetes: A1c is 5.3, does not carry diagnosis of diabetes  -Lipids: LDL  86, no CAD on cath, goal <100 given low risk below  -Hypertension: on amlodipine, lisinopril. Within range of goal  -Weight: class 2 obesity, BMI 38.8, counseled on lifestyle as above. -ASCVD risk score: The  10-year ASCVD risk score Mikey Bussing DC Jr., et al., 2013) is: 1.1%   Values used to calculate the score:     Age: 79 years     Sex: Female     Is Non-Hispanic African American: Yes     Diabetic: No     Tobacco smoker: No     Systolic Blood Pressure: 629 mmHg     Is BP treated: Yes     HDL Cholesterol: 85 mg/dL     Total Cholesterol: 185 mg/dL   Plan for follow up: 6 mos or sooner based on results of testing  Medication Adjustments/Labs and Tests Ordered: Current medicines are reviewed at length with the patient today.  Concerns regarding medicines are outlined above.  Orders Placed This Encounter  Procedures  . LONG TERM MONITOR (3-14 DAYS)  . EKG 12-Lead  . ECHOCARDIOGRAM COMPLETE   No orders of the defined types were placed in this encounter.   Patient Instructions  Medication Instructions:  Your physician recommends that you continue on your current medications as directed. Please refer to the Current Medication list given to you today.  If you need a refill on your cardiac medications before your next appointment, please call your pharmacy.   Lab work: None ordered If you have labs (blood work) drawn today and your tests are completely normal, you will receive your results only by: Marland Kitchen MyChart Message (if you have MyChart) OR . A paper copy in the mail If you have any lab test that is abnormal or we need to change your treatment, we will call you to review the  results.  Testing/Procedures: Your physician has requested that you have an echocardiogram. Echocardiography is a painless test that uses sound waves to create images of your heart. It provides your doctor with information about the size and shape of your heart and how well your heart's chambers and valves are working. This procedure takes approximately one hour. There are no restrictions for this procedure.  Your physician has recommended that you wear an zio-patch monitor. Zio-patch monitors are medical devices that record the heart's electrical activity. Doctors most often [place] these monitors to diagnose arrhythmias. Arrhythmias are problems with the speed or rhythm of the heartbeat. The monitor is a small, portable device. You can wear one while you do your normal daily activities. This is usually used to diagnose what is causing palpitations/syncope (passing out).      Follow-Up: At [facility], you and your health needs are our priority.  As part of our continuing mission to provide you with exceptional heart care, we have created designated Provider Care Teams.  These Care Teams include your primary Cardiologist (physician) and Advanced Practice Providers (APPs -  Physician Assistants and Nurse Practitioners) who all work together to provide you with the care you need, when you need it. You will need a follow up appointment in 6 months.   Please call our office 2 months in advance to schedule this appointment.  You may see Dr.Hosteen Kienast or one of the following Advanced Practice Providers on your designated Care Team:   Rosaria Ferries, PA-C . Jory Sims, DNP, ANP        Signed, Buford Dresser, MD PhD 09/26/2018 8:34 AM    [place]

## 2018-09-28 ENCOUNTER — Encounter: Payer: Self-pay | Admitting: Cardiology

## 2018-10-07 ENCOUNTER — Ambulatory Visit (HOSPITAL_COMMUNITY): Payer: BLUE CROSS/BLUE SHIELD | Attending: Cardiovascular Disease

## 2018-10-07 ENCOUNTER — Other Ambulatory Visit: Payer: Self-pay

## 2018-10-07 ENCOUNTER — Ambulatory Visit (INDEPENDENT_AMBULATORY_CARE_PROVIDER_SITE_OTHER): Payer: BLUE CROSS/BLUE SHIELD

## 2018-10-07 DIAGNOSIS — R079 Chest pain, unspecified: Secondary | ICD-10-CM | POA: Insufficient documentation

## 2018-10-07 DIAGNOSIS — R002 Palpitations: Secondary | ICD-10-CM | POA: Diagnosis not present

## 2018-10-09 ENCOUNTER — Ambulatory Visit: Payer: BLUE CROSS/BLUE SHIELD | Admitting: Family Medicine

## 2018-10-09 ENCOUNTER — Encounter: Payer: Self-pay | Admitting: Family Medicine

## 2018-10-09 ENCOUNTER — Other Ambulatory Visit (HOSPITAL_COMMUNITY)
Admission: RE | Admit: 2018-10-09 | Discharge: 2018-10-09 | Disposition: A | Discharge status: Home / Self Care | Payer: BLUE CROSS/BLUE SHIELD | Source: Ambulatory Visit | Attending: Family Medicine | Admitting: Family Medicine

## 2018-10-09 VITALS — BP 128/88 | HR 80 | Temp 97.6°F | Ht 63.0 in | Wt 219.8 lb

## 2018-10-09 DIAGNOSIS — L304 Erythema intertrigo: Secondary | ICD-10-CM | POA: Insufficient documentation

## 2018-10-09 DIAGNOSIS — E669 Obesity, unspecified: Secondary | ICD-10-CM

## 2018-10-09 DIAGNOSIS — M1990 Unspecified osteoarthritis, unspecified site: Secondary | ICD-10-CM | POA: Insufficient documentation

## 2018-10-09 DIAGNOSIS — I10 Essential (primary) hypertension: Secondary | ICD-10-CM | POA: Insufficient documentation

## 2018-10-09 DIAGNOSIS — Z124 Encounter for screening for malignant neoplasm of cervix: Secondary | ICD-10-CM

## 2018-10-09 DIAGNOSIS — Z7982 Long term (current) use of aspirin: Secondary | ICD-10-CM

## 2018-10-09 DIAGNOSIS — Z6838 Body mass index (BMI) 38.0-38.9, adult: Secondary | ICD-10-CM

## 2018-10-09 DIAGNOSIS — Z79899 Other long term (current) drug therapy: Secondary | ICD-10-CM | POA: Insufficient documentation

## 2018-10-09 DIAGNOSIS — B373 Candidiasis of vulva and vagina: Secondary | ICD-10-CM

## 2018-10-09 DIAGNOSIS — B3731 Acute candidiasis of vulva and vagina: Secondary | ICD-10-CM

## 2018-10-09 MED ORDER — FLUCONAZOLE 150 MG PO TABS: 150.0000 mg | ORAL_TABLET | Freq: Once | ORAL | 0 refills | Status: AC

## 2018-10-09 MED ORDER — CLOTRIMAZOLE 1 % EX CREA: 1.0000 "application " | TOPICAL_CREAM | Freq: Two times a day (BID) | CUTANEOUS | 0 refills | Status: DC

## 2018-10-09 MED FILL — FLUCONAZOLE 150 MG TABS: 150 | 3 days supply | Qty: 2 | Fill #0

## 2018-10-09 NOTE — Progress Notes (Signed)
Subjective:  Patient ID: Barbara Dunn, female    DOB: 06-02-1971  Age: 46 y.o. MRN: [id number]  CC: Gynecologic Exam   HPI Barbara Dunn  is a  47 year old female with a history of hypertension, migraine, obesity who presents today for a Pap smear. She informs me she has had a previous history of " freezing of her cervix" due to an abnormal Pap smear in the past. She also complains of a pruritic rash beneath both breasts which has been present for the last couple of weeks.  She has had similar rash a couple of months back and was prescribed nystatin powder which she never picked up as rash resolved spontaneously.  Past Medical History:  Diagnosis Date  . Anemia   . Arthritis   . Hypertension   . Migraines   . Obesity     Past Surgical History:  Procedure Laterality Date  . CARDIAC CATHETERIZATION N/A 08/02/2015   Procedure: Left Heart Cath and Coronary Angiography;  Surgeon: Troy Sine, MD;  Location: [facility];  Service: Cardiovascular;  Laterality: N/A;  . CERVIX LESION DESTRUCTION    . TUBAL LIGATION    . TUBAL LIGATION      No Known Allergies   Outpatient Medications Prior to Visit  Medication Sig Dispense Refill  . amLODipine (NORVASC) 5 MG tablet Take 1 tablet (5 mg total) by mouth daily. 30 tablet 6  . aspirin 81 MG tablet Take 81 mg by mouth daily.     Marland Kitchen BIOTIN PO Take 2 tablets by mouth daily.     . cetirizine (ZYRTEC) 10 MG tablet Take 10 mg by mouth daily.    . diphenhydrAMINE (BENADRYL) 25 mg capsule Take 25 mg by mouth every 6 (six) hours as needed for itching. Reported on 01/23/2016    . fluticasone (FLONASE) 50 MCG/ACT nasal spray PLACE 2 SPRAYS INTO BOTH NOSTRILS DAILY. 16 g 2  . lisinopril (PRINIVIL,ZESTRIL) 20 MG tablet Take 1 tablet (20 mg total) by mouth daily. 30 tablet 6  . meclizine (ANTIVERT) 25 MG tablet Take 1 tablet (25 mg total) by mouth 3 (three) times daily as needed for dizziness. 60 tablet 1  . MONO-LINYAH 0.25-35 MG-MCG tablet TAKE 1  TABLET BY MOUTH DAILY. 28 tablet 11  . Multiple Vitamins-Minerals (MULTIVITAMIN ADULT PO) Take 1 tablet by mouth daily.     Marland Kitchen nystatin (NYSTATIN) powder Apply topically 2 (two) times daily. 45 g 1  . VENTOLIN HFA 108 (90 Base) MCG/ACT inhaler INHALE 1 PUFF INTO THE LUNGS EVERY 6 HOURS AS NEEDED FOR WHEEZING OR SHORTNESS OF BREATH. 18 g 1  . chlorhexidine (PERIDEX) 0.12 % solution chlorhexidine gluconate 0.12 % mouthwash    . docusate sodium (COLACE) 100 MG capsule Take 100 mg by mouth 2 (two) times daily.    . SUMAtriptan (IMITREX) 50 MG tablet Take 2 tablets (100 mg total) by mouth once for 1 dose. May repeat in 2 hours if headache persists, max 200mg /day 10 tablet 2   No facility-administered medications prior to visit.     ROS Review of Systems  Constitutional: Negative for activity change, appetite change and fatigue.  HENT: Negative for congestion, sinus pressure and sore throat.   Eyes: Negative for visual disturbance.  Respiratory: Negative for cough, chest tightness, shortness of breath and wheezing.   Cardiovascular: Negative for chest pain and palpitations.  Gastrointestinal: Negative for abdominal distention, abdominal pain and constipation.  Endocrine: Negative for polydipsia.  Genitourinary: Negative for dysuria and frequency.  Musculoskeletal: Negative for arthralgias and back pain.  Skin: Negative for rash.  Neurological: Negative for tremors, light-headedness and numbness.  Hematological: Does not bruise/bleed easily.  Psychiatric/Behavioral: Negative for agitation and behavioral problems.    Objective:  BP 128/88   Pulse 80   Temp 97.6 F (36.4 C) (Oral)   Ht 5\' 3"  (1.6 m)   Wt 219 lb 12.8 oz (99.7 kg)   LMP 09/20/2018   SpO2 100%   BMI 38.94 kg/m   BP/Weight 10/09/2018 09/26/2018 78/06/3809  Systolic BP 175 102 585  Diastolic BP 88 72 86  Wt. (Lbs) 219.8 219 217.4  BMI 38.94 38.79 37.32      Physical Exam Constitutional:      Appearance: She is  well-developed.  Cardiovascular:     Rate and Rhythm: Normal rate.     Heart sounds: Normal heart sounds. No murmur.  Pulmonary:     Effort: Pulmonary effort is normal.     Breath sounds: Normal breath sounds. No wheezing or rales.  Chest:     Chest wall: No tenderness.  Abdominal:     General: Bowel sounds are normal. There is no distension.     Palpations: Abdomen is soft. There is no mass.     Tenderness: There is no abdominal tenderness.  Genitourinary:    Comments: Normal external genitalia, vagina with whitish, thick discharge. No cervical motion tenderness, normal adnexa Musculoskeletal: Normal range of motion.  Skin:    Comments: Intertriginous areas in bilateral inframammary region  Neurological:     Mental Status: She is alert and oriented to person, place, and time.      Assessment & Plan:   1. Screening for cervical cancer - Cytology - PAP()  2. Intertrigo - clotrimazole (LOTRIMIN) 1 % cream; Apply 1 application topically 2 (two) times daily.  Dispense: 30 g; Refill: 0  3. Vaginal candidiasis - fluconazole (DIFLUCAN) 150 MG tablet; Take 1 tablet (150 mg total) by mouth once for 1 dose. Repeat second dose in 72 hours  Dispense: 2 tablet; Refill: 0   Meds ordered this encounter  Medications  . fluconazole (DIFLUCAN) 150 MG tablet    Sig: Take 1 tablet (150 mg total) by mouth once for 1 dose. Repeat second dose in 72 hours    Dispense:  2 tablet    Refill:  0  . clotrimazole (LOTRIMIN) 1 % cream    Sig: Apply 1 application topically 2 (two) times daily.    Dispense:  30 g    Refill:  0    Follow-up: Return in about 3 months (around 01/08/2019) for Follow-up of chronic medical conditions.   Charlott Rakes MD

## 2018-10-09 NOTE — Progress Notes (Signed)
Patient has rash on chest.

## 2018-10-13 LAB — CYTOLOGY - PAP
Diagnosis: NEGATIVE
HPV (WINDOPATH): NOT DETECTED

## 2018-10-13 MED FILL — AMLODIPINE BESYLATE 5 MG TA: 5 | 30 days supply | Qty: 30 | Fill #0

## 2018-10-13 MED FILL — LISINOPRIL 20 MG TAB: 20 | 30 days supply | Qty: 30 | Fill #0

## 2018-10-17 MED FILL — MONO-LINYAH 28 TABLET: 0.25-35 | 28 days supply | Qty: 28 | Fill #9

## 2018-11-10 ENCOUNTER — Other Ambulatory Visit (HOSPITAL_COMMUNITY)
Admission: RE | Admit: 2018-11-10 | Discharge: 2018-11-10 | Disposition: A | Discharge status: Home / Self Care | Payer: BLUE CROSS/BLUE SHIELD | Source: Ambulatory Visit | Attending: Family Medicine | Admitting: Family Medicine

## 2018-11-10 ENCOUNTER — Ambulatory Visit (HOSPITAL_BASED_OUTPATIENT_CLINIC_OR_DEPARTMENT_OTHER): Payer: BLUE CROSS/BLUE SHIELD | Admitting: Family Medicine

## 2018-11-10 ENCOUNTER — Telehealth: Payer: Self-pay | Admitting: Family Medicine

## 2018-11-10 VITALS — BP 125/84 | HR 81 | Temp 98.8°F | Resp 18 | Ht 63.0 in | Wt 221.0 lb

## 2018-11-10 DIAGNOSIS — T192XXA Foreign body in vulva and vagina, initial encounter: Secondary | ICD-10-CM

## 2018-11-10 DIAGNOSIS — N898 Other specified noninflammatory disorders of vagina: Secondary | ICD-10-CM | POA: Diagnosis not present

## 2018-11-10 DIAGNOSIS — R1024 Suprapubic pain: Secondary | ICD-10-CM

## 2018-11-10 DIAGNOSIS — R102 Pelvic and perineal pain: Secondary | ICD-10-CM | POA: Diagnosis not present

## 2018-11-10 LAB — POCT URINALYSIS DIP (CLINITEK)
Blood, UA: NEGATIVE
Glucose, UA: NEGATIVE mg/dL
Leukocytes, UA: NEGATIVE
Nitrite, UA: NEGATIVE
POC PROTEIN,UA: 30 — AB
Spec Grav, UA: 1.02
Urobilinogen, UA: 2 U/dL — AB
pH, UA: 7

## 2018-11-10 MED ORDER — METRONIDAZOLE 0.75 % VA GEL: 1.0000 | Freq: Every day | VAGINAL | 0 refills | Status: DC

## 2018-11-10 MED FILL — metroNIDAZOLE 0.75 % GEL: 0.75 | 5 days supply | Qty: 70 | Fill #0

## 2018-11-10 NOTE — Progress Notes (Signed)
Subjective:    Patient ID: Barbara Dunn, female    DOB: [birth year], 48 y.o.   MRN: [id number]  HPI       48 yo female who is seen secondary to the complaint of some mild lower abdominal discomfort and patient thinks that a condom may be retained in her vaginal canal and she and her partner could not find the condom after sexual intercourse this Saturday night. Patient denies any dysuria and no urinary frequency.  Patient with some mild, lower abdominal discomfort which is dull to slightly crampy and is about a 2-3 on a 0-to-10 scale.  No fever or chills and no nausea.  Patient denies pelvic or vaginal pain.  Patient has noticed a slight increase in clear to white vaginal discharge.  Past Medical History:  Diagnosis Date  . Anemia   . Arthritis   . Hypertension   . Migraines   . Obesity    Past Surgical History:  Procedure Laterality Date  . CARDIAC CATHETERIZATION N/A 08/02/2015   Procedure: Left Heart Cath and Coronary Angiography;  Surgeon: Troy Sine, MD;  Location: [facility];  Service: Cardiovascular;  Laterality: N/A;  . CERVIX LESION DESTRUCTION    . TUBAL LIGATION    . TUBAL LIGATION     Social History   Tobacco Use  . Smoking status: Former Smoker    Last attempt to quit: 12/15/2003    Years since quitting: 14.9  . Smokeless tobacco: Never Used  . Tobacco comment: quit 10 years ago  Substance Use Topics  . Alcohol use: Yes    Alcohol/week: 2.0 standard drinks    Types: 1 Glasses of wine, 1 Shots of liquor per week    Comment: Once every 3 months   . Drug use: No  No Known Allergies    Review of Systems  Constitutional: Positive for fatigue. Negative for chills and fever.  HENT: Negative for mouth sores and trouble swallowing.   Respiratory: Negative for cough and shortness of breath.   Cardiovascular: Negative for chest pain, palpitations and leg swelling.  Gastrointestinal: Positive for abdominal pain (Mild lower abdominal discomfort which is not  constant). Negative for constipation, diarrhea and nausea.  Genitourinary: Positive for vaginal discharge. Negative for dysuria, flank pain, frequency, pelvic pain, urgency and vaginal pain.  Musculoskeletal: Negative for back pain and gait problem.  Neurological: Negative for dizziness and headaches.       Objective:   Physical Exam BP 125/84 (BP Location: Left Arm, Patient Position: Sitting, Cuff Size: Normal)   Pulse 81   Temp 98.8 F (37.1 C) (Oral)   Resp 18   Ht 5\' 3"  (1.6 m)   Wt 221 lb (100.2 kg)   LMP 10/10/2018   SpO2 100%   BMI 39.15 kg/m nurses notes and vital signs reviewed; CMA present for exam  General-well-nourished, well-developed overweight female in no acute distress but patient appears slightly anxious Lungs-clear to auscultation bilaterally Cardiovascular- regular rate and regular rhythm Back-no CVA tenderness Abdomen-soft, mild suprapubic discomfort on exam, no rebound or guarding GU- normal external genitalia, normal vaginal canal, patient does have a mild increase in clear to light white medium viscosity discharge in the posterior vaginal vault.  Patient with presence of retained condom which is partially tucked behind the left portion of the cervix.  Large cotton-tipped swab used to gently disengage the condom from entrapment behind the left edge of the cervix and condom was gently moved towards the opening of the vaginal  canal and was guided onto the speculum for removal.  After removal, bimanual examination was done and patient did not have any adnexal tenderness and no cervical motion tenderness        Assessment & Plan:  1. Suprapubic discomfort Patient with complaint of mild suprapubic discomfort which was also present on examination and urinalysis was done to look for possible urinary tract infection as well as urine cytology bacterial vaginitis, trichomonas, gonorrhea, chlamydia and candidal infection as patient with possible exposure to sexually  transmitted infections due to loss of the condom during sexual intercourse as well as retained foreign body in vaginal vault - POCT URINALYSIS DIP (CLINITEK) - Urine cytology ancillary only  2. Vaginal discharge Patient did have the presence of some vaginal discharge in the vaginal canal/posterior vaginal vault at the area of the cervix.  Patient is being placed on metronidazole gel per patient preference over oral medication while urine cytology is pending.  Patient likely with some vaginitis/irritation secondary to retained condom - Urine cytology ancillary only - metroNIDAZOLE (METROGEL VAGINAL) 0.75 % vaginal gel; Place 1 Applicatorful vaginally at bedtime. X 5 nights  Dispense: 70 g; Refill: 0  3. Retained vaginal foreign body, initial encounter Pelvic exam done at today's visit as patient reports that she and her partner could not find the condom after recent sexual intercourse over the weekend.  Retained condom was visualized in the vaginal vault and was removed without difficulty.  Patient had testing for STIs due to increased risk of transmission due to loss condom and patient was treated with MetroGel in case of vaginitis due to the presence of a retained condom.  Patient was asked to return to clinic if she continues to have suprapubic discomfort or increased vaginal discharge.  Patient may come to the clinic as well if she is having pelvic/vaginal discomfort but if this is severe, patient is encouraged to go to the emergency department for further evaluation and treatment  An After Visit Summary was printed and given to the patient.  Return if symptoms worsen or fail to improve.

## 2018-11-10 NOTE — Telephone Encounter (Signed)
Patient called to get info regarding an appointment to get a condom extraction, spoke with RMA was informed patient needs to go to ED   Spoke with patient, patient understood

## 2018-11-11 LAB — URINE CYTOLOGY ANCILLARY ONLY
Chlamydia: NEGATIVE
Neisseria Gonorrhea: NEGATIVE
Trichomonas: NEGATIVE

## 2018-11-11 MED FILL — FLUTICASONE PROP 50 MCG SPR: 50 | 30 days supply | Qty: 16 | Fill #2

## 2018-11-12 LAB — URINE CYTOLOGY ANCILLARY ONLY
Bacterial vaginitis: NEGATIVE
Candida vaginitis: NEGATIVE

## 2018-11-14 NOTE — Telephone Encounter (Signed)
-----   Message from Antony Blackbird, MD sent at 11/14/2018  2:55 AM EST ----- Please notify patient of negative urine cytology

## 2018-11-14 NOTE — Telephone Encounter (Signed)
Patient verified DOB Patient is aware of no infection being noted on cytology. Patient advised to drink plenty of water.

## 2018-11-17 MED FILL — AMLODIPINE BESYLATE 5 MG TA: 5 | 30 days supply | Qty: 30 | Fill #1

## 2018-11-17 MED FILL — LISINOPRIL 20 MG TAB: 20 | 30 days supply | Qty: 30 | Fill #1

## 2018-11-17 MED FILL — MONO-LINYAH 28 TABLET: 0.25-35 | 28 days supply | Qty: 28 | Fill #10

## 2018-11-24 ENCOUNTER — Encounter: Payer: Self-pay | Admitting: Family Medicine

## 2018-12-15 ENCOUNTER — Other Ambulatory Visit: Payer: Self-pay | Admitting: Family Medicine

## 2018-12-15 MED FILL — MONO-LINYAH 28 TABLET: 0.25-35 | 28 days supply | Qty: 28 | Fill #11

## 2018-12-15 MED FILL — FLUTICASONE PROP 50 MCG SPR: 50 | 30 days supply | Qty: 16 | Fill #0

## 2018-12-15 MED FILL — LISINOPRIL 20 MG TAB: 20 | 30 days supply | Qty: 30 | Fill #2

## 2018-12-15 MED FILL — AMLODIPINE BESYLATE 5 MG TA: 5 | 30 days supply | Qty: 30 | Fill #2

## 2018-12-15 MED FILL — SUMATRIPTAN SUCC 50 MG TAB: 50 | 30 days supply | Qty: 9 | Fill #2

## 2019-01-12 ENCOUNTER — Other Ambulatory Visit: Payer: Self-pay | Admitting: Family Medicine

## 2019-01-12 DIAGNOSIS — Z3041 Encounter for surveillance of contraceptive pills: Secondary | ICD-10-CM

## 2019-01-12 MED FILL — MONO-LINYAH 28 TABLET: 0.25-35 | 28 days supply | Qty: 28 | Fill #0

## 2019-01-13 ENCOUNTER — Ambulatory Visit: Payer: BLUE CROSS/BLUE SHIELD | Admitting: Family Medicine

## 2019-01-14 MED FILL — AMLODIPINE BESYLATE 5 MG TA: 5 | 30 days supply | Qty: 30 | Fill #3

## 2019-01-14 MED FILL — LISINOPRIL 20 MG TABLET: 20 | 30 days supply | Qty: 30 | Fill #3

## 2019-01-15 ENCOUNTER — Other Ambulatory Visit: Payer: Self-pay

## 2019-01-15 ENCOUNTER — Ambulatory Visit: Payer: BLUE CROSS/BLUE SHIELD | Attending: Family Medicine | Admitting: Family Medicine

## 2019-01-15 ENCOUNTER — Encounter: Payer: Self-pay | Admitting: Family Medicine

## 2019-01-15 DIAGNOSIS — G43009 Migraine without aura, not intractable, without status migrainosus: Secondary | ICD-10-CM

## 2019-01-15 DIAGNOSIS — B3731 Acute candidiasis of vulva and vagina: Secondary | ICD-10-CM

## 2019-01-15 DIAGNOSIS — I10 Essential (primary) hypertension: Secondary | ICD-10-CM

## 2019-01-15 DIAGNOSIS — B373 Candidiasis of vulva and vagina: Secondary | ICD-10-CM

## 2019-01-15 MED ORDER — LISINOPRIL 20 MG PO TABS: 20.0000 mg | ORAL_TABLET | Freq: Every day | ORAL | 6 refills | Status: DC

## 2019-01-15 MED ORDER — AMLODIPINE BESYLATE 5 MG PO TABS: 5.0000 mg | ORAL_TABLET | Freq: Every day | ORAL | 6 refills | Status: DC

## 2019-01-15 MED ORDER — SUMATRIPTAN SUCCINATE 50 MG PO TABS: 100.0000 mg | ORAL_TABLET | Freq: Once | ORAL | 2 refills | Status: DC

## 2019-01-15 MED ORDER — FLUCONAZOLE 150 MG PO TABS: 150.0000 mg | ORAL_TABLET | Freq: Once | ORAL | 1 refills | Status: AC

## 2019-01-15 MED ORDER — TOPIRAMATE 50 MG PO TABS: 50.0000 mg | ORAL_TABLET | Freq: Two times a day (BID) | ORAL | 3 refills | Status: DC

## 2019-01-15 MED FILL — FLUCONAZOLE 150 MG TABS: 150 | 1 days supply | Qty: 1 | Fill #0 | Status: TO

## 2019-01-15 NOTE — Progress Notes (Signed)
Patient was called and DOB has been verified. Patient pharmacy and medications has been verified and patient was transferred to PCP.

## 2019-01-15 NOTE — Progress Notes (Signed)
Virtual Visit via Telephone Note  I connected with Barbara Dunn on 01/15/19 at 10:50 AM EDT by telephone and verified that I am speaking with the correct person using two identifiers.   I discussed the limitations, risks, security and privacy concerns of performing an evaluation and management service by telephone and the availability of in person appointments. I also discussed with the patient that there may be a patient responsible charge related to this service. The patient expressed understanding and agreed to proceed.   History of Present Illness: She is a  48 year old female with a history of hypertension, migraine, obesity seen today with complaints of vaginal itching and discharge which is typical of her yeast infection.  Denies urinary symptoms. Also has migraines for which she is on Imitrex and states lately she has had an increase in frequency due to being stressed.  Denies presence of a migraine at this time. Noticed 2 episodes of irregular heartbeats this month.  Seen by cardiology in 09/2018 for same and cardiac monitor in 10/2018 revealed presence of 1 SVT which cardiology recommended to monitor.  She has an upcoming appointment with cardiology. Her blood pressures have been controlled with a blood pressure of 129/91 today.   Observations/Objective: Awake, alert, oriented x3  Assessment and Plan: 1. Essential hypertension Controlled from home blood pressure log Counseled on blood pressure goal of less than 130/80, low-sodium, DASH diet, medication compliance, 150 minutes of moderate intensity exercise per week. Discussed medication compliance, adverse effects. - amLODipine (NORVASC) 5 MG tablet; Take 1 tablet (5 mg total) by mouth daily.  Dispense: 30 tablet; Refill: 6 - lisinopril (PRINIVIL,ZESTRIL) 20 MG tablet; Take 1 tablet (20 mg total) by mouth daily.  Dispense: 30 tablet; Refill: 6  2. Migraine without aura and without status migrainosus, not  intractable Uncontrolled Topamax added to regimen - SUMAtriptan (IMITREX) 50 MG tablet; Take 2 tablets (100 mg total) by mouth once for 1 dose. May repeat in 2 hours if headache persists, max 200mg /day  Dispense: 10 tablet; Refill: 2  3.  Vaginal candidiasis Placed on Diflucan  Follow Up Instructions: Call the clinic in 3 months for follow-up visit   I discussed the assessment and treatment plan with the patient. The patient was provided an opportunity to ask questions and all were answered. The patient agreed with the plan and demonstrated an understanding of the instructions.   The patient was advised to call back or seek an in-person evaluation if the symptoms worsen or if the condition fails to improve as anticipated.  I provided 16 minutes of non-face-to-face time during this encounter.   Charlott Rakes, MD

## 2019-01-20 MED FILL — TOPIRAMATE 50 MG TABLET: 50 | 30 days supply | Qty: 60 | Fill #0 | Status: TO

## 2019-01-20 MED FILL — SUMATRIPTAN SUCC 50 MG TAB: 50 | 30 days supply | Qty: 10 | Fill #0

## 2019-01-20 MED FILL — FLUCONAZOLE 150 MG TABS: 150 | 1 days supply | Qty: 1 | Fill #0

## 2019-02-14 MED FILL — MONO-LINYAH 28 TABLET: 0.25-35 | 28 days supply | Qty: 28 | Fill #1

## 2019-02-14 MED FILL — LISINOPRIL 20 MG TAB: 20 | 30 days supply | Qty: 30 | Fill #4

## 2019-02-14 MED FILL — AMLODIPINE BESYLATE 5 MG TA: 5 | 30 days supply | Qty: 30 | Fill #4

## 2019-02-16 MED FILL — FLUTICASONE PROP 50 MCG SPR: 50 | 30 days supply | Qty: 16 | Fill #1

## 2019-02-19 ENCOUNTER — Telehealth: Payer: Self-pay

## 2019-02-19 NOTE — Telephone Encounter (Signed)
Message received from Triage. I reviewed patient chart. I returned patient call.   She states she has had "irregular heart beat" read on the BP machine when she takes her pressure at work. I advised her to keep her VIDEO appt with Dr. Harrell Gave next week. We also reviewed when she would need to report the the ER.  Message routed back to Triage - pt is amenable to VIDEO visit next week.

## 2019-02-19 NOTE — Telephone Encounter (Signed)
Contacted pt to change appointment to virtual visit. Pt state she was hoping to keep scheduled appointment as she has been experiencing on and off chest discomfort and irregular heart rhythm. She report, she does have some dizziness but has vertigo and usually associate symptoms with that.Pt state yesterday BP was 99/78 and today 102/76. Pt does report some chest discomfort while on the phone but state she doesn't feel like she is having a heart attack. Pt advised to report to ED for further evaluation but refused and voiced she will only go if symptoms worsen. Advised pt a message will be route to triage PA for recommendations.

## 2019-02-19 NOTE — Telephone Encounter (Signed)
Follow up  ° ° °Patient is returning your call. °

## 2019-02-19 NOTE — Telephone Encounter (Signed)
Attempted to contact pt to change appointment to virtual visit. Left message to call back. 

## 2019-02-19 NOTE — Telephone Encounter (Signed)
Thanks very much. I will plan to see her at her video visit next week.

## 2019-02-20 ENCOUNTER — Telehealth: Payer: Self-pay | Admitting: Cardiology

## 2019-02-20 NOTE — Telephone Encounter (Signed)
Smartphone/consent/ my chart/ pre reg completed °

## 2019-02-20 NOTE — Telephone Encounter (Signed)

## 2019-02-20 NOTE — Telephone Encounter (Signed)
Left message to call back  

## 2019-02-23 ENCOUNTER — Telehealth (INDEPENDENT_AMBULATORY_CARE_PROVIDER_SITE_OTHER): Payer: BLUE CROSS/BLUE SHIELD | Admitting: Cardiology

## 2019-02-23 ENCOUNTER — Encounter: Payer: Self-pay | Admitting: Cardiology

## 2019-02-23 VITALS — BP 117/87 | HR 69 | Ht 63.0 in | Wt 213.0 lb

## 2019-02-23 DIAGNOSIS — I10 Essential (primary) hypertension: Secondary | ICD-10-CM | POA: Diagnosis not present

## 2019-02-23 DIAGNOSIS — R0789 Other chest pain: Secondary | ICD-10-CM

## 2019-02-23 DIAGNOSIS — R002 Palpitations: Secondary | ICD-10-CM

## 2019-02-23 DIAGNOSIS — Z7189 Other specified counseling: Secondary | ICD-10-CM

## 2019-02-23 DIAGNOSIS — R0602 Shortness of breath: Secondary | ICD-10-CM

## 2019-02-23 NOTE — Patient Instructions (Signed)

## 2019-02-23 NOTE — Progress Notes (Signed)
Virtual Visit via Video Note   This visit type was conducted due to national recommendations for restrictions regarding the COVID-19 Pandemic (e.g. social distancing) in an effort to limit this patient's exposure and mitigate transmission in our community.  Due to her co-morbid illnesses, this patient is at least at moderate risk for complications without adequate follow up.  This format is felt to be most appropriate for this patient at this time.  All issues noted in this document were discussed and addressed.  A limited physical exam was performed with this format.  Please refer to the patient's chart for her consent to telehealth for [facility].   Date:  02/23/2019   ID:  Barbara Dunn, DOB 04-07-1971, MRN [id number]  Patient Location: Home Provider Location: Home  PCP:  Charlott Rakes, MD  Cardiologist:  Buford Dresser, MD  Electrophysiologist:  None   Evaluation Performed:  Follow-Up Visit  Chief Complaint:  palpitations  History of Present Illness:    Barbara Dunn is a 48 y.o. female with hx of hypertension, migraine, SVT, noncardiac chest pain.   The patient does not have symptoms concerning for COVID-19 infection (fever, chills, cough, or new shortness of breath).   BP have been running low for her, lowest upper 90s/low 100s, a few times last week. Highest numbers have been mid 130s/mid-90s. No syncope. Sometimes get woozy but also has a history of vertigo.  Has felt more palpitations, sometimes with mild chest discomfort over the last two weeks, intermittent, not related to palpitations. Has been having more shortness of breath, chest tightness, brief (<30 mins). 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 Has had bronchitis the last few weeks, thinks its worse with pollen. Hasn't noticed palpitations, but sees irregular heart beat on her monitor, occurs about 3-4 times/month. Having a lot of stress, has to go to work every day, works out in a wooded area and  has notice with pollen/warming weather that her symptoms are worse. 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   Past Medical History:  Diagnosis Date  . Anemia   . Arthritis   . Hypertension   . Migraines   . Obesity    Past Surgical History:  Procedure Laterality Date  . CARDIAC CATHETERIZATION N/A 08/02/2015   Procedure: Left Heart Cath and Coronary Angiography;  Surgeon: Troy Sine, MD;  Location: [facility];  Service: Cardiovascular;  Laterality: N/A;  . CERVIX LESION DESTRUCTION    . TUBAL LIGATION    . TUBAL LIGATION       Current Meds  Medication Sig  . amLODipine (NORVASC) 5 MG tablet Take 1 tablet (5 mg total) by mouth daily.  Marland Kitchen aspirin 81 MG tablet Take 81 mg by mouth daily.   Marland Kitchen BIOTIN PO Take 2 tablets by mouth daily.   . cetirizine (ZYRTEC) 10 MG tablet Take 10 mg by mouth daily.  . clotrimazole (LOTRIMIN) 1 % cream Apply 1 application topically 2 (two) times daily.  . diphenhydrAMINE (BENADRYL) 25 mg capsule Take 25 mg by mouth every 6 (six) hours as needed for itching. Reported on 01/23/2016  . fluticasone (FLONASE) 50 MCG/ACT nasal spray PLACE 2 SPRAYS INTO BOTH NOSTRILS DAILY.  Marland Kitchen lisinopril (PRINIVIL,ZESTRIL) 20 MG tablet Take 1 tablet (20 mg total) by mouth daily.  . meclizine (ANTIVERT) 25 MG tablet Take 1 tablet (25 mg total) by mouth 3 (three) times daily as needed for dizziness.  Marland Kitchen MONO-LINYAH 0.25-35 MG-MCG tablet  TAKE 1 TABLET BY MOUTH DAILY.  . Multiple Vitamins-Minerals (MULTIVITAMIN ADULT PO) Take 1 tablet by mouth daily.   . SUMAtriptan (IMITREX) 50 MG tablet Take 2 tablets (100 mg total) by mouth once for 1 dose. May repeat in 2 hours if headache persists, max 200mg /day  . topiramate (TOPAMAX) 50 MG tablet Take 1 tablet (50 mg total) by mouth 2 (two) times daily.  . VENTOLIN HFA 108 (90 Base) MCG/ACT inhaler INHALE 1 PUFF INTO THE LUNGS EVERY 6 HOURS AS NEEDED FOR WHEEZING OR SHORTNESS OF BREATH.     Allergies:   Patient has no  known allergies.   Social History   Tobacco Use  . Smoking status: Former Smoker    Last attempt to quit: 12/15/2003    Years since quitting: 15.2  . Smokeless tobacco: Never Used  . Tobacco comment: quit 10 years ago  Substance Use Topics  . Alcohol use: Yes    Alcohol/week: 2.0 standard drinks    Types: 1 Glasses of wine, 1 Shots of liquor per week    Comment: Once every 3 months   . Drug use: No     Family Hx: The patient's family history includes Hypertension in her mother; Schizophrenia in her brother, brother, mother, and sister.  ROS:   Please see the history of present illness.    Constitutional: Negative for chills, fever, night sweats, unintentional weight loss  HENT: Negative for ear pain and hearing loss.   Eyes: Negative for loss of vision and eye pain.  Respiratory: Negative for cough, sputum, wheezing.   Cardiovascular: See HPI. Gastrointestinal: Negative for abdominal pain, melena, and hematochezia.  Genitourinary: Negative for dysuria and hematuria.  Musculoskeletal: Negative for falls and myalgias.  Skin: Negative for itching and rash.  Neurological: Negative for focal weakness, focal sensory changes and loss of consciousness.  Endo/Heme/Allergies: Does not bruise/bleed easily.  All other systems reviewed and are negative.   Prior CV studies:   The following studies were reviewed today: Lexiscan, heart cath  Labs/Other Tests and Data Reviewed:    EKG:  An ECG dated 09/26/18 was personally reviewed today and demonstrated:  NSR  Recent Labs: 07/10/2018: ALT 13; BUN 8; Creatinine, Ser 0.79; Potassium 4.0; Sodium 142; TSH 0.919   Recent Lipid Panel Lab Results  Component Value Date/Time   CHOL 185 01/01/2018 09:48 AM   TRIG 69 01/01/2018 09:48 AM   HDL 85 01/01/2018 09:48 AM   CHOLHDL 2.2 01/01/2018 09:48 AM   CHOLHDL 1.8 07/27/2016 09:17 AM   LDLCALC 86 01/01/2018 09:48 AM    Wt Readings from Last 3 Encounters:  02/23/19 213 lb (96.6 kg)   11/10/18 221 lb (100.2 kg)  10/09/18 219 lb 12.8 oz (99.7 kg)     Objective:    Vital Signs:  BP 117/87   Pulse 69   Ht 5\' 3"  (1.6 m)   Wt 213 lb (96.6 kg)   BMI 37.73 kg/m    VITAL SIGNS:  reviewed GEN:  no acute distress EYES:  sclerae anicteric, EOMI - Extraocular Movements Intact RESPIRATORY:  normal respiratory effort, symmetric expansion CARDIOVASCULAR:  no JVD SKIN:  no rash, lesions or ulcers. MUSCULOSKELETAL:  no obvious deformities. NEURO:  alert and oriented x 3, no obvious focal deficit PSYCH:  normal affect  ASSESSMENT & PLAN:    Chest pain, palpitations:             -Workup, including labs, monitor, cath, echo have not shown serious cardiac abnormalities. Cath was without  CAD in 2016. She did have brief paroxysmal SVT, seen during cath and one episode on monitor, but these are very brief, rare, and self limited.             -she has had a complete evaluation of vasculature, structure, and rhythm from a cardiac perspective.  -her palpitations are not linked to abnormalities on her monitor (her SVT was asymptomatic), so this supports that her symptoms are not cardiac in origin.  Shortness of breath: she relates to allergies/pollen/working out in the woods. Normal echo. Not consistent with cardiac etiology. If it persists, recommend speaking with her PCP to see if she needs either an allergy evaluation or PFTs.  Hypertension: well controlled today. Continue amlodipine and lisinopril  Primary prevention: -recommend heart healthy/Mediterranean diet, with whole grains, fruits, vegetable, fish, lean meats, nuts, and olive oil. Limit salt. -recommend moderate walking, 3-5 times/week for 30-50 minutes each session. Aim for at least 150 minutes.week. Goal should be pace of 3 miles/hours, or walking 1.5 miles in 30 minutes -recommend avoidance of tobacco products. Avoid excess alcohol. -Additional risk factor control:             -Diabetes: A1c is 5.3, does not carry  diagnosis of diabetes             -Lipids: LDL  86, no CAD on cath             -Weight: class 2 obesity, BMI 37, counseled on lifestyle as above. -given age, low risk, does not need to be on aspirin from a cardiac standpoint. -ASCVD risk score: The 10-year ASCVD risk score Mikey Bussing DC Brooke Bonito., et al., 2013) is: 0.7%   Values used to calculate the score:     Age: 89 years     Sex: Female     Is Non-Hispanic African American: Yes     Diabetic: No     Tobacco smoker: No     Systolic Blood Pressure: 671 mmHg     Is BP treated: Yes     HDL Cholesterol: 85 mg/dL     Total Cholesterol: 185 mg/dL   COVID-19 Education: The signs and symptoms of COVID-19 were discussed with the patient and how to seek care for testing (follow up with PCP or arrange E-visit).  The importance of social distancing was discussed today.  Time:   Today, I have spent 27 minutes with the patient with telehealth technology discussing the above problems.    Patient Instructions  Medication Instructions:  Your Physician recommend you continue on your current medication as directed.    If you need a refill on your cardiac medications before your next appointment, please call your pharmacy.   Lab work: None  Testing/Procedures: None  Follow-Up: At [facility], you and your health needs are our priority.  As part of our continuing mission to provide you with exceptional heart care, we have created designated Provider Care Teams.  These Care Teams include your primary Cardiologist (physician) and Advanced Practice Providers (APPs -  Physician Assistants and Nurse Practitioners) who all work together to provide you with the care you need, when you need it. You will need a follow up appointment in 1 years.  Please call our office 2 months in advance to schedule this appointment.  You may see Buford Dresser, MD or one of the following Advanced Practice Providers on your designated Care Team:   Rosaria Ferries, PA-C .  Jory Sims, DNP, ANP      Medication Adjustments/Labs  and Tests Ordered: Current medicines are reviewed at length with the patient today.  Concerns regarding medicines are outlined above.   Tests Ordered: No orders of the defined types were placed in this encounter.   Medication Changes: No orders of the defined types were placed in this encounter.   Disposition:  Follow up 1 year or sooner  Signed, Buford Dresser, MD  02/23/2019 12:22 PM    [facility]

## 2019-02-27 ENCOUNTER — Encounter: Payer: Self-pay | Admitting: Cardiology

## 2019-03-07 ENCOUNTER — Other Ambulatory Visit: Payer: Self-pay | Admitting: Family Medicine

## 2019-03-07 DIAGNOSIS — J302 Other seasonal allergic rhinitis: Secondary | ICD-10-CM

## 2019-03-07 MED FILL — TOPIRAMATE 50 MG TABLET: 50 | 30 days supply | Qty: 60 | Fill #0

## 2019-03-07 MED FILL — MONO-LINYAH 28 TABLET: 0.25-35 | 28 days supply | Qty: 28 | Fill #2

## 2019-03-09 MED FILL — VENTOLIN HFA 90 MCG INHALER: 108 (90 BAS | 25 days supply | Qty: 18 | Fill #0

## 2019-03-17 DIAGNOSIS — Z20828 Contact with and (suspected) exposure to other viral communicable diseases: Secondary | ICD-10-CM | POA: Diagnosis not present

## 2019-03-19 MED FILL — LISINOPRIL 20 MG TAB: 20 | 30 days supply | Qty: 30 | Fill #5

## 2019-03-19 MED FILL — AMLODIPINE BESYLATE 5 MG TA: 5 | 30 days supply | Qty: 30 | Fill #5

## 2019-03-30 ENCOUNTER — Telehealth: Payer: Self-pay | Admitting: Family Medicine

## 2019-03-30 NOTE — Telephone Encounter (Signed)
Patient called wanting to be seen in person and wanted to speak with someone in regards to it. Please follow up.

## 2019-03-30 NOTE — Telephone Encounter (Signed)
Patient was called and informed that she will have a in person visit

## 2019-03-31 ENCOUNTER — Ambulatory Visit: Payer: BC Managed Care – PPO | Attending: Family Medicine | Admitting: Family Medicine

## 2019-03-31 ENCOUNTER — Encounter: Payer: Self-pay | Admitting: Family Medicine

## 2019-03-31 ENCOUNTER — Other Ambulatory Visit: Payer: Self-pay

## 2019-03-31 VITALS — BP 131/90 | HR 83 | Temp 98.2°F | Ht 63.0 in | Wt 214.0 lb

## 2019-03-31 DIAGNOSIS — N3000 Acute cystitis without hematuria: Secondary | ICD-10-CM

## 2019-03-31 DIAGNOSIS — J302 Other seasonal allergic rhinitis: Secondary | ICD-10-CM | POA: Diagnosis not present

## 2019-03-31 DIAGNOSIS — I10 Essential (primary) hypertension: Secondary | ICD-10-CM

## 2019-03-31 LAB — POCT URINALYSIS DIP (CLINITEK)
Bilirubin, UA: NEGATIVE
Glucose, UA: NEGATIVE mg/dL
Ketones, POC UA: NEGATIVE mg/dL
Leukocytes, UA: NEGATIVE
Nitrite, UA: POSITIVE — AB
POC PROTEIN,UA: NEGATIVE
Spec Grav, UA: 1.015 (ref 1.010–1.025)
Urobilinogen, UA: 1 E.U./dL
pH, UA: 7.5 (ref 5.0–8.0)

## 2019-03-31 MED ORDER — NITROFURANTOIN MONOHYD MACRO 100 MG PO CAPS: 100.0000 mg | ORAL_CAPSULE | Freq: Two times a day (BID) | ORAL | 0 refills | Status: DC

## 2019-03-31 MED ORDER — FLUTICASONE PROPIONATE HFA 110 MCG/ACT IN AERO: 1.0000 | INHALATION_SPRAY | Freq: Two times a day (BID) | RESPIRATORY_TRACT | 2 refills | Status: DC

## 2019-03-31 MED ORDER — BENZONATATE 100 MG PO CAPS: 100.0000 mg | ORAL_CAPSULE | Freq: Three times a day (TID) | ORAL | 0 refills | Status: DC

## 2019-03-31 MED FILL — FLOVENT HFA 110 MCG INHALER: 110 | 30 days supply | Qty: 12 | Fill #0

## 2019-03-31 MED FILL — NITROFURANTOIN MONO-MCR 100: 100 | 7 days supply | Qty: 14 | Fill #0

## 2019-03-31 MED FILL — BENZONATATE 100 MG CAPS: 100 | 10 days supply | Qty: 30 | Fill #0

## 2019-03-31 NOTE — Progress Notes (Signed)
Subjective:  Patient ID: Barbara Dunn, female    DOB: [birth year]  Age: 48 y.o. MRN: [id number]  CC: Bronchitis and Flank Pain   HPI Barbara Dunn is a 48 year-old female with a history of hypertension, migraine, obesity seen for an acute visit today. She informs me she has bronchitis as she has been coughing a lot and cough has been mostly dry and is worse at night.  She denies postnasal drip, sinus congestion, rhinorrhea, dyspnea, wheezing but she endorses having to use her Proventil more than usual.  She informs me she was diagnosed with asthma by an Allergist in the past but has just used a Proventil inhaler as needed but is not having to use it daily. She uses OTC loratadine.  She complains of right flank pain which occurred yesterday while she was driving, was severe seeing her to cry and caused her to pull over.  Pain is absent at this time and she denies dysuria, urinary frequency, abdominal pain, fever.  Past Medical History:  Diagnosis Date  . Anemia   . Arthritis   . Hypertension   . Migraines   . Obesity     Past Surgical History:  Procedure Laterality Date  . CARDIAC CATHETERIZATION N/A 08/02/2015   Procedure: Left Heart Cath and Coronary Angiography;  Surgeon: Troy Sine, MD;  Location: [facility];  Service: Cardiovascular;  Laterality: N/A;  . CERVIX LESION DESTRUCTION    . TUBAL LIGATION    . TUBAL LIGATION      Family History  Problem Relation Age of Onset  . Hypertension Mother   . Schizophrenia Mother   . Schizophrenia Sister   . Schizophrenia Brother   . Schizophrenia Brother     No Known Allergies  Outpatient Medications Prior to Visit  Medication Sig Dispense Refill  . amLODipine (NORVASC) 5 MG tablet Take 1 tablet (5 mg total) by mouth daily. 30 tablet 6  . aspirin 81 MG tablet Take 81 mg by mouth daily.     Marland Kitchen BIOTIN PO Take 2 tablets by mouth daily.     . cetirizine (ZYRTEC) 10 MG tablet Take 10 mg by mouth daily.    . chlorhexidine  (PERIDEX) 0.12 % solution Use as directed 5 mLs in the mouth or throat as needed.     . clotrimazole (LOTRIMIN) 1 % cream Apply 1 application topically 2 (two) times daily. 30 g 0  . diphenhydrAMINE (BENADRYL) 25 mg capsule Take 25 mg by mouth every 6 (six) hours as needed for itching. Reported on 01/23/2016    . docusate sodium (COLACE) 100 MG capsule Take 100 mg by mouth as needed.     . fluticasone (FLONASE) 50 MCG/ACT nasal spray PLACE 2 SPRAYS INTO BOTH NOSTRILS DAILY. 16 g 2  . lisinopril (PRINIVIL,ZESTRIL) 20 MG tablet Take 1 tablet (20 mg total) by mouth daily. 30 tablet 6  . meclizine (ANTIVERT) 25 MG tablet Take 1 tablet (25 mg total) by mouth 3 (three) times daily as needed for dizziness. 60 tablet 1  . MONO-LINYAH 0.25-35 MG-MCG tablet TAKE 1 TABLET BY MOUTH DAILY. 28 tablet 2  . Multiple Vitamins-Minerals (MULTIVITAMIN ADULT PO) Take 1 tablet by mouth daily.     Marland Kitchen topiramate (TOPAMAX) 50 MG tablet Take 1 tablet (50 mg total) by mouth 2 (two) times daily. 60 tablet 3  . VENTOLIN HFA 108 (90 Base) MCG/ACT inhaler INHALE 1 PUFF INTO THE LUNGS EVERY 6 HOURS AS NEEDED FOR WHEEZING OR SHORTNESS OF  BREATH. 18 g 1  . metroNIDAZOLE (METROGEL VAGINAL) 0.75 % vaginal gel Place 1 Applicatorful vaginally at bedtime. X 5 nights (Patient not taking: Reported on 01/15/2019) 70 g 0  . nystatin (NYSTATIN) powder Apply topically 2 (two) times daily. (Patient not taking: Reported on 01/15/2019) 45 g 1  . SUMAtriptan (IMITREX) 50 MG tablet Take 2 tablets (100 mg total) by mouth once for 1 dose. May repeat in 2 hours if headache persists, max 236m/day 10 tablet 2   No facility-administered medications prior to visit.      ROS Review of Systems  Constitutional: Negative for activity change, appetite change and fatigue.  HENT: Negative for congestion, sinus pressure and sore throat.   Eyes: Negative for visual disturbance.  Respiratory: Positive for cough. Negative for chest tightness, shortness of breath  and wheezing.   Cardiovascular: Negative for chest pain and palpitations.  Gastrointestinal: Negative for abdominal distention, abdominal pain and constipation.  Endocrine: Negative for polydipsia.  Genitourinary: Positive for flank pain. Negative for dysuria and frequency.  Musculoskeletal: Negative for arthralgias and back pain.  Skin: Negative for rash.  Neurological: Negative for tremors, light-headedness and numbness.  Hematological: Does not bruise/bleed easily.  Psychiatric/Behavioral: Negative for agitation and behavioral problems.    Objective:  BP 131/90   Pulse 83   Temp 98.2 F (36.8 C) (Oral)   Ht 5' 3"  (1.6 m)   Wt 214 lb (97.1 kg)   SpO2 99%   BMI 37.91 kg/m   BP/Weight 03/31/2019 02/23/2019 12/39/5320 Systolic BP [id number] Diastolic BP 90 87 84  Wt. (Lbs) 214 213 221  BMI 37.91 37.73 39.15      Physical Exam Constitutional:      Appearance: She is well-developed.  Cardiovascular:     Rate and Rhythm: Normal rate.     Heart sounds: Normal heart sounds. No murmur.  Pulmonary:     Effort: Pulmonary effort is normal.     Breath sounds: Normal breath sounds. No wheezing or rales.  Chest:     Chest wall: No tenderness.  Abdominal:     General: Bowel sounds are normal. There is no distension.     Palpations: Abdomen is soft. There is no mass.     Tenderness: There is no abdominal tenderness.  Musculoskeletal: Normal range of motion.     Comments: No CVA tenderness b/l  Neurological:     Mental Status: She is alert and oriented to person, place, and time.     CMP Latest Ref Rng & Units 07/10/2018 01/01/2018 12/26/2017  Glucose 65 - 99 mg/dL 77 78 101(H)  BUN 6 - 24 mg/dL 8 11 7   Creatinine 0.57 - 1.00 mg/dL 0.79 0.79 0.73  Sodium 134 - 144 mmol/L 142 139 138  Potassium 3.5 - 5.2 mmol/L 4.0 3.9 3.3(L)  Chloride 96 - 106 mmol/L 102 103 104  CO2 20 - 29 mmol/L 24 21 25   Calcium 8.7 - 10.2 mg/dL 8.8 8.9 8.8(L)  Total Protein 6.0 - 8.5 g/dL 6.7 7.0 -   Total Bilirubin 0.0 - 1.2 mg/dL 0.4 0.3 -  Alkaline Phos 39 - 117 IU/L 51 45 -  AST 0 - 40 IU/L 18 21 -  ALT 0 - 32 IU/L 13 15 -    Lipid Panel     Component Value Date/Time   CHOL 185 01/01/2018 0948   TRIG 69 01/01/2018 0948   HDL 85 01/01/2018 0948   CHOLHDL 2.2 01/01/2018 0948   CHOLHDL 1.8  07/27/2016 0917   VLDL 14 07/27/2016 0917   LDLCALC 86 01/01/2018 0948    CBC    Component Value Date/Time   WBC 7.6 09/03/2017 1501   WBC 5.7 01/10/2016 0833   RBC 3.96 09/03/2017 1501   RBC 4.15 01/10/2016 0833   HGB 11.5 09/03/2017 1501   HCT 34.9 09/03/2017 1501   PLT 302 09/03/2017 1501   MCV 88 09/03/2017 1501   MCH 29.0 09/03/2017 1501   MCH 28.9 01/10/2016 0833   MCHC 33.0 09/03/2017 1501   MCHC 32.0 01/10/2016 0833   RDW 12.6 09/03/2017 1501   LYMPHSABS 3.1 09/03/2017 1501   MONOABS 0.7 10/19/2015 1008   EOSABS 0.3 09/03/2017 1501   BASOSABS 0.0 09/03/2017 1501    Lab Results  Component Value Date   HGBA1C 5.3 01/01/2018    Assessment & Plan:   1. Acute cystitis without hematuria Increase fluid intake - POCT URINALYSIS DIP (CLINITEK) - nitrofurantoin, macrocrystal-monohydrate, (MACROBID) 100 MG capsule; Take 1 capsule (100 mg total) by mouth 2 (two) times daily.  Dispense: 14 capsule; Refill: 0  2. Seasonal allergies She is using Proventil more than usual and provides a remote history of asthma We will add on Flonase to her regimen Continue antihistamine - fluticasone (FLOVENT HFA) 110 MCG/ACT inhaler; Inhale 1 puff into the lungs 2 (two) times daily.  Dispense: 1 Inhaler; Refill: 2 - benzonatate (TESSALON) 100 MG capsule; Take 1 capsule (100 mg total) by mouth 3 (three) times daily.  Dispense: 30 capsule; Refill: 0  3. Essential hypertension Controlled Continue antihypertensive Counseled on blood pressure goal of less than 130/80, low-sodium, DASH diet, medication compliance, 150 minutes of moderate intensity exercise per week. Discussed medication  compliance, adverse effects. - CMP14+EGFR   Meds ordered this encounter  Medications  . nitrofurantoin, macrocrystal-monohydrate, (MACROBID) 100 MG capsule    Sig: Take 1 capsule (100 mg total) by mouth 2 (two) times daily.    Dispense:  14 capsule    Refill:  0  . fluticasone (FLOVENT HFA) 110 MCG/ACT inhaler    Sig: Inhale 1 puff into the lungs 2 (two) times daily.    Dispense:  1 Inhaler    Refill:  2  . benzonatate (TESSALON) 100 MG capsule    Sig: Take 1 capsule (100 mg total) by mouth 3 (three) times daily.    Dispense:  30 capsule    Refill:  0    Follow-up: Return for ollow up of chronic medical conditions, keep previously scheduled appointment.       Charlott Rakes, MD, FAAFP. [facility], [place]   03/31/2019, 4:17 PM

## 2019-04-01 LAB — CMP14+EGFR
ALT: 13 IU/L (ref 0–32)
AST: 18 IU/L (ref 0–40)
Albumin/Globulin Ratio: 1.4 (ref 1.2–2.2)
Albumin: 3.9 g/dL (ref 3.8–4.8)
Alkaline Phosphatase: 50 IU/L (ref 39–117)
BUN/Creatinine Ratio: 10 (ref 9–23)
BUN: 12 mg/dL (ref 6–24)
Bilirubin Total: 0.3 mg/dL (ref 0.0–1.2)
CO2: 20 mmol/L (ref 20–29)
Calcium: 8.8 mg/dL (ref 8.7–10.2)
Chloride: 108 mmol/L — ABNORMAL HIGH (ref 96–106)
Creatinine, Ser: 1.19 mg/dL — ABNORMAL HIGH (ref 0.57–1.00)
GFR calc Af Amer: 63 mL/min/{1.73_m2} (ref >59)
GFR calc non Af Amer: 55 mL/min/{1.73_m2} — ABNORMAL LOW (ref >59)
Globulin, Total: 2.8 g/dL (ref 1.5–4.5)
Glucose: 73 mg/dL (ref 65–99)
Potassium: 4 mmol/L (ref 3.5–5.2)
Sodium: 141 mmol/L (ref 134–144)
Total Protein: 6.7 g/dL (ref 6.0–8.5)

## 2019-04-06 ENCOUNTER — Other Ambulatory Visit: Payer: Self-pay | Admitting: Family Medicine

## 2019-04-06 DIAGNOSIS — Z3041 Encounter for surveillance of contraceptive pills: Secondary | ICD-10-CM

## 2019-04-06 MED FILL — MONO-LINYAH 28 TABLET: 0.25-35 | 28 days supply | Qty: 28 | Fill #0

## 2019-04-13 ENCOUNTER — Telehealth: Payer: Self-pay

## 2019-04-13 MED FILL — FLUTICASONE PROP 50 MCG SPR: 50 | 30 days supply | Qty: 16 | Fill #2

## 2019-04-13 MED FILL — LISINOPRIL 20 MG TABLET: 20 | 30 days supply | Qty: 30 | Fill #6

## 2019-04-13 MED FILL — AMLODIPINE BESYLATE 5 MG TA: 5 | 30 days supply | Qty: 30 | Fill #6

## 2019-04-13 NOTE — Telephone Encounter (Signed)
-----   Message from Charlott Rakes, MD sent at 04/01/2019  5:39 PM EDT ----- Please inform the patient that labs are normal. Thank you.

## 2019-04-13 NOTE — Telephone Encounter (Signed)
Patient name and DOB has been verified Patient was informed of lab results. Patient had no questions.  

## 2019-05-06 DIAGNOSIS — Z20828 Contact with and (suspected) exposure to other viral communicable diseases: Secondary | ICD-10-CM | POA: Diagnosis not present

## 2019-05-12 MED FILL — MONO-LINYAH 28 TABLET: 0.25-35 | 28 days supply | Qty: 28 | Fill #1

## 2019-05-18 ENCOUNTER — Other Ambulatory Visit: Payer: Self-pay

## 2019-05-18 ENCOUNTER — Ambulatory Visit: Payer: BC Managed Care – PPO | Attending: Family Medicine | Admitting: Family Medicine

## 2019-05-18 ENCOUNTER — Encounter: Payer: Self-pay | Admitting: Family Medicine

## 2019-05-18 VITALS — BP 105/70 | HR 78 | Temp 98.0°F | Ht 63.0 in | Wt 213.8 lb

## 2019-05-18 DIAGNOSIS — R109 Unspecified abdominal pain: Secondary | ICD-10-CM | POA: Diagnosis not present

## 2019-05-18 DIAGNOSIS — R5383 Other fatigue: Secondary | ICD-10-CM

## 2019-05-18 DIAGNOSIS — E6609 Other obesity due to excess calories: Secondary | ICD-10-CM | POA: Diagnosis not present

## 2019-05-18 DIAGNOSIS — G43009 Migraine without aura, not intractable, without status migrainosus: Secondary | ICD-10-CM | POA: Diagnosis not present

## 2019-05-18 DIAGNOSIS — Z6839 Body mass index (BMI) 39.0-39.9, adult: Secondary | ICD-10-CM

## 2019-05-18 DIAGNOSIS — I10 Essential (primary) hypertension: Secondary | ICD-10-CM | POA: Diagnosis not present

## 2019-05-18 MED ORDER — LISINOPRIL 20 MG PO TABS: 20.0000 mg | ORAL_TABLET | Freq: Every day | ORAL | 6 refills | Status: DC

## 2019-05-18 MED ORDER — SUMATRIPTAN SUCCINATE 50 MG PO TABS: 100.0000 mg | ORAL_TABLET | Freq: Once | ORAL | 6 refills | Status: DC

## 2019-05-18 MED ORDER — TOPIRAMATE 50 MG PO TABS: 50.0000 mg | ORAL_TABLET | Freq: Two times a day (BID) | ORAL | 6 refills | Status: DC

## 2019-05-18 MED ORDER — AMLODIPINE BESYLATE 5 MG PO TABS: 5.0000 mg | ORAL_TABLET | Freq: Every day | ORAL | 6 refills | Status: DC

## 2019-05-18 MED FILL — LISINOPRIL 20 MG TAB: 20 | 30 days supply | Qty: 30 | Fill #0

## 2019-05-18 MED FILL — AMLODIPINE BESYLATE 5 MG TA: 5 | 30 days supply | Qty: 30 | Fill #0

## 2019-05-18 MED FILL — TOPIRAMATE 50 MG TABLET: 50 | 30 days supply | Qty: 60 | Fill #1

## 2019-05-18 MED FILL — SUMATRIPTAN SUCC 50 MG TAB: 50 | 30 days supply | Qty: 9 | Fill #0

## 2019-05-18 NOTE — Progress Notes (Signed)
Subjective:  Patient ID: Barbara Dunn, female    DOB: 1971/01/26  Age: 48 y.o. MRN: [id number]  CC: Hypertension   HPI Barbara Dunn is a 48 year-old female with a history of hypertension, migraine, obesity seen for a follow up of chronic medical conditions. She took a fall after she slid while cleaning her bath tub and sustained a left upper arm bruise which is healing. Migraines are controlled on her current regimen and she is doing well on her antihypertensive. She was treated for a UTI a month ago with Macrobid and feels she has not fully cleared it as she has residual right flank pain but no dysuria, fever, abdominal pain or urinary symptoms.    Past Medical History:  Diagnosis Date  . Anemia   . Arthritis   . Hypertension   . Migraines   . Obesity     Past Surgical History:  Procedure Laterality Date  . CARDIAC CATHETERIZATION N/A 08/02/2015   Procedure: Left Heart Cath and Coronary Angiography;  Surgeon: Troy Sine, MD;  Location: [facility];  Service: Cardiovascular;  Laterality: N/A;  . CERVIX LESION DESTRUCTION    . TUBAL LIGATION    . TUBAL LIGATION      Family History  Problem Relation Age of Onset  . Hypertension Mother   . Schizophrenia Mother   . Schizophrenia Sister   . Schizophrenia Brother   . Schizophrenia Brother     No Known Allergies  Outpatient Medications Prior to Visit  Medication Sig Dispense Refill  . aspirin 81 MG tablet Take 81 mg by mouth daily.     Marland Kitchen BIOTIN PO Take 2 tablets by mouth daily.     . cetirizine (ZYRTEC) 10 MG tablet Take 10 mg by mouth daily.    . chlorhexidine (PERIDEX) 0.12 % solution Use as directed 5 mLs in the mouth or throat as needed.     . clotrimazole (LOTRIMIN) 1 % cream Apply 1 application topically 2 (two) times daily. 30 g 0  . diphenhydrAMINE (BENADRYL) 25 mg capsule Take 25 mg by mouth every 6 (six) hours as needed for itching. Reported on 01/23/2016    . docusate sodium (COLACE) 100 MG capsule Take  100 mg by mouth as needed.     . fluticasone (FLONASE) 50 MCG/ACT nasal spray PLACE 2 SPRAYS INTO BOTH NOSTRILS DAILY. 16 g 2  . fluticasone (FLOVENT HFA) 110 MCG/ACT inhaler Inhale 1 puff into the lungs 2 (two) times daily. 1 Inhaler 2  . meclizine (ANTIVERT) 25 MG tablet Take 1 tablet (25 mg total) by mouth 3 (three) times daily as needed for dizziness. 60 tablet 1  . MONO-LINYAH 0.25-35 MG-MCG tablet TAKE 1 TABLET BY MOUTH DAILY. 28 tablet 2  . Multiple Vitamins-Minerals (MULTIVITAMIN ADULT PO) Take 1 tablet by mouth daily.     . VENTOLIN HFA 108 (90 Base) MCG/ACT inhaler INHALE 1 PUFF INTO THE LUNGS EVERY 6 HOURS AS NEEDED FOR WHEEZING OR SHORTNESS OF BREATH. 18 g 1  . amLODipine (NORVASC) 5 MG tablet Take 1 tablet (5 mg total) by mouth daily. 30 tablet 6  . lisinopril (PRINIVIL,ZESTRIL) 20 MG tablet Take 1 tablet (20 mg total) by mouth daily. 30 tablet 6  . topiramate (TOPAMAX) 50 MG tablet Take 1 tablet (50 mg total) by mouth 2 (two) times daily. 60 tablet 3  . benzonatate (TESSALON) 100 MG capsule Take 1 capsule (100 mg total) by mouth 3 (three) times daily. (Patient not taking: Reported on  05/18/2019) 30 capsule 0  . metroNIDAZOLE (METROGEL VAGINAL) 0.75 % vaginal gel Place 1 Applicatorful vaginally at bedtime. X 5 nights (Patient not taking: Reported on 01/15/2019) 70 g 0  . nitrofurantoin, macrocrystal-monohydrate, (MACROBID) 100 MG capsule Take 1 capsule (100 mg total) by mouth 2 (two) times daily. (Patient not taking: Reported on 05/18/2019) 14 capsule 0  . nystatin (NYSTATIN) powder Apply topically 2 (two) times daily. (Patient not taking: Reported on 01/15/2019) 45 g 1  . SUMAtriptan (IMITREX) 50 MG tablet Take 2 tablets (100 mg total) by mouth once for 1 dose. May repeat in 2 hours if headache persists, max 200mg /day 10 tablet 2   No facility-administered medications prior to visit.      ROS Review of Systems  Constitutional: Positive for fatigue. Negative for activity change and  appetite change.  HENT: Negative for congestion, sinus pressure and sore throat.   Eyes: Negative for visual disturbance.  Respiratory: Negative for cough, chest tightness, shortness of breath and wheezing.   Cardiovascular: Negative for chest pain and palpitations.  Gastrointestinal: Negative for abdominal distention, abdominal pain and constipation.  Endocrine: Negative for polydipsia.  Genitourinary: Positive for flank pain (R). Negative for dysuria and frequency.  Musculoskeletal: Negative for arthralgias and back pain.  Skin: Negative for rash.  Neurological: Negative for tremors, light-headedness and numbness.  Hematological: Does not bruise/bleed easily.  Psychiatric/Behavioral: Negative for agitation and behavioral problems.    Objective:  BP 105/70   Pulse 78   Temp 98 F (36.7 C) (Oral)   Ht 5\' 3"  (1.6 m)   Wt 213 lb 12.8 oz (97 kg)   SpO2 100%   BMI 37.87 kg/m   BP/Weight 05/18/2019 12/24/3297 11/25/2681  Systolic BP 419 622 297  Diastolic BP 70 90 87  Wt. (Lbs) 213.8 214 213  BMI 37.87 37.91 37.73      Physical Exam Constitutional:      Appearance: She is well-developed.  Cardiovascular:     Rate and Rhythm: Normal rate.     Heart sounds: Normal heart sounds. No murmur.  Pulmonary:     Effort: Pulmonary effort is normal.     Breath sounds: Normal breath sounds. No wheezing or rales.  Chest:     Chest wall: No tenderness.  Abdominal:     General: Bowel sounds are normal. There is no distension.     Palpations: Abdomen is soft. There is no mass.     Tenderness: There is no abdominal tenderness.  Musculoskeletal: Normal range of motion.     Comments: No CVA tenderness b/l   Skin:    Comments: Bruise on left upper arm  Neurological:     Mental Status: She is alert and oriented to person, place, and time.     CMP Latest Ref Rng & Units 03/31/2019 07/10/2018 01/01/2018  Glucose 65 - 99 mg/dL 73 77 78  BUN 6 - 24 mg/dL 12 8 11   Creatinine 0.57 - 1.00 mg/dL  1.19(H) 0.79 0.79  Sodium 134 - 144 mmol/L 141 142 139  Potassium 3.5 - 5.2 mmol/L 4.0 4.0 3.9  Chloride 96 - 106 mmol/L 108(H) 102 103  CO2 20 - 29 mmol/L 20 24 21   Calcium 8.7 - 10.2 mg/dL 8.8 8.8 8.9  Total Protein 6.0 - 8.5 g/dL 6.7 6.7 7.0  Total Bilirubin 0.0 - 1.2 mg/dL 0.3 0.4 0.3  Alkaline Phos 39 - 117 IU/L 50 51 45  AST 0 - 40 IU/L 18 18 21   ALT 0 - 32  IU/L 13 13 15     Lipid Panel     Component Value Date/Time   CHOL 185 01/01/2018 0948   TRIG 69 01/01/2018 0948   HDL 85 01/01/2018 0948   CHOLHDL 2.2 01/01/2018 0948   CHOLHDL 1.8 07/27/2016 0917   VLDL 14 07/27/2016 0917   LDLCALC 86 01/01/2018 0948    CBC    Component Value Date/Time   WBC 7.6 09/03/2017 1501   WBC 5.7 01/10/2016 0833   RBC 3.96 09/03/2017 1501   RBC 4.15 01/10/2016 0833   HGB 11.5 09/03/2017 1501   HCT 34.9 09/03/2017 1501   PLT 302 09/03/2017 1501   MCV 88 09/03/2017 1501   MCH 29.0 09/03/2017 1501   MCH 28.9 01/10/2016 0833   MCHC 33.0 09/03/2017 1501   MCHC 32.0 01/10/2016 0833   RDW 12.6 09/03/2017 1501   LYMPHSABS 3.1 09/03/2017 1501   MONOABS 0.7 10/19/2015 1008   EOSABS 0.3 09/03/2017 1501   BASOSABS 0.0 09/03/2017 1501    Lab Results  Component Value Date   HGBA1C 5.3 01/01/2018    Assessment & Plan:   1. Essential hypertension Controlled Counseled on blood pressure goal of less than 130/80, low-sodium, DASH diet, medication compliance, 150 minutes of moderate intensity exercise per week. Discussed medication compliance, adverse effects. - lisinopril (ZESTRIL) 20 MG tablet; Take 1 tablet (20 mg total) by mouth daily.  Dispense: 30 tablet; Refill: 6 - amLODipine (NORVASC) 5 MG tablet; Take 1 tablet (5 mg total) by mouth daily.  Dispense: 30 tablet; Refill: 6  2. Migraine without aura and without status migrainosus, not intractable Controlled - topiramate (TOPAMAX) 50 MG tablet; Take 1 tablet (50 mg total) by mouth 2 (two) times daily.  Dispense: 60 tablet; Refill: 6  - SUMAtriptan (IMITREX) 50 MG tablet; Take 2 tablets (100 mg total) by mouth once for 1 dose. May repeat in 2 hours if headache persists, max 200mg /day  Dispense: 10 tablet; Refill: 6  3. Class 2 obesity due to excess calories without serious comorbidity with body mass index (BMI) of 39.0 to 39.9 in adult Counseled on 150 minutes of exercise per week, healthy eating (including decreased daily intake of saturated fats, cholesterol, added sugars, sodium)  4. Other fatigue - VITAMIN D 25 Hydroxy (Vit-D Deficiency, Fractures) - CBC with Differential  5. Flank pain Previously treated for UTI a month ago Increase fluid intake - Urine Culture    Meds ordered this encounter  Medications  . lisinopril (ZESTRIL) 20 MG tablet    Sig: Take 1 tablet (20 mg total) by mouth daily.    Dispense:  30 tablet    Refill:  6  . amLODipine (NORVASC) 5 MG tablet    Sig: Take 1 tablet (5 mg total) by mouth daily.    Dispense:  30 tablet    Refill:  6  . topiramate (TOPAMAX) 50 MG tablet    Sig: Take 1 tablet (50 mg total) by mouth 2 (two) times daily.    Dispense:  60 tablet    Refill:  6  . SUMAtriptan (IMITREX) 50 MG tablet    Sig: Take 2 tablets (100 mg total) by mouth once for 1 dose. May repeat in 2 hours if headache persists, max 200mg /day    Dispense:  10 tablet    Refill:  6    Follow-up: Return in about 6 months (around 11/18/2019) for medical conditions.       Charlott Rakes, MD, FAAFP. [facility]  [place], [place]   05/18/2019, 12:07 PM

## 2019-05-19 ENCOUNTER — Other Ambulatory Visit: Payer: Self-pay | Admitting: Family Medicine

## 2019-05-19 LAB — CBC WITH DIFFERENTIAL/PLATELET
Basophils Absolute: 0.1 10*3/uL (ref 0.0–0.2)
Basos: 1 %
EOS (ABSOLUTE): 0.2 10*3/uL (ref 0.0–0.4)
Eos: 3 %
Hematocrit: 35.7 % (ref 34.0–46.6)
Hemoglobin: 11.3 g/dL (ref 11.1–15.9)
Immature Grans (Abs): 0 10*3/uL (ref 0.0–0.1)
Immature Granulocytes: 0 %
Lymphocytes Absolute: 2.4 10*3/uL (ref 0.7–3.1)
Lymphs: 36 %
MCH: 28.7 pg (ref 26.6–33.0)
MCHC: 31.7 g/dL (ref 31.5–35.7)
MCV: 91 fL (ref 79–97)
Monocytes Absolute: 0.7 10*3/uL (ref 0.1–0.9)
Monocytes: 10 %
Neutrophils Absolute: 3.4 10*3/uL (ref 1.4–7.0)
Neutrophils: 50 %
Platelets: 260 10*3/uL (ref 150–450)
RBC: 3.94 x10E6/uL (ref 3.77–5.28)
RDW: 12 % (ref 11.7–15.4)
WBC: 6.7 10*3/uL (ref 3.4–10.8)

## 2019-05-19 LAB — VITAMIN D 25 HYDROXY (VIT D DEFICIENCY, FRACTURES): Vit D, 25-Hydroxy: 29.9 ng/mL — ABNORMAL LOW (ref 30.0–100.0)

## 2019-05-19 MED ORDER — ERGOCALCIFEROL 1.25 MG (50000 UT) PO CAPS: 50000.0000 [IU] | ORAL_CAPSULE | ORAL | 1 refills | Status: DC

## 2019-05-19 MED FILL — VIT D2 1.25 MG (50,000 UNIT: 1.25 MG | 28 days supply | Qty: 4 | Fill #0

## 2019-05-20 LAB — URINE CULTURE

## 2019-05-25 ENCOUNTER — Ambulatory Visit: Payer: BC Managed Care – PPO | Admitting: Family Medicine

## 2019-06-15 MED FILL — AMLODIPINE BESYLATE 5 MG TA: 5 | 30 days supply | Qty: 30 | Fill #1

## 2019-06-15 MED FILL — LISINOPRIL 20 MG TABLET: 20 | 30 days supply | Qty: 30 | Fill #1

## 2019-06-15 MED FILL — VIT D2 1.25 MG (50,000 UNIT: 1.25 MG | 28 days supply | Qty: 4 | Fill #1

## 2019-06-15 MED FILL — MONO-LINYAH 28 TABLET: 0.25-35 | 28 days supply | Qty: 28 | Fill #2

## 2019-06-17 ENCOUNTER — Other Ambulatory Visit: Payer: Self-pay | Admitting: Family Medicine

## 2019-06-17 MED FILL — FLUTICASONE PROP 50 MCG SPR: 50 | 30 days supply | Qty: 16 | Fill #0

## 2019-06-26 MED FILL — FLUTICASONE PROP 50 MCG SPR: 50 | 30 days supply | Qty: 16 | Fill #0

## 2019-07-03 MED FILL — FLUCONAZOLE 150 MG TABLET: 150 | 1 days supply | Qty: 1 | Fill #0

## 2019-07-13 ENCOUNTER — Other Ambulatory Visit: Payer: Self-pay | Admitting: Family Medicine

## 2019-07-13 DIAGNOSIS — Z3041 Encounter for surveillance of contraceptive pills: Secondary | ICD-10-CM

## 2019-07-13 MED FILL — LISINOPRIL 20 MG TABLET: 20 | 30 days supply | Qty: 30 | Fill #2

## 2019-07-13 MED FILL — VIT D2 1.25 MG (50,000 UNIT: 1.25 MG | 28 days supply | Qty: 4 | Fill #2

## 2019-07-13 MED FILL — AMLODIPINE BESYLATE 5 MG TA: 5 | 30 days supply | Qty: 30 | Fill #2

## 2019-07-13 MED FILL — MONO-LINYAH 28 TABLET: 0.25-35 | 28 days supply | Qty: 28 | Fill #0

## 2019-07-27 MED FILL — TOPIRAMATE 50 MG TABLET: 50 | 30 days supply | Qty: 60 | Fill #2

## 2019-08-10 MED FILL — MONO-LINYAH 28 TABLET: 0.25-35 | 28 days supply | Qty: 28 | Fill #1

## 2019-08-10 MED FILL — AMLODIPINE BESYLATE 5 MG TA: 5 | 30 days supply | Qty: 30 | Fill #3

## 2019-08-10 MED FILL — VIT D2 1.25 MG (50,000 UNIT: 1.25 MG | 28 days supply | Qty: 4 | Fill #3

## 2019-08-10 MED FILL — LISINOPRIL 20 MG TABLET: 20 | 30 days supply | Qty: 30 | Fill #3

## 2019-09-07 MED FILL — VIT D2 1.25 MG (50,000 UNIT: 1.25 MG | 13 days supply | Qty: 2 | Fill #4

## 2019-09-11 MED FILL — MONO-LINYAH 28 TABLET: 0.25-35 | 28 days supply | Qty: 28 | Fill #2

## 2019-09-14 MED FILL — AMLODIPINE BESYLATE 5 MG TA: 5 | 30 days supply | Qty: 30 | Fill #4

## 2019-09-14 MED FILL — LISINOPRIL 20 MG TABLET: 20 | 30 days supply | Qty: 30 | Fill #4

## 2019-09-21 MED FILL — TOPIRAMATE 50 MG TABLET: 50 | 30 days supply | Qty: 60 | Fill #0

## 2019-09-25 ENCOUNTER — Ambulatory Visit: Payer: BC Managed Care – PPO | Admitting: Podiatry

## 2019-09-25 ENCOUNTER — Other Ambulatory Visit: Payer: Self-pay | Admitting: Podiatry

## 2019-09-25 ENCOUNTER — Ambulatory Visit (INDEPENDENT_AMBULATORY_CARE_PROVIDER_SITE_OTHER): Payer: BC Managed Care – PPO

## 2019-09-25 DIAGNOSIS — L03032 Cellulitis of left toe: Secondary | ICD-10-CM

## 2019-09-25 DIAGNOSIS — L600 Ingrowing nail: Secondary | ICD-10-CM

## 2019-09-25 DIAGNOSIS — M79672 Pain in left foot: Secondary | ICD-10-CM

## 2019-09-28 ENCOUNTER — Other Ambulatory Visit: Payer: Self-pay | Admitting: Family Medicine

## 2019-09-28 DIAGNOSIS — Z1231 Encounter for screening mammogram for malignant neoplasm of breast: Secondary | ICD-10-CM

## 2019-10-12 ENCOUNTER — Other Ambulatory Visit: Payer: Self-pay | Admitting: Family Medicine

## 2019-10-12 DIAGNOSIS — J302 Other seasonal allergic rhinitis: Secondary | ICD-10-CM

## 2019-10-12 DIAGNOSIS — Z3041 Encounter for surveillance of contraceptive pills: Secondary | ICD-10-CM

## 2019-10-12 MED FILL — MONO-LINYAH 28 TABLET: 0.25-35 | 28 days supply | Qty: 28 | Fill #0

## 2019-10-12 MED FILL — AMLODIPINE BESYLATE 5 MG TA: 5 | 30 days supply | Qty: 30 | Fill #5

## 2019-10-12 MED FILL — LISINOPRIL 20 MG TABLET: 20 | 30 days supply | Qty: 30 | Fill #5

## 2019-10-12 MED FILL — FLUTICASONE PROP 50 MCG SPR: 50 | 30 days supply | Qty: 16 | Fill #1

## 2019-10-12 MED FILL — BENZONATATE 100 MG CAPS: 100 | 10 days supply | Qty: 30 | Fill #0

## 2019-10-25 NOTE — Progress Notes (Signed)
Subjective:  Patient ID: Barbara Dunn, female    DOB: 1970/11/25,  MRN: [id number]  Chief Complaint  Patient presents with  . Nail Problem    left hallux, pt states possible infection - lateral side . pt declines any drainage     49 y.o. female presents with the above complaint. Hx as above.   Review of Systems: Negative except as noted in the HPI. Denies N/V/F/Ch.  Past Medical History:  Diagnosis Date  . Anemia   . Arthritis   . Hypertension   . Migraines   . Obesity     Current Outpatient Medications:  .  amLODipine (NORVASC) 5 MG tablet, Take 1 tablet (5 mg total) by mouth daily., Disp: 30 tablet, Rfl: 6 .  aspirin 81 MG tablet, Take 81 mg by mouth daily. , Disp: , Rfl:  .  BIOTIN PO, Take 2 tablets by mouth daily. , Disp: , Rfl:  .  cetirizine (ZYRTEC) 10 MG tablet, Take 10 mg by mouth daily., Disp: , Rfl:  .  chlorhexidine (PERIDEX) 0.12 % solution, Use as directed 5 mLs in the mouth or throat as needed. , Disp: , Rfl:  .  clotrimazole (LOTRIMIN) 1 % cream, Apply 1 application topically 2 (two) times daily., Disp: 30 g, Rfl: 0 .  diphenhydrAMINE (BENADRYL) 25 mg capsule, Take 25 mg by mouth every 6 (six) hours as needed for itching. Reported on 01/23/2016, Disp: , Rfl:  .  docusate sodium (COLACE) 100 MG capsule, Take 100 mg by mouth as needed. , Disp: , Rfl:  .  ergocalciferol (DRISDOL) 1.25 MG (50000 UT) capsule, Take 1 capsule (50,000 Units total) by mouth once a week., Disp: 9 capsule, Rfl: 1 .  fluticasone (FLONASE) 50 MCG/ACT nasal spray, PLACE 2 SPRAYS INTO BOTH NOSTRILS DAILY., Disp: 16 g, Rfl: 2 .  fluticasone (FLOVENT HFA) 110 MCG/ACT inhaler, Inhale 1 puff into the lungs 2 (two) times daily., Disp: 1 Inhaler, Rfl: 2 .  lisinopril (ZESTRIL) 20 MG tablet, Take 1 tablet (20 mg total) by mouth daily., Disp: 30 tablet, Rfl: 6 .  meclizine (ANTIVERT) 25 MG tablet, Take 1 tablet (25 mg total) by mouth 3 (three) times daily as needed for dizziness., Disp: 60 tablet, Rfl:  1 .  metroNIDAZOLE (METROGEL VAGINAL) 0.75 % vaginal gel, Place 1 Applicatorful vaginally at bedtime. X 5 nights, Disp: 70 g, Rfl: 0 .  Multiple Vitamins-Minerals (MULTIVITAMIN ADULT PO), Take 1 tablet by mouth daily. , Disp: , Rfl:  .  nitrofurantoin, macrocrystal-monohydrate, (MACROBID) 100 MG capsule, Take 1 capsule (100 mg total) by mouth 2 (two) times daily., Disp: 14 capsule, Rfl: 0 .  nystatin (NYSTATIN) powder, Apply topically 2 (two) times daily., Disp: 45 g, Rfl: 1 .  topiramate (TOPAMAX) 50 MG tablet, Take 1 tablet (50 mg total) by mouth 2 (two) times daily., Disp: 60 tablet, Rfl: 6 .  VENTOLIN HFA 108 (90 Base) MCG/ACT inhaler, INHALE 1 PUFF INTO THE LUNGS EVERY 6 HOURS AS NEEDED FOR WHEEZING OR SHORTNESS OF BREATH., Disp: 18 g, Rfl: 1 .  benzonatate (TESSALON) 100 MG capsule, TAKE 1 CAPSULE (100 MG TOTAL) BY MOUTH 3 (THREE) TIMES DAILY., Disp: 30 capsule, Rfl: 0 .  MONO-LINYAH 0.25-35 MG-MCG tablet, TAKE 1 TABLET BY MOUTH DAILY., Disp: 28 tablet, Rfl: 0 .  SUMAtriptan (IMITREX) 50 MG tablet, Take 2 tablets (100 mg total) by mouth once for 1 dose. May repeat in 2 hours if headache persists, max 200mg /day, Disp: 10 tablet, Rfl: 6  Social History  Tobacco Use  Smoking Status Former Smoker  . Quit date: 12/15/2003  . Years since quitting: 15.8  Smokeless Tobacco Never Used  Tobacco Comment   quit 10 years ago    No Known Allergies Objective:  There were no vitals filed for this visit. There is no height or weight on file to calculate BMI. Constitutional Well developed. Well nourished.  Vascular Dorsalis pedis pulses palpable bilaterally. Posterior tibial pulses palpable bilaterally. Capillary refill normal to all digits.  No cyanosis or clubbing noted. Pedal hair growth normal.  Neurologic Normal speech. Oriented to person, place, and time. Epicritic sensation to light touch grossly present bilaterally.  Dermatologic Painful ingrowing nail at lateral nail borders of the  hallux nail left. No other open wounds. No skin lesions.  Orthopedic: Normal joint ROM without pain or crepitus bilaterally. No visible deformities. No bony tenderness.   Radiographs: None Assessment:   1. Left foot pain    Plan:  Patient was evaluated and treated and all questions answered.  Ingrown Nail, left -Nail gently debrided of ingrown nail on the lateral side.  Patient to call back should the area recur for further nail procedure  No follow-ups on file.

## 2019-11-02 ENCOUNTER — Other Ambulatory Visit (HOSPITAL_COMMUNITY)
Admission: RE | Admit: 2019-11-02 | Discharge: 2019-11-02 | Disposition: A | Discharge status: Home / Self Care | Payer: BC Managed Care – PPO | Source: Ambulatory Visit | Attending: Family Medicine | Admitting: Family Medicine

## 2019-11-02 ENCOUNTER — Ambulatory Visit (HOSPITAL_BASED_OUTPATIENT_CLINIC_OR_DEPARTMENT_OTHER): Payer: BC Managed Care – PPO | Admitting: Family Medicine

## 2019-11-02 ENCOUNTER — Encounter: Payer: Self-pay | Admitting: Family Medicine

## 2019-11-02 ENCOUNTER — Other Ambulatory Visit: Payer: Self-pay

## 2019-11-02 VITALS — BP 122/78 | HR 78 | Temp 98.0°F | Ht 63.0 in | Wt 208.0 lb

## 2019-11-02 DIAGNOSIS — Z124 Encounter for screening for malignant neoplasm of cervix: Secondary | ICD-10-CM

## 2019-11-02 DIAGNOSIS — B373 Candidiasis of vulva and vagina: Secondary | ICD-10-CM | POA: Diagnosis not present

## 2019-11-02 DIAGNOSIS — Z13228 Encounter for screening for other metabolic disorders: Secondary | ICD-10-CM

## 2019-11-02 DIAGNOSIS — B3731 Acute candidiasis of vulva and vagina: Secondary | ICD-10-CM

## 2019-11-02 DIAGNOSIS — Z113 Encounter for screening for infections with a predominantly sexual mode of transmission: Secondary | ICD-10-CM | POA: Insufficient documentation

## 2019-11-02 DIAGNOSIS — Z0000 Encounter for general adult medical examination without abnormal findings: Secondary | ICD-10-CM

## 2019-11-02 MED ORDER — FLUCONAZOLE 150 MG PO TABS: 150.0000 mg | ORAL_TABLET | Freq: Once | ORAL | 0 refills | Status: AC

## 2019-11-02 MED FILL — FLUCONAZOLE 150 MG TABLET: 150 | 1 days supply | Qty: 1 | Fill #0

## 2019-11-02 NOTE — Patient Instructions (Signed)
Health Maintenance, Female Adopting a healthy lifestyle and getting preventive care are important in promoting health and wellness. Ask your health care provider about:  The right schedule for you to have regular tests and exams.  Things you can do on your own to prevent diseases and keep yourself healthy. What should I know about diet, weight, and exercise? Eat a healthy diet   Eat a diet that includes plenty of vegetables, fruits, low-fat dairy products, and lean protein.  Do not eat a lot of foods that are high in solid fats, added sugars, or sodium. Maintain a healthy weight Body mass index (BMI) is used to identify weight problems. It estimates body fat based on height and weight. Your health care provider can help determine your BMI and help you achieve or maintain a healthy weight. Get regular exercise Get regular exercise. This is one of the most important things you can do for your health. Most adults should:  Exercise for at least 150 minutes each week. The exercise should increase your heart rate and make you sweat (moderate-intensity exercise).  Do strengthening exercises at least twice a week. This is in addition to the moderate-intensity exercise.  Spend less time sitting. Even light physical activity can be beneficial. Watch cholesterol and blood lipids Have your blood tested for lipids and cholesterol at 47 years of age, then have this test every 5 years. Have your cholesterol levels checked more often if:  Your lipid or cholesterol levels are high.  You are older than 47 years of age.  You are at high risk for heart disease. What should I know about cancer screening? Depending on your health history and family history, you may need to have cancer screening at various ages. This may include screening for:  Breast cancer.  Cervical cancer.  Colorectal cancer.  Skin cancer.  Lung cancer. What should I know about heart disease, diabetes, and high blood  pressure? Blood pressure and heart disease  High blood pressure causes heart disease and increases the risk of stroke. This is more likely to develop in people who have high blood pressure readings, are of African descent, or are overweight.  Have your blood pressure checked: ? Every 3-5 years if you are 18-39 years of age. ? Every year if you are 40 years old or older. Diabetes Have regular diabetes screenings. This checks your fasting blood sugar level. Have the screening done:  Once every three years after age 40 if you are at a normal weight and have a low risk for diabetes.  More often and at a younger age if you are overweight or have a high risk for diabetes. What should I know about preventing infection? Hepatitis B If you have a higher risk for hepatitis B, you should be screened for this virus. Talk with your health care provider to find out if you are at risk for hepatitis B infection. Hepatitis C Testing is recommended for:  Everyone born from 1945 through 1965.  Anyone with known risk factors for hepatitis C. Sexually transmitted infections (STIs)  Get screened for STIs, including gonorrhea and chlamydia, if: ? You are sexually active and are younger than 47 years of age. ? You are older than 47 years of age and your health care provider tells you that you are at risk for this type of infection. ? Your sexual activity has changed since you were last screened, and you are at increased risk for chlamydia or gonorrhea. Ask your health care provider if   you are at risk.  Ask your health care provider about whether you are at high risk for HIV. Your health care provider may recommend a prescription medicine to help prevent HIV infection. If you choose to take medicine to prevent HIV, you should first get tested for HIV. You should then be tested every 3 months for as long as you are taking the medicine. Pregnancy  If you are about to stop having your period (premenopausal) and  you may become pregnant, seek counseling before you get pregnant.  Take 400 to 800 micrograms (mcg) of folic acid every day if you become pregnant.  Ask for birth control (contraception) if you want to prevent pregnancy. Osteoporosis and menopause Osteoporosis is a disease in which the bones lose minerals and strength with aging. This can result in bone fractures. If you are 65 years old or older, or if you are at risk for osteoporosis and fractures, ask your health care provider if you should:  Be screened for bone loss.  Take a calcium or vitamin D supplement to lower your risk of fractures.  Be given hormone replacement therapy (HRT) to treat symptoms of menopause. Follow these instructions at home: Lifestyle  Do not use any products that contain nicotine or tobacco, such as cigarettes, e-cigarettes, and chewing tobacco. If you need help quitting, ask your health care provider.  Do not use street drugs.  Do not share needles.  Ask your health care provider for help if you need support or information about quitting drugs. Alcohol use  Do not drink alcohol if: ? Your health care provider tells you not to drink. ? You are pregnant, may be pregnant, or are planning to become pregnant.  If you drink alcohol: ? Limit how much you use to 0-1 drink a day. ? Limit intake if you are breastfeeding.  Be aware of how much alcohol is in your drink. In the [place] one drink equals one 12 oz bottle of beer (355 mL), one 5 oz glass of wine (148 mL), or one 1 oz glass of hard liquor (44 mL). General instructions  Schedule regular health, dental, and eye exams.  Stay current with your vaccines.  Tell your health care provider if: ? You often feel depressed. ? You have ever been abused or do not feel safe at home. Summary  Adopting a healthy lifestyle and getting preventive care are important in promoting health and wellness.  Follow your health care provider's instructions about healthy  diet, exercising, and getting tested or screened for diseases.  Follow your health care provider's instructions on monitoring your cholesterol and blood pressure. This information is not intended to replace advice given to you by your health care provider. Make sure you discuss any questions you have with your health care provider. Document Revised: 10/01/2018 Document Reviewed: 10/01/2018 Elsevier Patient Education  2020 [employer]  

## 2019-11-02 NOTE — Progress Notes (Signed)
Subjective:  Patient ID: Barbara Dunn, female    DOB: 17-Sep-1971  Age: 49 y.o. MRN: [id number]  CC: Gynecologic Exam   HPI Barbara Dunn presents is a 49 year-old female with a history of hypertension, migraine, obesity seen for a follow up of chronic medical conditions.  She does have chronic chest pain for which cardiac work-up has been negative and thought to be atypical versus anxiety related. She has an appointment for mammogram later this month.  Past Medical History:  Diagnosis Date  . Anemia   . Arthritis   . Hypertension   . Migraines   . Obesity     Past Surgical History:  Procedure Laterality Date  . CARDIAC CATHETERIZATION N/A 08/02/2015   Procedure: Left Heart Cath and Coronary Angiography;  Surgeon: Troy Sine, MD;  Location: [facility];  Service: Cardiovascular;  Laterality: N/A;  . CERVIX LESION DESTRUCTION    . TUBAL LIGATION    . TUBAL LIGATION      Family History  Problem Relation Age of Onset  . Hypertension Mother   . Schizophrenia Mother   . Schizophrenia Sister   . Schizophrenia Brother   . Schizophrenia Brother     No Known Allergies  Outpatient Medications Prior to Visit  Medication Sig Dispense Refill  . amLODipine (NORVASC) 5 MG tablet Take 1 tablet (5 mg total) by mouth daily. 30 tablet 6  . aspirin 81 MG tablet Take 81 mg by mouth daily.     Marland Kitchen BIOTIN PO Take 2 tablets by mouth daily.     . cetirizine (ZYRTEC) 10 MG tablet Take 10 mg by mouth daily.    . chlorhexidine (PERIDEX) 0.12 % solution Use as directed 5 mLs in the mouth or throat as needed.     . clotrimazole (LOTRIMIN) 1 % cream Apply 1 application topically 2 (two) times daily. 30 g 0  . diphenhydrAMINE (BENADRYL) 25 mg capsule Take 25 mg by mouth every 6 (six) hours as needed for itching. Reported on 01/23/2016    . docusate sodium (COLACE) 100 MG capsule Take 100 mg by mouth as needed.     . ergocalciferol (DRISDOL) 1.25 MG (50000 UT) capsule Take 1 capsule (50,000 Units  total) by mouth once a week. 9 capsule 1  . fluticasone (FLONASE) 50 MCG/ACT nasal spray PLACE 2 SPRAYS INTO BOTH NOSTRILS DAILY. 16 g 2  . fluticasone (FLOVENT HFA) 110 MCG/ACT inhaler Inhale 1 puff into the lungs 2 (two) times daily. 1 Inhaler 2  . lisinopril (ZESTRIL) 20 MG tablet Take 1 tablet (20 mg total) by mouth daily. 30 tablet 6  . meclizine (ANTIVERT) 25 MG tablet Take 1 tablet (25 mg total) by mouth 3 (three) times daily as needed for dizziness. 60 tablet 1  . MONO-LINYAH 0.25-35 MG-MCG tablet TAKE 1 TABLET BY MOUTH DAILY. 28 tablet 0  . Multiple Vitamins-Minerals (MULTIVITAMIN ADULT PO) Take 1 tablet by mouth daily.     Marland Kitchen topiramate (TOPAMAX) 50 MG tablet Take 1 tablet (50 mg total) by mouth 2 (two) times daily. 60 tablet 6  . VENTOLIN HFA 108 (90 Base) MCG/ACT inhaler INHALE 1 PUFF INTO THE LUNGS EVERY 6 HOURS AS NEEDED FOR WHEEZING OR SHORTNESS OF BREATH. 18 g 1  . benzonatate (TESSALON) 100 MG capsule TAKE 1 CAPSULE (100 MG TOTAL) BY MOUTH 3 (THREE) TIMES DAILY. (Patient not taking: Reported on 11/02/2019) 30 capsule 0  . metroNIDAZOLE (METROGEL VAGINAL) 0.75 % vaginal gel Place 1 Applicatorful vaginally at  bedtime. X 5 nights (Patient not taking: Reported on 11/02/2019) 70 g 0  . nitrofurantoin, macrocrystal-monohydrate, (MACROBID) 100 MG capsule Take 1 capsule (100 mg total) by mouth 2 (two) times daily. (Patient not taking: Reported on 11/02/2019) 14 capsule 0  . nystatin (NYSTATIN) powder Apply topically 2 (two) times daily. (Patient not taking: Reported on 11/02/2019) 45 g 1  . SUMAtriptan (IMITREX) 50 MG tablet Take 2 tablets (100 mg total) by mouth once for 1 dose. May repeat in 2 hours if headache persists, max 200mg /day 10 tablet 6   No facility-administered medications prior to visit.     ROS Review of Systems  Constitutional: Negative for activity change, appetite change and fatigue.  HENT: Negative for congestion, sinus pressure and sore throat.   Eyes: Negative for  visual disturbance.  Respiratory: Positive for chest tightness. Negative for cough, shortness of breath and wheezing.   Cardiovascular: Negative for chest pain and palpitations.  Gastrointestinal: Negative for abdominal distention, abdominal pain and constipation.  Endocrine: Negative for polydipsia.  Genitourinary: Positive for vaginal discharge. Negative for dysuria and frequency.  Musculoskeletal: Negative for arthralgias and back pain.  Skin: Negative for rash.  Neurological: Negative for tremors, light-headedness and numbness.  Hematological: Does not bruise/bleed easily.  Psychiatric/Behavioral: Negative for agitation and behavioral problems.    Objective:  BP 122/78   Pulse 78   Temp 98 F (36.7 C) (Oral)   Ht 5\' 3"  (1.6 m)   Wt 208 lb (94.3 kg)   SpO2 100%   BMI 36.85 kg/m   BP/Weight 11/02/2019 [id number]  Systolic BP 123XX123 [id number]  Diastolic BP 78 70 90  Wt. (Lbs) 208 213.8 214  BMI 36.85 37.87 37.91      Physical Exam Constitutional:      General: She is not in acute distress.    Appearance: She is well-developed. She is not diaphoretic.  HENT:     Head: Normocephalic.     Right Ear: External ear normal.     Left Ear: External ear normal.     Nose: Nose normal.  Eyes:     Conjunctiva/sclera: Conjunctivae normal.     Pupils: Pupils are equal, round, and reactive to light.  Neck:     Vascular: No JVD.  Cardiovascular:     Rate and Rhythm: Normal rate and regular rhythm.     Heart sounds: Normal heart sounds. No murmur. No gallop.   Pulmonary:     Effort: Pulmonary effort is normal. No respiratory distress.     Breath sounds: Normal breath sounds. No wheezing or rales.  Chest:     Chest wall: No tenderness.  Abdominal:     General: Bowel sounds are normal. There is no distension.     Palpations: Abdomen is soft. There is no mass.     Tenderness: There is no abdominal tenderness.  Genitourinary:    Comments: External genitalia-normal Cheesy  vaginal discharge Cervix is central, no CMT, adnexa is normal. Musculoskeletal:        General: No tenderness. Normal range of motion.     Cervical back: Normal range of motion.  Skin:    General: Skin is warm and dry.  Neurological:     Mental Status: She is alert and oriented to person, place, and time.     Deep Tendon Reflexes: Reflexes are normal and symmetric.     CMP Latest Ref Rng & Units 03/31/2019 07/10/2018 01/01/2018  Glucose 65 - 99 mg/dL 73 77 78  BUN  6 - 24 mg/dL 12 8 11   Creatinine 0.57 - 1.00 mg/dL 1.19(H) 0.79 0.79  Sodium 134 - 144 mmol/L 141 142 139  Potassium 3.5 - 5.2 mmol/L 4.0 4.0 3.9  Chloride 96 - 106 mmol/L 108(H) 102 103  CO2 20 - 29 mmol/L 20 24 21   Calcium 8.7 - 10.2 mg/dL 8.8 8.8 8.9  Total Protein 6.0 - 8.5 g/dL 6.7 6.7 7.0  Total Bilirubin 0.0 - 1.2 mg/dL 0.3 0.4 0.3  Alkaline Phos 39 - 117 IU/L 50 51 45  AST 0 - 40 IU/L 18 18 21   ALT 0 - 32 IU/L 13 13 15     Lipid Panel     Component Value Date/Time   CHOL 185 01/01/2018 0948   TRIG 69 01/01/2018 0948   HDL 85 01/01/2018 0948   CHOLHDL 2.2 01/01/2018 0948   CHOLHDL 1.8 07/27/2016 0917   VLDL 14 07/27/2016 0917   LDLCALC 86 01/01/2018 0948    CBC    Component Value Date/Time   WBC 6.7 05/18/2019 1159   WBC 5.7 01/10/2016 0833   RBC 3.94 05/18/2019 1159   RBC 4.15 01/10/2016 0833   HGB 11.3 05/18/2019 1159   HCT 35.7 05/18/2019 1159   PLT 260 05/18/2019 1159   MCV 91 05/18/2019 1159   MCH 28.7 05/18/2019 1159   MCH 28.9 01/10/2016 0833   MCHC 31.7 05/18/2019 1159   MCHC 32.0 01/10/2016 0833   RDW 12.0 05/18/2019 1159   LYMPHSABS 2.4 05/18/2019 1159   MONOABS 0.7 10/19/2015 1008   EOSABS 0.2 05/18/2019 1159   BASOSABS 0.1 05/18/2019 1159    Lab Results  Component Value Date   HGBA1C 5.3 01/01/2018    Assessment & Plan:   1. Annual physical exam Counseled on 150 minutes of exercise per week, healthy eating (including decreased daily intake of saturated fats, cholesterol,  added sugars, sodium), STI prevention, routine healthcare maintenance.   2. Screening for cervical cancer - Cytology - PAP([place])  3. Screening for metabolic disorder - Lipid panel - Complete Metabolic Panel with GFR  4. Vaginal candidiasis - fluconazole (DIFLUCAN) 150 MG tablet; Take 1 tablet (150 mg total) by mouth once for 1 dose.  Dispense: 1 tablet; Refill: 0  5. Screening for STD (sexually transmitted disease) - Cervicovaginal ancillary only   Health Care Maintenance: Declines flu shot Meds ordered this encounter  Medications  . fluconazole (DIFLUCAN) 150 MG tablet    Sig: Take 1 tablet (150 mg total) by mouth once for 1 dose.    Dispense:  1 tablet    Refill:  0    Follow-up: Return in about 3 months (around 01/31/2020) for Chronic medical conditions.       Charlott Rakes, MD, FAAFP. [facility] [place], [place]   11/02/2019, 9:37 AM

## 2019-11-03 LAB — CERVICOVAGINAL ANCILLARY ONLY
Bacterial Vaginitis (gardnerella): NEGATIVE
Candida Glabrata: NEGATIVE
Candida Vaginitis: NEGATIVE
Chlamydia: NEGATIVE
Comment: NEGATIVE
Comment: NEGATIVE
Comment: NEGATIVE
Comment: NEGATIVE
Comment: NEGATIVE
Comment: NORMAL
Neisseria Gonorrhea: NEGATIVE
Trichomonas: NEGATIVE

## 2019-11-03 LAB — CMP14+EGFR
ALT: 11 IU/L (ref 0–32)
AST: 16 IU/L (ref 0–40)
Albumin/Globulin Ratio: 1.9 (ref 1.2–2.2)
Albumin: 4.3 g/dL (ref 3.8–4.8)
Alkaline Phosphatase: 58 IU/L (ref 39–117)
BUN/Creatinine Ratio: 6 — ABNORMAL LOW (ref 9–23)
BUN: 6 mg/dL (ref 6–24)
Bilirubin Total: 0.3 mg/dL (ref 0.0–1.2)
CO2: 21 mmol/L (ref 20–29)
Calcium: 9 mg/dL (ref 8.7–10.2)
Chloride: 104 mmol/L (ref 96–106)
Creatinine, Ser: 0.95 mg/dL (ref 0.57–1.00)
GFR calc Af Amer: 82 mL/min/{1.73_m2} (ref >59)
GFR calc non Af Amer: 72 mL/min/{1.73_m2} (ref >59)
Globulin, Total: 2.3 g/dL (ref 1.5–4.5)
Glucose: 81 mg/dL (ref 65–99)
Potassium: 3.7 mmol/L (ref 3.5–5.2)
Sodium: 137 mmol/L (ref 134–144)
Total Protein: 6.6 g/dL (ref 6.0–8.5)

## 2019-11-03 LAB — LIPID PANEL
Chol/HDL Ratio: 2.1 ratio (ref 0.0–4.4)
Cholesterol, Total: 184 mg/dL (ref 100–199)
HDL: 86 mg/dL (ref >39)
LDL Chol Calc (NIH): 85 mg/dL (ref 0–99)
Triglycerides: 70 mg/dL (ref 0–149)
VLDL Cholesterol Cal: 13 mg/dL (ref 5–40)

## 2019-11-04 LAB — CYTOLOGY - PAP
Comment: NEGATIVE
Diagnosis: NEGATIVE
Diagnosis: REACTIVE
High risk HPV: NEGATIVE

## 2019-11-05 ENCOUNTER — Telehealth: Payer: Self-pay

## 2019-11-05 NOTE — Telephone Encounter (Signed)
Patient name and DOB has been verified Patient was informed of lab results. Patient had no questions.  

## 2019-11-05 NOTE — Telephone Encounter (Signed)
-----   Message from Charlott Rakes, MD sent at 11/03/2019  9:41 AM EST ----- Please inform the patient that labs are normal. Thank you.

## 2019-11-09 ENCOUNTER — Other Ambulatory Visit: Payer: Self-pay | Admitting: Family Medicine

## 2019-11-09 DIAGNOSIS — Z3041 Encounter for surveillance of contraceptive pills: Secondary | ICD-10-CM

## 2019-11-09 MED FILL — AMLODIPINE BESYLATE 5 MG TA: 5 | 30 days supply | Qty: 30 | Fill #6

## 2019-11-09 MED FILL — MONO-LINYAH 28 TABLET: 0.25-35 | 28 days supply | Qty: 28 | Fill #0

## 2019-11-09 MED FILL — LISINOPRIL 20 MG TABLET: 20 | 30 days supply | Qty: 30 | Fill #6

## 2019-11-16 MED FILL — FLUTICASONE PROP 50 MCG SPR: 50 | 30 days supply | Qty: 16 | Fill #2

## 2019-11-16 MED FILL — TOPIRAMATE 50 MG TABLET: 50 | 30 days supply | Qty: 60 | Fill #1

## 2019-11-17 ENCOUNTER — Ambulatory Visit
Admission: RE | Admit: 2019-11-17 | Discharge: 2019-11-17 | Disposition: A | Discharge status: Home / Self Care | Payer: BC Managed Care – PPO | Source: Ambulatory Visit | Attending: Family Medicine | Admitting: Family Medicine

## 2019-11-17 ENCOUNTER — Other Ambulatory Visit: Payer: Self-pay

## 2019-11-17 DIAGNOSIS — Z1231 Encounter for screening mammogram for malignant neoplasm of breast: Secondary | ICD-10-CM | POA: Diagnosis not present

## 2019-11-23 ENCOUNTER — Telehealth: Payer: Self-pay | Admitting: Family Medicine

## 2019-11-23 MED FILL — SUMATRIPTAN SUCC 50 MG TAB: 50 | 30 days supply | Qty: 9 | Fill #1

## 2019-11-23 NOTE — Telephone Encounter (Signed)
Patient wants to know if its ok for her the take the covid-19 shot because of her health. [phone]

## 2019-11-25 NOTE — Telephone Encounter (Signed)
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 Please advice!

## 2019-11-26 NOTE — Telephone Encounter (Signed)
I spoke with the patient over the phone, answered her questions and provided reassurance regarding the COVID-19 vaccine.

## 2019-12-03 ENCOUNTER — Other Ambulatory Visit: Payer: Self-pay | Admitting: Family Medicine

## 2019-12-03 DIAGNOSIS — J302 Other seasonal allergic rhinitis: Secondary | ICD-10-CM

## 2019-12-03 MED FILL — BENZONATATE 100 MG CAPS: 100 | 10 days supply | Qty: 30 | Fill #0

## 2019-12-07 ENCOUNTER — Ambulatory Visit (HOSPITAL_COMMUNITY)
Admission: EM | Admit: 2019-12-07 | Discharge: 2019-12-07 | Disposition: A | Discharge status: Home / Self Care | Payer: BC Managed Care – PPO | Attending: Family Medicine | Admitting: Family Medicine

## 2019-12-07 ENCOUNTER — Encounter (HOSPITAL_COMMUNITY): Payer: Self-pay

## 2019-12-07 ENCOUNTER — Other Ambulatory Visit: Payer: Self-pay

## 2019-12-07 DIAGNOSIS — B9789 Other viral agents as the cause of diseases classified elsewhere: Secondary | ICD-10-CM

## 2019-12-07 DIAGNOSIS — Z20822 Contact with and (suspected) exposure to covid-19: Secondary | ICD-10-CM

## 2019-12-07 DIAGNOSIS — U071 COVID-19: Secondary | ICD-10-CM | POA: Insufficient documentation

## 2019-12-07 DIAGNOSIS — R05 Cough: Secondary | ICD-10-CM

## 2019-12-07 DIAGNOSIS — J069 Acute upper respiratory infection, unspecified: Secondary | ICD-10-CM | POA: Diagnosis not present

## 2019-12-07 HISTORY — DX: Bronchitis, not specified as acute or chronic: J40

## 2019-12-07 MED ORDER — ONDANSETRON 4 MG PO TBDP
4.0000 mg | ORAL_TABLET | Freq: Once | ORAL | Status: AC
  Administered 2019-12-07: 16:00:00 4 mg via ORAL

## 2019-12-07 MED ORDER — BENZONATATE 200 MG PO CAPS: 200.0000 mg | ORAL_CAPSULE | Freq: Two times a day (BID) | ORAL | 0 refills | Status: DC | PRN

## 2019-12-07 MED ORDER — ONDANSETRON 4 MG PO TBDP
ORAL_TABLET | ORAL | Status: AC
  Filled 2019-12-07: qty 1

## 2019-12-07 MED ORDER — ONDANSETRON HCL 4 MG PO TABS: 4.0000 mg | ORAL_TABLET | Freq: Four times a day (QID) | ORAL | 0 refills | Status: DC

## 2019-12-07 MED FILL — ONDANSETRON HCL 4 MG TABLET: 4 | 3 days supply | Qty: 12 | Fill #0

## 2019-12-07 MED FILL — MONO-LINYAH 28 TABLET: 0.25-35 | 28 days supply | Qty: 28 | Fill #1

## 2019-12-07 NOTE — ED Provider Notes (Signed)
[place]    CSN: [id number] Arrival date & time: 12/07/19  1444      History   Chief Complaint Chief Complaint  Patient presents with  . Cough    HPI Barbara Dunn is a 49 y.o. female.   HPI   Patient is here with "bronchitis".  She has nasal congestion, runny stuffy nose, postnasal drip, cough for the last several days.  She states now she has headache and nausea.  Some dizziness.  No body aches.  She feels very tired.  No sore throat.  No change in appetite, taste or smell. No no known exposure to Covid Patient works as a Quarry manager No one else at home is sick  Past Medical History:  Diagnosis Date  . Anemia   . Arthritis   . Bronchitis   . Hypertension   . Migraines   . Obesity     Patient Active Problem List   Diagnosis Date Noted  . Seasonal allergies 01/23/2016  . Oral contraceptive use 10/19/2015  . Pain in the chest   . Palpitations   . Abnormal nuclear stress test   . Dizziness and giddiness 05/30/2015  . Chest pain 05/30/2015  . Knee contusion 05/04/2015  . Vaginal discharge 02/12/2015  . Abnormal uterine bleeding 02/11/2015  . 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 Shortness of breath 04/20/2014  . Knee pain 04/20/2014  . 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 Candidal intertrigo 11/09/2013  . Health care maintenance 11/09/2013  . Hair loss 09/15/2013  . Pain in right shoulder 08/18/2013  . 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 Hypertension 07/13/2012  . Leg pain 03/27/2012  . Migraine without aura 01/17/2007  . OBESITY, NOS 12/19/2006    Past Surgical History:  Procedure Laterality Date  . CARDIAC CATHETERIZATION N/A 08/02/2015   Procedure: Left Heart Cath and Coronary Angiography;  Surgeon: Troy Sine, MD;  Location: [facility];  Service: Cardiovascular;  Laterality: N/A;  . CERVIX LESION DESTRUCTION    . TUBAL LIGATION    . TUBAL LIGATION      OB History   No obstetric history on file.      Home Medications     Prior to Admission medications   Medication Sig Start Date End Date Taking? Authorizing Provider  amLODipine (NORVASC) 5 MG tablet Take 1 tablet (5 mg total) by mouth daily. 05/18/19  Yes Charlott Rakes, MD  aspirin 81 MG tablet Take 81 mg by mouth daily.    Yes [provider]  cetirizine (ZYRTEC) 10 MG tablet Take 10 mg by mouth daily.   Yes [provider]  fluticasone (FLONASE) 50 MCG/ACT nasal spray PLACE 2 SPRAYS INTO BOTH NOSTRILS DAILY. 06/17/19  Yes Newlin, Charlane Ferretti, MD  fluticasone (FLOVENT HFA) 110 MCG/ACT inhaler Inhale 1 puff into the lungs 2 (two) times daily. 03/31/19  Yes Charlott Rakes, MD  lisinopril (ZESTRIL) 20 MG tablet Take 1 tablet (20 mg total) by mouth daily. 05/18/19  Yes Charlott Rakes, MD  meclizine (ANTIVERT) 25 MG tablet Take 1 tablet (25 mg total) by mouth 3 (three) times daily as needed for dizziness. 07/10/18  Yes Charlott Rakes, MD  benzonatate (TESSALON) 200 MG capsule Take 1 capsule (200 mg total) by mouth 2 (two) times daily as needed for cough. 12/07/19   Raylene Everts, MD  BIOTIN PO Take 2 tablets by mouth daily.     [provider]  chlorhexidine (PERIDEX) 0.12 % solution Use as directed 5 mLs in the mouth  or throat as needed.     [provider]  docusate sodium (COLACE) 100 MG capsule Take 100 mg by mouth as needed.     [provider]  MONO-LINYAH 0.25-35 MG-MCG tablet TAKE 1 TABLET BY MOUTH DAILY. 11/09/19   Charlott Rakes, MD  Multiple Vitamins-Minerals (MULTIVITAMIN ADULT PO) Take 1 tablet by mouth daily.     [provider]  ondansetron (ZOFRAN) 4 MG tablet Take 1 tablet (4 mg total) by mouth every 6 (six) hours. 12/07/19   Raylene Everts, MD  SUMAtriptan (IMITREX) 50 MG tablet Take 2 tablets (100 mg total) by mouth once for 1 dose. May repeat in 2 hours if headache persists, max 200mg /day 05/18/19 05/18/19  Charlott Rakes, MD  topiramate (TOPAMAX) 50 MG tablet Take 1 tablet (50 mg total) by  mouth 2 (two) times daily. 05/18/19   Charlott Rakes, MD  VENTOLIN HFA 108 (90 Base) MCG/ACT inhaler INHALE 1 PUFF INTO THE LUNGS EVERY 6 HOURS AS NEEDED FOR WHEEZING OR SHORTNESS OF BREATH. 03/09/19   Charlott Rakes, MD  diphenhydrAMINE (BENADRYL) 25 mg capsule Take 25 mg by mouth every 6 (six) hours as needed for itching. Reported on 01/23/2016  12/07/19  [provider]    Family History Family History  Problem Relation Age of Onset  . Hypertension Mother   . Schizophrenia Mother   . Schizophrenia Sister   . Schizophrenia Brother   . Schizophrenia Brother     Social History Social History   Tobacco Use  . Smoking status: Former Smoker    Quit date: 12/15/2003    Years since quitting: 15.9  . Smokeless tobacco: Never Used  . Tobacco comment: quit 10 years ago  Substance Use Topics  . Alcohol use: Yes    Alcohol/week: 2.0 standard drinks    Types: 1 Glasses of wine, 1 Shots of liquor per week    Comment: Once every 3 months   . Drug use: No     Allergies   Patient has no known allergies.   Review of Systems Review of Systems  Constitutional: Positive for fatigue. Negative for appetite change, chills, diaphoresis and fever.  HENT: Positive for congestion, postnasal drip and rhinorrhea. Negative for sore throat.   Respiratory: Positive for cough and shortness of breath.   Cardiovascular: Negative for chest pain.  Gastrointestinal: Positive for nausea. Negative for vomiting.  Musculoskeletal: Negative for myalgias.     Physical Exam Triage Vital Signs ED Triage Vitals  Enc Vitals Group     BP 12/07/19 1551 119/85     Pulse Rate 12/07/19 1551 79     Resp 12/07/19 1551 18     Temp 12/07/19 1551 98.8 F (37.1 C)     Temp Source 12/07/19 1551 Oral     SpO2 12/07/19 1551 100 %     Weight --      Height --      Head Circumference --      Peak Flow --      Pain Score 12/07/19 1547 0     Pain Loc --      Pain Edu? --      Excl. in [place]? --    No data  found.  Updated Vital Signs BP 119/85 (BP Location: Left Arm)   Pulse 79   Temp 98.8 F (37.1 C) (Oral)   Resp 18   LMP 11/16/2019 (Approximate) Comment: bcp  SpO2 100%      Physical Exam Constitutional:  General: She is not in acute distress.    Appearance: She is well-developed.     Comments: Overweight  HENT:     Head: Normocephalic and atraumatic.     Right Ear: Tympanic membrane and ear canal normal.     Left Ear: Tympanic membrane and ear canal normal.     Nose: Congestion and rhinorrhea present.     Mouth/Throat:     Mouth: Mucous membranes are moist.     Pharynx: No posterior oropharyngeal erythema.  Eyes:     Conjunctiva/sclera: Conjunctivae normal.     Pupils: Pupils are equal, round, and reactive to light.  Cardiovascular:     Rate and Rhythm: Normal rate and regular rhythm.     Heart sounds: Normal heart sounds.  Pulmonary:     Effort: Pulmonary effort is normal. No respiratory distress.     Breath sounds: Normal breath sounds.     Comments: Lungs are clear Musculoskeletal:        General: Normal range of motion.     Cervical back: Normal range of motion.  Lymphadenopathy:     Cervical: No cervical adenopathy.  Skin:    General: Skin is warm and dry.  Neurological:     General: No focal deficit present.     Mental Status: She is alert.  Psychiatric:        Mood and Affect: Mood normal.        Behavior: Behavior normal.      UC Treatments / Results  Labs (all labs ordered are listed, but only abnormal results are displayed) Labs Reviewed  NOVEL CORONAVIRUS, NAA (HOSP ORDER, SEND-OUT TO REF LAB; TAT 18-24 HRS)    EKG   Radiology No results found.  Procedures Procedures (including critical care time)  Medications Ordered in UC Medications  ondansetron (ZOFRAN-ODT) disintegrating tablet 4 mg (4 mg Oral Given 12/07/19 1557)    Initial Impression / Assessment and Plan / UC Course  I have reviewed the triage vital signs and the nursing  notes.  Pertinent labs & imaging results that were available during my care of the patient were reviewed by me and considered in my medical decision making (see chart for details).     Patient is tested for Covid.  We will going to treat her for viral bronchitis. Final Clinical Impressions(s) / UC Diagnoses   Final diagnoses:  Viral URI with cough  Suspected COVID-19 virus infection     Discharge Instructions     Go home to rest Drink plenty of fluids Take Tylenol for pain or fever Take the tessalon for cough Take the zofran as needed nausea You must quarantine at home until your test result is available You can check for your test result in MyChart    ED Prescriptions    Medication Sig Dispense Auth. Provider   ondansetron (ZOFRAN) 4 MG tablet Take 1 tablet (4 mg total) by mouth every 6 (six) hours. 12 tablet Raylene Everts, MD   benzonatate (TESSALON) 200 MG capsule Take 1 capsule (200 mg total) by mouth 2 (two) times daily as needed for cough. 20 capsule Raylene Everts, MD     PDMP not reviewed this encounter.   Raylene Everts, MD 12/07/19 2008

## 2019-12-07 NOTE — ED Triage Notes (Signed)
Pt states cough onset on Thursday, has h/o bronchitis. Then onset of nasal congestion, runny nose, HA, nausea, dizziness and fatigue. Denies abd pain, v/d, sore throat.

## 2019-12-07 NOTE — Discharge Instructions (Signed)
Go home to rest Drink plenty of fluids Take Tylenol for pain or fever Take the tessalon for cough Take the zofran as needed nausea You must quarantine at home until your test result is available You can check for your test result in MyChart

## 2019-12-09 LAB — NOVEL CORONAVIRUS, NAA (HOSP ORDER, SEND-OUT TO REF LAB; TAT 18-24 HRS): SARS-CoV-2, NAA: DETECTED — AB

## 2019-12-10 ENCOUNTER — Telehealth: Payer: Self-pay | Admitting: Unknown Physician Specialty

## 2019-12-10 ENCOUNTER — Other Ambulatory Visit: Payer: Self-pay | Admitting: Unknown Physician Specialty

## 2019-12-10 DIAGNOSIS — U071 COVID-19: Secondary | ICD-10-CM

## 2019-12-10 DIAGNOSIS — I10 Essential (primary) hypertension: Secondary | ICD-10-CM

## 2019-12-10 DIAGNOSIS — E6609 Other obesity due to excess calories: Secondary | ICD-10-CM

## 2019-12-10 DIAGNOSIS — Z6839 Body mass index (BMI) 39.0-39.9, adult: Secondary | ICD-10-CM

## 2019-12-10 NOTE — Telephone Encounter (Signed)
  I connected by phone with [name] on 12/10/2019 at 8:23 AM to discuss the potential use of an new treatment for mild to moderate COVID-19 viral infection in non-hospitalized patients.  This patient is a 49 y.o. female that meets the FDA criteria for Emergency Use Authorization of bamlanivimab or casirivimab\imdevimab.  Has a (+) direct SARS-CoV-2 viral test result  Has mild or moderate COVID-19   Is ? 47 years of age and weighs ? 40 kg  Is NOT hospitalized due to COVID-19  Is NOT requiring oxygen therapy or requiring an increase in baseline oxygen flow rate due to COVID-19  Is within 10 days of symptom onset  Has at least one of the high risk factor(s) for progression to severe COVID-19 and/or hospitalization as defined in EUA.  Specific high risk criteria : BMI >/= 35   I have spoken and communicated the following to the patient or parent/caregiver:  1. FDA has authorized the emergency use of bamlanivimab and casirivimab\imdevimab for the treatment of mild to moderate COVID-19 in adults and pediatric patients with positive results of direct SARS-CoV-2 viral testing who are 15 years of age and older weighing at least 40 kg, and who are at high risk for progressing to severe COVID-19 and/or hospitalization.  2. The significant known and potential risks and benefits of bamlanivimab and casirivimab\imdevimab, and the extent to which such potential risks and benefits are unknown.  3. Information on available alternative treatments and the risks and benefits of those alternatives, including clinical trials.  4. Patients treated with bamlanivimab and casirivimab\imdevimab should continue to self-isolate and use infection control measures (e.g., wear mask, isolate, social distance, avoid sharing personal items, clean and disinfect "high touch" surfaces, and frequent handwashing) according to CDC guidelines.   5. The patient or parent/caregiver has the option to accept or refuse  bamlanivimab or casirivimab\imdevimab .  After reviewing this information with the patient, The patient agreed to proceed with receiving the bamlanimivab infusion and will be provided a copy of the Fact sheet prior to receiving the infusion.Kathrine Haddock 12/10/2019 8:23 AM  Sx onset 2/11

## 2019-12-11 ENCOUNTER — Encounter (HOSPITAL_COMMUNITY): Payer: Self-pay

## 2019-12-11 ENCOUNTER — Ambulatory Visit (HOSPITAL_COMMUNITY)
Admission: RE | Admit: 2019-12-11 | Discharge: 2019-12-11 | Disposition: A | Discharge status: Home / Self Care | Payer: BC Managed Care – PPO | Source: Ambulatory Visit | Attending: Pulmonary Disease | Admitting: Pulmonary Disease

## 2019-12-11 DIAGNOSIS — E6609 Other obesity due to excess calories: Secondary | ICD-10-CM | POA: Insufficient documentation

## 2019-12-11 DIAGNOSIS — Z6839 Body mass index (BMI) 39.0-39.9, adult: Secondary | ICD-10-CM | POA: Insufficient documentation

## 2019-12-11 DIAGNOSIS — U071 COVID-19: Secondary | ICD-10-CM | POA: Diagnosis not present

## 2019-12-11 DIAGNOSIS — I10 Essential (primary) hypertension: Secondary | ICD-10-CM | POA: Diagnosis not present

## 2019-12-11 MED ORDER — SODIUM CHLORIDE 0.9 % IV SOLN
700.0000 mg | Freq: Once | INTRAVENOUS | Status: AC
  Administered 2019-12-11: 700 mg via INTRAVENOUS
  Filled 2019-12-11: qty 20

## 2019-12-11 MED ORDER — EPINEPHRINE 0.3 MG/0.3ML IJ SOAJ: 0.3000 mg | Freq: Once | INTRAMUSCULAR | Status: DC | PRN

## 2019-12-11 MED ORDER — METHYLPREDNISOLONE SODIUM SUCC 125 MG IJ SOLR: 125.0000 mg | Freq: Once | INTRAMUSCULAR | Status: DC | PRN

## 2019-12-11 MED ORDER — DIPHENHYDRAMINE HCL 50 MG/ML IJ SOLN: 50.0000 mg | Freq: Once | INTRAMUSCULAR | Status: DC | PRN

## 2019-12-11 MED ORDER — SODIUM CHLORIDE 0.9 % IV SOLN
INTRAVENOUS | Status: DC | PRN
  Administered 2019-12-11: 10:00:00 250 mL via INTRAVENOUS

## 2019-12-11 MED ORDER — FAMOTIDINE IN NACL 20-0.9 MG/50ML-% IV SOLN: 20.0000 mg | Freq: Once | INTRAVENOUS | Status: DC | PRN

## 2019-12-11 MED ORDER — ALBUTEROL SULFATE HFA 108 (90 BASE) MCG/ACT IN AERS: 2.0000 | INHALATION_SPRAY | Freq: Once | RESPIRATORY_TRACT | Status: DC | PRN

## 2019-12-11 NOTE — Progress Notes (Signed)
  Diagnosis: COVID-19  Physician: Dr Joya Gaskins  Procedure: [facility] Med: bamlanivimab infusion - Provided patient with bamlanimivab fact sheet for patients, parents and caregivers prior to infusion.  Complications: No immediate complications noted.  Discharge: Discharged home   Barbara Dunn 12/11/2019

## 2019-12-11 NOTE — Discharge Instructions (Signed)

## 2019-12-14 MED FILL — LISINOPRIL 20 MG TABLET: 20 | 30 days supply | Qty: 30 | Fill #0

## 2019-12-14 MED FILL — AMLODIPINE BESYLATE 5 MG TA: 5 | 30 days supply | Qty: 30 | Fill #0

## 2019-12-15 ENCOUNTER — Telehealth (HOSPITAL_COMMUNITY): Payer: Self-pay | Admitting: Emergency Medicine

## 2019-12-15 ENCOUNTER — Ambulatory Visit (HOSPITAL_COMMUNITY)
Admission: EM | Admit: 2019-12-15 | Discharge: 2019-12-15 | Disposition: A | Discharge status: Home / Self Care | Payer: BC Managed Care – PPO | Attending: Family Medicine | Admitting: Family Medicine

## 2019-12-15 ENCOUNTER — Other Ambulatory Visit: Payer: Self-pay

## 2019-12-15 ENCOUNTER — Encounter (HOSPITAL_COMMUNITY): Payer: Self-pay

## 2019-12-15 ENCOUNTER — Ambulatory Visit (INDEPENDENT_AMBULATORY_CARE_PROVIDER_SITE_OTHER): Payer: BC Managed Care – PPO

## 2019-12-15 DIAGNOSIS — U071 COVID-19: Secondary | ICD-10-CM

## 2019-12-15 MED ORDER — HYDROCODONE-HOMATROPINE 5-1.5 MG/5ML PO SYRP: 5.0000 mL | ORAL_SOLUTION | Freq: Four times a day (QID) | ORAL | 0 refills | Status: DC | PRN

## 2019-12-15 NOTE — Telephone Encounter (Signed)
Patient requested script be sent to another pharmacy : [facility].  Notified dr hagler to send to this pharmacy.  This nurse called [facility] and notified them to cancel hycodan script.

## 2019-12-15 NOTE — Telephone Encounter (Signed)
Patient requested medication be switched to [facility] [address].  Medication is controlled, will notify ordering provider

## 2019-12-15 NOTE — Discharge Instructions (Addendum)
Be aware, your cough medication may cause drowsiness. Please do not drive, operate heavy machinery or make important decisions while on this medication, it can cloud your judgement.  

## 2019-12-15 NOTE — ED Triage Notes (Addendum)
Pt reports she tested positive for COVID on 12/08/2019. Pt reports in the past 3 days the headaches, cough and SOB are worse. Pt reports she had an infusion on 12/11/2019 and the infusion did not help her at all.

## 2019-12-16 NOTE — ED Provider Notes (Signed)
[place]   [id number] 12/15/19 Arrival Time: [id number]  ASSESSMENT & PLAN:  1. COVID-19 virus infection      COVID-19 testing sent. See letter/work note on file for self-isolation guidelines.  I have personally viewed the imaging studies ordered this visit. No signs of PNA.  Meds ordered this encounter  Medications  . HYDROcodone-homatropine (HYCODAN) 5-1.5 MG/5ML syrup    Sig: Take 5 mLs by mouth every 6 (six) hours as needed for cough.    Dispense:  120 mL    Refill:  0    Follow-up Information    [place].   Specialty: Urgent Care Why: If worsening or failing to improve as anticipated. Contact information: [place] [phone]          Reviewed expectations re: course of current medical issues. Questions answered. Outlined signs and symptoms indicating need for more acute intervention. Understanding verbalized. After Visit Summary given.   SUBJECTIVE: History from: patient. Barbara Dunn is a 49 y.o. female who reports testing positive for COVID on 12/07/2019. Now feeling worse. Mild SOB at times. Persistent coughing. Overall fatigue and aches. Denies: chest pain. Normal PO intake without n/v/d.   OBJECTIVE:  Vitals:   12/15/19 1636  BP: 118/68  Pulse: 87  Resp: (!) 25  Temp: 99 F (37.2 C)  TempSrc: Oral  SpO2: 100%    Recheck RR: 20  General appearance: alert; no distress Eyes: PERRLA; EOMI; conjunctiva normal HENT: [place]; AT; nasal mucosa normal; oral mucosa normal Neck: supple  Lungs: speaks full sentences without difficulty; unlabored Extremities: no edema Skin: warm and dry Neurologic: normal gait Psychological: alert and cooperative; normal mood and affect  Labs Reviewed: Results for orders placed or performed during the hospital encounter of 12/07/19  Novel Coronavirus, NAA (Hosp order, Send-out to Ref Lab; TAT 18-24 hrs   Specimen: Nasopharyngeal  Swab; Respiratory  Result Value Ref Range   SARS-CoV-2, NAA DETECTED (A) NOT DETECTED   Coronavirus Source NASOPHARYNGEAL    Labs Reviewed - No data to display  Imaging: DG Chest 2 View  Result Date: 12/15/2019 CLINICAL DATA:  49 year old female with shortness of breath. EXAM: CHEST - 2 VIEW COMPARISON:  Chest radiograph dated 09/25/2016 FINDINGS: No focal consolidation, pleural effusion, or pneumothorax. A 7 mm apparent ground-glass nodular density in the right upper lung field superimposed over the anterior right fourth rib may be artifactual or represent a focal area of sclerosis. A pulmonary nodule is not excluded. This appears new since the prior radiograph. If the patient has risk factors for pulmonary disease or lung cancer further evaluation with CT is recommended. Otherwise follow-up with radiograph in 3 months is recommended. The cardiac silhouette is within normal limits. No acute osseous pathology. IMPRESSION: 1. No acute cardiopulmonary process. 2. Faint 7 mm nodular density in the right upper lobe as above. Follow-up recommended. Electronically Signed   By: Anner Crete M.D.   On: 12/15/2019 17:30    No Known Allergies  Past Medical History:  Diagnosis Date  . Anemia   . Arthritis   . Bronchitis   . Hypertension   . Migraines   . Obesity    Social History   Socioeconomic History  . Marital status: Single    Spouse name: Not on file  . Number of children: Not on file  . Years of education: Not on file  . Highest education level: Not on file  Occupational History  .  Not on file  Tobacco Use  . Smoking status: Former Smoker    Quit date: 12/15/2003    Years since quitting: 16.0  . Smokeless tobacco: Never Used  . Tobacco comment: quit 10 years ago  Substance and Sexual Activity  . Alcohol use: Yes    Alcohol/week: 2.0 standard drinks    Types: 1 Glasses of wine, 1 Shots of liquor per week    Comment: Once every 3 months   . Drug use: No  . Sexual activity:  Yes    Birth control/protection: Other-see comments    Comment: tubal  Other Topics Concern  . Not on file  Social History Narrative   Pt has GED. CNA as well.    Social Determinants of Health   Financial Resource Strain:   . Difficulty of Paying Living Expenses: Not on file  Food Insecurity:   . Worried About Charity fundraiser in the Last Year: Not on file  . Ran Out of Food in the Last Year: Not on file  Transportation Needs:   . Lack of Transportation (Medical): Not on file  . Lack of Transportation (Non-Medical): Not on file  Physical Activity:   . Days of Exercise per Week: Not on file  . Minutes of Exercise per Session: Not on file  Stress:   . Feeling of Stress : Not on file  Social Connections:   . Frequency of Communication with Friends and Family: Not on file  . Frequency of Social Gatherings with Friends and Family: Not on file  . Attends Religious Services: Not on file  . Active Member of Clubs or Organizations: Not on file  . Attends Archivist Meetings: Not on file  . Marital Status: Not on file  Intimate Partner Violence:   . Fear of Current or Ex-Partner: Not on file  . Emotionally Abused: Not on file  . Physically Abused: Not on file  . Sexually Abused: Not on file   Family History  Problem Relation Age of Onset  . Hypertension Mother   . Schizophrenia Mother   . Schizophrenia Sister   . Schizophrenia Brother   . Schizophrenia Brother    Past Surgical History:  Procedure Laterality Date  . CARDIAC CATHETERIZATION N/A 08/02/2015   Procedure: Left Heart Cath and Coronary Angiography;  Surgeon: Troy Sine, MD;  Location: [facility];  Service: Cardiovascular;  Laterality: N/A;  . CERVIX LESION DESTRUCTION    . TUBAL LIGATION    . TUBAL LIGATION       Vanessa Kick, MD 12/16/19 [phone]

## 2019-12-21 ENCOUNTER — Telehealth (HOSPITAL_COMMUNITY): Payer: Self-pay | Admitting: Emergency Medicine

## 2019-12-21 NOTE — Telephone Encounter (Signed)
Pt contacted regarding xray results. Will follow up in 3 months for repeat xray.

## 2019-12-22 ENCOUNTER — Encounter: Payer: Self-pay | Admitting: Family Medicine

## 2019-12-24 ENCOUNTER — Other Ambulatory Visit: Payer: Self-pay

## 2019-12-24 ENCOUNTER — Ambulatory Visit: Payer: BC Managed Care – PPO | Attending: Family Medicine | Admitting: Family Medicine

## 2019-12-24 ENCOUNTER — Encounter: Payer: Self-pay | Admitting: Family Medicine

## 2019-12-24 DIAGNOSIS — R918 Other nonspecific abnormal finding of lung field: Secondary | ICD-10-CM | POA: Diagnosis not present

## 2019-12-24 NOTE — Progress Notes (Signed)
Virtual Visit via Telephone Note  I connected with Barbara Dunn, on 12/24/2019 at 10:45 AM by telephone due to the COVID-19 pandemic and verified that I am speaking with the correct person using two identifiers.   Consent: I discussed the limitations, risks, security and privacy concerns of performing an evaluation and management service by telephone and the availability of in person appointments. I also discussed with the patient that there may be a patient responsible charge related to this service. The patient expressed understanding and agreed to proceed.   Location of Patient: At work  Location of Provider: Clinic   Persons participating in Telemedicine visit: Lennan Luchana Tudor Farrington-CMA Dr. Margarita Rana     History of Present Illness: Barbara Dunn presents is a 49 year-old female with a history of hypertension, migraine, obesity seen fora follow up of chronic medical conditions She is concerned about abnormal chest x-ray findings incidentally noted at an urgent care visit and would like to discuss this. She is a previous smoker - quit 10 years ago.  Chest x-ray from 12/15/2019 revealed: FINDINGS: No focal consolidation, pleural effusion, or pneumothorax. A 7 mm apparent ground-glass nodular density in the right upper lung field superimposed over the anterior right fourth rib may be artifactual or represent a focal area of sclerosis. A pulmonary nodule is not excluded. This appears new since the prior radiograph. If the patient has risk factors for pulmonary disease or lung cancer further evaluation with CT is recommended. Otherwise follow-up with radiograph in 3 months is recommended.  The cardiac silhouette is within normal limits. No acute osseous pathology.  IMPRESSION: 1. No acute cardiopulmonary process. 2. Faint 7 mm nodular density in the right upper lobe as above. Follow-up recommended.   Past Medical History:  Diagnosis Date  . Anemia   . Arthritis   .  Bronchitis   . Hypertension   . Migraines   . Obesity    No Known Allergies  Current Outpatient Medications on File Prior to Visit  Medication Sig Dispense Refill  . amLODipine (NORVASC) 5 MG tablet Take 1 tablet (5 mg total) by mouth daily. 30 tablet 6  . aspirin 81 MG tablet Take 81 mg by mouth daily.     . benzonatate (TESSALON) 200 MG capsule Take 1 capsule (200 mg total) by mouth 2 (two) times daily as needed for cough. 20 capsule 0  . BIOTIN PO Take 2 tablets by mouth daily.     . cetirizine (ZYRTEC) 10 MG tablet Take 10 mg by mouth daily.    . chlorhexidine (PERIDEX) 0.12 % solution Use as directed 5 mLs in the mouth or throat as needed.     . docusate sodium (COLACE) 100 MG capsule Take 100 mg by mouth as needed.     . fluticasone (FLONASE) 50 MCG/ACT nasal spray PLACE 2 SPRAYS INTO BOTH NOSTRILS DAILY. 16 g 2  . fluticasone (FLOVENT HFA) 110 MCG/ACT inhaler Inhale 1 puff into the lungs 2 (two) times daily. 1 Inhaler 2  . HYDROcodone-homatropine (HYCODAN) 5-1.5 MG/5ML syrup Take 5 mLs by mouth every 6 (six) hours as needed for cough. 120 mL 0  . lisinopril (ZESTRIL) 20 MG tablet Take 1 tablet (20 mg total) by mouth daily. 30 tablet 6  . meclizine (ANTIVERT) 25 MG tablet Take 1 tablet (25 mg total) by mouth 3 (three) times daily as needed for dizziness. 60 tablet 1  . MONO-LINYAH 0.25-35 MG-MCG tablet TAKE 1 TABLET BY MOUTH DAILY. 28 tablet 2  . Multiple  Vitamins-Minerals (MULTIVITAMIN ADULT PO) Take 1 tablet by mouth daily.     . ondansetron (ZOFRAN) 4 MG tablet Take 1 tablet (4 mg total) by mouth every 6 (six) hours. 12 tablet 0  . SUMAtriptan (IMITREX) 50 MG tablet Take 2 tablets (100 mg total) by mouth once for 1 dose. May repeat in 2 hours if headache persists, max 200mg /day 10 tablet 6  . topiramate (TOPAMAX) 50 MG tablet Take 1 tablet (50 mg total) by mouth 2 (two) times daily. 60 tablet 6  . VENTOLIN HFA 108 (90 Base) MCG/ACT inhaler INHALE 1 PUFF INTO THE LUNGS EVERY 6 HOURS  AS NEEDED FOR WHEEZING OR SHORTNESS OF BREATH. 18 g 1  . [DISCONTINUED] diphenhydrAMINE (BENADRYL) 25 mg capsule Take 25 mg by mouth every 6 (six) hours as needed for itching. Reported on 01/23/2016     No current facility-administered medications on file prior to visit.    Observations/Objective: Awake, alert, oriented x3 Not in acute distress  Assessment and Plan: 1. Abnormal findings on diagnostic imaging of lung 7 mm lesion with a smaller size is more reassuring and could be artifact versus pulmonary nodule Given previous history of smoking will refer for CT chest - CT Chest W Contrast; Future   Follow Up Instructions: Keep previously scheduled appointment   I discussed the assessment and treatment plan with the patient. The patient was provided an opportunity to ask questions and all were answered. The patient agreed with the plan and demonstrated an understanding of the instructions.   The patient was advised to call back or seek an in-person evaluation if the symptoms worsen or if the condition fails to improve as anticipated.     I provided 11 minutes total of non-face-to-face time during this encounter including median intraservice time, reviewing previous notes, investigations, ordering medications, medical decision making, coordinating care and patient verbalized understanding at the end of the visit.     Charlott Rakes, MD, FAAFP. [facility] and [facility] [place], [place]   12/24/2019, 10:45 AM

## 2019-12-24 NOTE — Progress Notes (Signed)
Patient has been called and DOB has been verified. Patient has been screened and transferred to PCP to start phone visit.    Wants to discuss recent Xray results.

## 2019-12-28 MED FILL — FLOVENT HFA 110 MCG INHALER: 110 | 30 days supply | Qty: 12 | Fill #1

## 2019-12-30 ENCOUNTER — Other Ambulatory Visit: Payer: Self-pay

## 2019-12-30 ENCOUNTER — Ambulatory Visit: Payer: BC Managed Care – PPO | Attending: Family Medicine

## 2019-12-30 DIAGNOSIS — I10 Essential (primary) hypertension: Secondary | ICD-10-CM

## 2019-12-31 LAB — CMP14+EGFR
ALT: 15 IU/L (ref 0–32)
AST: 18 IU/L (ref 0–40)
Albumin/Globulin Ratio: 1.5 (ref 1.2–2.2)
Albumin: 4.1 g/dL (ref 3.8–4.8)
Alkaline Phosphatase: 50 IU/L (ref 39–117)
BUN/Creatinine Ratio: 9 (ref 9–23)
BUN: 9 mg/dL (ref 6–24)
Bilirubin Total: 0.3 mg/dL (ref 0.0–1.2)
CO2: 20 mmol/L (ref 20–29)
Calcium: 9 mg/dL (ref 8.7–10.2)
Chloride: 109 mmol/L — ABNORMAL HIGH (ref 96–106)
Creatinine, Ser: 1 mg/dL (ref 0.57–1.00)
GFR calc Af Amer: 78 mL/min/{1.73_m2} (ref >59)
GFR calc non Af Amer: 67 mL/min/{1.73_m2} (ref >59)
Globulin, Total: 2.7 g/dL (ref 1.5–4.5)
Glucose: 82 mg/dL (ref 65–99)
Potassium: 4.2 mmol/L (ref 3.5–5.2)
Sodium: 142 mmol/L (ref 134–144)
Total Protein: 6.8 g/dL (ref 6.0–8.5)

## 2020-01-04 MED FILL — MONO-LINYAH 28 TABLET: 0.25-35 | 28 days supply | Qty: 28 | Fill #2

## 2020-01-11 ENCOUNTER — Other Ambulatory Visit: Payer: Self-pay | Admitting: Family Medicine

## 2020-01-11 DIAGNOSIS — J302 Other seasonal allergic rhinitis: Secondary | ICD-10-CM

## 2020-01-11 MED FILL — AMLODIPINE BESYLATE 5 MG TA: 5 | 30 days supply | Qty: 30 | Fill #1

## 2020-01-11 MED FILL — SUMATRIPTAN SUCC 50 MG TAB: 50 | 30 days supply | Qty: 9 | Fill #2

## 2020-01-11 MED FILL — LISINOPRIL 20 MG TABLET: 20 | 30 days supply | Qty: 30 | Fill #1

## 2020-01-11 MED FILL — FLUTICASONE PROP 50 MCG SPR: 50 | 30 days supply | Qty: 16 | Fill #0

## 2020-01-11 MED FILL — TOPIRAMATE 50 MG TABLET: 50 | 30 days supply | Qty: 60 | Fill #2

## 2020-01-13 ENCOUNTER — Ambulatory Visit (HOSPITAL_COMMUNITY)
Admission: RE | Admit: 2020-01-13 | Discharge: 2020-01-13 | Disposition: A | Discharge status: Home / Self Care | Payer: BC Managed Care – PPO | Source: Ambulatory Visit | Attending: Family Medicine | Admitting: Family Medicine

## 2020-01-13 ENCOUNTER — Other Ambulatory Visit: Payer: Self-pay

## 2020-01-13 DIAGNOSIS — R918 Other nonspecific abnormal finding of lung field: Secondary | ICD-10-CM | POA: Insufficient documentation

## 2020-01-13 DIAGNOSIS — R911 Solitary pulmonary nodule: Secondary | ICD-10-CM | POA: Diagnosis not present

## 2020-01-13 MED ORDER — IOHEXOL 300 MG/ML  SOLN
100.0000 mL | Freq: Once | INTRAMUSCULAR | Status: AC | PRN
  Administered 2020-01-13: 100 mL via INTRAVENOUS

## 2020-02-01 ENCOUNTER — Other Ambulatory Visit: Payer: Self-pay | Admitting: Family Medicine

## 2020-02-01 ENCOUNTER — Other Ambulatory Visit: Payer: Self-pay

## 2020-02-01 ENCOUNTER — Encounter: Payer: Self-pay | Admitting: Family Medicine

## 2020-02-01 ENCOUNTER — Ambulatory Visit: Payer: BC Managed Care – PPO | Attending: Family Medicine | Admitting: Family Medicine

## 2020-02-01 DIAGNOSIS — I10 Essential (primary) hypertension: Secondary | ICD-10-CM

## 2020-02-01 DIAGNOSIS — J302 Other seasonal allergic rhinitis: Secondary | ICD-10-CM

## 2020-02-01 DIAGNOSIS — G43009 Migraine without aura, not intractable, without status migrainosus: Secondary | ICD-10-CM | POA: Diagnosis not present

## 2020-02-01 DIAGNOSIS — Z3041 Encounter for surveillance of contraceptive pills: Secondary | ICD-10-CM

## 2020-02-01 DIAGNOSIS — B373 Candidiasis of vulva and vagina: Secondary | ICD-10-CM

## 2020-02-01 DIAGNOSIS — R6889 Other general symptoms and signs: Secondary | ICD-10-CM | POA: Diagnosis not present

## 2020-02-01 DIAGNOSIS — B3731 Acute candidiasis of vulva and vagina: Secondary | ICD-10-CM

## 2020-02-01 MED ORDER — FLUCONAZOLE 150 MG PO TABS: 150.0000 mg | ORAL_TABLET | Freq: Once | ORAL | 0 refills | Status: AC

## 2020-02-01 MED ORDER — NORGESTIMATE-ETH ESTRADIOL 0.25-35 MG-MCG PO TABS: 1.0000 | ORAL_TABLET | Freq: Every day | ORAL | 2 refills | Status: DC

## 2020-02-01 MED ORDER — TOPIRAMATE 50 MG PO TABS: 50.0000 mg | ORAL_TABLET | Freq: Two times a day (BID) | ORAL | 6 refills | Status: DC

## 2020-02-01 MED ORDER — ALBUTEROL SULFATE HFA 108 (90 BASE) MCG/ACT IN AERS: INHALATION_SPRAY | RESPIRATORY_TRACT | 1 refills | Status: DC

## 2020-02-01 MED ORDER — AMLODIPINE BESYLATE 5 MG PO TABS: 5.0000 mg | ORAL_TABLET | Freq: Every day | ORAL | 6 refills | Status: DC

## 2020-02-01 MED ORDER — LISINOPRIL 20 MG PO TABS: 20.0000 mg | ORAL_TABLET | Freq: Every day | ORAL | 6 refills | Status: DC

## 2020-02-01 MED ORDER — FLOVENT HFA 110 MCG/ACT IN AERO: 1.0000 | INHALATION_SPRAY | Freq: Two times a day (BID) | RESPIRATORY_TRACT | 2 refills | Status: DC

## 2020-02-01 MED ORDER — SUMATRIPTAN SUCCINATE 50 MG PO TABS: 100.0000 mg | ORAL_TABLET | Freq: Once | ORAL | 6 refills | Status: DC

## 2020-02-01 MED FILL — FLUCONAZOLE 150 MG TABLET: 150 | 1 days supply | Qty: 1 | Fill #0

## 2020-02-01 MED FILL — FLOVENT HFA 110 MCG INHALER: 110 | 30 days supply | Qty: 12 | Fill #0

## 2020-02-01 MED FILL — MONO-LINYAH 28 TABLET: 0.25-35 | 28 days supply | Qty: 28 | Fill #0

## 2020-02-01 MED FILL — VENTOLIN HFA 90 MCG INHALER: 108 (90 BAS | 25 days supply | Qty: 18 | Fill #0

## 2020-02-01 NOTE — Progress Notes (Signed)
Virtual Visit via Video Note  I connected with Barbara Dunn, on 02/01/2020 at 9:33 AM by video enabled telemedicine device due to the COVID-19 pandemic and verified that I am speaking with the correct person using two identifiers.   Consent: I discussed the limitations, risks, security and privacy concerns of performing an evaluation and management service by telemedicine and the availability of in person appointments. I also discussed with the patient that there may be a patient responsible charge related to this service. The patient expressed understanding and agreed to proceed.   Location of Patient: Home  Location of Provider: Clinic   Persons participating in Telemedicine visit: Barbara Dunn-CMA Dr. Margarita Rana     History of Present Illness: Barbara Dunn is a 49year-old female with a history of hypertension, migraine, obesity seen fora follow up of chronic medical conditions   She used a new Dove soap and has had itching in her perineum and this looks like a yeast infection which she has had in the past. She complains of cold intolerance ever since she received the COVI-19 antibody infusion.  Thyroid panel from last fall was negative and her last hemoglobin was normal in 09/2019.  Her migraines are controlled and she is doing well on her antihypertensive. She is requesting a refill her birth control medication. Doing well on her antihypertensives.  Past Medical History:  Diagnosis Date  . Anemia   . Arthritis   . Bronchitis   . Hypertension   . Migraines   . Obesity    No Known Allergies  Current Outpatient Medications on File Prior to Visit  Medication Sig Dispense Refill  . amLODipine (NORVASC) 5 MG tablet Take 1 tablet (5 mg total) by mouth daily. 30 tablet 6  . aspirin 81 MG tablet Take 81 mg by mouth daily.     . benzonatate (TESSALON) 100 MG capsule TAKE 1 CAPSULE (100 MG TOTAL) BY MOUTH 3 (THREE) TIMES DAILY. 30 capsule 0  .  benzonatate (TESSALON) 200 MG capsule Take 1 capsule (200 mg total) by mouth 2 (two) times daily as needed for cough. 20 capsule 0  . BIOTIN PO Take 2 tablets by mouth daily.     . cetirizine (ZYRTEC) 10 MG tablet Take 10 mg by mouth daily.    . chlorhexidine (PERIDEX) 0.12 % solution Use as directed 5 mLs in the mouth or throat as needed.     . docusate sodium (COLACE) 100 MG capsule Take 100 mg by mouth as needed.     . fluticasone (FLONASE) 50 MCG/ACT nasal spray PLACE 2 SPRAYS INTO BOTH NOSTRILS DAILY. 16 g 2  . fluticasone (FLOVENT HFA) 110 MCG/ACT inhaler Inhale 1 puff into the lungs 2 (two) times daily. 1 Inhaler 2  . HYDROcodone-homatropine (HYCODAN) 5-1.5 MG/5ML syrup Take 5 mLs by mouth every 6 (six) hours as needed for cough. 120 mL 0  . lisinopril (ZESTRIL) 20 MG tablet Take 1 tablet (20 mg total) by mouth daily. 30 tablet 6  . meclizine (ANTIVERT) 25 MG tablet Take 1 tablet (25 mg total) by mouth 3 (three) times daily as needed for dizziness. 60 tablet 1  . MONO-LINYAH 0.25-35 MG-MCG tablet TAKE 1 TABLET BY MOUTH DAILY. 28 tablet 2  . Multiple Vitamins-Minerals (MULTIVITAMIN ADULT PO) Take 1 tablet by mouth daily.     . ondansetron (ZOFRAN) 4 MG tablet Take 1 tablet (4 mg total) by mouth every 6 (six) hours. (Patient not taking: Reported on 12/24/2019) 12 tablet 0  .  SUMAtriptan (IMITREX) 50 MG tablet Take 2 tablets (100 mg total) by mouth once for 1 dose. May repeat in 2 hours if headache persists, max 200mg /day 10 tablet 6  . topiramate (TOPAMAX) 50 MG tablet Take 1 tablet (50 mg total) by mouth 2 (two) times daily. 60 tablet 6  . VENTOLIN HFA 108 (90 Base) MCG/ACT inhaler INHALE 1 PUFF INTO THE LUNGS EVERY 6 HOURS AS NEEDED FOR WHEEZING OR SHORTNESS OF BREATH. 18 g 1  . [DISCONTINUED] diphenhydrAMINE (BENADRYL) 25 mg capsule Take 25 mg by mouth every 6 (six) hours as needed for itching. Reported on 01/23/2016     No current facility-administered medications on file prior to visit.     Observations/Objective: Awake, alert, oriented x3 There is no acute distress  Assessment and Plan: 1. Essential hypertension Stable Counseled on blood pressure goal of less than 130/80, low-sodium, DASH diet, medication compliance, 150 minutes of moderate intensity exercise per week. Discussed medication compliance, adverse effects. - amLODipine (NORVASC) 5 MG tablet; Take 1 tablet (5 mg total) by mouth daily.  Dispense: 30 tablet; Refill: 6 - lisinopril (ZESTRIL) 20 MG tablet; Take 1 tablet (20 mg total) by mouth daily.  Dispense: 30 tablet; Refill: 6  2. Oral contraceptive use - norgestimate-ethinyl estradiol (MONO-LINYAH) 0.25-35 MG-MCG tablet; Take 1 tablet by mouth daily.  Dispense: 28 tablet; Refill: 2  3. Migraine without aura and without status migrainosus, not intractable Controlled - topiramate (TOPAMAX) 50 MG tablet; Take 1 tablet (50 mg total) by mouth 2 (two) times daily.  Dispense: 60 tablet; Refill: 6 - SUMAtriptan (IMITREX) 50 MG tablet; Take 2 tablets (100 mg total) by mouth once for 1 dose. May repeat in 2 hours if headache persists, max 200mg /day  Dispense: 10 tablet; Refill: 6  4. Seasonal allergies Stable - fluticasone (FLOVENT HFA) 110 MCG/ACT inhaler; Inhale 1 puff into the lungs 2 (two) times daily.  Dispense: 1 Inhaler; Refill: 2 - albuterol (VENTOLIN HFA) 108 (90 Base) MCG/ACT inhaler; INHALE 1 PUFF INTO THE LUNGS EVERY 6 HOURS AS NEEDED FOR WHEEZING OR SHORTNESS OF BREATH.  Dispense: 18 g; Refill: 1  5. Cold intolerance We will evaluate for anemia and also for underlying thyroid disorder - CBC with Differential/Platelet - T4, free - TSH  6. Vaginal candidiasis Advised to use fragrance free products - fluconazole (DIFLUCAN) 150 MG tablet; Take 1 tablet (150 mg total) by mouth once for 1 dose.  Dispense: 1 tablet; Refill: 0   Follow Up Instructions: 3 months   I discussed the assessment and treatment plan with the patient. The patient was provided  an opportunity to ask questions and all were answered. The patient agreed with the plan and demonstrated an understanding of the instructions.   The patient was advised to call back or seek an in-person evaluation if the symptoms worsen or if the condition fails to improve as anticipated.     I provided 18 minutes total of Telehealth time during this encounter including median intraservice time, reviewing previous notes, investigations, ordering medications, medical decision making, coordinating care and patient verbalized understanding at the end of the visit.     Charlott Rakes, MD, FAAFP. [facility] and [facility], [place]   02/01/2020, 9:33 AM

## 2020-02-01 NOTE — Progress Notes (Signed)
States that she may have a yeast infection.

## 2020-02-05 ENCOUNTER — Other Ambulatory Visit: Payer: Self-pay

## 2020-02-05 ENCOUNTER — Ambulatory Visit: Payer: BC Managed Care – PPO | Attending: Family Medicine

## 2020-02-05 DIAGNOSIS — R6889 Other general symptoms and signs: Secondary | ICD-10-CM | POA: Diagnosis not present

## 2020-02-06 LAB — CBC WITH DIFFERENTIAL/PLATELET
Basophils Absolute: 0.1 10*3/uL (ref 0.0–0.2)
Basos: 1 %
EOS (ABSOLUTE): 0.2 10*3/uL (ref 0.0–0.4)
Eos: 3 %
Hematocrit: 36.6 % (ref 34.0–46.6)
Hemoglobin: 12.1 g/dL (ref 11.1–15.9)
Immature Grans (Abs): 0 10*3/uL (ref 0.0–0.1)
Immature Granulocytes: 0 %
Lymphocytes Absolute: 2.3 10*3/uL (ref 0.7–3.1)
Lymphs: 37 %
MCH: 29.4 pg (ref 26.6–33.0)
MCHC: 33.1 g/dL (ref 31.5–35.7)
MCV: 89 fL (ref 79–97)
Monocytes Absolute: 0.7 10*3/uL (ref 0.1–0.9)
Monocytes: 11 %
Neutrophils Absolute: 2.9 10*3/uL (ref 1.4–7.0)
Neutrophils: 48 %
Platelets: 249 10*3/uL (ref 150–450)
RBC: 4.12 x10E6/uL (ref 3.77–5.28)
RDW: 12 % (ref 11.7–15.4)
WBC: 6.2 10*3/uL (ref 3.4–10.8)

## 2020-02-06 LAB — T4, FREE: Free T4: 1.02 ng/dL (ref 0.82–1.77)

## 2020-02-06 LAB — TSH: TSH: 0.846 u[IU]/mL (ref 0.450–4.500)

## 2020-02-08 MED FILL — AMLODIPINE BESYLATE 5 MG TA: 5 | 30 days supply | Qty: 30 | Fill #2

## 2020-02-08 MED FILL — LISINOPRIL 20 MG TABLET: 20 | 30 days supply | Qty: 30 | Fill #2

## 2020-02-22 ENCOUNTER — Other Ambulatory Visit: Payer: Self-pay | Admitting: Family Medicine

## 2020-02-22 DIAGNOSIS — L304 Erythema intertrigo: Secondary | ICD-10-CM

## 2020-02-24 MED FILL — NYSTATIN 100000 UNIT/GM POW: 100000 | 30 days supply | Qty: 45 | Fill #0

## 2020-03-01 ENCOUNTER — Other Ambulatory Visit: Payer: Self-pay

## 2020-03-01 ENCOUNTER — Ambulatory Visit: Payer: BC Managed Care – PPO | Admitting: Cardiology

## 2020-03-01 ENCOUNTER — Encounter: Payer: Self-pay | Admitting: Cardiology

## 2020-03-01 VITALS — BP 130/81 | HR 73 | Ht 62.0 in | Wt 203.0 lb

## 2020-03-01 DIAGNOSIS — I10 Essential (primary) hypertension: Secondary | ICD-10-CM

## 2020-03-01 DIAGNOSIS — Z8616 Personal history of COVID-19: Secondary | ICD-10-CM

## 2020-03-01 DIAGNOSIS — Z7189 Other specified counseling: Secondary | ICD-10-CM

## 2020-03-01 DIAGNOSIS — R0789 Other chest pain: Secondary | ICD-10-CM

## 2020-03-01 DIAGNOSIS — I471 Supraventricular tachycardia: Secondary | ICD-10-CM | POA: Diagnosis not present

## 2020-03-01 NOTE — Progress Notes (Signed)
Cardiology Office Note:    Date:  03/01/2020   ID:  Barbara Dunn, DOB [birth year], MRN [id number]  PCP:  Charlott Rakes, MD  Cardiologist:  Buford Dresser, MD PhD  Referring MD: Charlott Rakes, MD   CC: follow up  History of Present Illness:    Barbara Dunn is a 49 y.o. female with a hx of hypertension, migraine, SVT seen during cath, noncardiac chest pain, palpitations who is seen for follow up today.   Today: Caught Covid at her job in February 2021. Thought it was bronchitis with the cough, but it didn't improve with treatments. Took three weeks off work to recover. Received bamlanivimab treatment.  Had concerning chest x ray but CT was unremarkable. Feels like she is back to her baseline, though allergies have been difficult this year.   We discussed Covid vaccines at length, data for people who have had personal Covid illness.  Otherwise doing well. Rare chronic chest pain, unchanged from prior. Hasn't felt any palpitations but typically can't. Checking BP at home, rare highs, occasional lows. Usually well controlled.  Denies shortness of breath at rest or with normal exertion. No PND, orthopnea, LE edema or unexpected weight gain. No syncope or palpitations.  ROS positive for chronic fatigue, which she attributes to working two jobs. She plans to change to one job only at the end of the year.  Past Medical History:  Diagnosis Date  . Anemia   . Arthritis   . Bronchitis   . Hypertension   . Migraines   . Obesity     Past Surgical History:  Procedure Laterality Date  . CARDIAC CATHETERIZATION N/A 08/02/2015   Procedure: Left Heart Cath and Coronary Angiography;  Surgeon: Troy Sine, MD;  Location: [facility];  Service: Cardiovascular;  Laterality: N/A;  . CERVIX LESION DESTRUCTION    . TUBAL LIGATION    . TUBAL LIGATION      Current Medications: Current Outpatient Medications on File Prior to Visit  Medication Sig  . albuterol (VENTOLIN HFA) 108  (90 Base) MCG/ACT inhaler INHALE 1 PUFF INTO THE LUNGS EVERY 6 HOURS AS NEEDED FOR WHEEZING OR SHORTNESS OF BREATH.  [place] amLODipine (NORVASC) 5 MG tablet Take 1 tablet (5 mg total) by mouth daily.  Marland Kitchen aspirin 81 MG tablet Take 81 mg by mouth daily.   . benzonatate (TESSALON) 100 MG capsule TAKE 1 CAPSULE (100 MG TOTAL) BY MOUTH 3 (THREE) TIMES DAILY.  . benzonatate (TESSALON) 200 MG capsule Take 1 capsule (200 mg total) by mouth 2 (two) times daily as needed for cough.  Marland Kitchen BIOTIN PO Take 2 tablets by mouth daily.   . cetirizine (ZYRTEC) 10 MG tablet Take 10 mg by mouth daily.  . chlorhexidine (PERIDEX) 0.12 % solution Use as directed 5 mLs in the mouth or throat as needed.   . docusate sodium (COLACE) 100 MG capsule Take 100 mg by mouth as needed.   . fluticasone (FLONASE) 50 MCG/ACT nasal spray PLACE 2 SPRAYS INTO BOTH NOSTRILS DAILY.  . fluticasone (FLOVENT HFA) 110 MCG/ACT inhaler Inhale 1 puff into the lungs 2 (two) times daily.  Mar[place] Kitchen HYDROcodone-homatropine (HYCODAN) 5-1.5 MG/5ML syrup Take 5 mLs by mouth every 6 (six) hours as needed for cough.  Marland Kitchen lisinopril (ZESTRIL) 20 MG tablet Take 1 tablet (20 mg total) by mouth daily.  . meclizine (ANTIVERT) 25 MG tablet Take 1 tablet (25 mg total) by mouth 3 (three) times daily as needed for dizziness.  . Multiple Vitamins-Minerals (MULTIVITAMIN  ADULT PO) Take 1 tablet by mouth daily.   . norgestimate-ethinyl estradiol (MONO-LINYAH) 0.25-35 MG-MCG tablet Take 1 tablet by mouth daily.  . NYSTATIN powder APPLY TOPICALLY 2 TIMES DAILY.  Mar[place] Kitchen ondansetron (ZOFRAN) 4 MG tablet Take 1 tablet (4 mg total) by mouth every 6 (six) hours.  . topiramate (TOPAMAX) 50 MG tablet Take 1 tablet (50 mg total) by mouth 2 (two) times daily.  . SUMAtriptan (IMITREX) 50 MG tablet Take 2 tablets (100 mg total) by mouth once for 1 dose. May repeat in 2 hours if headache persists, max 200mg /day  . [DISCONTINUED] diphenhydrAMINE (BENADRYL) 25 mg capsule Take 25 mg by mouth every 6 (six)  hours as needed for itching. Reported on 01/23/2016   No current facility-administered medications on file prior to visit.     Allergies:   Patient has no known allergies.   Social History   Tobacco Use  . Smoking status: Former Smoker    Quit date: 12/15/2003    Years since quitting: 16.2  . Smokeless tobacco: Never Used  . Tobacco comment: quit 10 years ago  Substance Use Topics  . Alcohol use: Yes    Alcohol/week: 2.0 standard drinks    Types: 1 Glasses of wine, 1 Shots of liquor per week    Comment: Once every 3 months   . Drug use: No    Family History: The patient's family history includes Hypertension in her mother; Schizophrenia in her brother, brother, mother, and sister. aunt had heart disease. Mom's side has HTN, DM as well  ROS:   Please see the history of present illness.  Additional pertinent ROS otherwise unremarkable.  EKGs/Labs/Other Studies Reviewed:    The following studies were reviewed today: Lexiscan myoview 06/2015  The left ventricular ejection fraction is hyperdynamic (>65%).  Nuclear stress EF: 71%.  There was no ST segment deviation noted during stress.  This is a low risk study.   Low risk stress nuclear study with a small, severe, predominantly fixed apical defect likely representing apical thinning; cannot R/O minimal apical ischemia; EF 71 with normal wall motion.  Heart cath 07/2015  The left ventricular systolic function is normal.  Normal coronary arteries.  Transient recurrent episodes of SVT, catheter induced which would break with coughing.   Normal LV function without focal segmental wall motion M normalities. Normal coronary arteries.  Recommendation: Suspect nonischemic chest pain.  Medical therapy for hypertension.  The patient may benefit from beta blocker therapy with history of palpitations and demonstrated recurrent bursts of SVT easily inducible during the procedure; alternatively, adjustment from amlodipine to either  Cardizem/verapamil may be considered.  EKG:  EKG is personally reviewed.  The ekg ordered today demonstrates sinus rhythm with sinus arrhythmia at 78 bpm.  Recent Labs: 12/30/2019: ALT 15; BUN 9; Creatinine, Ser 1.00; Potassium 4.2; Sodium 142 02/05/2020: Hemoglobin 12.1; Platelets 249; TSH 0.846  Recent Lipid Panel    Component Value Date/Time   CHOL 184 11/02/2019 1009   TRIG 70 11/02/2019 1009   HDL 86 11/02/2019 1009   CHOLHDL 2.1 11/02/2019 1009   CHOLHDL 1.8 07/27/2016 0917   VLDL 14 07/27/2016 0917   LDLCALC 85 11/02/2019 1009    Physical Exam:    VS:  BP 130/81   Pulse 73   Ht 5\' 2"  (1.575 m)   Wt 203 lb (92.1 kg)   SpO2 100%   BMI 37.13 kg/m     Wt Readings from Last 3 Encounters:  03/01/20 203 lb (92.1 kg)  11/02/19 208 lb (94.3 kg)  05/18/19 213 lb 12.8 oz (97 kg)    GEN: Well nourished, well developed in no acute distress HEENT: Normal, moist mucous membranes NECK: No JVD CARDIAC: regular rhythm, normal S1 and S2, no rubs or gallops. No murmur. VASCULAR: Radial and DP pulses 2+ bilaterally. No carotid bruits RESPIRATORY:  Clear to auscultation without rales, wheezing or rhonchi  ABDOMEN: Soft, non-tender, non-distended MUSCULOSKELETAL:  Ambulates independently SKIN: Warm and dry, no edema NEUROLOGIC:  Alert and oriented x 3. No focal neuro deficits noted. PSYCHIATRIC:  Normal affect   ASSESSMENT:    1. Other chest pain   2. Essential hypertension   3. Paroxysmal SVT (supraventricular tachycardia) (HCC)   4. Personal history of covid-19   5. Counseling on health promotion and disease prevention   6. Cardiac risk counseling    PLAN:    Chest pain: chronic, unchanged -Workup, including labs, monitor, cath, echo have not shown serious cardiac abnormalities. Cath was without CAD in 2016.   Paroxysmal SVT -seen during cath in 2016 and one episode on monitor -events are very brief, rare, and self limited.  Personal history of Covid-19 infection:  reviewed today, discussed recommendations and timing of vaccination based on current data  Hypertension: just at goal today -continue amlodipine, lisinopril  Primary prevention: -recommend heart healthy/Mediterranean diet, with whole grains, fruits, vegetable, fish, lean meats, nuts, and olive oil. Limit salt. -recommend moderate walking, 3-5 times/week for 30-50 minutes each session. Aim for at least 150 minutes.week. Goal should be pace of 3 miles/hours, or walking 1.5 miles in 30 minutes -recommend avoidance of tobacco products. Avoid excess alcohol. -Additional risk factor control: -Diabetes: A1c is 5.3, does not carry diagnosis of diabetes -Lipids: LDL 86, no CAD on cath -Weight: class 2 obesity, BMI 37, counseled on lifestyle as above. -given age, low risk, does not need to be on aspirin from a cardiac standpoint. Have discussed previously -ASCVD risk score: The 10-year ASCVD risk score Mikey Bussing DC Brooke Bonito., et al., 2013) is: 1.3%   Values used to calculate the score:     Age: 46 years     Sex: Female     Is Non-Hispanic African American: Yes     Diabetic: No     Tobacco smoker: No     Systolic Blood Pressure: [id number]     Is BP treated: Yes     HDL Cholesterol: 86 mg/dL     Total Cholesterol: 184 mg/dL   Plan for follow up: 2 years or sooner as needed  Medication Adjustments/Labs and Tests Ordered: Current medicines are reviewed at length with the patient today.  Concerns regarding medicines are outlined above.  Orders Placed This Encounter  Procedures  . EKG 12-Lead   No orders of the defined types were placed in this encounter.   Patient Instructions  Medication Instructions:  *If you need a refill on your cardiac medications before your next appointment, please call your pharmacy*  Follow-Up: At [facility], you and your health needs are our priority.  As part of our continuing mission to provide you with exceptional heart care,  we have created designated Provider Care Teams.  These Care Teams include your primary Cardiologist (physician) and Advanced Practice Providers (APPs -  Physician Assistants and Nurse Practitioners) who all work together to provide you with the care you need, when you need it.  We recommend signing up for the patient portal called "MyChart".  Sign up information is provided on this After Visit  Summary.  MyChart is used to connect with patients for Virtual Visits (Telemedicine).  Patients are able to view lab/test results, encounter notes, upcoming appointments, etc.  Non-urgent messages can be sent to your provider as well.   To learn more about what you can do with MyChart, go to [email or web address].    Your next appointment:   2 years  The format for your next appointment:   In Person  Provider:   Dr. Harrell Gave     Signed, Buford Dresser, MD PhD 03/01/2020   [place]

## 2020-03-01 NOTE — Patient Instructions (Signed)
Medication Instructions:  *If you need a refill on your cardiac medications before your next appointment, please call your pharmacy*  Follow-Up: At [facility], you and your health needs are our priority.  As part of our continuing mission to provide you with exceptional heart care, we have created designated Provider Care Teams.  These Care Teams include your primary Cardiologist (physician) and Advanced Practice Providers (APPs -  Physician Assistants and Nurse Practitioners) who all work together to provide you with the care you need, when you need it.  We recommend signing up for the patient portal called "MyChart".  Sign up information is provided on this After Visit Summary.  MyChart is used to connect with patients for Virtual Visits (Telemedicine).  Patients are able to view lab/test results, encounter notes, upcoming appointments, etc.  Non-urgent messages can be sent to your provider as well.   To learn more about what you can do with MyChart, go to [email or web address].    Your next appointment:   2 years  The format for your next appointment:   In Person  Provider:   Dr. Harrell Gave

## 2020-03-07 MED FILL — MONO-LINYAH 28 TABLET: 0.25-35 | 28 days supply | Qty: 28 | Fill #1

## 2020-03-07 MED FILL — AMLODIPINE BESYLATE 5 MG TA: 5 | 30 days supply | Qty: 30 | Fill #3

## 2020-03-07 MED FILL — SUMATRIPTAN SUCC 50 MG TAB: 50 | 30 days supply | Qty: 9 | Fill #3

## 2020-03-07 MED FILL — LISINOPRIL 20 MG TABLET: 20 | 30 days supply | Qty: 30 | Fill #3

## 2020-03-07 MED FILL — TOPIRAMATE 50 MG TABLET: 50 | 30 days supply | Qty: 60 | Fill #3

## 2020-03-22 MED FILL — FLUTICASONE PROP 50 MCG SPR: 50 | 30 days supply | Qty: 16 | Fill #1

## 2020-04-04 MED FILL — MONO-LINYAH 28 TABLET: 0.25-35 | 28 days supply | Qty: 28 | Fill #2

## 2020-04-11 MED FILL — TOPIRAMATE 50 MG TABLET: 50 | 30 days supply | Qty: 60 | Fill #4

## 2020-04-11 MED FILL — SUMATRIPTAN SUCC 50 MG TAB: 50 | 30 days supply | Qty: 9 | Fill #4

## 2020-04-11 MED FILL — AMLODIPINE BESYLATE 5 MG TA: 5 | 30 days supply | Qty: 30 | Fill #4

## 2020-04-11 MED FILL — NYSTATIN 100000 UNIT/GM POW: 100000 | 30 days supply | Qty: 45 | Fill #1

## 2020-04-11 MED FILL — LISINOPRIL 20 MG TABLET: 20 | 30 days supply | Qty: 30 | Fill #4

## 2020-04-21 ENCOUNTER — Encounter: Payer: Self-pay | Admitting: Cardiology

## 2020-04-21 DIAGNOSIS — I471 Supraventricular tachycardia, unspecified: Secondary | ICD-10-CM | POA: Insufficient documentation

## 2020-04-21 DIAGNOSIS — Z8616 Personal history of COVID-19: Secondary | ICD-10-CM | POA: Insufficient documentation

## 2020-05-02 ENCOUNTER — Other Ambulatory Visit: Payer: Self-pay | Admitting: Family Medicine

## 2020-05-02 DIAGNOSIS — Z3041 Encounter for surveillance of contraceptive pills: Secondary | ICD-10-CM

## 2020-05-02 MED FILL — MONO-LINYAH 28 TABLET: 0.25-35 | 28 days supply | Qty: 28 | Fill #0

## 2020-05-02 NOTE — Telephone Encounter (Signed)
Requested Prescriptions  Pending Prescriptions Disp Refills  . [place] 0.25-35 MG-MCG tablet [Pharmacy Med Name: MONO-LINYAH 28 TABLET 0.25-35 Tablet] 28 tablet 2    Sig: TAKE 1 TABLET BY MOUTH DAILY.     OB/GYN:  Contraceptives Passed - 05/02/2020  9:37 AM      Passed - Last BP in normal range    BP Readings from Last 1 Encounters:  03/01/20 130/81         Passed - Valid encounter within last 12 months    Recent Outpatient Visits          3 months ago Cold intolerance   [place], [place], MD   4 months ago Abnormal findings on diagnostic imaging of lung   [place], [place], MD   6 months ago Annual physical exam   [place], Charlane Ferretti, MD   11 months ago Class 2 obesity due to excess calories without serious comorbidity with body mass index (BMI) of 39.0 to 39.9 in adult   [place], [place], MD   1 year ago Acute cystitis without hematuria   [facility] Charlott Rakes, MD

## 2020-05-09 MED FILL — LISINOPRIL 20 MG TABLET: 20 | 30 days supply | Qty: 30 | Fill #5

## 2020-05-09 MED FILL — AMLODIPINE BESYLATE 5 MG TA: 5 | 30 days supply | Qty: 30 | Fill #5

## 2020-05-30 ENCOUNTER — Other Ambulatory Visit: Payer: Self-pay | Admitting: Family Medicine

## 2020-05-30 DIAGNOSIS — L304 Erythema intertrigo: Secondary | ICD-10-CM

## 2020-05-30 MED FILL — MONO-LINYAH 28 TABLET: 0.25-35 | 28 days supply | Qty: 28 | Fill #1

## 2020-05-30 MED FILL — SUMATRIPTAN SUCC 50 MG TAB: 50 | 30 days supply | Qty: 9 | Fill #0

## 2020-05-30 NOTE — Telephone Encounter (Signed)
Requested medication (s) are due for refill today:   Provider to decide  Requested medication (s) are on the active medication list:   Yes  Future visit scheduled:   No   Last ordered: 02/24/2020 45 g, RF 1  No protocol assigned to this medication.   Requested Prescriptions  Pending Prescriptions Disp Refills   nystatin (MYCOSTATIN/NYSTOP) powder [Pharmacy Med Name: NYSTATIN 100,000 UNIT/GM PO 100000 Powder] 45 g 1    Sig: APPLY TOPICALLY 2 TIMES DAILY.      Off-Protocol Failed - 05/30/2020 11:11 AM      Failed - Medication not assigned to a protocol, review manually.      Passed - Valid encounter within last 12 months    Recent Outpatient Visits           3 months ago Cold intolerance   [place], [place], MD   5 months ago Abnormal findings on diagnostic imaging of lung   [place], [place], MD   7 months ago Annual physical exam   [place], Charlane Ferretti, MD   1 year ago Class 2 obesity due to excess calories without serious comorbidity with body mass index (BMI) of 39.0 to 39.9 in adult   [place], [place], MD   1 year ago Acute cystitis without hematuria   [facility] Charlott Rakes, MD

## 2020-06-01 ENCOUNTER — Other Ambulatory Visit: Payer: Self-pay | Admitting: Family Medicine

## 2020-06-01 MED FILL — NYSTATIN 100,000 UNIT/GM PO: 100000 | 15 days supply | Qty: 45 | Fill #0

## 2020-06-13 MED FILL — LISINOPRIL 20 MG TABLET: 20 | 30 days supply | Qty: 30 | Fill #0

## 2020-06-13 MED FILL — AMLODIPINE BESYLATE 5 MG TA: 5 | 30 days supply | Qty: 30 | Fill #0

## 2020-06-13 MED FILL — TOPIRAMATE 50 MG TABLET: 50 | 30 days supply | Qty: 60 | Fill #0

## 2020-06-28 MED FILL — MONO-LINYAH 28 TABLET: 0.25-35 | 28 days supply | Qty: 28 | Fill #2

## 2020-06-29 IMAGING — CT CT CHEST W/ CM
2 of 4 series · 15 of 36 positions shown, 18 images · IV contrast (Omni 300)
Comparison: 12/15/2019

CLINICAL DATA: Abnormal chest x-ray, pulmonary nodule

EXAM:
CT CHEST WITH CONTRAST
TECHNIQUE: Multidetector CT imaging of the chest was performed during
intravenous contrast administration.
CONTRAST:  100mL OMNIPAQUE IOHEXOL 300 MG/ML  SOLN

[Series 5: chest with 2mm st cor · coronal · 0.61mm/px · 3 of 140 slices shown]
[im 28/140  lung]
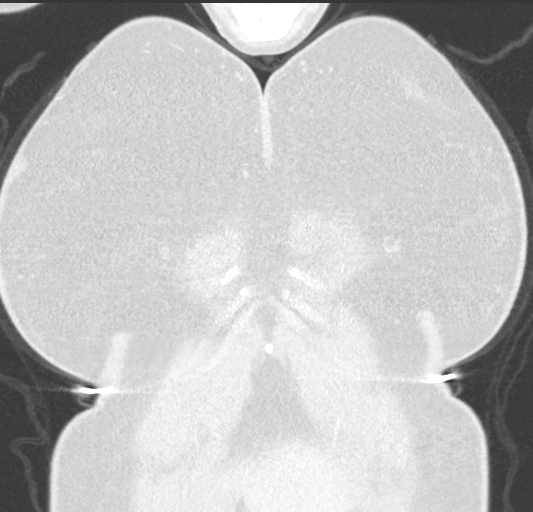
[im 56/140  lung]
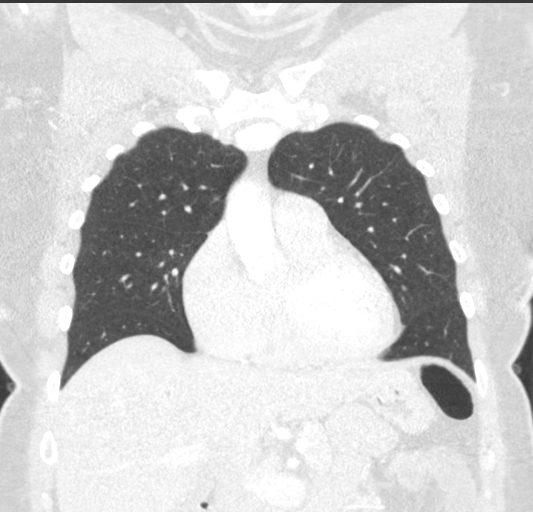
[im 84/140  lung]
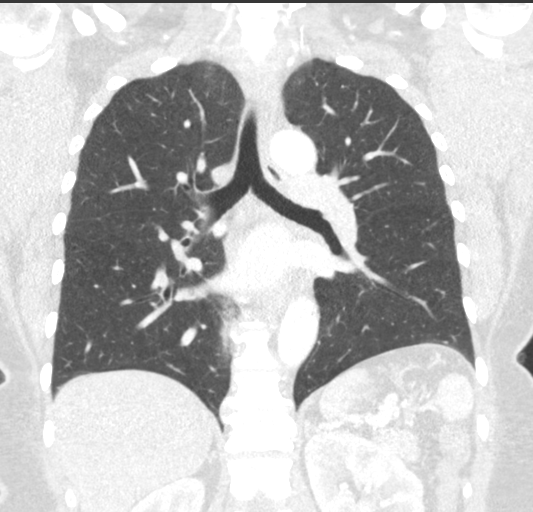

[Series 7: chest with 1mm st · axial · 0.71mm/px · z∈[+1003,+1276]mm · 12 of 309 slices shown, 15 images]
[im 18/309  mediastinal]
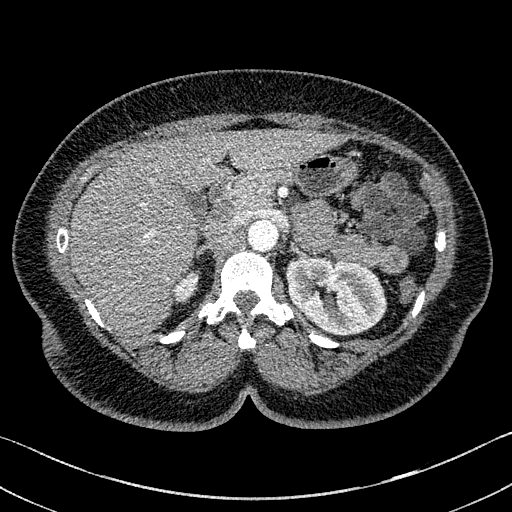
[im 18/309  lung]
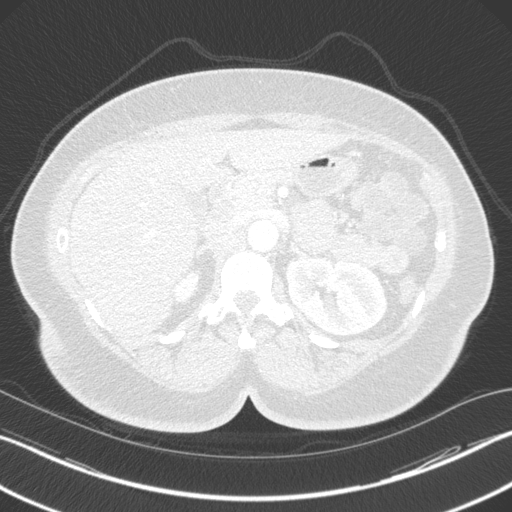
[im 52/309  lung]
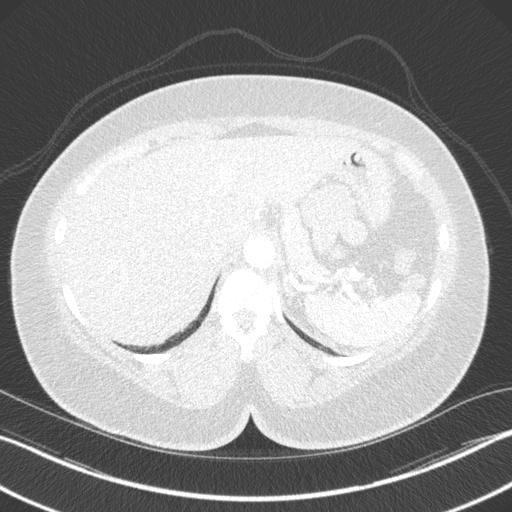
[im 69/309  lung]
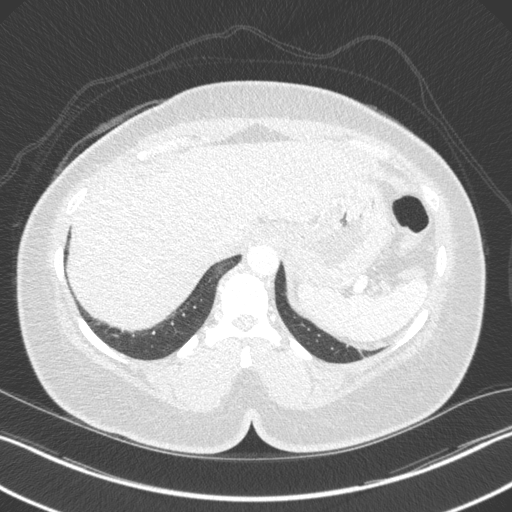
[im 86/309  lung]
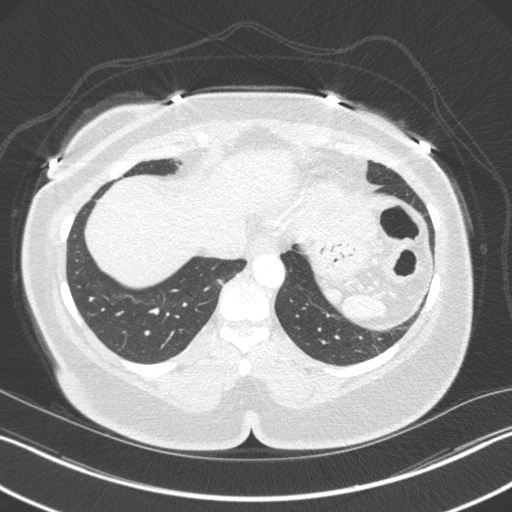
[im 120/309  mediastinal]
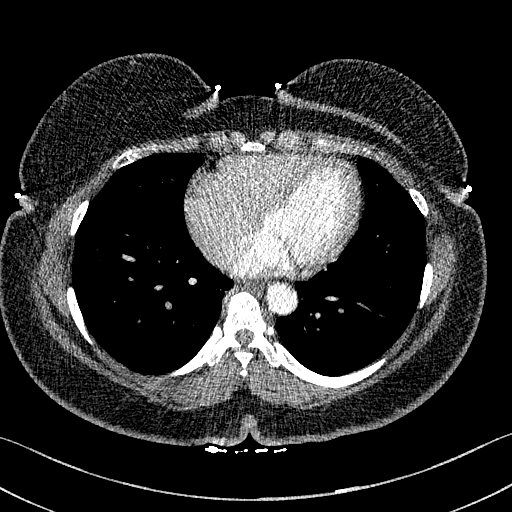
[im 120/309  lung]
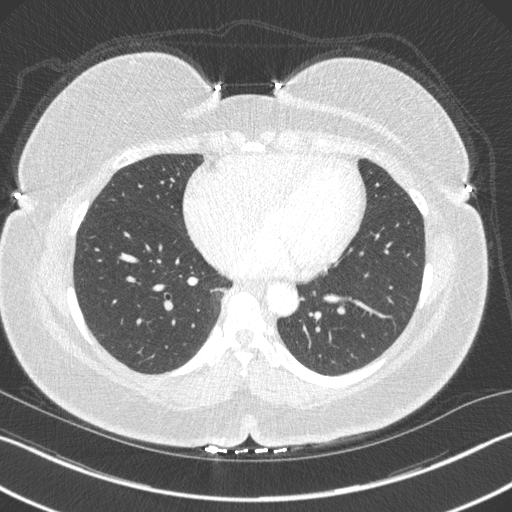
[im 137/309  lung]
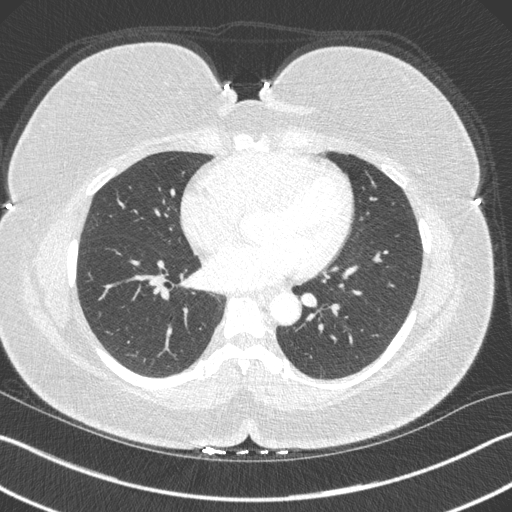
[im 172/309  lung]
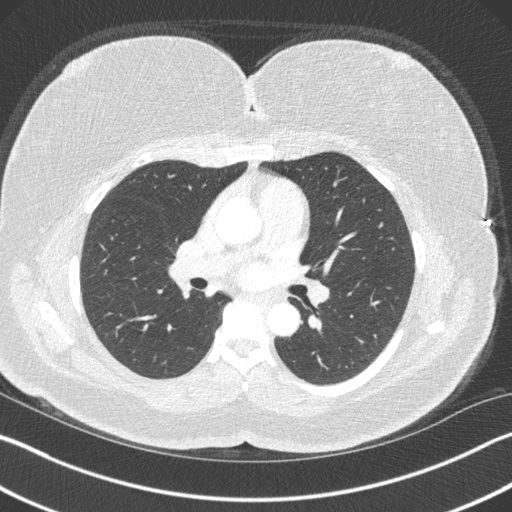
[im 189/309  lung]
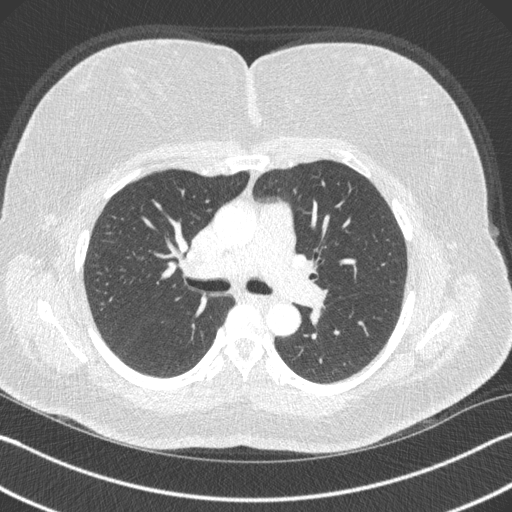
[im 223/309  mediastinal]
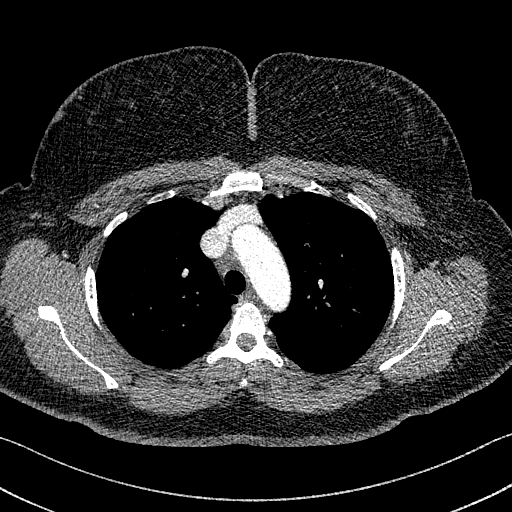
[im 223/309  lung]
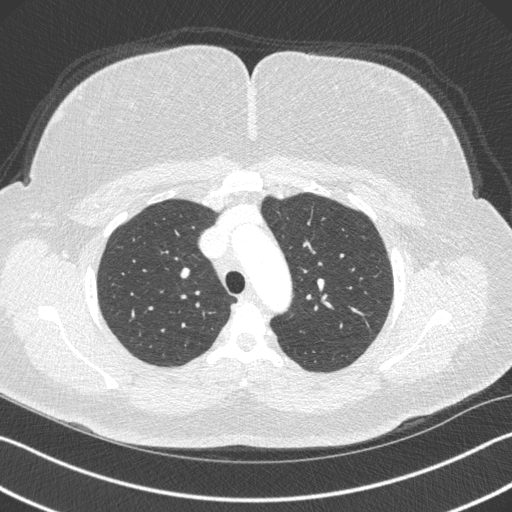
[im 240/309  lung]
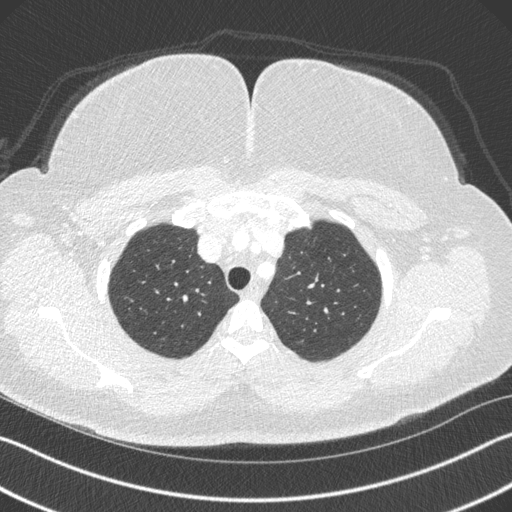
[im 257/309  lung]
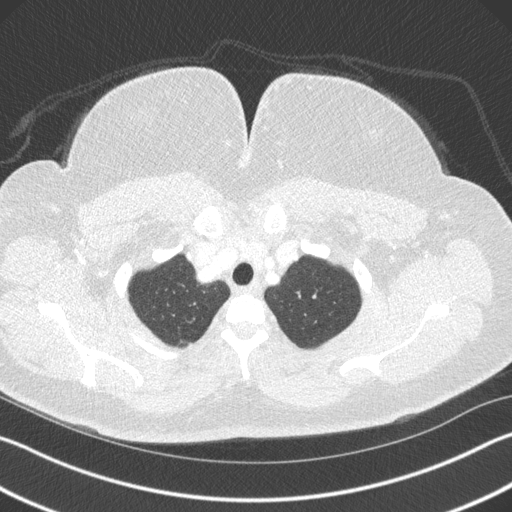
[im 291/309  lung]
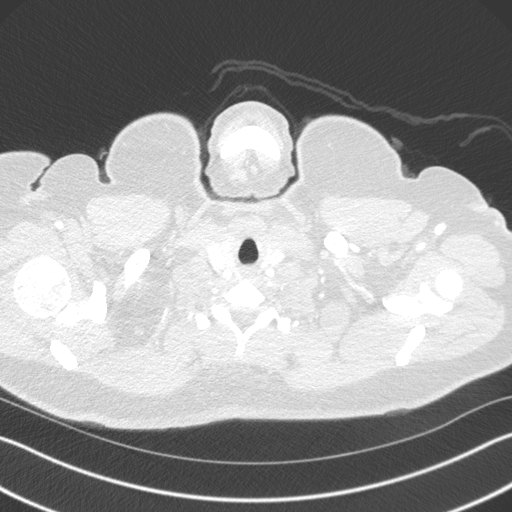

[15 of 36 positions shown; findings below may reference images not displayed]

FINDINGS: Cardiovascular: Heart and great vessels are unremarkable without
pericardial effusion. Thoracic aorta is normal in caliber without
dissection.

Mediastinum/Nodes: No enlarged mediastinal, hilar, or axillary lymph
nodes. Thyroid gland, trachea, and esophagus demonstrate no
significant findings.

Lungs/Pleura: No airspace disease, effusion, or pneumothorax. No
pulmonary nodules or masses. Central airways are patent.

The nodule projecting over the right anterior fourth rib on chest CT
likely reflects superimposed vascular shadows. No CT abnormality in
this region.

Upper Abdomen: No acute abnormality.

Musculoskeletal: No acute or destructive bony lesions. Reconstructed
images demonstrate no additional findings.
IMPRESSION: 1. No acute intrathoracic process.
2. Specifically, no abnormality in the right chest to correspond
with the chest x-ray findings.

## 2020-07-11 MED FILL — AMLODIPINE BESYLATE 5 MG TA: 5 | 30 days supply | Qty: 30 | Fill #1

## 2020-07-11 MED FILL — LISINOPRIL 20 MG TABLET: 20 | 30 days supply | Qty: 30 | Fill #1

## 2020-07-25 ENCOUNTER — Other Ambulatory Visit: Payer: Self-pay | Admitting: Family Medicine

## 2020-07-25 DIAGNOSIS — Z3041 Encounter for surveillance of contraceptive pills: Secondary | ICD-10-CM

## 2020-07-25 MED FILL — MONO-LINYAH 28 TABLET: 0.25-35 | 28 days supply | Qty: 28 | Fill #0

## 2020-07-25 MED FILL — NYSTATIN 100,000 UNIT/GM PO: 100000 | 15 days supply | Qty: 45 | Fill #1

## 2020-08-08 MED FILL — LISINOPRIL 20 MG TABLET: 20 | 30 days supply | Qty: 30 | Fill #2

## 2020-08-08 MED FILL — TOPIRAMATE 50 MG TABLET: 50 | 30 days supply | Qty: 60 | Fill #1

## 2020-08-08 MED FILL — AMLODIPINE BESYLATE 5 MG TA: 5 | 30 days supply | Qty: 30 | Fill #2

## 2020-08-22 MED FILL — VENTOLIN HFA 90 MCG INHALER: 108 (90 BAS | 25 days supply | Qty: 18 | Fill #0

## 2020-08-22 MED FILL — MONO-LINYAH 28 TABLET: 0.25-35 | 28 days supply | Qty: 28 | Fill #1

## 2020-09-12 MED FILL — AMLODIPINE BESYLATE 5 MG TA: 5 | 30 days supply | Qty: 30 | Fill #3

## 2020-09-12 MED FILL — LISINOPRIL 20 MG TABLET: 20 | 30 days supply | Qty: 30 | Fill #3

## 2020-09-19 MED FILL — MONO-LINYAH 28 TABLET: 0.25-35 | 28 days supply | Qty: 28 | Fill #2

## 2020-10-10 ENCOUNTER — Other Ambulatory Visit: Payer: Self-pay | Admitting: Family Medicine

## 2020-10-10 DIAGNOSIS — L304 Erythema intertrigo: Secondary | ICD-10-CM

## 2020-10-10 MED FILL — AMLODIPINE BESYLATE 5 MG TA: 5 | 30 days supply | Qty: 30 | Fill #4

## 2020-10-10 MED FILL — LISINOPRIL 20 MG TABLET: 20 | 30 days supply | Qty: 30 | Fill #4

## 2020-10-10 MED FILL — TOPIRAMATE 50 MG TABLET: 50 | 30 days supply | Qty: 60 | Fill #2

## 2020-10-10 NOTE — Telephone Encounter (Signed)
Requested medication (s) are due for refill today: yes  Requested medication (s) are on the active medication list: yes  Last refill: 07/25/2020  Future visit scheduled:no  Notes to clinic:  Medication not assigned to a protocol, review manually   Requested Prescriptions  Pending Prescriptions Disp Refills   nystatin (MYCOSTATIN/NYSTOP) powder [Pharmacy Med Name: NYSTATIN 100,000 UNIT/GM PO 100000 Powder] 45 g 1    Sig: APPLY TOPICALLY 2 TIMES DAILY.      Off-Protocol Failed - 10/10/2020  8:55 AM      Failed - Medication not assigned to a protocol, review manually.      Passed - Valid encounter within last 12 months    Recent Outpatient Visits           8 months ago Cold intolerance   [place], [place], MD   9 months ago Abnormal findings on diagnostic imaging of lung   [place], [place], MD   11 months ago Annual physical exam   [place], Charlane Ferretti, MD   1 year ago Class 2 obesity due to excess calories without serious comorbidity with body mass index (BMI) of 39.0 to 39.9 in adult   [place], [place], MD   1 year ago Acute cystitis without hematuria   [facility] Charlott Rakes, MD

## 2020-10-11 ENCOUNTER — Other Ambulatory Visit: Payer: Self-pay | Admitting: Family Medicine

## 2020-10-11 DIAGNOSIS — Z3041 Encounter for surveillance of contraceptive pills: Secondary | ICD-10-CM

## 2020-10-18 MED FILL — MONO-LINYAH 28 TABLET: 0.25-35 | 28 days supply | Qty: 28 | Fill #0

## 2020-11-08 ENCOUNTER — Encounter: Payer: Self-pay | Admitting: Family Medicine

## 2020-11-08 ENCOUNTER — Other Ambulatory Visit: Payer: Self-pay

## 2020-11-08 ENCOUNTER — Other Ambulatory Visit: Payer: Self-pay | Admitting: Family Medicine

## 2020-11-08 ENCOUNTER — Ambulatory Visit: Payer: 59 | Attending: Family Medicine | Admitting: Family Medicine

## 2020-11-08 VITALS — BP 137/86 | HR 82 | Temp 98.7°F | Resp 18 | Ht 63.0 in | Wt 207.0 lb

## 2020-11-08 DIAGNOSIS — J302 Other seasonal allergic rhinitis: Secondary | ICD-10-CM

## 2020-11-08 DIAGNOSIS — N898 Other specified noninflammatory disorders of vagina: Secondary | ICD-10-CM

## 2020-11-08 DIAGNOSIS — L304 Erythema intertrigo: Secondary | ICD-10-CM

## 2020-11-08 DIAGNOSIS — Z131 Encounter for screening for diabetes mellitus: Secondary | ICD-10-CM

## 2020-11-08 DIAGNOSIS — G43009 Migraine without aura, not intractable, without status migrainosus: Secondary | ICD-10-CM

## 2020-11-08 DIAGNOSIS — I10 Essential (primary) hypertension: Secondary | ICD-10-CM

## 2020-11-08 DIAGNOSIS — Z3041 Encounter for surveillance of contraceptive pills: Secondary | ICD-10-CM

## 2020-11-08 DIAGNOSIS — L309 Dermatitis, unspecified: Secondary | ICD-10-CM

## 2020-11-08 MED ORDER — NORGESTIMATE-ETH ESTRADIOL 0.25-35 MG-MCG PO TABS: 1.0000 | ORAL_TABLET | Freq: Every day | ORAL | 6 refills | Status: DC

## 2020-11-08 MED ORDER — NYSTATIN 100000 UNIT/GM EX POWD: CUTANEOUS | 1 refills | Status: DC

## 2020-11-08 MED ORDER — TRIAMCINOLONE ACETONIDE 0.1 % EX CREA
1.0000 | TOPICAL_CREAM | Freq: Two times a day (BID) | CUTANEOUS | 0 refills | Status: DC
Start: 2020-11-08 — End: 2020-11-08

## 2020-11-08 MED ORDER — SUMATRIPTAN SUCCINATE 50 MG PO TABS: 100.0000 mg | ORAL_TABLET | Freq: Once | ORAL | 6 refills | Status: DC

## 2020-11-08 MED ORDER — FLOVENT HFA 110 MCG/ACT IN AERO: 1.0000 | INHALATION_SPRAY | Freq: Two times a day (BID) | RESPIRATORY_TRACT | 2 refills | Status: DC

## 2020-11-08 MED ORDER — TOPIRAMATE 50 MG PO TABS: 50.0000 mg | ORAL_TABLET | Freq: Two times a day (BID) | ORAL | 6 refills | Status: DC

## 2020-11-08 MED ORDER — LISINOPRIL 20 MG PO TABS: 20.0000 mg | ORAL_TABLET | Freq: Every day | ORAL | 6 refills | Status: DC

## 2020-11-08 MED ORDER — AMLODIPINE BESYLATE 5 MG PO TABS: 5.0000 mg | ORAL_TABLET | Freq: Every day | ORAL | 6 refills | Status: DC

## 2020-11-08 MED FILL — AMLODIPINE BESYLATE 5 MG TA: 5 | 30 days supply | Qty: 30 | Fill #0

## 2020-11-08 MED FILL — TRIAMCINOLONE 0.1% CREAM: 0.1 | 15 days supply | Qty: 30 | Fill #0

## 2020-11-08 MED FILL — LISINOPRIL 20 MG TABLET: 20 | 30 days supply | Qty: 30 | Fill #0

## 2020-11-08 NOTE — Patient Instructions (Signed)
Intertrigo Intertrigo is skin irritation (inflammation) that happens in warm, moist areas of the body. The irritation can cause a rash and make skin raw and itchy. The rash is usually pink or red. It happens mostly between folds of skin or where skin rubs together, such as:  Between the toes.  In the armpits.  In the groin area.  Under the belly.  Under the breasts.  Around the butt area. This condition is not passed from person to person (is not contagious). What are the causes?  Heat, moisture, rubbing, and not enough air movement.  The condition can be made worse by: ? Sweat. ? Bacteria. ? A fungus, such as yeast. What increases the risk?  Moisture in your skin folds.  You are more likely to develop this condition if you: ? Have diabetes. ? Are overweight. ? Are not able to move around. ? Live in a warm and moist climate. ? Wear splints, braces, or other medical devices. ? Are not able to control your pee (urine) or poop (stool). What are the signs or symptoms?  A pink or red skin rash in the skin fold or near the skin fold.  Raw or scaly skin.  Itching.  A burning feeling.  Bleeding.  Leaking fluid.  A bad smell. How is this treated?  Cleaning and drying your skin.  Taking an antibiotic medicine or using an antibiotic skin cream for a bacterial infection.  Using an antifungal cream on your skin or taking pills for an infection that was caused by a fungus, such as yeast.  Using a steroid ointment to stop the itching and irritation.  Separating the skin fold with a clean cotton cloth to absorb moisture and allow air to flow into the area. Follow these instructions at home:  Keep the affected area clean and dry.  Do not scratch your skin.  Stay cool as much as you can. Use an air conditioner or a fan, if you have one.  Apply over-the-counter and prescription medicines only as told by your doctor.  If you were prescribed an antibiotic medicine,  use it as told by your doctor. Do not stop using the antibiotic even if your condition starts to get better.  Keep all follow-up visits as told by your doctor. This is important. How is this prevented?  Stay at a healthy weight.  Take care of your feet. This is very important if you have diabetes. You should: ? Wear shoes that fit well. ? Keep your feet dry. ? Wear clean cotton or wool socks.  Protect the skin in your groin and butt area as told by your doctor. To do this: ? Follow a regular cleaning routine. ? Use creams, powders, or ointments that protect your skin. ? Change protection pads often.  Do not wear tight clothes. Wear clothes that: ? Are loose. ? Take moisture away from your body. ? Are made of cotton.  Wear a bra that gives good support, if needed.  Shower and dry yourself well after being active. Use a hair dryer on a cool setting to dry between skin folds.  Keep your blood sugar under control if you have diabetes.   Contact a doctor if:  Your symptoms do not get better with treatment.  Your symptoms get worse or they spread.  You notice more redness and warmth.  You have a fever. Summary  Intertrigo is skin irritation that occurs when folds of skin rub together.  This condition is caused by heat,   moisture, and rubbing.  This condition may be treated by cleaning and drying your skin and with medicines.  Apply over-the-counter and prescription medicines only as told by your doctor.  Keep all follow-up visits as told by your doctor. This is important. This information is not intended to replace advice given to you by your health care provider. Make sure you discuss any questions you have with your health care provider. Document Revised: 07/17/2018 Document Reviewed: 07/17/2018 Elsevier Patient Education  2021 [employer] Inc.  

## 2020-11-08 NOTE — Progress Notes (Signed)
Patient has taken medication today. Patient has not eaten today. Patient denies pain. Patient complains of a discharge. Patient request medication refill.

## 2020-11-08 NOTE — Progress Notes (Signed)
Subjective:  Patient ID: Barbara Dunn, female    DOB: 1970-12-26  Age: 50 y.o. MRN: [id number]  CC:  Chief Complaint  Patient presents with  . Vaginal Discharge     HPI Barbara Dunn presents is a 50year-old female with a history of hypertension, migraine, obesity seen fora follow up of chronic medical conditions. She has had vaginal discharge but no vaginal itching and states the discharge is chronic.  She has no urinary symptoms. Complains of a pruritic rash on both sides of her neck and has not been using any necklace around her neck except today.  She is wondering if this could be eczema. Also continues to have intermittent rash beneath her breast where she sweats a lot and is requesting refill of her nystatin powder. Her migraines have been stable.   Past Medical History:  Diagnosis Date  . Anemia   . Arthritis   . Bronchitis   . Hypertension   . Migraines   . Obesity     Past Surgical History:  Procedure Laterality Date  . CARDIAC CATHETERIZATION N/A 08/02/2015   Procedure: Left Heart Cath and Coronary Angiography;  Surgeon: Troy Sine, MD;  Location: [facility];  Service: Cardiovascular;  Laterality: N/A;  . CERVIX LESION DESTRUCTION    . TUBAL LIGATION    . TUBAL LIGATION      Family History  Problem Relation Age of Onset  . Hypertension Mother   . Schizophrenia Mother   . Schizophrenia Sister   . Schizophrenia Brother   . Schizophrenia Brother     No Known Allergies  Outpatient Medications Prior to Visit  Medication Sig Dispense Refill  . albuterol (VENTOLIN HFA) 108 (90 Base) MCG/ACT inhaler INHALE 1 PUFF INTO THE LUNGS EVERY 6 HOURS AS NEEDED FOR WHEEZING OR SHORTNESS OF BREATH. 18 g 1  . aspirin 81 MG tablet Take 81 mg by mouth daily.     . benzonatate (TESSALON) 100 MG capsule TAKE 1 CAPSULE (100 MG TOTAL) BY MOUTH 3 (THREE) TIMES DAILY. 30 capsule 0  . BIOTIN PO Take 2 tablets by mouth daily.     . cetirizine (ZYRTEC) 10 MG tablet Take  10 mg by mouth daily.    . chlorhexidine (PERIDEX) 0.12 % solution Use as directed 5 mLs in the mouth or throat as needed.     . docusate sodium (COLACE) 100 MG capsule Take 100 mg by mouth as needed.     . fluticasone (FLONASE) 50 MCG/ACT nasal spray PLACE 2 SPRAYS INTO BOTH NOSTRILS DAILY. 16 g 2  . HYDROcodone-homatropine (HYCODAN) 5-1.5 MG/5ML syrup Take 5 mLs by mouth every 6 (six) hours as needed for cough. 120 mL 0  . meclizine (ANTIVERT) 25 MG tablet Take 1 tablet (25 mg total) by mouth 3 (three) times daily as needed for dizziness. 60 tablet 1  . Multiple Vitamins-Minerals (MULTIVITAMIN ADULT PO) Take 1 tablet by mouth daily.     . ondansetron (ZOFRAN) 4 MG tablet Take 1 tablet (4 mg total) by mouth every 6 (six) hours. 12 tablet 0  . amLODipine (NORVASC) 5 MG tablet Take 1 tablet (5 mg total) by mouth daily. 30 tablet 6  . benzonatate (TESSALON) 200 MG capsule Take 1 capsule (200 mg total) by mouth 2 (two) times daily as needed for cough. 20 capsule 0  . fluticasone (FLOVENT HFA) 110 MCG/ACT inhaler Inhale 1 puff into the lungs 2 (two) times daily. 1 Inhaler 2  . lisinopril (ZESTRIL) 20 MG tablet  Take 1 tablet (20 mg total) by mouth daily. 30 tablet 6  . MONO-LINYAH 0.25-35 MG-MCG tablet TAKE 1 TABLET BY MOUTH DAILY. 28 tablet 2  . nystatin (MYCOSTATIN/NYSTOP) powder APPLY TOPICALLY 2 TIMES DAILY. 45 g 1  . SUMAtriptan (IMITREX) 50 MG tablet Take 2 tablets (100 mg total) by mouth once for 1 dose. May repeat in 2 hours if headache persists, max 23m/day 10 tablet 6  . topiramate (TOPAMAX) 50 MG tablet Take 1 tablet (50 mg total) by mouth 2 (two) times daily. 60 tablet 6   No facility-administered medications prior to visit.     ROS Review of Systems  Constitutional: Negative for activity change, appetite change and fatigue.  HENT: Negative for congestion, sinus pressure and sore throat.   Eyes: Negative for visual disturbance.  Respiratory: Negative for cough, chest tightness,  shortness of breath and wheezing.   Cardiovascular: Negative for chest pain and palpitations.  Gastrointestinal: Negative for abdominal distention, abdominal pain and constipation.  Endocrine: Negative for polydipsia.  Genitourinary: Negative for dysuria and frequency.  Musculoskeletal: Negative for arthralgias and back pain.  Skin: Positive for rash.  Neurological: Negative for tremors, light-headedness and numbness.  Hematological: Does not bruise/bleed easily.  Psychiatric/Behavioral: Negative for agitation and behavioral problems.    Objective:  BP 137/86 (BP Location: Left Arm, Patient Position: Sitting, Cuff Size: Large)   Pulse 82   Temp 98.7 F (37.1 C) (Oral)   Resp 18   Ht _0  (1.6 m)   Wt 207 lb (93.9 kg)   SpO2 100%   BMI 36.67 kg/m   BP/Weight 11/08/2020 03/01/2020 27/68/0881 Systolic BP [id number] Diastolic BP 86 81 68  Wt. (Lbs) 207 203 -  BMI 36.67 37.13 -      Physical Exam Constitutional:      Appearance: She is well-developed.  Neck:     Vascular: No JVD.  Cardiovascular:     Rate and Rhythm: Normal rate.     Heart sounds: Normal heart sounds. No murmur heard.   Pulmonary:     Effort: Pulmonary effort is normal.     Breath sounds: Normal breath sounds. No wheezing or rales.  Chest:     Chest wall: No tenderness.  Abdominal:     General: Bowel sounds are normal. There is no distension.     Palpations: Abdomen is soft. There is no mass.     Tenderness: There is no abdominal tenderness.  Musculoskeletal:        General: Normal range of motion.     Right lower leg: No edema.     Left lower leg: No edema.  Skin:    Comments: Scaly rash on bilateral aspect of neck, left greater than right No inframammary rash bilaterally but residue of powder noted  Neurological:     Mental Status: She is alert and oriented to person, place, and time.  Psychiatric:        Mood and Affect: Mood normal.     CMP Latest Ref Rng & Units 12/30/2019 11/02/2019  03/31/2019  Glucose 65 - 99 mg/dL 82 81 73  BUN 6 - 24 mg/dL _1 Creatinine 0.57 - 1.00 mg/dL 1.00 0.95 1.19(H)  Sodium 134 - 144 mmol/L 142 137 141  Potassium 3.5 - 5.2 mmol/L 4.2 3.7 4.0  Chloride 96 - 106 mmol/L 109(H) 104 108(H)  CO2 20 - 29 mmol/L _2 Calcium 8.7 - 10.2 mg/dL 9.0 9.0 8.8  Total  Protein 6.0 - 8.5 g/dL 6.8 6.6 6.7  Total Bilirubin 0.0 - 1.2 mg/dL 0.3 0.3 0.3  Alkaline Phos 39 - 117 IU/L 50 58 50  AST 0 - 40 IU/L _0 ALT 0 - 32 IU/L _1 Lipid Panel     Component Value Date/Time   CHOL 184 11/02/2019 1009   TRIG 70 11/02/2019 1009   HDL 86 11/02/2019 1009   CHOLHDL 2.1 11/02/2019 1009   CHOLHDL 1.8 07/27/2016 0917   VLDL 14 07/27/2016 0917   LDLCALC 85 11/02/2019 1009    CBC    Component Value Date/Time   WBC 6.2 02/05/2020 0957   WBC 5.7 01/10/2016 0833   RBC 4.12 02/05/2020 0957   RBC 4.15 01/10/2016 0833   HGB 12.1 02/05/2020 0957   HCT 36.6 02/05/2020 0957   PLT 249 02/05/2020 0957   MCV 89 02/05/2020 0957   MCH 29.4 02/05/2020 0957   MCH 28.9 01/10/2016 0833   MCHC 33.1 02/05/2020 0957   MCHC 32.0 01/10/2016 0833   RDW 12.0 02/05/2020 0957   LYMPHSABS 2.3 02/05/2020 0957   MONOABS 0.7 10/19/2015 1008   EOSABS 0.2 02/05/2020 0957   BASOSABS 0.1 02/05/2020 0957    Lab Results  Component Value Date   HGBA1C 5.3 01/01/2018    Assessment & Plan:  1. Screening for diabetes mellitus A1c ordered  2. Intertrigo Advised to wear loose clothing when she is at home Weight loss will be beneficial - nystatin (MYCOSTATIN/NYSTOP) powder; APPLY TOPICALLY 2 TIMES DAILY.  Dispense: 45 g; Refill: 1 - Hemoglobin A1c  3. Vaginal discharge - Cervicovaginal ancillary only  4. Migraine without aura and without status migrainosus, not intractable Stable - topiramate (TOPAMAX) 50 MG tablet; Take 1 tablet (50 mg total) by mouth 2 (two) times daily.  Dispense: 60 tablet; Refill: 6 - SUMAtriptan (IMITREX) 50 MG tablet; Take 2  tablets (100 mg total) by mouth once for 1 dose. May repeat in 2 hours if headache persists, max 253m/day  Dispense: 10 tablet; Refill: 6  5. Oral contraceptive use - norgestimate-ethinyl estradiol (MONO-LINYAH) 0.25-35 MG-MCG tablet; Take 1 tablet by mouth daily.  Dispense: 28 tablet; Refill: 6  6. Essential hypertension Controlled Counseled on blood pressure goal of less than 130/80, low-sodium, DASH diet, medication compliance, 150 minutes of moderate intensity exercise per week. Discussed medication compliance, adverse effects. - lisinopril (ZESTRIL) 20 MG tablet; Take 1 tablet (20 mg total) by mouth daily.  Dispense: 30 tablet; Refill: 6 - amLODipine (NORVASC) 5 MG tablet; Take 1 tablet (5 mg total) by mouth daily.  Dispense: 30 tablet; Refill: 6 - CMP14+EGFR  7. Seasonal allergies - fluticasone (FLOVENT HFA) 110 MCG/ACT inhaler; Inhale 1 puff into the lungs 2 (two) times daily.  Dispense: 1 each; Refill: 2  8. Dermatitis Placed on triamcinolone Suspicious for contact dermatitis    Meds ordered this encounter  Medications  . triamcinolone (KENALOG) 0.1 %    Sig: Apply 1 application topically 2 (two) times daily.    Dispense:  30 g    Refill:  0  . nystatin (MYCOSTATIN/NYSTOP) powder    Sig: APPLY TOPICALLY 2 TIMES DAILY.    Dispense:  45 g    Refill:  1  . topiramate (TOPAMAX) 50 MG tablet    Sig: Take 1 tablet (50 mg total) by mouth 2 (two) times daily.    Dispense:  60 tablet    Refill:  6  . SUMAtriptan (IMITREX) 50  MG tablet    Sig: Take 2 tablets (100 mg total) by mouth once for 1 dose. May repeat in 2 hours if headache persists, max 252m/day    Dispense:  10 tablet    Refill:  6  . norgestimate-ethinyl estradiol (MONO-LINYAH) 0.25-35 MG-MCG tablet    Sig: Take 1 tablet by mouth daily.    Dispense:  28 tablet    Refill:  6  . lisinopril (ZESTRIL) 20 MG tablet    Sig: Take 1 tablet (20 mg total) by mouth daily.    Dispense:  30 tablet    Refill:  6  .  fluticasone (FLOVENT HFA) 110 MCG/ACT inhaler    Sig: Inhale 1 puff into the lungs 2 (two) times daily.    Dispense:  1 each    Refill:  2  . amLODipine (NORVASC) 5 MG tablet    Sig: Take 1 tablet (5 mg total) by mouth daily.    Dispense:  30 tablet    Refill:  6    Follow-up: Return in about 6 months (around 05/08/2021) for chronic disease management.       ECharlott Rakes MD, FAAFP. [facility]  11/08/2020, 4:12 PM

## 2020-11-09 LAB — CMP14+EGFR
ALT: 16 IU/L (ref 0–32)
AST: 13 IU/L (ref 0–40)
Albumin/Globulin Ratio: 1.9 (ref 1.2–2.2)
Albumin: 4.3 g/dL (ref 3.8–4.8)
Alkaline Phosphatase: 44 IU/L (ref 44–121)
BUN/Creatinine Ratio: 12 (ref 9–23)
BUN: 9 mg/dL (ref 6–24)
Bilirubin Total: 0.4 mg/dL (ref 0.0–1.2)
CO2: 23 mmol/L (ref 20–29)
Calcium: 9.1 mg/dL (ref 8.7–10.2)
Chloride: 105 mmol/L (ref 96–106)
Creatinine, Ser: 0.75 mg/dL (ref 0.57–1.00)
GFR calc Af Amer: 109 mL/min/{1.73_m2} (ref >59)
GFR calc non Af Amer: 95 mL/min/{1.73_m2} (ref >59)
Globulin, Total: 2.3 g/dL (ref 1.5–4.5)
Glucose: 74 mg/dL (ref 65–99)
Potassium: 3.7 mmol/L (ref 3.5–5.2)
Sodium: 138 mmol/L (ref 134–144)
Total Protein: 6.6 g/dL (ref 6.0–8.5)

## 2020-11-09 LAB — CERVICOVAGINAL ANCILLARY ONLY
Bacterial Vaginitis (gardnerella): NEGATIVE
Candida Glabrata: NEGATIVE
Candida Vaginitis: NEGATIVE
Chlamydia: NEGATIVE
Comment: NEGATIVE
Comment: NEGATIVE
Comment: NEGATIVE
Comment: NEGATIVE
Comment: NEGATIVE
Comment: NORMAL
Neisseria Gonorrhea: NEGATIVE
Trichomonas: NEGATIVE

## 2020-11-09 LAB — HEMOGLOBIN A1C
Est. average glucose Bld gHb Est-mCnc: 108 mg/dL
Hgb A1c MFr Bld: 5.4 % (ref 4.8–5.6)

## 2020-11-11 ENCOUNTER — Other Ambulatory Visit: Payer: 59

## 2020-11-11 DIAGNOSIS — Z20822 Contact with and (suspected) exposure to covid-19: Secondary | ICD-10-CM

## 2020-11-13 LAB — NOVEL CORONAVIRUS, NAA: SARS-CoV-2, NAA: NOT DETECTED

## 2020-11-13 LAB — SARS-COV-2, NAA 2 DAY TAT

## 2020-11-15 MED FILL — FEMYNOR 0.25-35 MG-MCG TABS: 0.25-35 | 28 days supply | Qty: 28 | Fill #0

## 2020-11-15 MED FILL — NYSTATIN 100,000 UNIT/GM PO: 100000 | 15 days supply | Qty: 30 | Fill #0

## 2020-12-05 MED FILL — LISINOPRIL 20 MG TABLET: 20 | 30 days supply | Qty: 30 | Fill #1

## 2020-12-05 MED FILL — AMLODIPINE BESYLATE 5 MG TA: 5 | 30 days supply | Qty: 30 | Fill #1

## 2020-12-12 MED FILL — TOPIRAMATE 50 MG TABLET: 50 | 30 days supply | Qty: 60 | Fill #0

## 2020-12-12 MED FILL — FEMYNOR 0.25-35 MG-MCG TABS: 0.25-35 | 28 days supply | Qty: 28 | Fill #1

## 2020-12-28 MED FILL — SUMAtriptan SUCCINATE 50 MG: 50 | 30 days supply | Qty: 9 | Fill #0

## 2021-01-09 MED FILL — FEMYNOR 0.25-35 MG-MCG TABS: 0.25-35 | 28 days supply | Qty: 28 | Fill #2

## 2021-01-09 MED FILL — LISINOPRIL 20 MG TABLET: 20 | 30 days supply | Qty: 30 | Fill #2

## 2021-01-09 MED FILL — AMLODIPINE BESYLATE 5 MG TA: 5 | 30 days supply | Qty: 30 | Fill #2

## 2021-01-21 ENCOUNTER — Other Ambulatory Visit (HOSPITAL_COMMUNITY): Payer: Self-pay

## 2021-02-06 ENCOUNTER — Other Ambulatory Visit (HOSPITAL_COMMUNITY): Payer: Self-pay

## 2021-02-06 MED FILL — Lisinopril Tab 20 MG: ORAL | 30 days supply | Qty: 30 | Fill #0 | Status: AC

## 2021-02-06 MED FILL — Amlodipine Besylate Tab 5 MG (Base Equivalent): ORAL | 30 days supply | Qty: 30 | Fill #0 | Status: AC

## 2021-02-06 MED FILL — Norgestimate & Ethinyl Estradiol Tab 0.25 MG-35 MCG: ORAL | 28 days supply | Qty: 28 | Fill #0 | Status: AC

## 2021-02-20 ENCOUNTER — Other Ambulatory Visit (HOSPITAL_COMMUNITY): Payer: Self-pay

## 2021-02-20 MED FILL — Topiramate Tab 50 MG: ORAL | 30 days supply | Qty: 60 | Fill #0 | Status: AC

## 2021-03-06 ENCOUNTER — Other Ambulatory Visit (HOSPITAL_COMMUNITY): Payer: Self-pay

## 2021-03-06 MED FILL — Lisinopril Tab 20 MG: ORAL | 30 days supply | Qty: 30 | Fill #1 | Status: AC

## 2021-03-06 MED FILL — Amlodipine Besylate Tab 5 MG (Base Equivalent): ORAL | 30 days supply | Qty: 30 | Fill #1 | Status: AC

## 2021-03-06 MED FILL — Norgestimate & Ethinyl Estradiol Tab 0.25 MG-35 MCG: ORAL | 28 days supply | Qty: 28 | Fill #1 | Status: AC

## 2021-03-09 ENCOUNTER — Ambulatory Visit: Payer: 59 | Attending: Critical Care Medicine

## 2021-03-09 DIAGNOSIS — Z20822 Contact with and (suspected) exposure to covid-19: Secondary | ICD-10-CM

## 2021-03-10 ENCOUNTER — Encounter: Payer: Self-pay | Admitting: Family Medicine

## 2021-03-10 LAB — SARS-COV-2, NAA 2 DAY TAT

## 2021-03-10 LAB — NOVEL CORONAVIRUS, NAA: SARS-CoV-2, NAA: DETECTED — AB

## 2021-03-11 ENCOUNTER — Telehealth: Payer: Self-pay | Admitting: Adult Health

## 2021-03-11 NOTE — Telephone Encounter (Signed)
Called to discuss with patient about COVID-19 symptoms and the use of one of the available treatments for those with mild to moderate Covid symptoms and at a high risk of hospitalization.  Pt appears to qualify for outpatient treatment due to co-morbid conditions and/or a member of an at-risk group in accordance with the FDA Emergency Use Authorization.    Symptom onset: 5/18 Vaccinated: unclear Booster? unclear Immunocompromised? no Qualifiers: 2 NIH Criteria: 1  Unable to reach pt - [place]

## 2021-03-12 ENCOUNTER — Telehealth: Payer: Self-pay | Admitting: Nurse Practitioner

## 2021-03-12 NOTE — Telephone Encounter (Signed)
Called to discuss with patient about COVID-19 symptoms and the use of one of the available treatments for those with mild to moderate Covid symptoms and at a high risk of hospitalization.  Pt appears to qualify for outpatient treatment due to co-morbid conditions and/or a member of an at-risk group in accordance with the FDA Emergency Use Authorization.    Patient states symptoms are resolving. Feels like she still has a mild cold, with lingering cough. She has reached out to her PCP for cough medications.   Declines antiviral.   Barbara Lea, NP [facility] [phone]

## 2021-03-13 ENCOUNTER — Other Ambulatory Visit: Payer: Self-pay | Admitting: Family Medicine

## 2021-03-13 MED ORDER — HYDROCOD POLST-CPM POLST ER 10-8 MG/5ML PO SUER: 5.0000 mL | Freq: Two times a day (BID) | ORAL | 0 refills | Status: DC | PRN

## 2021-03-29 ENCOUNTER — Encounter: Payer: Self-pay | Admitting: Family Medicine

## 2021-04-04 ENCOUNTER — Encounter: Payer: Self-pay | Admitting: Family Medicine

## 2021-04-04 ENCOUNTER — Other Ambulatory Visit (HOSPITAL_COMMUNITY): Payer: Self-pay

## 2021-04-04 ENCOUNTER — Other Ambulatory Visit: Payer: Self-pay

## 2021-04-04 ENCOUNTER — Ambulatory Visit: Payer: 59 | Attending: Family Medicine | Admitting: Family Medicine

## 2021-04-04 VITALS — BP 126/84 | HR 73 | Wt 206.8 lb

## 2021-04-04 DIAGNOSIS — R109 Unspecified abdominal pain: Secondary | ICD-10-CM | POA: Diagnosis not present

## 2021-04-04 DIAGNOSIS — R3129 Other microscopic hematuria: Secondary | ICD-10-CM | POA: Diagnosis not present

## 2021-04-04 LAB — POCT URINALYSIS DIP (CLINITEK)
Bilirubin, UA: NEGATIVE
Glucose, UA: NEGATIVE mg/dL
Ketones, POC UA: NEGATIVE mg/dL
Leukocytes, UA: NEGATIVE
Nitrite, UA: NEGATIVE
POC PROTEIN,UA: NEGATIVE
Spec Grav, UA: 1.025 (ref 1.010–1.025)
Urobilinogen, UA: 0.2 E.U./dL
pH, UA: 7 (ref 5.0–8.0)

## 2021-04-04 MED ORDER — LIDOCAINE 5 % EX PTCH
1.0000 | MEDICATED_PATCH | CUTANEOUS | 0 refills | Status: DC
  Filled 2021-04-04 – 2021-04-05 (×2): qty 30, 30d supply, fill #0

## 2021-04-04 MED ORDER — CYCLOBENZAPRINE HCL 10 MG PO TABS
10.0000 mg | ORAL_TABLET | Freq: Two times a day (BID) | ORAL | 0 refills | Status: DC | PRN
  Filled 2021-04-04: qty 60, 30d supply, fill #0

## 2021-04-04 MED FILL — Norgestimate & Ethinyl Estradiol Tab 0.25 MG-35 MCG: ORAL | 28 days supply | Qty: 28 | Fill #2 | Status: AC

## 2021-04-04 NOTE — Progress Notes (Signed)
Subjective:  Patient ID: Barbara Dunn, female    DOB: [birth year]  Age: 50 y.o. MRN: [id number]  CC: Flank Pain (/)   HPI Barbara Dunn is a 50 year-old female with a history of hypertension, migraine, obesity here for an acute visit.  Interval History: For the last 1-2 months she has had R flank pain which does not radiate, worse at night and when she tosses and turns.  The other day pain was severe that she thought of going to the emergency room. She has urinary frequency which she attributes to drinking lots of water but has no dysuria, pelvic pain, nausea or vomiting. Urinalysis in the clinic revealed hematuria and she admits she is currently on her monthly cycle.  Past Medical History:  Diagnosis Date   Anemia    Arthritis    Bronchitis    Hypertension    Migraines    Obesity     Past Surgical History:  Procedure Laterality Date   CARDIAC CATHETERIZATION N/A 08/02/2015   Procedure: Left Heart Cath and Coronary Angiography;  Surgeon: Troy Sine, MD;  Location: [facility];  Service: Cardiovascular;  Laterality: N/A;   CERVIX LESION DESTRUCTION     TUBAL LIGATION     TUBAL LIGATION      Family History  Problem Relation Age of Onset   Hypertension Mother    Schizophrenia Mother    Schizophrenia Sister    Schizophrenia Brother    Schizophrenia Brother     No Known Allergies  Outpatient Medications Prior to Visit  Medication Sig Dispense Refill   amLODipine (NORVASC) 5 MG tablet TAKE 1 TABLET (5 MG TOTAL) BY MOUTH DAILY. 30 tablet 6   aspirin 81 MG tablet Take 81 mg by mouth daily.     benzonatate (TESSALON) 100 MG capsule TAKE 1 CAPSULE (100 MG TOTAL) BY MOUTH 3 (THREE) TIMES DAILY. 30 capsule 0   BIOTIN PO Take 2 tablets by mouth daily.      cetirizine (ZYRTEC) 10 MG tablet Take 10 mg by mouth daily.     chlorhexidine (PERIDEX) 0.12 % solution Use as directed 5 mLs in the mouth or throat as needed.      chlorpheniramine-HYDROcodone (TUSSIONEX PENNKINETIC  ER) 10-8 MG/5ML SUER Take 5 mLs by mouth every 12 (twelve) hours as needed for cough. 115 mL 0   docusate sodium (COLACE) 100 MG capsule Take 100 mg by mouth as needed.      fluticasone (FLONASE) 50 MCG/ACT nasal spray PLACE 2 SPRAYS INTO BOTH NOSTRILS DAILY. 16 g 2   fluticasone (FLOVENT HFA) 110 MCG/ACT inhaler INHALE 1 PUFF INTO THE LUNGS 2 (TWO) TIMES DAILY. 12 g 2   HYDROcodone-homatropine (HYCODAN) 5-1.5 MG/5ML syrup Take 5 mLs by mouth every 6 (six) hours as needed for cough. 120 mL 0   lisinopril (ZESTRIL) 20 MG tablet TAKE 1 TABLET BY MOUTH DAILY 30 tablet 6   meclizine (ANTIVERT) 25 MG tablet Take 1 tablet (25 mg total) by mouth 3 (three) times daily as needed for dizziness. 60 tablet 1   Multiple Vitamins-Minerals (MULTIVITAMIN ADULT PO) Take 1 tablet by mouth daily.      norgestimate-ethinyl estradiol (ORTHO-CYCLEN) 0.25-35 MG-MCG tablet TAKE 1 TABLET BY MOUTH DAILY. 28 tablet 6   nystatin (MYCOSTATIN/NYSTOP) powder APPLY TOPICALLY 2 TIMES DAILY. 45 g 1   ondansetron (ZOFRAN) 4 MG tablet Take 1 tablet (4 mg total) by mouth every 6 (six) hours. 12 tablet 0   SUMAtriptan (IMITREX) 50 MG tablet  TAKE 2 TABLETS (100 MG TOTAL) BY MOUTH ONCE FOR 1 DOSE. MAY REPEAT IN 2 HOURS IF HEADACHE PERSISTS, MAX 4 TABLETS/DAY 10 tablet 6   topiramate (TOPAMAX) 50 MG tablet Take 1 tablet (50 mg total) by mouth 2 (two) times daily. 60 tablet 6   triamcinolone (KENALOG) 0.1 % APPLY 1 APPLICATION TOPICALLY 2 (TWO) TIMES DAILY. 30 g 0   albuterol (VENTOLIN HFA) 108 (90 Base) MCG/ACT inhaler INHALE 1 PUFF INTO THE LUNGS EVERY 6 HOURS AS NEEDED FOR WHEEZING OR SHORTNESS OF BREATH. 18 g 1   topiramate (TOPAMAX) 50 MG tablet TAKE 1 TABLET (50 MG TOTAL) BY MOUTH 2 (TWO) TIMES DAILY. 60 tablet 3   No facility-administered medications prior to visit.     ROS Review of Systems  Constitutional:  Negative for activity change, appetite change and fatigue.  HENT:  Negative for congestion, sinus pressure and sore  throat.   Eyes:  Negative for visual disturbance.  Respiratory:  Negative for cough, chest tightness, shortness of breath and wheezing.   Cardiovascular:  Negative for chest pain and palpitations.  Gastrointestinal:  Negative for abdominal distention, abdominal pain and constipation.  Endocrine: Negative for polydipsia.  Genitourinary:  Positive for flank pain. Negative for dysuria and frequency.  Musculoskeletal:  Negative for arthralgias and back pain.  Skin:  Negative for rash.  Neurological:  Negative for tremors, light-headedness and numbness.  Hematological:  Does not bruise/bleed easily.  Psychiatric/Behavioral:  Negative for agitation and behavioral problems.    Objective:  BP 126/84   Pulse 73   Wt 206 lb 12.8 oz (93.8 kg)   SpO2 100%   BMI 36.63 kg/m   BP/Weight 04/04/2021 11/08/2020 7/00/1749  Systolic BP 449 675 916  Diastolic BP 84 86 81  Wt. (Lbs) 206.8 207 203  BMI 36.63 36.67 37.13      Physical Exam Constitutional:      Appearance: She is well-developed.  Neck:     Vascular: No JVD.  Cardiovascular:     Rate and Rhythm: Normal rate.     Heart sounds: Normal heart sounds. No murmur heard. Pulmonary:     Effort: Pulmonary effort is normal.     Breath sounds: Normal breath sounds. No wheezing or rales.  Chest:     Chest wall: No tenderness.  Abdominal:     General: Bowel sounds are normal. There is no distension.     Palpations: Abdomen is soft. There is no mass.     Tenderness: There is no abdominal tenderness. There is no right CVA tenderness or left CVA tenderness.  Musculoskeletal:        General: Normal range of motion.     Right lower leg: No edema.     Left lower leg: No edema.     Comments: Slight tenderness on palpation of right side and inferior aspect of posterior rib cage  Neurological:     Mental Status: She is alert and oriented to person, place, and time.  Psychiatric:        Mood and Affect: Mood normal.    CMP Latest Ref Rng &  Units 11/08/2020 12/30/2019 11/02/2019  Glucose 65 - 99 mg/dL 74 82 81  BUN 6 - 24 mg/dL 9 9 6   Creatinine 0.57 - 1.00 mg/dL 0.75 1.00 0.95  Sodium 134 - 144 mmol/L 138 142 137  Potassium 3.5 - 5.2 mmol/L 3.7 4.2 3.7  Chloride 96 - 106 mmol/L 105 109(H) 104  CO2 20 - 29 mmol/L 23 20  21  Calcium 8.7 - 10.2 mg/dL 9.1 9.0 9.0  Total Protein 6.0 - 8.5 g/dL 6.6 6.8 6.6  Total Bilirubin 0.0 - 1.2 mg/dL 0.4 0.3 0.3  Alkaline Phos 44 - 121 IU/L 44 50 58  AST 0 - 40 IU/L 13 18 16   ALT 0 - 32 IU/L 16 15 11     Lipid Panel     Component Value Date/Time   CHOL 184 11/02/2019 1009   TRIG 70 11/02/2019 1009   HDL 86 11/02/2019 1009   CHOLHDL 2.1 11/02/2019 1009   CHOLHDL 1.8 07/27/2016 0917   VLDL 14 07/27/2016 0917   LDLCALC 85 11/02/2019 1009    CBC    Component Value Date/Time   WBC 6.2 02/05/2020 0957   WBC 5.7 01/10/2016 0833   RBC 4.12 02/05/2020 0957   RBC 4.15 01/10/2016 0833   HGB 12.1 02/05/2020 0957   HCT 36.6 02/05/2020 0957   PLT 249 02/05/2020 0957   MCV 89 02/05/2020 0957   MCH 29.4 02/05/2020 0957   MCH 28.9 01/10/2016 0833   MCHC 33.1 02/05/2020 0957   MCHC 32.0 01/10/2016 0833   RDW 12.0 02/05/2020 0957   LYMPHSABS 2.3 02/05/2020 0957   MONOABS 0.7 10/19/2015 1008   EOSABS 0.2 02/05/2020 0957   BASOSABS 0.1 02/05/2020 0957    Lab Results  Component Value Date   HGBA1C 5.4 11/08/2020    Assessment & Plan:  1. Flank pain Urinalysis negative for UTI Symptoms with low suspicion for renal calculi Will treat as muscle spasm If symptoms persist consider imaging - POCT URINALYSIS DIP (CLINITEK) - lidocaine (LIDODERM) 5 %; Place 1 patch onto the skin daily. Remove & Discard patch within 12 hours or as directed by MD  Dispense: 30 patch; Refill: 0 - cyclobenzaprine (FLEXERIL) 10 MG tablet; Take 1 tablet (10 mg total) by mouth 2 (two) times daily as needed for muscle spasms.  Dispense: 60 tablet; Refill: 0  2. Other microscopic hematuria Secondary to ongoing  monthly cycle  Meds ordered this encounter  Medications   lidocaine (LIDODERM) 5 %    Sig: Place 1 patch onto the skin daily. Remove & Discard patch within 12 hours or as directed by MD    Dispense:  30 patch    Refill:  0   cyclobenzaprine (FLEXERIL) 10 MG tablet    Sig: Take 1 tablet (10 mg total) by mouth 2 (two) times daily as needed for muscle spasms.    Dispense:  60 tablet    Refill:  0   Return if symptoms worsen or fail to improve, for Medical conditions, keep previously scheduled appointment.       Charlott Rakes, MD, FAAFP. [facility], [place]   04/04/2021, 5:19 PM

## 2021-04-04 NOTE — Patient Instructions (Signed)
Flank Pain, Adult Flank pain is pain in your side. The flank is the area of your side between your upper belly (abdomen) and your back. The pain may occur over a short time (acute), or it may be long-term or come back often (chronic). It may be mild or very bad. Pain in this area can be caused by manydifferent things. Follow these instructions at home:  Drink enough fluid to keep your pee (urine) clear or pale yellow. Rest as told by your doctor. Take over-the-counter and prescription medicines only as told by your doctor. Keep a journal to keep track of: What has caused your flank pain. What has made it feel better. Keep all follow-up visits as told by your doctor. This is important. Contact a doctor if: Medicine does not help your pain. You have new symptoms. Your pain gets worse. You have a fever. Your symptoms last longer than 2-3 days. You have trouble peeing. You are peeing more often than normal. Get help right away if: You have trouble breathing. You are short of breath. Your belly hurts, or it is swollen or red. You feel sick to your stomach (nauseous). You throw up (vomit). You feel like you will pass out, or you do pass out (faint). You have blood in your pee. Summary Flank pain is pain in your side. The flank is the area of your side between your upper belly (abdomen) and your back. Flank pain may occur over a short time (acute), or it may be long-term or come back often (chronic). It may be mild or very bad. Pain in this area can be caused by many different things. Contact your doctor if your symptoms get worse or they last longer than 2-3 days. This information is not intended to replace advice given to you by your health care provider. Make sure you discuss any questions you have with your healthcare provider. Document Revised: 06/28/2020 Document Reviewed: 07/01/2020 Elsevier Patient Education  2022 [employer].

## 2021-04-05 ENCOUNTER — Telehealth: Payer: Self-pay

## 2021-04-05 ENCOUNTER — Other Ambulatory Visit (HOSPITAL_COMMUNITY): Payer: Self-pay

## 2021-04-05 ENCOUNTER — Other Ambulatory Visit: Payer: Self-pay

## 2021-04-05 ENCOUNTER — Encounter: Payer: Self-pay | Admitting: Family Medicine

## 2021-04-05 NOTE — Telephone Encounter (Signed)
Spoke to [facility], they stated they would put a note with her prescriptions for pick up to get OTC Lidocaine 4% and will offer to her to order them if needed.  PA denied, not chronic diagnosis.

## 2021-04-14 ENCOUNTER — Other Ambulatory Visit (HOSPITAL_COMMUNITY): Payer: Self-pay

## 2021-04-14 MED FILL — Amlodipine Besylate Tab 5 MG (Base Equivalent): ORAL | 30 days supply | Qty: 30 | Fill #2 | Status: AC

## 2021-04-14 MED FILL — Lisinopril Tab 20 MG: ORAL | 30 days supply | Qty: 30 | Fill #2 | Status: AC

## 2021-04-25 ENCOUNTER — Other Ambulatory Visit (HOSPITAL_COMMUNITY): Payer: Self-pay

## 2021-04-25 MED FILL — Topiramate Tab 50 MG: ORAL | 30 days supply | Qty: 60 | Fill #1 | Status: AC

## 2021-05-02 ENCOUNTER — Other Ambulatory Visit (HOSPITAL_COMMUNITY): Payer: Self-pay

## 2021-05-02 MED FILL — Norgestimate & Ethinyl Estradiol Tab 0.25 MG-35 MCG: ORAL | 28 days supply | Qty: 28 | Fill #3 | Status: AC

## 2021-05-11 ENCOUNTER — Other Ambulatory Visit (HOSPITAL_COMMUNITY): Payer: Self-pay

## 2021-05-11 ENCOUNTER — Other Ambulatory Visit: Payer: Self-pay | Admitting: Family Medicine

## 2021-05-11 MED ORDER — TRIAMCINOLONE ACETONIDE 0.1 % EX CREA
TOPICAL_CREAM | Freq: Two times a day (BID) | CUTANEOUS | 0 refills | Status: DC
  Filled 2021-05-11: qty 30, 15d supply, fill #0

## 2021-05-11 MED FILL — Lisinopril Tab 20 MG: ORAL | 30 days supply | Qty: 30 | Fill #3 | Status: AC

## 2021-05-11 MED FILL — Sumatriptan Succinate Tab 50 MG: ORAL | 30 days supply | Qty: 9 | Fill #0 | Status: AC

## 2021-05-11 MED FILL — Amlodipine Besylate Tab 5 MG (Base Equivalent): ORAL | 30 days supply | Qty: 30 | Fill #3 | Status: AC

## 2021-05-12 ENCOUNTER — Other Ambulatory Visit (HOSPITAL_COMMUNITY): Payer: Self-pay

## 2021-05-30 ENCOUNTER — Other Ambulatory Visit: Payer: Self-pay | Admitting: Family Medicine

## 2021-05-30 ENCOUNTER — Other Ambulatory Visit (HOSPITAL_COMMUNITY): Payer: Self-pay

## 2021-05-30 DIAGNOSIS — Z3041 Encounter for surveillance of contraceptive pills: Secondary | ICD-10-CM

## 2021-05-30 MED ORDER — NORGESTIMATE-ETH ESTRADIOL 0.25-35 MG-MCG PO TABS
1.0000 | ORAL_TABLET | Freq: Every day | ORAL | 6 refills | Status: DC
  Filled 2021-05-30: qty 28, 28d supply, fill #0
  Filled 2021-06-27: qty 84, 84d supply, fill #1
  Filled 2021-09-19: qty 84, 84d supply, fill #2

## 2021-05-31 ENCOUNTER — Other Ambulatory Visit (HOSPITAL_COMMUNITY): Payer: Self-pay

## 2021-06-08 ENCOUNTER — Other Ambulatory Visit (HOSPITAL_COMMUNITY): Payer: Self-pay

## 2021-06-08 ENCOUNTER — Other Ambulatory Visit: Payer: Self-pay | Admitting: Family Medicine

## 2021-06-08 DIAGNOSIS — I10 Essential (primary) hypertension: Secondary | ICD-10-CM

## 2021-06-08 MED ORDER — AMLODIPINE BESYLATE 5 MG PO TABS
5.0000 mg | ORAL_TABLET | Freq: Every day | ORAL | 3 refills | Status: DC
  Filled 2021-06-08: qty 30, 30d supply, fill #0
  Filled 2021-07-14: qty 30, 30d supply, fill #1
  Filled 2021-08-14: qty 30, 30d supply, fill #2
  Filled 2021-09-07: qty 30, 30d supply, fill #3

## 2021-06-08 MED ORDER — LISINOPRIL 20 MG PO TABS
20.0000 mg | ORAL_TABLET | Freq: Every day | ORAL | 3 refills | Status: DC
  Filled 2021-06-08: qty 30, 30d supply, fill #0
  Filled 2021-07-14: qty 30, 30d supply, fill #1
  Filled 2021-08-14: qty 30, 30d supply, fill #2
  Filled 2021-09-07: qty 30, 30d supply, fill #3

## 2021-06-27 ENCOUNTER — Other Ambulatory Visit (HOSPITAL_COMMUNITY): Payer: Self-pay

## 2021-06-27 MED FILL — Topiramate Tab 50 MG: ORAL | 30 days supply | Qty: 60 | Fill #2 | Status: AC

## 2021-07-14 ENCOUNTER — Other Ambulatory Visit (HOSPITAL_COMMUNITY): Payer: Self-pay

## 2021-07-19 ENCOUNTER — Encounter: Payer: Self-pay | Admitting: Physician Assistant

## 2021-07-19 ENCOUNTER — Other Ambulatory Visit (HOSPITAL_COMMUNITY): Payer: Self-pay

## 2021-07-19 ENCOUNTER — Ambulatory Visit: Payer: 59 | Attending: Physician Assistant | Admitting: Physician Assistant

## 2021-07-19 ENCOUNTER — Other Ambulatory Visit: Payer: Self-pay

## 2021-07-19 ENCOUNTER — Ambulatory Visit (HOSPITAL_COMMUNITY)
Admission: RE | Admit: 2021-07-19 | Discharge: 2021-07-19 | Disposition: A | Discharge status: Home / Self Care | Payer: 59 | Source: Ambulatory Visit | Attending: Physician Assistant | Admitting: Physician Assistant

## 2021-07-19 VITALS — BP 114/88 | HR 88 | Resp 16 | Wt 215.0 lb

## 2021-07-19 DIAGNOSIS — R109 Unspecified abdominal pain: Secondary | ICD-10-CM

## 2021-07-19 DIAGNOSIS — R10A0 Flank pain, unspecified side: Secondary | ICD-10-CM

## 2021-07-19 DIAGNOSIS — I10 Essential (primary) hypertension: Secondary | ICD-10-CM

## 2021-07-19 LAB — POCT URINALYSIS DIP (CLINITEK)
Bilirubin, UA: NEGATIVE
Blood, UA: NEGATIVE
Glucose, UA: NEGATIVE mg/dL
Ketones, POC UA: NEGATIVE mg/dL
Leukocytes, UA: NEGATIVE
Nitrite, UA: NEGATIVE
POC PROTEIN,UA: NEGATIVE
Spec Grav, UA: 1.02 (ref 1.010–1.025)
Urobilinogen, UA: 2 E.U./dL — AB
pH, UA: 7 (ref 5.0–8.0)

## 2021-07-19 MED ORDER — GABAPENTIN 300 MG PO CAPS
300.0000 mg | ORAL_CAPSULE | Freq: Every day | ORAL | 0 refills | Status: DC
  Filled 2021-07-19: qty 90, 90d supply, fill #0

## 2021-07-19 NOTE — Patient Instructions (Addendum)
Drink 80-100 ounces water daily    Flank Pain, Adult Flank pain is pain in your side. The flank is the area on your side between your upper belly (abdomen) and your spine. The pain may occur over a short time (acute), or it may be long-term or come back often (chronic). It may be mild or very bad. Pain in this area can be caused by many different things. Follow these instructions at home:  Drink enough fluid to keep your pee (urine) pale yellow. Rest as told by your doctor. Take over-the-counter and prescription medicines only as told by your doctor. Keep a journal to keep track of: What has caused your flank pain. What has made your flank pain feel better. Keep all follow-up visits. Contact a doctor if: Medicine does not help your pain. You have new symptoms. Your pain gets worse. Your symptoms last longer than 2-3 days. You have trouble peeing. You are peeing more often than normal. Get help right away if: You have trouble breathing. You are short of breath. Your belly hurts, or it is swollen or red. You feel like you may vomit (nauseous). You vomit. You feel faint, or you faint. You have blood in your pee. You have flank pain and a fever. These symptoms may be an emergency. Get help right away. Call your local emergency services (911 in the [place] Do not wait to see if the symptoms will go away. Do not drive yourself to the hospital. Summary Flank pain is pain in your side. The flank is the area of your side between your upper belly (abdomen) and your spine. Flank pain may occur over a short time (acute), or it may be long-term or come back often (chronic). It may be mild or very bad. Pain in this area can be caused by many different things. Contact your doctor if your symptoms get worse or last longer than 2-3 days. This information is not intended to replace advice given to you by your health care provider. Make sure you discuss any questions you have with your health care  provider. Document Revised: 12/19/2020 Document Reviewed: 12/19/2020 Elsevier Patient Education  2022 [employer].

## 2021-07-19 NOTE — Progress Notes (Signed)
Patient ID: Barbara Dunn, female   DOB: Aug 03, 1971, 50 y.o.   MRN: [id number]   Elvena Oyer, is a 50 y.o. female  ZYS:[id number]  [id number]  DOB - [birth year]  Chief Complaint  Patient presents with   Flank Pain       Subjective:   Barbara Dunn is a 50 y.o. female here today for R flank pain for about 5-6 months.  No h/o kidney stones.  She has urinary frequency without pain.  Does not drink much water.  The pain is constant but waxes and wanes in tensity.  Usu pain is worse at night.  Failed NSAIDS, flexeril, and lidocaine patch.  Pain is on the R side of her back and wraps around to the front at times.  She took some of her sister's gabapentin that helped some  No problems updated.  ALLERGIES: No Known Allergies  PAST MEDICAL HISTORY: Past Medical History:  Diagnosis Date   Anemia    Arthritis    Bronchitis    Hypertension    Migraines    Obesity     MEDICATIONS AT HOME: Prior to Admission medications   Medication Sig Start Date End Date Taking? Authorizing Provider  gabapentin (NEURONTIN) 300 MG capsule Take 1 capsule (300 mg total) by mouth at bedtime. 07/19/21  Yes Darris Staiger M, PA-C  albuterol (VENTOLIN HFA) 108 (90 Base) MCG/ACT inhaler INHALE 1 PUFF INTO THE LUNGS EVERY 6 HOURS AS NEEDED FOR WHEEZING OR SHORTNESS OF BREATH. 02/01/20 01/31/21  Charlott Rakes, MD  amLODipine (NORVASC) 5 MG tablet TAKE 1 TABLET (5 MG TOTAL) BY MOUTH DAILY. 06/08/21   Charlott Rakes, MD  aspirin 81 MG tablet Take 81 mg by mouth daily.    [provider]  benzonatate (TESSALON) 100 MG capsule TAKE 1 CAPSULE (100 MG TOTAL) BY MOUTH 3 (THREE) TIMES DAILY. 01/11/20   Charlott Rakes, MD  BIOTIN PO Take 2 tablets by mouth daily.     [provider]  cetirizine (ZYRTEC) 10 MG tablet Take 10 mg by mouth daily.    [provider]  chlorhexidine (PERIDEX) 0.12 % solution Use as directed 5 mLs in the mouth or throat as needed.     [provider]   chlorpheniramine-HYDROcodone (TUSSIONEX PENNKINETIC ER) 10-8 MG/5ML SUER Take 5 mLs by mouth every 12 (twelve) hours as needed for cough. 03/13/21   Charlott Rakes, MD  cyclobenzaprine (FLEXERIL) 10 MG tablet Take 1 tablet (10 mg total) by mouth 2 (two) times daily as needed for muscle spasms. 04/04/21   Charlott Rakes, MD  docusate sodium (COLACE) 100 MG capsule Take 100 mg by mouth as needed.     [provider]  fluticasone (FLONASE) 50 MCG/ACT nasal spray PLACE 2 SPRAYS INTO BOTH NOSTRILS DAILY. 01/11/20   Charlott Rakes, MD  fluticasone (FLOVENT HFA) 110 MCG/ACT inhaler INHALE 1 PUFF INTO THE LUNGS 2 (TWO) TIMES DAILY. 11/08/20 11/08/21  Charlott Rakes, MD  HYDROcodone-homatropine (HYCODAN) 5-1.5 MG/5ML syrup Take 5 mLs by mouth every 6 (six) hours as needed for cough. 12/15/19   Vanessa Kick, MD  lidocaine (LIDODERM) 5 % Place 1 patch onto the skin daily. Remove & Discard patch within 12 hours or as directed by MD 04/04/21   Charlott Rakes, MD  lisinopril (ZESTRIL) 20 MG tablet TAKE 1 TABLET BY MOUTH DAILY 06/08/21   Charlott Rakes, MD  meclizine (ANTIVERT) 25 MG tablet Take 1 tablet (25 mg total) by mouth 3 (three) times daily as needed for dizziness. 07/10/18  Charlott Rakes, MD  Multiple Vitamins-Minerals (MULTIVITAMIN ADULT PO) Take 1 tablet by mouth daily.     [provider]  norgestimate-ethinyl estradiol [facility]) 0.25-35 MG-MCG tablet TAKE 1 TABLET BY MOUTH DAILY. 05/30/21   Charlott Rakes, MD  nystatin (MYCOSTATIN/NYSTOP) powder APPLY TOPICALLY 2 TIMES DAILY. 11/08/20 11/08/21  Charlott Rakes, MD  ondansetron (ZOFRAN) 4 MG tablet Take 1 tablet (4 mg total) by mouth every 6 (six) hours. 12/07/19   Raylene Everts, MD  SUMAtriptan (IMITREX) 50 MG tablet TAKE 2 TABLETS (100 MG TOTAL) BY MOUTH ONCE FOR 1 DOSE. MAY REPEAT IN 2 HOURS IF HEADACHE PERSISTS, MAX 4 TABLETS/DAY 11/08/20 11/08/21  Charlott Rakes, MD  topiramate (TOPAMAX) 50 MG tablet Take 1 tablet (50 mg total)  by mouth 2 (two) times daily. 11/08/20   Charlott Rakes, MD  topiramate (TOPAMAX) 50 MG tablet TAKE 1 TABLET (50 MG TOTAL) BY MOUTH 2 (TWO) TIMES DAILY. 02/01/20 01/31/21  Charlott Rakes, MD  triamcinolone cream (KENALOG) 0.1 % APPLY 1 APPLICATION TOPICALLY 2 (TWO) TIMES DAILY. 05/11/21 05/11/22  Charlott Rakes, MD  diphenhydrAMINE (BENADRYL) 25 mg capsule Take 25 mg by mouth every 6 (six) hours as needed for itching. Reported on 01/23/2016  12/07/19  [provider]    ROS: Neg HEENT Neg resp Neg cardiac Neg GI Neg MS Neg psych Neg neuro  Objective:   Vitals:   07/19/21 0838  BP: 114/88  Pulse: 88  Resp: 16  SpO2: 100%  Weight: 215 lb (97.5 kg)   Exam General appearance : Awake, alert, not in any distress. Speech Clear. Not toxic looking HEENT: Atraumatic and Normocephalic Neck: Supple, no JVD. No cervical lymphadenopathy.  Chest: Good air entry bilaterally, CTAB.  No rales/rhonchi/wheezing CVS: S1 S2 regular, no murmurs.  Abdomen: Bowel sounds present, Non tender and not distended with no gaurding, rigidity or rebound. Extremities: B/L Lower Ext shows no edema, both legs are warm to touch Neurology: Awake alert, and oriented X 3, CN II-XII intact, Non focal Skin: No Rash  Data Review Lab Results  Component Value Date   HGBA1C 5.4 11/08/2020   HGBA1C 5.3 01/01/2018   HGBA1C 5.3 02/11/2015    Assessment & Plan   1. Flank pain Suspect musculoslkeletal - Comprehensive metabolic panel - CBC with Differential/Platelet - POCT URINALYSIS DIP (CLINITEK) - DG Abd 1 View; Future - gabapentin (NEURONTIN) 300 MG capsule; Take 1 capsule (300 mg total) by mouth at bedtime.  Dispense: 90 capsule; Refill: 0  2. Essential hypertension Controlled-continue current regimen - Comprehensive metabolic panel - CBC with Differential/Platelet    Patient have been counseled extensively about nutrition and exercise. Other issues discussed during this visit include: low  cholesterol diet, weight control and daily exercise, foot care, annual eye examinations at Ophthalmology, importance of adherence with medications and regular follow-up. We also discussed long term complications of uncontrolled diabetes and hypertension.   Return for PCP in december for pap.  The patient was given clear instructions to go to ER or return to medical [facility] if symptoms don't improve, worsen or new problems develop. The patient verbalized understanding. The patient was told to call to get lab results if they haven't heard anything in the next week.      Freeman Caldron, PA-C [facility], [place]   07/19/2021, 8:55 AM

## 2021-07-20 LAB — CBC WITH DIFFERENTIAL/PLATELET
Basophils Absolute: 0.1 10*3/uL (ref 0.0–0.2)
Basos: 1 %
EOS (ABSOLUTE): 0.3 10*3/uL (ref 0.0–0.4)
Eos: 4 %
Hematocrit: 39.2 % (ref 34.0–46.6)
Hemoglobin: 13 g/dL (ref 11.1–15.9)
Immature Grans (Abs): 0 10*3/uL (ref 0.0–0.1)
Immature Granulocytes: 0 %
Lymphocytes Absolute: 2.7 10*3/uL (ref 0.7–3.1)
Lymphs: 37 %
MCH: 29.7 pg (ref 26.6–33.0)
MCHC: 33.2 g/dL (ref 31.5–35.7)
MCV: 90 fL (ref 79–97)
Monocytes Absolute: 0.7 10*3/uL (ref 0.1–0.9)
Monocytes: 10 %
Neutrophils Absolute: 3.5 10*3/uL (ref 1.4–7.0)
Neutrophils: 48 %
Platelets: 228 10*3/uL (ref 150–450)
RBC: 4.37 x10E6/uL (ref 3.77–5.28)
RDW: 11.6 % — ABNORMAL LOW (ref 11.7–15.4)
WBC: 7.3 10*3/uL (ref 3.4–10.8)

## 2021-07-20 LAB — COMPREHENSIVE METABOLIC PANEL
ALT: 9 IU/L (ref 0–32)
AST: 16 IU/L (ref 0–40)
Albumin/Globulin Ratio: 1.5 (ref 1.2–2.2)
Albumin: 4.2 g/dL (ref 3.8–4.8)
Alkaline Phosphatase: 51 IU/L (ref 44–121)
BUN/Creatinine Ratio: 9 (ref 9–23)
BUN: 9 mg/dL (ref 6–24)
Bilirubin Total: 0.3 mg/dL (ref 0.0–1.2)
CO2: 21 mmol/L (ref 20–29)
Calcium: 8.6 mg/dL — ABNORMAL LOW (ref 8.7–10.2)
Chloride: 106 mmol/L (ref 96–106)
Creatinine, Ser: 0.98 mg/dL (ref 0.57–1.00)
Globulin, Total: 2.8 g/dL (ref 1.5–4.5)
Glucose: 85 mg/dL (ref 70–99)
Potassium: 4 mmol/L (ref 3.5–5.2)
Sodium: 140 mmol/L (ref 134–144)
Total Protein: 7 g/dL (ref 6.0–8.5)
eGFR: 71 mL/min/{1.73_m2} (ref >59)

## 2021-08-14 ENCOUNTER — Other Ambulatory Visit (HOSPITAL_COMMUNITY): Payer: Self-pay

## 2021-09-07 ENCOUNTER — Other Ambulatory Visit (HOSPITAL_COMMUNITY): Payer: Self-pay

## 2021-09-07 MED FILL — Topiramate Tab 50 MG: ORAL | 30 days supply | Qty: 60 | Fill #3 | Status: AC

## 2021-09-19 ENCOUNTER — Other Ambulatory Visit (HOSPITAL_COMMUNITY): Payer: Self-pay

## 2021-09-27 ENCOUNTER — Other Ambulatory Visit: Payer: Self-pay

## 2021-09-27 ENCOUNTER — Ambulatory Visit: Payer: 59 | Admitting: Family Medicine

## 2021-10-17 ENCOUNTER — Other Ambulatory Visit (HOSPITAL_COMMUNITY): Payer: Self-pay

## 2021-10-17 ENCOUNTER — Other Ambulatory Visit: Payer: Self-pay | Admitting: Family Medicine

## 2021-10-17 DIAGNOSIS — I10 Essential (primary) hypertension: Secondary | ICD-10-CM

## 2021-10-17 MED ORDER — LISINOPRIL 20 MG PO TABS
20.0000 mg | ORAL_TABLET | Freq: Every day | ORAL | 0 refills | Status: DC
  Filled 2021-10-17: qty 30, 30d supply, fill #0

## 2021-10-17 MED ORDER — AMLODIPINE BESYLATE 5 MG PO TABS
5.0000 mg | ORAL_TABLET | Freq: Every day | ORAL | 0 refills | Status: DC
  Filled 2021-10-17: qty 30, 30d supply, fill #0

## 2021-10-19 NOTE — Progress Notes (Signed)
Erroneous encounter

## 2021-10-30 ENCOUNTER — Other Ambulatory Visit (HOSPITAL_COMMUNITY): Payer: Self-pay

## 2021-10-31 ENCOUNTER — Ambulatory Visit: Payer: 59 | Attending: Family Medicine | Admitting: Family Medicine

## 2021-10-31 ENCOUNTER — Other Ambulatory Visit (HOSPITAL_COMMUNITY): Payer: Self-pay

## 2021-10-31 ENCOUNTER — Telehealth: Payer: Self-pay

## 2021-10-31 DIAGNOSIS — G43009 Migraine without aura, not intractable, without status migrainosus: Secondary | ICD-10-CM

## 2021-10-31 DIAGNOSIS — R1012 Left upper quadrant pain: Secondary | ICD-10-CM

## 2021-10-31 DIAGNOSIS — Z131 Encounter for screening for diabetes mellitus: Secondary | ICD-10-CM

## 2021-10-31 DIAGNOSIS — L84 Corns and callosities: Secondary | ICD-10-CM

## 2021-10-31 DIAGNOSIS — I10 Essential (primary) hypertension: Secondary | ICD-10-CM

## 2021-10-31 DIAGNOSIS — J328 Other chronic sinusitis: Secondary | ICD-10-CM

## 2021-10-31 MED ORDER — AMLODIPINE BESYLATE 5 MG PO TABS
5.0000 mg | ORAL_TABLET | Freq: Every day | ORAL | 6 refills | Status: DC
  Filled 2021-10-31 – 2021-11-13 (×2): qty 30, 30d supply, fill #0
  Filled 2021-12-14: qty 30, 30d supply, fill #1
  Filled 2022-01-08: qty 30, 30d supply, fill #2
  Filled 2022-02-16: qty 30, 30d supply, fill #3
  Filled 2022-03-20: qty 30, 30d supply, fill #4
  Filled 2022-04-17: qty 30, 30d supply, fill #5

## 2021-10-31 MED ORDER — LISINOPRIL 20 MG PO TABS
20.0000 mg | ORAL_TABLET | Freq: Every day | ORAL | 6 refills | Status: DC
  Filled 2021-10-31 – 2021-11-13 (×2): qty 30, 30d supply, fill #0
  Filled 2021-12-14: qty 30, 30d supply, fill #1
  Filled 2022-01-08: qty 30, 30d supply, fill #2
  Filled 2022-02-16: qty 30, 30d supply, fill #3
  Filled 2022-03-20: qty 30, 30d supply, fill #4
  Filled 2022-04-17: qty 30, 30d supply, fill #5

## 2021-10-31 MED ORDER — TOPIRAMATE 50 MG PO TABS
50.0000 mg | ORAL_TABLET | Freq: Two times a day (BID) | ORAL | 6 refills | Status: DC
  Filled 2021-10-31 – 2022-01-08 (×2): qty 60, 30d supply, fill #0
  Filled 2022-03-20: qty 60, 30d supply, fill #1
  Filled 2022-07-16: qty 60, 30d supply, fill #2
  Filled 2022-09-14: qty 60, 30d supply, fill #3

## 2021-10-31 MED ORDER — SUMATRIPTAN SUCCINATE 50 MG PO TABS
ORAL_TABLET | ORAL | 6 refills | Status: DC
  Filled 2021-10-31: qty 10, 30d supply, fill #0
  Filled 2022-09-26: qty 10, 30d supply, fill #1

## 2021-10-31 MED ORDER — OXYMETAZOLINE HCL 0.05 % NA SOLN
1.0000 | Freq: Two times a day (BID) | NASAL | 0 refills | Status: AC
  Filled 2021-10-31: qty 30, 150d supply, fill #0

## 2021-10-31 MED ORDER — TOPIRAMATE 50 MG PO TABS
50.0000 mg | ORAL_TABLET | Freq: Two times a day (BID) | ORAL | 6 refills | Status: DC
  Filled 2021-10-31: qty 60, 30d supply, fill #0
  Filled 2022-05-17: qty 60, 30d supply, fill #1

## 2021-10-31 NOTE — Telephone Encounter (Signed)
Pt has been scheduled for lab appointment.

## 2021-10-31 NOTE — Progress Notes (Signed)
Virtual Visit via Telephone Note  I connected with [name], on 10/31/2021 at 2:13 PM by telephone due to the COVID-19 pandemic and verified that I am speaking with the correct person using two identifiers.   Consent: I discussed the limitations, risks, security and privacy concerns of performing an evaluation and management service by telephone and the availability of in person appointments. I also discussed with the patient that there may be a patient responsible charge related to this service. The patient expressed understanding and agreed to proceed.   Location of Patient: Environmental education officer of Provider: [facility]   Persons participating in Telemedicine visit: Adelaide Bufano Dr. Margarita Rana     History of Present Illness: Demoni Parmar is a 51 y.o. year old female with a history of hypertension, migraine, obesity seen for an office visit.    Her R flank is still hurting and not improved with her muscle relaxants and gabapentin.  Pain has been present for the last 6 months Pain is worse when she lies on her left side. Abdominal x-ray from 06/2021 revealed: FINDINGS: Multiple calcifications are seen within the right hemipelvis most in keeping with multiple phleboliths. No definite nephro or urolithiasis. Normal abdominal gas pattern. No organomegaly. No acute bone abnormality.   IMPRESSION: No definite nephro or urolithiasis.      Everytime she blows her nose she has blood which is dark and every now and then when coughs she has blood in it.  She is on Flonase which she obtained OTC and uses a humidifier at home.  Denies presence of sinus pressure, headaches. She has a corn on her L foot which she would like evaluated.  Past Medical History:  Diagnosis Date   Anemia    Arthritis    Bronchitis    Hypertension    Migraines    Obesity    No Known Allergies  Current Outpatient Medications on File Prior to Visit  Medication Sig Dispense Refill   albuterol (VENTOLIN HFA) 108 (90  Base) MCG/ACT inhaler INHALE 1 PUFF INTO THE LUNGS EVERY 6 HOURS AS NEEDED FOR WHEEZING OR SHORTNESS OF BREATH. 18 g 1   amLODipine (NORVASC) 5 MG tablet Take 1 tablet (5 mg total) by mouth daily. 30 tablet 0   aspirin 81 MG tablet Take 81 mg by mouth daily.     benzonatate (TESSALON) 100 MG capsule TAKE 1 CAPSULE (100 MG TOTAL) BY MOUTH 3 (THREE) TIMES DAILY. 30 capsule 0   BIOTIN PO Take 2 tablets by mouth daily.      cetirizine (ZYRTEC) 10 MG tablet Take 10 mg by mouth daily.     chlorhexidine (PERIDEX) 0.12 % solution Use as directed 5 mLs in the mouth or throat as needed.  (Patient not taking: Reported on 09/27/2021)     chlorpheniramine-HYDROcodone (TUSSIONEX PENNKINETIC ER) 10-8 MG/5ML SUER Take 5 mLs by mouth every 12 (twelve) hours as needed for cough. 115 mL 0   cyclobenzaprine (FLEXERIL) 10 MG tablet Take 1 tablet (10 mg total) by mouth 2 (two) times daily as needed for muscle spasms. (Patient not taking: Reported on 09/27/2021) 60 tablet 0   docusate sodium (COLACE) 100 MG capsule Take 100 mg by mouth as needed.      fluticasone (FLONASE) 50 MCG/ACT nasal spray PLACE 2 SPRAYS INTO BOTH NOSTRILS DAILY. 16 g 2   fluticasone (FLOVENT HFA) 110 MCG/ACT inhaler INHALE 1 PUFF INTO THE LUNGS 2 (TWO) TIMES DAILY. 12 g 2   gabapentin (NEURONTIN) 300 MG capsule  Take 1 capsule (300 mg total) by mouth at bedtime. 90 capsule 0   HYDROcodone-homatropine (HYCODAN) 5-1.5 MG/5ML syrup Take 5 mLs by mouth every 6 (six) hours as needed for cough. (Patient not taking: Reported on 09/27/2021) 120 mL 0   lidocaine (LIDODERM) 5 % Place 1 patch onto the skin daily. Remove & Discard patch within 12 hours or as directed by MD (Patient not taking: Reported on 09/27/2021) 30 patch 0   lisinopril (ZESTRIL) 20 MG tablet Take 1 tablet (20 mg total) by mouth daily. 30 tablet 0   meclizine (ANTIVERT) 25 MG tablet Take 1 tablet (25 mg total) by mouth 3 (three) times daily as needed for dizziness. 60 tablet 1   Multiple  Vitamins-Minerals (MULTIVITAMIN ADULT PO) Take 1 tablet by mouth daily.      norgestimate-ethinyl estradiol (FEMYNOR) 0.25-35 MG-MCG tablet TAKE 1 TABLET BY MOUTH DAILY. 28 tablet 6   nystatin (MYCOSTATIN/NYSTOP) powder APPLY TOPICALLY 2 TIMES DAILY. 45 g 1   ondansetron (ZOFRAN) 4 MG tablet Take 1 tablet (4 mg total) by mouth every 6 (six) hours. 12 tablet 0   SUMAtriptan (IMITREX) 50 MG tablet TAKE 2 TABLETS (100 MG TOTAL) BY MOUTH ONCE FOR 1 DOSE. MAY REPEAT IN 2 HOURS IF HEADACHE PERSISTS, MAX 4 TABLETS/DAY 10 tablet 6   topiramate (TOPAMAX) 50 MG tablet Take 1 tablet (50 mg total) by mouth 2 (two) times daily. (Patient not taking: Reported on 09/27/2021) 60 tablet 6   topiramate (TOPAMAX) 50 MG tablet TAKE 1 TABLET (50 MG TOTAL) BY MOUTH 2 (TWO) TIMES DAILY. 60 tablet 3   triamcinolone cream (KENALOG) 0.1 % APPLY 1 APPLICATION TOPICALLY 2 (TWO) TIMES DAILY. 30 g 0   [DISCONTINUED] diphenhydrAMINE (BENADRYL) 25 mg capsule Take 25 mg by mouth every 6 (six) hours as needed for itching. Reported on 01/23/2016     No current facility-administered medications on file prior to visit.    ROS: See HPI  Observations/Objective: Awake, alert, oriented x3 Not in acute distress Normal mood   CMP Latest Ref Rng & Units 07/19/2021 11/08/2020 12/30/2019  Glucose 70 - 99 mg/dL 85 74 82  BUN 6 - 24 mg/dL 9 9 9   Creatinine 0.57 - 1.00 mg/dL 0.98 0.75 1.00  Sodium 134 - 144 mmol/L 140 138 142  Potassium 3.5 - 5.2 mmol/L 4.0 3.7 4.2  Chloride 96 - 106 mmol/L 106 105 109(H)  CO2 20 - 29 mmol/L 21 23 20   Calcium 8.7 - 10.2 mg/dL 8.6(L) 9.1 9.0  Total Protein 6.0 - 8.5 g/dL 7.0 6.6 6.8  Total Bilirubin 0.0 - 1.2 mg/dL 0.3 0.4 0.3  Alkaline Phos 44 - 121 IU/L 51 44 50  AST 0 - 40 IU/L 16 13 18   ALT 0 - 32 IU/L 9 16 15     Lipid Panel     Component Value Date/Time   CHOL 184 11/02/2019 1009   TRIG 70 11/02/2019 1009   HDL 86 11/02/2019 1009   CHOLHDL 2.1 11/02/2019 1009   CHOLHDL 1.8 07/27/2016 0917    VLDL 14 07/27/2016 0917   LDLCALC 85 11/02/2019 1009   LABVLDL 13 11/02/2019 1009    Lab Results  Component Value Date   HGBA1C 5.4 11/08/2020    Assessment and Plan: 1. Migraine without aura and without status migrainosus, not intractable Controlled - topiramate (TOPAMAX) 50 MG tablet; Take 1 tablet (50 mg total) by mouth 2 (two) times daily.  Dispense: 60 tablet; Refill: 6 - SUMAtriptan (IMITREX) 50 MG tablet; TAKE  2 TABLETS (100 MG TOTAL) BY MOUTH ONCE FOR 1 DOSE. MAY REPEAT IN 2 HOURS IF HEADACHE PERSISTS, MAX 4 TABLETS/DAY  Dispense: 10 tablet; Refill: 6 - topiramate (TOPAMAX) 50 MG tablet; Take 1 tablet (50 mg total) by mouth 2 (two) times daily.  Dispense: 60 tablet; Refill: 6  2. Essential hypertension Stable Counseled on blood pressure goal of less than 130/80, low-sodium, DASH diet, medication compliance, 150 minutes of moderate intensity exercise per week. Discussed medication compliance, adverse effects. - lisinopril (ZESTRIL) 20 MG tablet; Take 1 tablet (20 mg total) by mouth daily.  Dispense: 30 tablet; Refill: 6 - amLODipine (NORVASC) 5 MG tablet; Take 1 tablet (5 mg total) by mouth daily.  Dispense: 30 tablet; Refill: 6 - LP+Non-HDL Cholesterol; Future - CMP14+EGFR; Future - CBC with Differential/Platelet; Future  3. Left upper quadrant abdominal pain She has left-sided flank pain and abdominal x-ray has been unrevealing High suspicion for muscle spasm but unfortunately muscle relaxants and analgesics have been ineffective We will proceed with imaging to exclude nephrolithiasis - CT RENAL STONE STUDY; Future  4. Corn of foot - Ambulatory referral to Podiatry  5. Screening for diabetes mellitus - Hemoglobin A1c; Future  6. Other chronic sinusitis Uncontrolled on current regimen Will change her on Flonase to Afrin nasal spray and she has been advised to perform nasal irrigation - oxymetazoline (AFRIN NASAL SPRAY) 0.05 % nasal spray; Place 1 spray into both  nostrils 2 (two) times daily.  Dispense: 30 mL; Refill: 0   Follow Up Instructions: 57-month  I discussed the assessment and treatment plan with the patient. The patient was provided an opportunity to ask questions and all were answered. The patient agreed with the plan and demonstrated an understanding of the instructions.   The patient was advised to call back or seek an in-person evaluation if the symptoms worsen or if the condition fails to improve as anticipated.     I provided 15 minutes total of non-face-to-face time during this encounter.   ECharlott Rakes MD, FAAFP. [facility] [place]  10/31/2021, 2:13 PM

## 2021-10-31 NOTE — Telephone Encounter (Signed)
-----   Message from Charlott Rakes, MD sent at 10/31/2021  2:25 PM EST ----- Regarding: Lab appointment Hello, Please schedule for lab appointment on 11/02/2021 at 8:30 AM. Thanks, -Dr. Margarita Rana

## 2021-11-01 ENCOUNTER — Encounter: Payer: Self-pay | Admitting: Family Medicine

## 2021-11-02 ENCOUNTER — Other Ambulatory Visit (HOSPITAL_COMMUNITY): Payer: Self-pay

## 2021-11-02 ENCOUNTER — Other Ambulatory Visit: Payer: Self-pay

## 2021-11-08 ENCOUNTER — Telehealth: Payer: Self-pay | Admitting: Family Medicine

## 2021-11-08 NOTE — Telephone Encounter (Signed)
Copied from [place] [id number]. Topic: General - Other >> Nov 08, 2021 10:29 AM Tessa Lerner A wrote: Reason for CRM: [facility] has called to share that patient's insurance requires prior authorization of their scheduled CT scan  Please contact further when possible

## 2021-11-09 ENCOUNTER — Ambulatory Visit (HOSPITAL_COMMUNITY): Payer: 59

## 2021-11-13 ENCOUNTER — Other Ambulatory Visit (HOSPITAL_COMMUNITY): Payer: Self-pay

## 2021-11-15 NOTE — Telephone Encounter (Signed)
Pa has been submitted it is currently under clinical review.

## 2021-11-16 ENCOUNTER — Ambulatory Visit (HOSPITAL_COMMUNITY): Payer: 59

## 2021-11-23 ENCOUNTER — Ambulatory Visit (HOSPITAL_COMMUNITY): Payer: 59

## 2021-11-28 ENCOUNTER — Ambulatory Visit (INDEPENDENT_AMBULATORY_CARE_PROVIDER_SITE_OTHER): Payer: 59 | Admitting: Podiatry

## 2021-11-28 ENCOUNTER — Other Ambulatory Visit: Payer: Self-pay

## 2021-11-28 DIAGNOSIS — L853 Xerosis cutis: Secondary | ICD-10-CM | POA: Diagnosis not present

## 2021-11-28 NOTE — Progress Notes (Signed)
°  Subjective:  Patient ID: Barbara Dunn, female    DOB: [birth year],  MRN: [id number]  Chief Complaint  Patient presents with   [place] - pt reports she noticed 2 months ago after getting a pedicure at nail salon- no pain    51 y.o. female presents with the above complaint. History confirmed with patient.   Objective:  Physical Exam: warm, good capillary refill, no trophic changes or ulcerative lesions, normal DP and PT pulses, and normal sensory exam. Small callus lateral plantar heel without core - non-weightbearing surface of the heel Assessment:   1. Xerosis cutis    Plan:  Patient was evaluated and treated and all questions answered.  Xerosis, callus -Courtesy debridement. Does not appear fungal. Could be from dryness or even stepping on something. Hopefully this does not recur. F/u should issues persist.  No follow-ups on file.

## 2021-12-05 ENCOUNTER — Telehealth: Payer: Self-pay

## 2021-12-05 NOTE — Telephone Encounter (Signed)
PA has been submitted and clinical notes were faxed over to [employer] on the same day. I will re fax the office notes again.

## 2021-12-05 NOTE — Telephone Encounter (Signed)
-----   Message from Billee Cashing sent at 12/05/2021  9:57 AM EST ----- Regarding: Auth Pending Good Morning,  Just wanted to make aware patient Barbara Dunn is pending member scheduling /additional information needed per [employer]. Patient is scheduled for CT RENAL STONE STUDY on 12/07/2021. If the authorization is not in place before 1:00PM the day prior to the procedure the patient may be canceled or rescheduled until authorization is in place  Thank you for your help.

## 2021-12-07 ENCOUNTER — Ambulatory Visit (HOSPITAL_COMMUNITY): Payer: 59

## 2021-12-08 NOTE — Telephone Encounter (Signed)
Pt need a peer to peer review so that scan can be done in [place], her insurance requires her to go to another location but patient states that it is too far for her.

## 2021-12-08 NOTE — Telephone Encounter (Signed)
Pt called with a ref # to appeal for the CT from her insurance .  So they can do the CT at the hospital.  REF [id number]  [phone]

## 2021-12-11 NOTE — Telephone Encounter (Signed)
I called insurance company for the peer to peer review.  Her company would need her to go on the [employer] or call [place] to find  out approved sites in [place] for her imaging closest to her after which we can call the insurance company back to request that site.

## 2021-12-14 ENCOUNTER — Other Ambulatory Visit: Payer: Self-pay | Admitting: Family Medicine

## 2021-12-14 ENCOUNTER — Other Ambulatory Visit (HOSPITAL_COMMUNITY): Payer: Self-pay

## 2021-12-14 DIAGNOSIS — Z3041 Encounter for surveillance of contraceptive pills: Secondary | ICD-10-CM

## 2021-12-14 MED ORDER — NORGESTIMATE-ETH ESTRADIOL 0.25-35 MG-MCG PO TABS
1.0000 | ORAL_TABLET | Freq: Every day | ORAL | 6 refills | Status: DC
  Filled 2021-12-14: qty 28, 28d supply, fill #0
  Filled 2022-01-08: qty 28, 28d supply, fill #1
  Filled 2022-02-08: qty 28, 28d supply, fill #2
  Filled 2022-03-08: qty 28, 28d supply, fill #3
  Filled 2022-04-02: qty 28, 28d supply, fill #4
  Filled 2022-05-01: qty 28, 28d supply, fill #5
  Filled 2022-05-28: qty 28, 28d supply, fill #6

## 2021-12-14 NOTE — Telephone Encounter (Signed)
Requested Prescriptions  Pending Prescriptions Disp Refills   norgestimate-ethinyl estradiol (FEMYNOR) 0.25-35 MG-MCG tablet 28 tablet 6    Sig: TAKE 1 TABLET BY MOUTH DAILY.     OB/GYN:  Contraceptives Passed - 12/14/2021  7:35 AM      Passed - Last BP in normal range    BP Readings from Last 1 Encounters:  09/27/21 121/80         Passed - Valid encounter within last 12 months    Recent Outpatient Visits          1 month ago Left upper quadrant abdominal pain   [place], [place], MD   4 months ago Flank pain   [facility] [place], [place], [place]   8 months ago Flank pain   [facility] [place], Charlane Ferretti, MD   1 year ago Screening for diabetes mellitus   [place], Enobong, MD   1 year ago Cold intolerance   [place], Enobong, MD             Passed - Patient is not a smoker

## 2021-12-19 NOTE — Telephone Encounter (Signed)
Pt was called and she states that her insurance has approved the facility and she is scheduled for 12/22/2021

## 2021-12-21 ENCOUNTER — Ambulatory Visit (HOSPITAL_COMMUNITY)
Admission: RE | Admit: 2021-12-21 | Discharge: 2021-12-21 | Disposition: A | Discharge status: Home / Self Care | Payer: 59 | Source: Ambulatory Visit | Attending: Family Medicine | Admitting: Family Medicine

## 2021-12-21 ENCOUNTER — Other Ambulatory Visit: Payer: Self-pay

## 2021-12-21 DIAGNOSIS — N200 Calculus of kidney: Secondary | ICD-10-CM | POA: Diagnosis not present

## 2021-12-21 DIAGNOSIS — K429 Umbilical hernia without obstruction or gangrene: Secondary | ICD-10-CM | POA: Diagnosis not present

## 2021-12-21 DIAGNOSIS — R1012 Left upper quadrant pain: Secondary | ICD-10-CM | POA: Diagnosis present

## 2021-12-25 ENCOUNTER — Other Ambulatory Visit: Payer: Self-pay | Admitting: Family Medicine

## 2021-12-25 ENCOUNTER — Ambulatory Visit: Payer: Self-pay

## 2021-12-25 DIAGNOSIS — N200 Calculus of kidney: Secondary | ICD-10-CM

## 2021-12-25 DIAGNOSIS — R911 Solitary pulmonary nodule: Secondary | ICD-10-CM

## 2021-12-25 DIAGNOSIS — N281 Cyst of kidney, acquired: Secondary | ICD-10-CM

## 2021-12-25 NOTE — Telephone Encounter (Signed)
Addressed.

## 2021-12-25 NOTE — Telephone Encounter (Signed)
Received call from [facility] radiology. Results are ready for review. ?

## 2021-12-26 ENCOUNTER — Other Ambulatory Visit: Payer: Self-pay

## 2021-12-26 ENCOUNTER — Ambulatory Visit: Payer: 59 | Attending: Family Medicine

## 2021-12-26 DIAGNOSIS — Z131 Encounter for screening for diabetes mellitus: Secondary | ICD-10-CM | POA: Diagnosis not present

## 2021-12-26 DIAGNOSIS — I10 Essential (primary) hypertension: Secondary | ICD-10-CM | POA: Diagnosis not present

## 2021-12-27 LAB — CBC WITH DIFFERENTIAL/PLATELET
Basophils Absolute: 0.1 10*3/uL (ref 0.0–0.2)
Basos: 1 %
EOS (ABSOLUTE): 0.1 10*3/uL (ref 0.0–0.4)
Eos: 2 %
Hematocrit: 37.8 % (ref 34.0–46.6)
Hemoglobin: 12.7 g/dL (ref 11.1–15.9)
Immature Grans (Abs): 0 10*3/uL (ref 0.0–0.1)
Immature Granulocytes: 0 %
Lymphocytes Absolute: 2.3 10*3/uL (ref 0.7–3.1)
Lymphs: 35 %
MCH: 29.7 pg (ref 26.6–33.0)
MCHC: 33.6 g/dL (ref 31.5–35.7)
MCV: 89 fL (ref 79–97)
Monocytes Absolute: 0.7 10*3/uL (ref 0.1–0.9)
Monocytes: 10 %
Neutrophils Absolute: 3.4 10*3/uL (ref 1.4–7.0)
Neutrophils: 52 %
Platelets: 247 10*3/uL (ref 150–450)
RBC: 4.27 x10E6/uL (ref 3.77–5.28)
RDW: 11.4 % — ABNORMAL LOW (ref 11.7–15.4)
WBC: 6.5 10*3/uL (ref 3.4–10.8)

## 2021-12-27 LAB — CMP14+EGFR
ALT: 15 IU/L (ref 0–32)
AST: 19 IU/L (ref 0–40)
Albumin/Globulin Ratio: 1.6 (ref 1.2–2.2)
Albumin: 4.2 g/dL (ref 3.8–4.8)
Alkaline Phosphatase: 58 IU/L (ref 44–121)
BUN/Creatinine Ratio: 9 (ref 9–23)
BUN: 9 mg/dL (ref 6–24)
Bilirubin Total: 0.3 mg/dL (ref 0.0–1.2)
CO2: 21 mmol/L (ref 20–29)
Calcium: 8.8 mg/dL (ref 8.7–10.2)
Chloride: 103 mmol/L (ref 96–106)
Creatinine, Ser: 0.97 mg/dL (ref 0.57–1.00)
Globulin, Total: 2.6 g/dL (ref 1.5–4.5)
Glucose: 88 mg/dL (ref 70–99)
Potassium: 4.2 mmol/L (ref 3.5–5.2)
Sodium: 136 mmol/L (ref 134–144)
Total Protein: 6.8 g/dL (ref 6.0–8.5)
eGFR: 72 mL/min/{1.73_m2} (ref >59)

## 2021-12-27 LAB — HEMOGLOBIN A1C
Est. average glucose Bld gHb Est-mCnc: 108 mg/dL
Hgb A1c MFr Bld: 5.4 % (ref 4.8–5.6)

## 2021-12-27 LAB — LP+NON-HDL CHOLESTEROL
Cholesterol, Total: 193 mg/dL (ref 100–199)
HDL: 97 mg/dL (ref >39)
LDL Chol Calc (NIH): 84 mg/dL (ref 0–99)
Total Non-HDL-Chol (LDL+VLDL): 96 mg/dL (ref 0–129)
Triglycerides: 66 mg/dL (ref 0–149)
VLDL Cholesterol Cal: 12 mg/dL (ref 5–40)

## 2022-01-02 ENCOUNTER — Other Ambulatory Visit: Payer: Self-pay | Admitting: Family Medicine

## 2022-01-02 ENCOUNTER — Other Ambulatory Visit (HOSPITAL_COMMUNITY): Payer: Self-pay

## 2022-01-02 NOTE — Telephone Encounter (Signed)
Requested medication (s) are due for refill today: Yes ? ?Requested medication (s) are on the active medication list: Yes ? ?Last refill:  05/11/21 ? ?Future visit scheduled: No ? ?Notes to clinic:  See request. ? ? ? ?Requested Prescriptions  ?Pending Prescriptions Disp Refills  ? triamcinolone cream (KENALOG) 0.1 % 30 g 0  ?  Sig: APPLY 1 APPLICATION TOPICALLY 2 (TWO) TIMES DAILY.  ?  ? Not Delegated - Dermatology:  Corticosteroids Failed - 01/02/2022  8:34 AM  ?  ?  Failed - This refill cannot be delegated  ?  ?  Passed - Valid encounter within last 12 months  ?  Recent Outpatient Visits   ? ?      ? 2 months ago Left upper quadrant abdominal pain  ? [place], Charlane Ferretti, MD  ? 5 months ago Flank pain  ? [place], [place], [place]  ? 9 months ago Flank pain  ? [place], Charlane Ferretti, MD  ? 1 year ago Screening for diabetes mellitus  ? [place], MD  ? 1 year ago Cold intolerance  ? [place] Charlott Rakes, MD  ? ?  ?  ? ?  ?  ?  ? ?

## 2022-01-03 ENCOUNTER — Other Ambulatory Visit: Payer: Self-pay | Admitting: Family Medicine

## 2022-01-03 ENCOUNTER — Other Ambulatory Visit (HOSPITAL_COMMUNITY): Payer: Self-pay

## 2022-01-03 MED ORDER — TRIAMCINOLONE ACETONIDE 0.1 % EX CREA
TOPICAL_CREAM | Freq: Two times a day (BID) | CUTANEOUS | 2 refills | Status: DC
  Filled 2022-01-03: qty 30, 15d supply, fill #0
  Filled 2022-04-17: qty 30, 15d supply, fill #1
  Filled 2022-07-16: qty 30, 15d supply, fill #2

## 2022-01-03 NOTE — Telephone Encounter (Signed)
Requested medication (s) are due for refill today: yes ? ?Requested medication (s) are on the active medication list: yes   ? ?Last refill: 05/11/21  30g  0 refills ? ?Future visit scheduled no ? ?Notes to clinic: not delegated ? ?Requested Prescriptions  ?Pending Prescriptions Disp Refills  ? triamcinolone cream (KENALOG) 0.1 % 30 g 0  ?  Sig: APPLY 1 APPLICATION TOPICALLY 2 (TWO) TIMES DAILY.  ?  ? Not Delegated - Dermatology:  Corticosteroids Failed - 01/03/2022  2:07 PM  ?  ?  Failed - This refill cannot be delegated  ?  ?  Passed - Valid encounter within last 12 months  ?  Recent Outpatient Visits   ? ?      ? 2 months ago Left upper quadrant abdominal pain  ? [place], Charlane Ferretti, MD  ? 5 months ago Flank pain  ? [place], [place], [place]  ? 9 months ago Flank pain  ? [place], Charlane Ferretti, MD  ? 1 year ago Screening for diabetes mellitus  ? [place], MD  ? 1 year ago Cold intolerance  ? [place] Charlott Rakes, MD  ? ?  ?  ? ?  ?  ?  ? ? ? ? ?

## 2022-01-05 ENCOUNTER — Ambulatory Visit (HOSPITAL_COMMUNITY): Payer: 59

## 2022-01-08 ENCOUNTER — Other Ambulatory Visit (HOSPITAL_COMMUNITY): Payer: Self-pay

## 2022-01-10 ENCOUNTER — Telehealth: Payer: Self-pay | Admitting: *Deleted

## 2022-01-10 NOTE — Telephone Encounter (Signed)
Copied from [place] [id number]. Topic: General - Other ?>> Jan 10, 2022 12:45 PM McGill, Nelva Bush wrote: ?Reason for CRM: Tanya from the [facility] stated CT-scan authorization is required per [employer]. ?No authorization is on file.? ? ?Lavella Lemons stated that there is an authorization approved but the wrong CPT CODE. ? ?Correct code: [phone] ?

## 2022-01-11 ENCOUNTER — Telehealth: Payer: Self-pay

## 2022-01-11 NOTE — Telephone Encounter (Signed)
Pa has been started currently under review. ?

## 2022-01-11 NOTE — Telephone Encounter (Signed)
-----   Message from Ladell Pier, MD sent at 01/10/2022  4:23 PM EDT ----- ?Regarding: FW: Auth Needed ?See message below. ?----- Message ----- ?From: Guido Sander D ?Sent: 01/10/2022  12:40 PM EDT ?To: Algie Coffer, [facility] ?Subject: Auth Needed                                   ? ?Barbara Dunn is on the schedule for CT CHEST W CONTRAST on 01/12/2022 and I am showing Josem Kaufmann is required but not on file per [facility]. I just wanted to check in with you to see if there were any updates for the authorization. If the authorization is not in place before 1:00PM the day prior to the procedure the patient may be canceled or rescheduled until authorization is in place.  ? ?Thank you for your help.  ? ? ? ?

## 2022-01-11 NOTE — Telephone Encounter (Signed)
Pa has been submitted to [facility] and it is currently under review due to the site that was picked. Her insurance does not want to cover if done at [facility]. Office notes and scans has been set to evicore to get Ct approved for [employer]. ?

## 2022-01-12 ENCOUNTER — Other Ambulatory Visit: Payer: Self-pay

## 2022-01-12 ENCOUNTER — Ambulatory Visit (HOSPITAL_COMMUNITY): Payer: 59

## 2022-01-12 ENCOUNTER — Ambulatory Visit (HOSPITAL_COMMUNITY)
Admission: RE | Admit: 2022-01-12 | Discharge: 2022-01-12 | Disposition: A | Discharge status: Home / Self Care | Payer: 59 | Source: Ambulatory Visit | Attending: Family Medicine | Admitting: Family Medicine

## 2022-01-12 DIAGNOSIS — R911 Solitary pulmonary nodule: Secondary | ICD-10-CM | POA: Insufficient documentation

## 2022-01-12 DIAGNOSIS — R918 Other nonspecific abnormal finding of lung field: Secondary | ICD-10-CM | POA: Diagnosis not present

## 2022-01-12 MED ORDER — IOHEXOL 300 MG/ML  SOLN
75.0000 mL | Freq: Once | INTRAMUSCULAR | Status: AC | PRN
Start: 2022-01-12 — End: 2022-01-12
  Administered 2022-01-12: 75 mL via INTRAVENOUS

## 2022-01-15 ENCOUNTER — Other Ambulatory Visit: Payer: Self-pay | Admitting: Family Medicine

## 2022-01-15 DIAGNOSIS — R911 Solitary pulmonary nodule: Secondary | ICD-10-CM

## 2022-01-16 ENCOUNTER — Encounter: Payer: Self-pay | Admitting: Family Medicine

## 2022-01-24 ENCOUNTER — Encounter: Payer: 59 | Admitting: *Deleted

## 2022-01-24 ENCOUNTER — Encounter: Payer: Self-pay | Admitting: Emergency Medicine

## 2022-01-24 ENCOUNTER — Ambulatory Visit: Payer: 59 | Admitting: Emergency Medicine

## 2022-01-24 DIAGNOSIS — J45909 Unspecified asthma, uncomplicated: Secondary | ICD-10-CM | POA: Insufficient documentation

## 2022-01-24 DIAGNOSIS — J4520 Mild intermittent asthma, uncomplicated: Secondary | ICD-10-CM

## 2022-01-24 DIAGNOSIS — R911 Solitary pulmonary nodule: Secondary | ICD-10-CM

## 2022-01-24 DIAGNOSIS — Z006 Encounter for examination for normal comparison and control in clinical research program: Secondary | ICD-10-CM

## 2022-01-24 NOTE — Assessment & Plan Note (Signed)
We will perform pulmonary function testing to quantify degree of obstruction and to determine whether she would be a good candidate for possible VATS resection should this ever become indicated. ?

## 2022-01-24 NOTE — Assessment & Plan Note (Signed)
Medial right midlung pulmonary nodule identified 12/21/2021 on renal stone CT and confirmed on CT chest 01/12/2022.  In retrospect I may be able to see the beginnings of some nodular change on CT chest 01/09/2020, difficult to say whether this is the exact same location.  Intermediate probability for malignancy based on [place].  There is a bronchus that leads to the abnormality, consider atelectasis.  We will plan a short-term follow-up CT in 3 months.  If the lesion persists then would consider PET scan, diagnostics that could include navigational bronchoscopy versus referral for VATS and resection.  She will need pulmonary function testing to determine whether she would be suitability for VATS. ?

## 2022-01-24 NOTE — Addendum Note (Signed)
Addended by: Gavin Potters R on: 01/24/2022 10:13 AM ? ? Modules accepted: Orders ? ?

## 2022-01-24 NOTE — Progress Notes (Signed)
? ?Subjective:  ? ? Patient ID: Barbara Dunn, female    DOB: 08/17/1971, 51 y.o.   MRN: [id number] ? ?HPI ?51 year old former smoker (2-3 pack years) with a history of hypertension, obesity, migraines, osteoarthritis, asthma that was dx several years ago.  She is here for evaluation of an abnormal CT scan of the chest.  She was evaluated for right flank pain and suspected renal calculi with an abdominal CT 12/21/2021.  This identified a medial right midlung nodule as below. ?She describes good functional capacity. Sometimes SOB w stairs. No cough or wheeze on a regular basis, but she has seen some hemoptysis on 3 distinct occasions. First saw it 3 months ago, and then 2 days ago. She has had some bloody nasal discharge as well.  ?She uses albuterol very rarely. Can be sensitive to perfume, etc. Has never needed prednisone.  ? ?CT chest 01/09/2020 reviewed by me shows no hilar, mediastinal lymphadenopathy.  No airspace disease.  No nodule seen (a right midlung nodule has been suspected on chest x-ray, appears to be vascular shadow) ? ?CT abdomen pelvis (renal stone) 12/21/2021 reviewed by me shows a 1.4 x 1.0 right lower lobe pulmonary nodule, new compared with prior chest CT.  There were nonobstructing renal calculi. ? ?Dedicated CT scan of the chest 01/12/2022 reviewed by me shows 1.5 x 1.5 cm medial right lower lobe opacity with a bronchus sign, little change compared with 3 weeks earlier. ? ?Intermediate prob by Larrie Kass 14-17% ? ? ? ? ?Review of Systems ?As per HPI ? ?Past Medical History:  ?Diagnosis Date  ? Anemia   ? Arthritis   ? Bronchitis   ? Hypertension   ? Migraines   ? Obesity   ?  ? ?Family History  ?Problem Relation Age of Onset  ? Hypertension Mother   ? Schizophrenia Mother   ? Schizophrenia Sister   ? Schizophrenia Brother   ? Schizophrenia Brother   ?  ? ?Social History  ? ?Socioeconomic History  ? Marital status: Single  ?  Spouse name: Not on file  ? Number of children: Not on file  ? Years of  education: Not on file  ? Highest education level: Not on file  ?Occupational History  ? Not on file  ?Tobacco Use  ? Smoking status: Former  ?  Types: Cigarettes  ?  Quit date: 12/15/2003  ?  Years since quitting: 18.1  ? Smokeless tobacco: Never  ? Tobacco comments:  ?  quit 10 years ago  ?Vaping Use  ? Vaping Use: Never used  ?Substance and Sexual Activity  ? Alcohol use: Yes  ?  Alcohol/week: 2.0 standard drinks  ?  Types: 1 Glasses of wine, 1 Shots of liquor per week  ?  Comment: Once every 3 months   ? Drug use: No  ? Sexual activity: Yes  ?  Birth control/protection: Other-see comments  ?  Comment: tubal  ?Other Topics Concern  ? Not on file  ?Social History Narrative  ? Pt has GED. CNA as well.   ? ?Social Determinants of Health  ? ?Financial Resource Strain: Not on file  ?Food Insecurity: Not on file  ?Transportation Needs: Not on file  ?Physical Activity: Not on file  ?Stress: Not on file  ?Social Connections: Not on file  ?Intimate Partner Violence: Not on file  ?  ? ?No Known Allergies  ? ?Outpatient Medications Prior to Visit  ?Medication Sig Dispense Refill  ? amLODipine (NORVASC) 5 MG tablet  Take 1 tablet (5 mg total) by mouth daily. 30 tablet 6  ? aspirin 81 MG tablet Take 81 mg by mouth daily.    ? benzonatate (TESSALON) 100 MG capsule TAKE 1 CAPSULE (100 MG TOTAL) BY MOUTH 3 (THREE) TIMES DAILY. 30 capsule 0  ? BIOTIN PO Take 2 tablets by mouth daily.     ? cetirizine (ZYRTEC) 10 MG tablet Take 10 mg by mouth daily.    ? chlorhexidine (PERIDEX) 0.12 % solution Use as directed 5 mLs in the mouth or throat as needed.    ? chlorpheniramine-HYDROcodone (TUSSIONEX PENNKINETIC ER) 10-8 MG/5ML SUER Take 5 mLs by mouth every 12 (twelve) hours as needed for cough. 115 mL 0  ? docusate sodium (COLACE) 100 MG capsule Take 100 mg by mouth as needed.     ? fluticasone (FLONASE) 50 MCG/ACT nasal spray PLACE 2 SPRAYS INTO BOTH NOSTRILS DAILY. 16 g 2  ? HYDROcodone-homatropine (HYCODAN) 5-1.5 MG/5ML syrup Take 5  mLs by mouth every 6 (six) hours as needed for cough. 120 mL 0  ? lidocaine (LIDODERM) 5 % Place 1 patch onto the skin daily. Remove & Discard patch within 12 hours or as directed by MD 30 patch 0  ? lisinopril (ZESTRIL) 20 MG tablet Take 1 tablet (20 mg total) by mouth daily. 30 tablet 6  ? meclizine (ANTIVERT) 25 MG tablet Take 1 tablet (25 mg total) by mouth 3 (three) times daily as needed for dizziness. 60 tablet 1  ? Multiple Vitamins-Minerals (MULTIVITAMIN ADULT PO) Take 1 tablet by mouth daily.     ? norgestimate-ethinyl estradiol (FEMYNOR) 0.25-35 MG-MCG tablet Take 1 tablet by mouth daily. 28 tablet 6  ? ondansetron (ZOFRAN) 4 MG tablet Take 1 tablet (4 mg total) by mouth every 6 (six) hours. 12 tablet 0  ? oxymetazoline (AFRIN NASAL SPRAY) 0.05 % nasal spray Place 1 spray into both nostrils 2 (two) times daily. 30 mL 0  ? SUMAtriptan (IMITREX) 50 MG tablet TAKE 2 TABLETS (100 MG TOTAL) BY MOUTH ONCE FOR 1 DOSE. MAY REPEAT IN 2 HOURS IF HEADACHE PERSISTS, MAX 4 TABLETS/DAY 10 tablet 6  ? topiramate (TOPAMAX) 50 MG tablet Take 1 tablet (50 mg total) by mouth 2 (two) times daily. 60 tablet 6  ? triamcinolone cream (KENALOG) 0.1 % APPLY 1 APPLICATION TOPICALLY 2 (TWO) TIMES DAILY. 30 g 2  ? albuterol (VENTOLIN HFA) 108 (90 Base) MCG/ACT inhaler INHALE 1 PUFF INTO THE LUNGS EVERY 6 HOURS AS NEEDED FOR WHEEZING OR SHORTNESS OF BREATH. 18 g 1  ? cyclobenzaprine (FLEXERIL) 10 MG tablet Take 1 tablet (10 mg total) by mouth 2 (two) times daily as needed for muscle spasms. (Patient not taking: Reported on 09/27/2021) 60 tablet 0  ? fluticasone (FLOVENT HFA) 110 MCG/ACT inhaler INHALE 1 PUFF INTO THE LUNGS 2 (TWO) TIMES DAILY. 12 g 2  ? gabapentin (NEURONTIN) 300 MG capsule Take 1 capsule (300 mg total) by mouth at bedtime. (Patient not taking: Reported on 01/24/2022) 90 capsule 0  ? topiramate (TOPAMAX) 50 MG tablet Take 1 tablet (50 mg total) by mouth 2 (two) times daily. (Patient not taking: Reported on 01/24/2022) 60  tablet 6  ? ?No facility-administered medications prior to visit.  ? ? ? ?   ?Objective:  ? Physical Exam ? ?Vitals:  ? 01/24/22 0936  ?BP: 132/84  ?Pulse: 79  ?Temp: 98.4 ?F (36.9 ?C)  ?TempSrc: Oral  ?SpO2: 100%  ?Weight: 209 lb 6.4 oz (95 kg)  ?Height: 5'  4" (1.626 m)  ? ?Gen: Pleasant, overwt woman, in no distress,  normal affect ? ?ENT: No lesions,  mouth clear,  oropharynx clear, no postnasal drip ? ?Neck: No JVD, no stridor ? ?Lungs: No use of accessory muscles, no crackles or wheezing on normal respiration, no wheeze on forced expiration ? ?Cardiovascular: RRR, heart sounds normal, no murmur or gallops, no peripheral edema ? ?Musculoskeletal: No deformities, no cyanosis or clubbing ? ?Neuro: alert, awake, non focal ? ?Skin: Warm, no lesions or rash ? ?   ?Assessment & Plan:  ?Pulmonary nodule ?Medial right midlung pulmonary nodule identified 12/21/2021 on renal stone CT and confirmed on CT chest 01/12/2022.  In retrospect I may be able to see the beginnings of some nodular change on CT chest 01/09/2020, difficult to say whether this is the exact same location.  Intermediate probability for malignancy based on [place].  There is a bronchus that leads to the abnormality, consider atelectasis.  We will plan a short-term follow-up CT in 3 months.  If the lesion persists then would consider PET scan, diagnostics that could include navigational bronchoscopy versus referral for VATS and resection.  She will need pulmonary function testing to determine whether she would be suitability for VATS. ? ?Asthma ?We will perform pulmonary function testing to quantify degree of obstruction and to determine whether she would be a good candidate for possible VATS resection should this ever become indicated. ? ? ?Baltazar Apo, MD, PhD ?01/24/2022, 10:08 AM ?[facility] ?[phone] or if no answer before 7:00PM call [phone] ?For any issues after 7:00PM please call eLink [phone] ? ? ?

## 2022-01-24 NOTE — Research (Signed)
Title: [place]: CliNIcal Utility of ManaGement of Patients witH CT and LDCT Identified Pulmonary Nodules UsinG the Percepta NasAL Swab ClassifiEr -- with Familiarization ?  ?Protocol #: [id number] Sponsor: [employer] ?  ?Protocol Revision 1 dated 01Sep2022 and confirmed current on today's visit, IRB approved Revision 1 on 29Dec2022. ?  ?Objectives:  ?Primary: To evaluate if use of the Percepta Nasal Swab test in the diagnostic work up ?of newly identified pulmonary nodules reduces the number of invasive procedures in the ?group classified as low-risk by the test and that are benign as compared to a control ?group managed without a Percepta Nasal Swab test result. ?                  A newly identified nodule is defined as any nodule first identified on imaging ?                  <90 days prior to nasal sample collection that hasn?t undergone a diagnostic ?                   procedure for the management of their index nodule prior to enrollment. ?                  CT imaging includes conventional CT, LDCT, HRCT ?                  Benign diagnosis is defined as a specific diagnosis of a benign condition, ?                   radiographic resolution or stability at ? 24 months, or no cytological, ?                   radiological, or pathological evidence of cancer. ?                  Procedures will be categorized as either invasive or non-invasive in the Data ?                   Management Plan (DMP). ?Secondary: To evaluate if use of the Percepta Nasal Swab test in the diagnostic work ?up of newly identified pulmonary nodules increases the proportion of subjects classified ?as high-risk by the test and have primary lung cancer that go directly to appropriate ?therapy as compared to a control group managed without a Percepta Nasal Swab test ?result. ?                   Proportion of subjects that go directly to appropriate therapy is defined as those ?                    subjects that undergo surgery, ablative  or other appropriate therapy as the next ?                    step after the Percepta Nasal Swab test result without intervening non-surgical ?                    procedures ?                            a. Non-surgical procedures include diagnostic PET, but not PET for ?  staging purposes. ?                            b. Appropriate therapies will be defined in the CRF. ?                   A newly identified nodule is defined as any nodule first identified on imaging ?                   <90 days prior to nasal sample collection. ?                   Lung cancer diagnosis is defined as established by cytology or pathology, or in ?                    circumstances where a presumptive diagnosis of cancer led to definitive ?                    ablative or other appropriate therapy without pathology. ?Key Inclusion Criteria: ? Inclusion Criteria: ? Able to tolerate nasal epithelial specimen collection ? Signed written Informed Consent obtained ? Subject clinical history available for review by sponsor and regulatory agencies ? New nodule first identified on imaging < 90 days prior to nasal sample collection ?(index nodule) ? CT report available for index nodule ? 51 - 49 years of age ? Current or former smoker (>100 cigarettes in a lifetime) ? Pulmonary nodule ?30 mm detected by CT ? ?Key Exclusion Criteria: ?Exclusion Criteria ? Subject has undergone a diagnostic procedure for the management of their ?index nodule after the index CT and prior to enrollment ? Active cancer (other than non-melanoma skin cancer) ? Prior primary lung cancer (prior non-lung cancer acceptable) ? Prior participation in this study (i.e., subjects may not be enrolled more than ?once) ? Current active treatment with an investigational device or drug (patients in trial ?follow up period are okay if intervention phase is complete) ? Patient enrolled or planned to be enrolled in another clinical trial that may ?influence  management of the patient?s nodule ? Concurrent or planned use of tools or tests for assigning lung nodule risk of ?malignancy (e.g., genomic or proteomic blood tests) other than clinically ?validated risk calculators ? ?Clinical Research Coordinator / Research RN note : This visit is for enrollment / baseline Subject [id number] with DOB: [id number] on 05Apr2023 for the above protocol is an Enrollment Visit and is for purpose of research.  ?  ?Subject expressed interest and consent in continuing as a study subject. Subject confirmed contact information (e.g. address, telephone, email). Subject thanked for participation in research and contribution to science.   ?  ?During this visit on 05Apr2023  , the subject reviewed and signed the consent form, provided demographics, and had a nasal swab collected per the above referenced protocol. Please refer to the subject's paper source binder for further details.  ? ?The PI and Sub-I met/discussed the subject prior to consenting patient.  The sub-Investigator  Dr. Baltazar Apo  was present for the consenting process.  ?     ?  ?Signed by ?Jaye Beagle RN, CRN II ? ?

## 2022-01-24 NOTE — Patient Instructions (Signed)
We reviewed your CT scans of the chest today. ?We will plan to repeat your CT scan of the chest in June 2023 to compare with priors. ?Keep your albuterol available use 2 puffs if needed for shortness of breath, chest tightness, wheezing. ?Follow with Dr. Lamonte Sakai in June after your CT so we can review the results together and decide next steps in your evaluation. ?

## 2022-01-26 ENCOUNTER — Ambulatory Visit (HOSPITAL_COMMUNITY): Payer: 59

## 2022-02-08 ENCOUNTER — Other Ambulatory Visit (HOSPITAL_COMMUNITY): Payer: Self-pay

## 2022-02-12 DIAGNOSIS — N3281 Overactive bladder: Secondary | ICD-10-CM | POA: Diagnosis not present

## 2022-02-12 DIAGNOSIS — N281 Cyst of kidney, acquired: Secondary | ICD-10-CM | POA: Diagnosis not present

## 2022-02-12 DIAGNOSIS — N200 Calculus of kidney: Secondary | ICD-10-CM | POA: Diagnosis not present

## 2022-02-16 ENCOUNTER — Other Ambulatory Visit (HOSPITAL_COMMUNITY): Payer: Self-pay

## 2022-03-08 ENCOUNTER — Ambulatory Visit: Payer: Self-pay | Admitting: *Deleted

## 2022-03-08 ENCOUNTER — Other Ambulatory Visit (HOSPITAL_COMMUNITY): Payer: Self-pay

## 2022-03-08 NOTE — Telephone Encounter (Signed)
  Chief Complaint: Knee swelling Symptoms: left knee swelling and left ankle. Mild- moderate, pt evasive. Frequency: 3 months Pertinent Negatives: Patient denies redness, warmth Disposition: '[]'$ ED /'[]'$ Urgent Care (no appt availability in office) / '[x]'$ Appointment(In office/virtual)/ '[]'$  [place] [facility]/ '[]'$ Home Care/ '[]'$ Refused Recommended Disposition /'[]'$ [facility]/ '[]'$  Follow-up with PCP Additional Notes: Secured first available for June 14th. Advised ED for worsening symptoms. Placed on WaitList. Reason for Disposition  [1] MILD swelling of both ankles (i.e., pedal edema) AND [2] varicose veins    Left knee and ankle swelling for 3 months  Answer Assessment - Initial Assessment Questions 1. ONSET: "When did the swelling start?" (e.g., minutes, hours, days)     3 months 2. LOCATION: "What part of the leg is swollen?"  "Are both legs swollen or just one leg?"     Left knee and ankle 3. SEVERITY: "How bad is the swelling?" (e.g., localized; mild, moderate, severe)  - Localized - small area of swelling localized to one leg  - MILD pedal edema - swelling limited to foot and ankle, pitting edema < 1/4 inch (6 mm) deep, rest and elevation eliminate most or all swelling  - MODERATE edema - swelling of lower leg to knee, pitting edema > 1/4 inch (6 mm) deep, rest and elevation only partially reduce swelling  - SEVERE edema - swelling extends above knee, facial or hand swelling present      Unsure 4. REDNESS: "Does the swelling look red or infected?"      5. PAIN: "Is the swelling painful to touch?" If Yes, ask: "How painful is it?"   (Scale 1-10; mild, moderate or severe)     6-7/10 6. FEVER: "Do you have a fever?" If Yes, ask: "What is it, how was it measured, and when did it start?"      no 7. CAUSE: "What do you think is causing the leg swelling?"       10. OTHER SYMPTOMS: "Do you have any other symptoms?" (e.g., chest pain, difficulty breathing) Rash  Protocols used: Leg  Swelling and Edema-A-AH

## 2022-03-09 NOTE — Telephone Encounter (Signed)
Noted  

## 2022-03-20 ENCOUNTER — Other Ambulatory Visit (HOSPITAL_COMMUNITY): Payer: Self-pay

## 2022-03-21 ENCOUNTER — Other Ambulatory Visit (HOSPITAL_COMMUNITY): Payer: Self-pay

## 2022-03-27 ENCOUNTER — Ambulatory Visit (HOSPITAL_COMMUNITY)
Admission: RE | Admit: 2022-03-27 | Discharge: 2022-03-27 | Disposition: A | Discharge status: Home / Self Care | Payer: 59 | Source: Ambulatory Visit | Attending: Emergency Medicine | Admitting: Emergency Medicine

## 2022-03-27 DIAGNOSIS — R911 Solitary pulmonary nodule: Secondary | ICD-10-CM | POA: Insufficient documentation

## 2022-03-30 ENCOUNTER — Ambulatory Visit (HOSPITAL_BASED_OUTPATIENT_CLINIC_OR_DEPARTMENT_OTHER): Payer: 59 | Admitting: Cardiology

## 2022-04-02 ENCOUNTER — Other Ambulatory Visit (HOSPITAL_COMMUNITY): Payer: Self-pay

## 2022-04-03 ENCOUNTER — Ambulatory Visit: Payer: 59 | Admitting: Emergency Medicine

## 2022-04-03 ENCOUNTER — Ambulatory Visit (INDEPENDENT_AMBULATORY_CARE_PROVIDER_SITE_OTHER): Payer: 59 | Admitting: Emergency Medicine

## 2022-04-03 DIAGNOSIS — R911 Solitary pulmonary nodule: Secondary | ICD-10-CM | POA: Diagnosis not present

## 2022-04-03 LAB — PULMONARY FUNCTION TEST
DL/VA % pred: 121 %
DL/VA: 5.3 ml/min/mmHg/L
DLCO cor % pred: 106 %
DLCO cor: 21.79 ml/min/mmHg
DLCO unc % pred: 106 %
DLCO unc: 21.79 ml/min/mmHg
FEF 25-75 Post: 3 L/sec
FEF 25-75 Pre: 2.34 L/sec
FEF2575-%Change-Post: 28 %
FEF2575-%Pred-Post: 123 %
FEF2575-%Pred-Pre: 96 %
FEV1-%Change-Post: 5 %
FEV1-%Pred-Post: 104 %
FEV1-%Pred-Pre: 98 %
FEV1-Post: 2.36 L
FEV1-Pre: 2.24 L
FEV1FVC-%Change-Post: 3 %
FEV1FVC-%Pred-Pre: 101 %
FEV6-%Change-Post: 2 %
FEV6-%Pred-Post: 100 %
FEV6-%Pred-Pre: 98 %
FEV6-Post: 2.74 L
FEV6-Pre: 2.69 L
FEV6FVC-%Pred-Post: 102 %
FEV6FVC-%Pred-Pre: 102 %
FVC-%Change-Post: 2 %
FVC-%Pred-Post: 97 %
FVC-%Pred-Pre: 95 %
FVC-Post: 2.74 L
FVC-Pre: 2.69 L
Post FEV1/FVC ratio: 86 %
Post FEV6/FVC ratio: 100 %
Pre FEV1/FVC ratio: 83 %
Pre FEV6/FVC Ratio: 100 %
RV % pred: 107 %
RV: 1.84 L
TLC % pred: 92 %
TLC: 4.55 L

## 2022-04-03 NOTE — Progress Notes (Signed)
PFT done today. 

## 2022-04-04 ENCOUNTER — Encounter: Payer: Self-pay | Admitting: Physician Assistant

## 2022-04-04 ENCOUNTER — Ambulatory Visit: Payer: 59 | Admitting: Physician Assistant

## 2022-04-04 ENCOUNTER — Ambulatory Visit (HOSPITAL_COMMUNITY)
Admission: RE | Admit: 2022-04-04 | Discharge: 2022-04-04 | Disposition: A | Discharge status: Home / Self Care | Payer: 59 | Source: Ambulatory Visit | Attending: Physician Assistant | Admitting: Physician Assistant

## 2022-04-04 VITALS — BP 133/87 | HR 80 | Ht 64.0 in | Wt 203.0 lb

## 2022-04-04 DIAGNOSIS — M25471 Effusion, right ankle: Secondary | ICD-10-CM | POA: Diagnosis not present

## 2022-04-04 DIAGNOSIS — L309 Dermatitis, unspecified: Secondary | ICD-10-CM

## 2022-04-04 DIAGNOSIS — M25562 Pain in left knee: Secondary | ICD-10-CM

## 2022-04-04 DIAGNOSIS — I10 Essential (primary) hypertension: Secondary | ICD-10-CM | POA: Diagnosis not present

## 2022-04-04 DIAGNOSIS — Z6834 Body mass index (BMI) 34.0-34.9, adult: Secondary | ICD-10-CM

## 2022-04-04 DIAGNOSIS — M25472 Effusion, left ankle: Secondary | ICD-10-CM | POA: Diagnosis not present

## 2022-04-04 DIAGNOSIS — E6609 Other obesity due to excess calories: Secondary | ICD-10-CM | POA: Diagnosis not present

## 2022-04-04 NOTE — Progress Notes (Signed)
Established Patient Office Visit  Subjective   Patient ID: Barbara Dunn, female    DOB: Sep 30, 1971  Age: 51 y.o. MRN: [id number]  Chief Complaint  Patient presents with   Knee Pain   Rash    States that she has been having pain in her left knee along with swelling for the past 2 to 3 months.  Denies injury or trauma.  States that occasionally the pain will involve her whole left leg.  Does endorse that she has been doing more lifting due to taking care of her mother who is currently in hospice care.  States that over the last 2 to 3 months she has had 2 or 3 episodes of bilateral ankle swelling, states that it tends to get larger as the day goes on and will be mostly resolved when she wakes up in the morning.  States that she does not check her blood pressure at home.  States that she is only sleeping 4 or 5 hours a night, has difficulty falling asleep and staying asleep, does endorse increased stressors.  States that she is scared to try anything to help her sleep due to having to be able to wake up to help her mother.  States that she drinks approximately 2 bottles of water a day.  Also complains of dry occasionally itchy rash on her right hand, states that she has been wearing gloves more often as well as having her hands in water more often.  Otherwise denies any new detergents, lotions, body washes, fragrances, medications.  States that she has tried Kenalog cream on it with some relief.  States that she is out of that cream.  No one else in house with same rash.      Past Medical History:  Diagnosis Date   Anemia    Arthritis    Bronchitis    Hypertension    Migraines    Obesity    Social History   Socioeconomic History   Marital status: Single    Spouse name: Not on file   Number of children: Not on file   Years of education: Not on file   Highest education level: Not on file  Occupational History   Not on file  Tobacco Use   Smoking status: Former    Types:  Cigarettes    Quit date: 12/15/2003    Years since quitting: 18.3   Smokeless tobacco: Never   Tobacco comments:    quit 10 years ago  Vaping Use   Vaping Use: Never used  Substance and Sexual Activity   Alcohol use: Yes    Alcohol/week: 2.0 standard drinks of alcohol    Types: 1 Glasses of wine, 1 Shots of liquor per week    Comment: Once every 3 months    Drug use: No   Sexual activity: Yes    Birth control/protection: Other-see comments    Comment: tubal  Other Topics Concern   Not on file  Social History Narrative   Pt has GED. CNA as well.    Social Determinants of Health   Financial Resource Strain: Not on file  Food Insecurity: Not on file  Transportation Needs: Not on file  Physical Activity: Not on file  Stress: Not on file  Social Connections: Not on file  Intimate Partner Violence: Not on file   Family History  Problem Relation Age of Onset   Hypertension Mother    Schizophrenia Mother    Schizophrenia Sister    Schizophrenia Brother  Schizophrenia Brother    No Known Allergies    Review of Systems  Constitutional: Negative.   HENT: Negative.    Eyes: Negative.   Respiratory:  Negative for shortness of breath.   Cardiovascular:  Negative for chest pain.  Gastrointestinal: Negative.   Genitourinary: Negative.   Musculoskeletal:  Positive for joint pain and myalgias.  Skin:  Positive for itching and rash.  Neurological: Negative.   Endo/Heme/Allergies: Negative.   Psychiatric/Behavioral: Negative.        Objective:     BP 133/87 (BP Location: Left Arm)   Pulse 80   Ht '5\' 4"'$  (1.626 m)   Wt 203 lb (92.1 kg)   SpO2 99%   BMI 34.84 kg/m    Physical Exam Vitals reviewed.  Constitutional:      Appearance: Normal appearance. She is obese.  HENT:     Head: Normocephalic and atraumatic.     Right Ear: External ear normal.     Left Ear: External ear normal.     Nose: Nose normal.     Mouth/Throat:     Mouth: Mucous membranes are moist.      Pharynx: Oropharynx is clear.  Eyes:     Extraocular Movements: Extraocular movements intact.     Conjunctiva/sclera: Conjunctivae normal.     Pupils: Pupils are equal, round, and reactive to light.  Cardiovascular:     Rate and Rhythm: Normal rate and regular rhythm.     Pulses: Normal pulses.          Dorsalis pedis pulses are 2+ on the right side and 2+ on the left side.       Posterior tibial pulses are 2+ on the right side and 2+ on the left side.     Heart sounds: Normal heart sounds.     Comments: Slight bilateral ankle edema noted  Pulmonary:     Effort: Pulmonary effort is normal.     Breath sounds: Normal breath sounds.  Musculoskeletal:     Cervical back: Normal range of motion and neck supple.     Right knee: Normal.     Left knee: Swelling, bony tenderness and crepitus present.     Comments: Slight swelling noted   Skin:    General: Skin is warm.     Findings: Rash present. Rash is scaling. Rash is not pustular or urticarial.     Comments: On right hand near thumb  Neurological:     General: No focal deficit present.     Mental Status: She is alert and oriented to person, place, and time.  Psychiatric:        Mood and Affect: Mood normal.        Behavior: Behavior normal.        Thought Content: Thought content normal.        Judgment: Judgment normal.       Assessment & Plan:   Problem List Items Addressed This Visit       Other   OBESITY, NOS   Knee pain - Primary   Relevant Orders   DG Knee 1-2 Views Left   Other Visit Diagnoses     Ankle edema, bilateral       Essential hypertension       Elevated blood pressure reading in office with diagnosis of hypertension       Dermatitis         1. Acute pain of left knee Patient education given on supportive care, red flags given  for prompt reevaluation. - DG Knee 1-2 Views Left; Future  2. Ankle edema, bilateral Patient encouraged to increase water intake, reduce sodium intake, patient  education given on other supportive care measures,  3. Essential hypertension Patient encouraged to check blood pressure at home, keep a written log and have available for all office visits.  4. Elevated blood pressure reading in office with diagnosis of hypertension   5. Dermatitis Continue Kenalog, patient has refills available.  Patient education given on supportive care.   I have reviewed the patient's medical history (PMH, PSH, Social History, Family History, Medications, and allergies) , and have been updated if relevant. I spent 30 minutes reviewing chart and  face to face time with patient.     Return if symptoms worsen or fail to improve.    Loraine Grip Mayers, PA-C

## 2022-04-04 NOTE — Patient Instructions (Addendum)
To help with your knee pain, you can use ibuprofen 200 to 800 mg every 8 hours.  I encourage you to use a brace, ice can also be very helpful.  We will call you with the results of your x-ray and refer to orthopedics as appropriate.  To help with your rash, you are going to use Kenalog cream, you do have 2 refills available of this at your pharmacy.  Your blood pressure is slightly elevated today, I do encourage you to check your blood pressure at home, keep a written log and have available for all office visits.  I encourage you to increase your water intake you should be drinking at least 64 ounces of water a day.  I encourage you to rest and elevate your feet when you are able.  It is important that you work on self-care and aim for 7 hours of sleep at night.  Please let us know if there is anything else we can do for you  Barbara Rad, PA-C Physician Assistant [place] [email or web address]  Acute Knee Pain, Adult Acute knee pain is sudden and may be caused by damage, swelling, or irritation of the muscles and tissues that support the knee. Pain may result from: A fall. An injury to the knee from twisting motions. A hit to the knee. Infection. Acute knee pain may go away on its own with time and rest. If it does not, your health care provider may order tests to find the cause of the pain. These may include: Imaging tests, such as an X-ray, MRI, CT scan, or ultrasound. Joint aspiration. In this test, fluid is removed from the knee and evaluated. Arthroscopy. In this test, a lighted tube is inserted into the knee and an image is projected onto a TV screen. Biopsy. In this test, a sample of tissue is removed from the body and studied under a microscope. Follow these instructions at home: If you have a knee sleeve or brace:  Wear the knee sleeve or brace as told by your health care provider. Remove it only as told by your health  care provider. Loosen it if your toes tingle, become numb, or turn cold and blue. Keep it clean. If the knee sleeve or brace is not waterproof: Do not let it get wet. Cover it with a watertight covering when you take a bath or shower. Activity Rest your knee. Do not do things that cause pain or make pain worse. Avoid high-impact activities or exercises, such as running, jumping rope, or doing jumping jacks. Work with a physical therapist to make a safe exercise program, as recommended by your health care provider. Do exercises as told by your physical therapist. Managing pain, stiffness, and swelling  If directed, put ice on the affected knee. To do this: If you have a removable knee sleeve or brace, remove it as told by your health care provider. Put ice in a plastic bag. Place a towel between your skin and the bag. Leave the ice on for 20 minutes, 2-3 times a day. Remove the ice if your skin turns bright red. This is very important. If you cannot feel pain, heat, or cold, you have a greater risk of damage to the area. If directed, use an elastic bandage to put pressure (compression) on your injured knee. This may control swelling, give support, and help with discomfort. Raise (elevate) your knee above the level of your heart while you are sitting or lying down. Sleep with a  pillow under your knee. General instructions Take over-the-counter and prescription medicines only as told by your health care provider. Do not use any products that contain nicotine or tobacco, such as cigarettes, e-cigarettes, and chewing tobacco. If you need help quitting, ask your health care provider. If you are overweight, work with your health care provider and a dietitian to set a weight-loss goal that is healthy and reasonable for you. Extra weight can put pressure on your knee. Pay attention to any changes in your symptoms. Keep all follow-up visits. This is important. Contact a health care provider  if: Your knee pain continues, changes, or gets worse. You have a fever along with knee pain. Your knee feels warm to the touch or is red. Your knee buckles or locks up. Get help right away if: Your knee swells, and the swelling becomes worse. You cannot move your knee. You have severe pain in your knee that cannot be managed with pain medicine. Summary Acute knee pain can be caused by a fall, an injury, an infection, or damage, swelling, or irritation of the tissues that support your knee. Your health care provider may perform tests to find out the cause of the pain. Pay attention to any changes in your symptoms. Relieve your pain with rest, medicines, light activity, and the use of ice. Get help right away if your knee swells, you cannot move your knee, or you have severe pain that cannot be managed with medicine. This information is not intended to replace advice given to you by your health care provider. Make sure you discuss any questions you have with your health care provider. Document Revised: 03/23/2020 Document Reviewed: 03/23/2020 Elsevier Patient Education  [place].

## 2022-04-05 NOTE — Addendum Note (Signed)
Addended by: Kennieth Rad on: 04/05/2022 04:54 PM   Modules accepted: Orders

## 2022-04-16 ENCOUNTER — Ambulatory Visit: Payer: 59 | Admitting: Physician Assistant

## 2022-04-16 ENCOUNTER — Encounter: Payer: Self-pay | Admitting: Physician Assistant

## 2022-04-16 DIAGNOSIS — M1712 Unilateral primary osteoarthritis, left knee: Secondary | ICD-10-CM

## 2022-04-16 MED ORDER — LIDOCAINE HCL 1 % IJ SOLN
2.0000 mL | INTRAMUSCULAR | Status: AC | PRN
  Administered 2022-04-16: 2 mL

## 2022-04-16 MED ORDER — METHYLPREDNISOLONE ACETATE 40 MG/ML IJ SUSP
80.0000 mg | INTRAMUSCULAR | Status: AC | PRN
  Administered 2022-04-16: 80 mg via INTRA_ARTICULAR

## 2022-04-16 MED ORDER — BUPIVACAINE HCL 0.25 % IJ SOLN
2.0000 mL | INTRAMUSCULAR | Status: AC | PRN
  Administered 2022-04-16: 2 mL via INTRA_ARTICULAR

## 2022-04-17 ENCOUNTER — Other Ambulatory Visit (HOSPITAL_COMMUNITY): Payer: Self-pay

## 2022-05-01 ENCOUNTER — Other Ambulatory Visit (HOSPITAL_COMMUNITY): Payer: Self-pay

## 2022-05-03 ENCOUNTER — Ambulatory Visit: Payer: 59 | Attending: Physician Assistant | Admitting: Physician Assistant

## 2022-05-03 ENCOUNTER — Other Ambulatory Visit (HOSPITAL_COMMUNITY): Payer: Self-pay

## 2022-05-03 ENCOUNTER — Encounter: Payer: Self-pay | Admitting: Physician Assistant

## 2022-05-03 VITALS — BP 117/80 | HR 71 | Wt 196.6 lb

## 2022-05-03 DIAGNOSIS — M62838 Other muscle spasm: Secondary | ICD-10-CM

## 2022-05-03 DIAGNOSIS — N200 Calculus of kidney: Secondary | ICD-10-CM

## 2022-05-03 DIAGNOSIS — R109 Unspecified abdominal pain: Secondary | ICD-10-CM

## 2022-05-03 DIAGNOSIS — I10 Essential (primary) hypertension: Secondary | ICD-10-CM | POA: Diagnosis not present

## 2022-05-03 MED ORDER — LIDOCAINE 5 % EX PTCH
1.0000 | MEDICATED_PATCH | CUTANEOUS | 0 refills | Status: DC
  Filled 2022-05-03: qty 30, 30d supply, fill #0

## 2022-05-03 MED ORDER — LISINOPRIL 20 MG PO TABS
20.0000 mg | ORAL_TABLET | Freq: Every day | ORAL | 6 refills | Status: DC
  Filled 2022-05-03 – 2022-05-17 (×2): qty 30, 30d supply, fill #0
  Filled 2022-06-20: qty 30, 30d supply, fill #1
  Filled 2022-07-16: qty 30, 30d supply, fill #2
  Filled 2022-08-13: qty 30, 30d supply, fill #3
  Filled 2022-09-14: qty 30, 30d supply, fill #4
  Filled 2022-10-12: qty 30, 30d supply, fill #5
  Filled 2022-11-09: qty 30, 30d supply, fill #6

## 2022-05-03 MED ORDER — AMLODIPINE BESYLATE 5 MG PO TABS
5.0000 mg | ORAL_TABLET | Freq: Every day | ORAL | 6 refills | Status: DC
  Filled 2022-05-03 – 2022-05-17 (×2): qty 30, 30d supply, fill #0
  Filled 2022-06-20: qty 30, 30d supply, fill #1
  Filled 2022-07-16: qty 30, 30d supply, fill #2

## 2022-05-03 MED ORDER — METHOCARBAMOL 500 MG PO TABS
1000.0000 mg | ORAL_TABLET | Freq: Three times a day (TID) | ORAL | 0 refills | Status: DC | PRN
  Filled 2022-05-03: qty 90, 15d supply, fill #0

## 2022-05-03 MED ORDER — TAMSULOSIN HCL 0.4 MG PO CAPS
0.4000 mg | ORAL_CAPSULE | Freq: Every day | ORAL | 3 refills | Status: DC
  Filled 2022-05-03: qty 30, 30d supply, fill #0

## 2022-05-03 NOTE — Patient Instructions (Signed)
Kidney Stones Kidney stones are rock-like masses that form inside of the kidneys. Kidneys are organs that make pee (urine). A kidney stone may move into other parts of the urinary tract, including: The tubes that connect the kidneys to the bladder (ureters). The bladder. The tube that carries urine out of the body (urethra). Kidney stones can cause very bad pain and can block the flow of pee. The stone usually leaves your body (passes) through your pee. You may need to have a doctor take out the stone. What are the causes? Kidney stones may be caused by: A condition in which certain glands make too much parathyroid hormone (primary hyperparathyroidism). A buildup of a type of crystals in the bladder made of a chemical called uric acid. The body makes uric acid when you eat certain foods. Narrowing (stricture) of one or both of the ureters. A kidney blockage that you were born with. Past surgery on the kidney or the ureters, such as gastric bypass surgery. What increases the risk? You are more likely to develop this condition if: You have had a kidney stone in the past. You have a family history of kidney stones. You do not drink enough water. You eat a diet that is high in protein, salt (sodium), or sugar. You are overweight or very overweight (obese). What are the signs or symptoms? Symptoms of a kidney stone may include: Pain in the side of the belly, right below the ribs (flank pain). Pain usually spreads (radiates) to the groin. Needing to pee often or right away (urgently). Pain when going pee (urinating). Blood in your pee (hematuria). Feeling like you may vomit (nauseous). Vomiting. Fever and chills. How is this treated? Treatment depends on the size, location, and makeup of the kidney stones. The stones will often pass out of the body through peeing. You may need to: Drink more fluid to help pass the stone. In some cases, you may be given fluids through an IV tube put into one  of your veins at the hospital. Take medicine for pain. Make changes in your diet to help keep kidney stones from coming back. Sometimes, medical procedures are needed to remove a kidney stone. This may involve: A procedure to break up kidney stones using a beam of light (laser) or shock waves. Surgery to remove the kidney stones. Follow these instructions at home: Medicines Take over-the-counter and prescription medicines only as told by your doctor. Ask your doctor if the medicine prescribed to you requires you to avoid driving or using heavy machinery. Eating and drinking Drink enough fluid to keep your pee pale yellow. You may be told to drink at least 8-10 glasses of water each day. This will help you pass the stone. If told by your doctor, change your diet. This may include: Limiting how much salt you eat. Eating more fruits and vegetables. Limiting how much meat, poultry, fish, and eggs you eat. Follow instructions from your doctor about eating or drinking restrictions. General instructions Collect pee samples as told by your doctor. You may need to collect a pee sample: 24 hours after a stone comes out. 8-12 weeks after a stone comes out, and every 6-12 months after that. Strain your pee every time you pee (urinate), for as long as told. Use the strainer that your doctor recommends. Do not throw out the stone. Keep it so that it can be tested by your doctor. Keep all follow-up visits as told by your doctor. This is important. You may need   follow-up tests. How is this prevented? To prevent another kidney stone: Drink enough fluid to keep your pee pale yellow. This is the best way to prevent kidney stones. Eat healthy foods. Avoid certain foods as told by your doctor. You may be told to eat less protein. Stay at a healthy weight. Where to find more information [place] [facility]): www.kidney.[facility]): www.urologyhealth.org Contact a doctor  if: You have pain that gets worse or does not get better with medicine. Get help right away if: You have a fever or chills. You get very bad pain. You get new pain in your belly (abdomen). You pass out (faint). You cannot pee. Summary Kidney stones are rock-like masses that form inside of the kidneys. Kidney stones can cause very bad pain and can block the flow of pee. The stones will often pass out of the body through peeing. Drink enough fluid to keep your pee pale yellow. This information is not intended to replace advice given to you by your health care provider. Make sure you discuss any questions you have with your health care provider. Document Revised: 06/12/2021 Document Reviewed: 06/12/2021 Elsevier Patient Education  [place].

## 2022-05-03 NOTE — Progress Notes (Signed)
Patient ID: Barbara Dunn, female   DOB: 31-Dec-1970, 51 y.o.   MRN: [id number]   Barbara Dunn, is a 51 y.o. female  MGQ:[id number]  [id number]:[id number]  DOB - 11-25-1970  Chief Complaint  Patient presents with   Flank Pain       Subjective:   Barbara Dunn is a 51 y.o. female here today for continued R flank pain.  She feels this related to a kidney stone she has had for a while.  However, the pain is constant and daily.  Describes as aching.  Saw nephrology a couple of months ago and they told her to wait and watch bc it was not acutely trying to pass and not large enough for intervention.  She is to see them back in about 3-4 months from now.  No hematuria.  No N/V.  Bowels moving normally  No problems updated.  ALLERGIES: No Known Allergies  PAST MEDICAL HISTORY: Past Medical History:  Diagnosis Date   Anemia    Arthritis    Bronchitis    Hypertension    Migraines    Obesity     MEDICATIONS AT HOME: Prior to Admission medications   Medication Sig Start Date End Date Taking? Authorizing Provider  aspirin 81 MG tablet Take 81 mg by mouth daily.   Yes [provider]  BIOTIN PO Take 2 tablets by mouth daily.    Yes [provider]  cetirizine (ZYRTEC) 10 MG tablet Take 10 mg by mouth daily.   Yes [provider]  docusate sodium (COLACE) 100 MG capsule Take 100 mg by mouth as needed.    Yes [provider]  fluticasone (FLONASE) 50 MCG/ACT nasal spray PLACE 2 SPRAYS INTO BOTH NOSTRILS DAILY. 01/11/20  Yes Charlott Rakes, MD  meclizine (ANTIVERT) 25 MG tablet Take 1 tablet (25 mg total) by mouth 3 (three) times daily as needed for dizziness. 07/10/18  Yes Charlott Rakes, MD  methocarbamol (ROBAXIN) 500 MG tablet Take 2 tablets (1,000 mg total) by mouth every 8 (eight) hours as needed for muscle spasms. 05/03/22  Yes Jonathen Rathman, Dionne Bucy, PA-C  Multiple Vitamins-Minerals (MULTIVITAMIN ADULT PO) Take 1 tablet by mouth daily.    Yes [provider]   oxymetazoline (AFRIN NASAL SPRAY) 0.05 % nasal spray Place 1 spray into both nostrils 2 (two) times daily. 10/31/21  Yes Newlin, Charlane Ferretti, MD  SUMAtriptan (IMITREX) 50 MG tablet TAKE 2 TABLETS (100 MG TOTAL) BY MOUTH ONCE FOR 1 DOSE. MAY REPEAT IN 2 HOURS IF HEADACHE PERSISTS, MAX 4 TABLETS/DAY 10/31/21 10/31/22 Yes Newlin, Enobong, MD  tamsulosin (FLOMAX) 0.4 MG CAPS capsule Take 1 capsule (0.4 mg total) by mouth daily. 05/03/22  Yes Aamilah Augenstein, Dionne Bucy, PA-C  triamcinolone cream (KENALOG) 0.1 % APPLY 1 APPLICATION TOPICALLY 2 (TWO) TIMES DAILY. 01/03/22  Yes Newlin, Enobong, MD  albuterol (VENTOLIN HFA) 108 (90 Base) MCG/ACT inhaler INHALE 1 PUFF INTO THE LUNGS EVERY 6 HOURS AS NEEDED FOR WHEEZING OR SHORTNESS OF BREATH. 02/01/20 01/31/21  Charlott Rakes, MD  amLODipine (NORVASC) 5 MG tablet Take 1 tablet (5 mg total) by mouth daily. 05/03/22   Argentina Donovan, PA-C  benzonatate (TESSALON) 100 MG capsule TAKE 1 CAPSULE (100 MG TOTAL) BY MOUTH 3 (THREE) TIMES DAILY. Patient not taking: Reported on 05/03/2022 01/11/20   Charlott Rakes, MD  chlorhexidine (PERIDEX) 0.12 % solution Use as directed 5 mLs in the mouth or throat as needed. Patient not taking: Reported on 05/03/2022    [provider]  chlorpheniramine-HYDROcodone (TUSSIONEX  PENNKINETIC ER) 10-8 MG/5ML SUER Take 5 mLs by mouth every 12 (twelve) hours as needed for cough. Patient not taking: Reported on 05/03/2022 03/13/21   Charlott Rakes, MD  fluticasone (FLOVENT HFA) 110 MCG/ACT inhaler INHALE 1 PUFF INTO THE LUNGS 2 (TWO) TIMES DAILY. 11/08/20 11/08/21  Charlott Rakes, MD  HYDROcodone-homatropine (HYCODAN) 5-1.5 MG/5ML syrup Take 5 mLs by mouth every 6 (six) hours as needed for cough. Patient not taking: Reported on 05/03/2022 12/15/19   Vanessa Kick, MD  lidocaine (LIDODERM) 5 % Place 1 patch onto the skin daily. Remove & Discard patch within 12 hours or as directed by MD 05/03/22   Argentina Donovan, PA-C  lisinopril (ZESTRIL) 20 MG  tablet Take 1 tablet (20 mg total) by mouth daily. 05/03/22   Argentina Donovan, PA-C  norgestimate-ethinyl estradiol [facility]) 0.25-35 MG-MCG tablet Take 1 tablet by mouth daily. Patient not taking: Reported on 05/03/2022 12/14/21   Charlott Rakes, MD  ondansetron (ZOFRAN) 4 MG tablet Take 1 tablet (4 mg total) by mouth every 6 (six) hours. Patient not taking: Reported on 05/03/2022 12/07/19   Raylene Everts, MD  topiramate (TOPAMAX) 50 MG tablet Take 1 tablet (50 mg total) by mouth 2 (two) times daily. Patient not taking: Reported on 05/03/2022 10/31/21   Charlott Rakes, MD  topiramate (TOPAMAX) 50 MG tablet Take 1 tablet (50 mg total) by mouth 2 (two) times daily. Patient not taking: Reported on 01/24/2022 10/31/21   Charlott Rakes, MD  diphenhydrAMINE (BENADRYL) 25 mg capsule Take 25 mg by mouth every 6 (six) hours as needed for itching. Reported on 01/23/2016  12/07/19  [provider]    ROS: Neg HEENT Neg resp Neg cardiac Neg GI Neg psych Neg neuro  Objective:   Vitals:   05/03/22 1438  BP: 117/80  Pulse: 71  SpO2: 100%  Weight: 196 lb 9.6 oz (89.2 kg)   Exam General appearance : Awake, alert, not in any distress. Speech Clear. Not toxic looking HEENT: Atraumatic and Normocephalic Neck: Supple, no JVD. No cervical lymphadenopathy.  Chest: Good air entry bilaterally, CTAB.  No rales/rhonchi/wheezing CVS: S1 S2 regular, no murmurs.  Abdomen: Bowel sounds present, Non tender and not distended with no gaurding, rigidity or rebound.  No CVA TTP Extremities: B/L Lower Ext shows no edema, both legs are warm to touch Neurology: Awake alert, and oriented X 3, CN II-XII intact, Non focal Skin: No Rash  Data Review Lab Results  Component Value Date   HGBA1C 5.4 12/26/2021   HGBA1C 5.4 11/08/2020   HGBA1C 5.3 01/01/2018    Assessment & Plan   1. Essential hypertension Controlled-continue - lisinopril (ZESTRIL) 20 MG tablet; Take 1 tablet (20 mg total) by mouth daily.   Dispense: 30 tablet; Refill: 6 - amLODipine (NORVASC) 5 MG tablet; Take 1 tablet (5 mg total) by mouth daily.  Dispense: 30 tablet; Refill: 6  2. Flank pain Increase water intake.  Strain urine.  I don't think the pain is necessarily coming from her kidneys but is more likely musculoskeletal.  She can try flomax - tamsulosin (FLOMAX) 0.4 MG CAPS capsule; Take 1 capsule (0.4 mg total) by mouth daily.  Dispense: 30 capsule; Refill: 3 - lidocaine (LIDODERM) 5 %; Place 1 patch onto the skin daily. Remove & Discard patch within 12 hours or as directed by MD  Dispense: 30 patch; Refill: 0  3. Muscle spasm - methocarbamol (ROBAXIN) 500 MG tablet; Take 2 tablets (1,000 mg total) by mouth every 8 (eight)  hours as needed for muscle spasms.  Dispense: 90 tablet; Refill: 0  4. Kidney stone - tamsulosin (FLOMAX) 0.4 MG CAPS capsule; Take 1 capsule (0.4 mg total) by mouth daily.  Dispense: 30 capsule; Refill: 3 No acute pain   Return in about 6 months (around 11/03/2022) for PCP for chronic conditions.  The patient was given clear instructions to go to ER or return to medical center if symptoms don't improve, worsen or new problems develop. The patient verbalized understanding. The patient was told to call to get lab results if they haven't heard anything in the next week.      Freeman Caldron, PA-C [facility] [place], [place]   05/03/2022, 2:58 PM

## 2022-05-07 ENCOUNTER — Other Ambulatory Visit (HOSPITAL_COMMUNITY): Payer: Self-pay

## 2022-05-07 ENCOUNTER — Other Ambulatory Visit: Payer: Self-pay | Admitting: Family Medicine

## 2022-05-07 DIAGNOSIS — R42 Dizziness and giddiness: Secondary | ICD-10-CM

## 2022-05-08 ENCOUNTER — Other Ambulatory Visit (HOSPITAL_COMMUNITY): Payer: Self-pay

## 2022-05-08 ENCOUNTER — Telehealth: Payer: Self-pay | Admitting: Family Medicine

## 2022-05-08 ENCOUNTER — Ambulatory Visit: Payer: Self-pay | Admitting: *Deleted

## 2022-05-08 DIAGNOSIS — R42 Dizziness and giddiness: Secondary | ICD-10-CM

## 2022-05-08 MED ORDER — MECLIZINE HCL 25 MG PO TABS
25.0000 mg | ORAL_TABLET | Freq: Three times a day (TID) | ORAL | 1 refills | Status: DC | PRN
  Filled 2022-05-08 – 2022-05-09 (×2): qty 60, 20d supply, fill #0

## 2022-05-08 NOTE — Telephone Encounter (Signed)
Routing to pcp for review 

## 2022-05-08 NOTE — Telephone Encounter (Signed)
Requested medication (s) are due for refill today: yes  Requested medication (s) are on the active medication list:yes  Last refill:  07/10/2018  Future visit scheduled: yes  Notes to clinic:  Unable to refill per protocol, cannot delegate.      Requested Prescriptions  Pending Prescriptions Disp Refills   meclizine (ANTIVERT) 25 MG tablet 60 tablet 1    Sig: Take 1 tablet (25 mg total) by mouth 3 (three) times daily as needed for dizziness.     Not Delegated - Gastroenterology: Antiemetics Failed - 05/07/2022  7:38 AM      Failed - This refill cannot be delegated      Passed - Valid encounter within last 6 months    Recent Outpatient Visits           5 days ago Muscle spasm   [facility] [place], [place], [place]   6 months ago Left upper quadrant abdominal pain   [place], Enobong, MD   9 months ago Flank pain   [facility] [place] [place], [place], [place]   1 year ago Flank pain   [facility] Charlott Rakes, MD   1 year ago Screening for diabetes mellitus   [place], Enobong, MD       Future Appointments             Tomorrow Buford Dresser, MD [facility]   In 2 months Charlott Rakes, MD [place]

## 2022-05-08 NOTE — Telephone Encounter (Signed)
Medication Refill - Medication: meclizine (ANTIVERT) 25 MG tablet   Has the patient contacted their pharmacy? Yes.   (Agent: If no, request that the patient contact the pharmacy for the refill. If patient does not wish to contact the pharmacy document the reason why and proceed with request.) (Agent: If yes, when and what did the pharmacy advise?)Rx out of date / new RX needed / call pcp  Preferred Pharmacy (with phone number or street name): [facility] Has the patient been seen for an appointment in the last year OR does the patient have an upcoming appointment? Yes.    Agent: Please be advised that RX refills may take up to 3 business days. We ask that you follow-up with your pharmacy.

## 2022-05-08 NOTE — Telephone Encounter (Signed)
Refilled

## 2022-05-08 NOTE — Telephone Encounter (Signed)
  Chief Complaint: Vertigo Symptoms: "Spinning" comes and goes, states  H/O and meclizine helps "Dr.will call it in for me when I have this." Worse with sitting to standing, turning head, when outside. Able to walk with assist. Frequency: Weekend Pertinent Negatives: Patient denies  Disposition: '[]'$ ED /'[]'$ Urgent Care (no appt availability in office) / '[]'$ Appointment(In office/virtual)/ '[]'$  [facility] [facility]/ '[]'$ Home Care/ '[]'$ Refused Recommended Disposition /'[]'$ [place] [facility]/ '[x]'$  Follow-up with PCP Additional Notes: Requesting refill of Meclizine. Care advise provided, verbalizes understanding.  Please advise. Thank you. Reason for Disposition  [1] MODERATE dizziness (e.g., vertigo; feels very unsteady, interferes with normal activities) AND [2] has been evaluated by doctor (or NP/PA) for this  Answer Assessment - Initial Assessment Questions 1. DESCRIPTION: "Describe your dizziness."     Spinning 2. VERTIGO: "Do you feel like either you or the room is spinning or tilting?"      Over weekend 3. LIGHTHEADED: "Do you feel lightheaded?" (e.g., somewhat faint, woozy, weak upon standing)     yes 4. SEVERITY: "How bad is it?"  "Can you walk?"   - MILD: Feels slightly dizzy and unsteady, but is walking normally.   - MODERATE: Feels unsteady when walking, but not falling; interferes with normal activities (e.g., school, work).   - SEVERE: Unable to walk without falling, or requires assistance to walk without falling.     Mild- moderate 5. ONSET:  "When did the dizziness begin?"     Over weekend 6. AGGRAVATING FACTORS: "Does anything make it worse?" (e.g., standing, change in head position)     Yes, turning head and when outside. 7. CAUSE: "What do you think is causing the dizziness?"     Dieting 8. RECURRENT SYMPTOM: "Have you had dizziness before?" If Yes, ask: "When was the last time?" "What happened that time?"     Yes 9. OTHER SYMPTOMS: "Do you have any other symptoms?" (e.g.,  headache, weakness, numbness, vomiting, earache)     No  Protocols used: Dizziness - Vertigo-A-AH

## 2022-05-08 NOTE — Telephone Encounter (Signed)
-----   Message from Sharene Skeans sent at 05/08/2022  4:19 PM EDT -----  Pt stated she has been experiencing dizziness she thinks is from vertigo and asked for a refill for an old RX / pt states she has experienced the dizziness of anf on today and needs the Rx / Rx request was sent to office yesterday / please advise   Attempted to call patient- left message to call office- medication is pended for provider review- non delegated Rx

## 2022-05-09 ENCOUNTER — Ambulatory Visit (HOSPITAL_BASED_OUTPATIENT_CLINIC_OR_DEPARTMENT_OTHER): Payer: 59 | Admitting: Cardiology

## 2022-05-09 ENCOUNTER — Encounter (HOSPITAL_BASED_OUTPATIENT_CLINIC_OR_DEPARTMENT_OTHER): Payer: Self-pay | Admitting: Cardiology

## 2022-05-09 ENCOUNTER — Other Ambulatory Visit: Payer: Self-pay

## 2022-05-09 ENCOUNTER — Other Ambulatory Visit (HOSPITAL_COMMUNITY): Payer: Self-pay

## 2022-05-09 VITALS — BP 100/70 | HR 79 | Ht 64.0 in | Wt 196.0 lb

## 2022-05-09 DIAGNOSIS — I10 Essential (primary) hypertension: Secondary | ICD-10-CM | POA: Diagnosis not present

## 2022-05-09 DIAGNOSIS — I471 Supraventricular tachycardia: Secondary | ICD-10-CM

## 2022-05-09 DIAGNOSIS — Z7189 Other specified counseling: Secondary | ICD-10-CM

## 2022-05-09 DIAGNOSIS — Z8616 Personal history of COVID-19: Secondary | ICD-10-CM | POA: Diagnosis not present

## 2022-05-09 DIAGNOSIS — R0789 Other chest pain: Secondary | ICD-10-CM

## 2022-05-09 NOTE — Progress Notes (Signed)
Cardiology Office Note:    Date:  05/09/2022   ID:  Barbara Dunn, DOB January 08, 1971, MRN [id number]  PCP:  Charlott Rakes, MD  Cardiologist:  Buford Dresser, MD PhD  Referring MD: Charlott Rakes, MD   CC: follow up  History of Present Illness:    Barbara Dunn is a 51 y.o. female with a hx of hypertension, migraine, SVT seen during cath, noncardiac chest pain, palpitations who is seen for follow up today.   Today, she overall appears well, and says that things have been going well. She occasionally experiences chest pain, but adds that her angina is not severe. It lasts for a couple of minutes. Reports that she has not had any palpitations. Exercises regularly and wants to be around 165 lbs by her birthday. She has been eating well and avoids having food past 7:00 pm. Has been assisting her Mom lately and plans to have her in hospice since she is unable to ambulate. She has been unable to work which has brought her stress as well.  Denies shortness of breath at rest or with normal exertion. No PND, orthopnea, LE edema or unexpected weight gain. No syncope or palpitations.   Past Medical History:  Diagnosis Date   Anemia    Arthritis    Bronchitis    Hypertension    Migraines    Obesity     Past Surgical History:  Procedure Laterality Date   CARDIAC CATHETERIZATION N/A 08/02/2015   Procedure: Left Heart Cath and Coronary Angiography;  Surgeon: Troy Sine, MD;  Location: [facility];  Service: Cardiovascular;  Laterality: N/A;   CERVIX LESION DESTRUCTION     TUBAL LIGATION     TUBAL LIGATION      Current Medications: Current Outpatient Medications on File Prior to Visit  Medication Sig   albuterol (VENTOLIN HFA) 108 (90 Base) MCG/ACT inhaler INHALE 1 PUFF INTO THE LUNGS EVERY 6 HOURS AS NEEDED FOR WHEEZING OR SHORTNESS OF BREATH.   amLODipine (NORVASC) 5 MG tablet Take 1 tablet (5 mg total) by mouth daily.   aspirin 81 MG tablet Take 81 mg by mouth daily.    benzonatate (TESSALON) 100 MG capsule TAKE 1 CAPSULE (100 MG TOTAL) BY MOUTH 3 (THREE) TIMES DAILY. (Patient not taking: Reported on 05/03/2022)   BIOTIN PO Take 2 tablets by mouth daily.    cetirizine (ZYRTEC) 10 MG tablet Take 10 mg by mouth daily.   chlorhexidine (PERIDEX) 0.12 % solution Use as directed 5 mLs in the mouth or throat as needed. (Patient not taking: Reported on 05/03/2022)   chlorpheniramine-HYDROcodone (TUSSIONEX PENNKINETIC ER) 10-8 MG/5ML SUER Take 5 mLs by mouth every 12 (twelve) hours as needed for cough. (Patient not taking: Reported on 05/03/2022)   docusate sodium (COLACE) 100 MG capsule Take 100 mg by mouth as needed.    fluticasone (FLONASE) 50 MCG/ACT nasal spray PLACE 2 SPRAYS INTO BOTH NOSTRILS DAILY.   fluticasone (FLOVENT HFA) 110 MCG/ACT inhaler INHALE 1 PUFF INTO THE LUNGS 2 (TWO) TIMES DAILY.   HYDROcodone-homatropine (HYCODAN) 5-1.5 MG/5ML syrup Take 5 mLs by mouth every 6 (six) hours as needed for cough. (Patient not taking: Reported on 05/03/2022)   lidocaine (LIDODERM) 5 % Place 1 patch onto the skin daily. Remove & Discard patch within 12 hours or as directed by MD   lisinopril (ZESTRIL) 20 MG tablet Take 1 tablet (20 mg total) by mouth daily.   meclizine (ANTIVERT) 25 MG tablet Take 1 tablet (25 mg total) by  mouth 3 (three) times daily as needed for dizziness.   methocarbamol (ROBAXIN) 500 MG tablet Take 2 tablets (1,000 mg total) by mouth every 8 (eight) hours as needed for muscle spasms.   Multiple Vitamins-Minerals (MULTIVITAMIN ADULT PO) Take 1 tablet by mouth daily.    norgestimate-ethinyl estradiol (FEMYNOR) 0.25-35 MG-MCG tablet Take 1 tablet by mouth daily. (Patient not taking: Reported on 05/03/2022)   ondansetron (ZOFRAN) 4 MG tablet Take 1 tablet (4 mg total) by mouth every 6 (six) hours. (Patient not taking: Reported on 05/03/2022)   oxymetazoline (AFRIN NASAL SPRAY) 0.05 % nasal spray Place 1 spray into both nostrils 2 (two) times daily.   SUMAtriptan  (IMITREX) 50 MG tablet TAKE 2 TABLETS (100 MG TOTAL) BY MOUTH ONCE FOR 1 DOSE. MAY REPEAT IN 2 HOURS IF HEADACHE PERSISTS, MAX 4 TABLETS/DAY   tamsulosin (FLOMAX) 0.4 MG CAPS capsule Take 1 capsule (0.4 mg total) by mouth daily.   topiramate (TOPAMAX) 50 MG tablet Take 1 tablet (50 mg total) by mouth 2 (two) times daily. (Patient not taking: Reported on 05/03/2022)   topiramate (TOPAMAX) 50 MG tablet Take 1 tablet (50 mg total) by mouth 2 (two) times daily. (Patient not taking: Reported on 01/24/2022)   triamcinolone cream (KENALOG) 0.1 % APPLY 1 APPLICATION TOPICALLY 2 (TWO) TIMES DAILY.   [DISCONTINUED] diphenhydrAMINE (BENADRYL) 25 mg capsule Take 25 mg by mouth every 6 (six) hours as needed for itching. Reported on 01/23/2016   No current facility-administered medications on file prior to visit.     Allergies:   Patient has no known allergies.   Social History   Tobacco Use   Smoking status: Former    Types: Cigarettes    Quit date: 12/15/2003    Years since quitting: 18.4   Smokeless tobacco: Never   Tobacco comments:    quit 10 years ago  Vaping Use   Vaping Use: Never used  Substance Use Topics   Alcohol use: Yes    Alcohol/week: 2.0 standard drinks of alcohol    Types: 1 Glasses of wine, 1 Shots of liquor per week    Comment: Once every 3 months    Drug use: No    Family History: The patient's family history includes Hypertension in her mother; Schizophrenia in her brother, brother, mother, and sister. aunt had heart disease. Mom's side has HTN, DM as well  ROS:   Please see the history of present illness. All other systems are reviewed and negative.    EKGs/Labs/Other Studies Reviewed:    The following studies were reviewed today: Lexiscan myoview 06/2015 The left ventricular ejection fraction is hyperdynamic (>65%). Nuclear stress EF: 71%. There was no ST segment deviation noted during stress. This is a low risk study.   Low risk stress nuclear study with a small,  severe, predominantly fixed apical defect likely representing apical thinning; cannot R/O minimal apical ischemia; EF 71 with normal wall motion.  Heart cath 07/2015 The left ventricular systolic function is normal. Normal coronary arteries. Transient recurrent episodes of SVT, catheter induced which would break with coughing.   Normal LV function without focal segmental wall motion M normalities. Normal coronary arteries.   Recommendation: Suspect nonischemic chest pain.  Medical therapy for hypertension.  The patient may benefit from beta blocker therapy with history of palpitations and demonstrated recurrent bursts of SVT easily inducible during the procedure; alternatively, adjustment from amlodipine to either Cardizem/verapamil may be considered.  EKG:  EKG is personally reviewed.   05/09/2022 EKG: Rate  79. Sinus rhythm with sinus Arrythmia. 03/01/2020: sinus rhythm with sinus arrhythmia at 78 bpm.  Recent Labs: 12/26/2021: ALT 15; BUN 9; Creatinine, Ser 0.97; Hemoglobin 12.7; Platelets 247; Potassium 4.2; Sodium 136  Recent Lipid Panel    Component Value Date/Time   CHOL 193 12/26/2021 0835   TRIG 66 12/26/2021 0835   HDL 97 12/26/2021 0835   CHOLHDL 2.1 11/02/2019 1009   CHOLHDL 1.8 07/27/2016 0917   VLDL 14 07/27/2016 0917   LDLCALC 84 12/26/2021 0835    Physical Exam:    VS:  BP 100/70   Pulse 79   Ht '5\' 4"'$  (1.626 m)   Wt 196 lb (88.9 kg)   BMI 33.64 kg/m     Wt Readings from Last 3 Encounters:  05/09/22 196 lb (88.9 kg)  05/03/22 196 lb 9.6 oz (89.2 kg)  04/04/22 203 lb (92.1 kg)    GEN: Well nourished, well developed in no acute distress HEENT: Normal, moist mucous membranes NECK: No JVD CARDIAC: regular rhythm, normal S1 and S2, no rubs or gallops. No murmur. VASCULAR: Radial and DP pulses 2+ bilaterally. No carotid bruits RESPIRATORY:  Clear to auscultation without rales, wheezing or rhonchi  ABDOMEN: Soft, non-tender, non-distended MUSCULOSKELETAL:   Ambulates independently SKIN: Warm and dry, no edema NEUROLOGIC:  Alert and oriented x 3. No focal neuro deficits noted. PSYCHIATRIC:  Normal affect   ASSESSMENT:    1. Primary hypertension   2. Non-cardiac chest pain   3. Paroxysmal SVT (supraventricular tachycardia) ([place])   4. Personal history of COVID-19   5. Cardiac risk counseling     PLAN:    Chest pain: noncardiac based on evaluation -Workup, including labs, monitor, cath, echo have not shown serious cardiac abnormalities. Cath was without CAD in 2016.   Paroxysmal SVT -seen during cath in 2016 and one episode on monitor -events are very brief, rare, and self limited.   Personal history of Covid-19 infection: no clear long term sequelae   Hypertension: at goal today -continue amlodipine, lisinopril   Primary prevention: -recommend heart healthy/Mediterranean diet, with whole grains, fruits, vegetable, fish, lean meats, nuts, and olive oil. Limit salt. -recommend moderate walking, 3-5 times/week for 30-50 minutes each session. Aim for at least 150 minutes.week. Goal should be pace of 3 miles/hours, or walking 1.5 miles in 30 minutes -recommend avoidance of tobacco products. Avoid excess alcohol. -given age, low risk, does not need to be on aspirin from a cardiac standpoint. Have discussed previously -ASCVD risk score: The 10-year ASCVD risk score (Arnett DK, et al., 2019) is: 0.9%   Values used to calculate the score:     Age: 40 years     Sex: Female     Is Non-Hispanic African American: Yes     Diabetic: No     Tobacco smoker: No     Systolic Blood Pressure: 341 mmHg     Is BP treated: Yes     HDL Cholesterol: 97 mg/dL     Total Cholesterol: 193 mg/dL   Plan for follow up: 2 years or sooner as needed  Medication Adjustments/Labs and Tests Ordered: Current medicines are reviewed at length with the patient today.  Concerns regarding medicines are outlined above.  Orders Placed This Encounter  Procedures   EKG  12-Lead   No orders of the defined types were placed in this encounter.   Patient Instructions  Medication Instructions:  Your Physician recommend you continue on your current medication as directed.    *If you  need a refill on your cardiac medications before your next appointment, please call your pharmacy*   Lab Work: None ordered today   Testing/Procedures: None ordered today    Follow-Up: At [facility], you and your health needs are our priority.  As part of our continuing mission to provide you with exceptional heart care, we have created designated Provider Care Teams.  These Care Teams include your primary Cardiologist (physician) and Advanced Practice Providers (APPs -  Physician Assistants and Nurse Practitioners) who all work together to provide you with the care you need, when you need it.  We recommend signing up for the patient portal called "MyChart".  Sign up information is provided on this After Visit Summary.  MyChart is used to connect with patients for Virtual Visits (Telemedicine).  Patients are able to view lab/test results, encounter notes, upcoming appointments, etc.  Non-urgent messages can be sent to your provider as well.   To learn more about what you can do with MyChart, go to [email or web address].    Your next appointment:   2 year(s)  The format for your next appointment:   In Person  Provider:   Buford Dresser, MD{         I,Tinashe Williams,acting as a scribe for Buford Dresser, MD.,have documented all relevant documentation on the behalf of Buford Dresser, MD,as directed by  Buford Dresser, MD while in the presence of Buford Dresser, MD.   I, Buford Dresser, MD, have reviewed all documentation for this visit. The documentation on 06/17/22 for the exam, diagnosis, procedures, and orders are all accurate and complete.   Buford Dresser, MD, PhD, [facility] at [facility] at [facility] [address], [place], [place] [address] [phone]

## 2022-05-09 NOTE — Patient Instructions (Signed)
Medication Instructions:  Your Physician recommend you continue on your current medication as directed.    *If you need a refill on your cardiac medications before your next appointment, please call your pharmacy*   Lab Work: None ordered today   Testing/Procedures: None ordered today    Follow-Up: At [facility], you and your health needs are our priority.  As part of our continuing mission to provide you with exceptional heart care, we have created designated Provider Care Teams.  These Care Teams include your primary Cardiologist (physician) and Advanced Practice Providers (APPs -  Physician Assistants and Nurse Practitioners) who all work together to provide you with the care you need, when you need it.  We recommend signing up for the patient portal called "MyChart".  Sign up information is provided on this After Visit Summary.  MyChart is used to connect with patients for Virtual Visits (Telemedicine).  Patients are able to view lab/test results, encounter notes, upcoming appointments, etc.  Non-urgent messages can be sent to your provider as well.   To learn more about what you can do with MyChart, go to [email or web address].    Your next appointment:   2 year(s)  The format for your next appointment:   In Person  Provider:   Buford Dresser, MD{

## 2022-05-10 ENCOUNTER — Other Ambulatory Visit (HOSPITAL_COMMUNITY): Payer: Self-pay

## 2022-05-10 NOTE — Telephone Encounter (Signed)
Called pt and she received medication

## 2022-05-11 NOTE — Telephone Encounter (Signed)
Noted pt was called yesterday and received med

## 2022-05-17 ENCOUNTER — Other Ambulatory Visit (HOSPITAL_COMMUNITY): Payer: Self-pay

## 2022-05-21 ENCOUNTER — Ambulatory Visit: Payer: Self-pay | Admitting: *Deleted

## 2022-05-21 NOTE — Telephone Encounter (Signed)
  Chief Complaint: on period a week early Symptoms: period Frequency: one week ealy Pertinent Negatives: Patient denies missing bc pills Disposition: '[]'$ ED /'[]'$ Urgent Care (no appt availability in office) / '[]'$ Appointment(In office/virtual)/ '[]'$  [facility]/ '[x]'$ Home Care/ '[]'$ Refused Recommended Disposition /'[]'$ [place] [facility]/ '[]'$  Follow-up with PCP Additional Notes: Pt on period one week early, no missed BC pills. Questioning if she should continue with pills as usual or skip to non hormonal pills. Pt advised to continue. She states she has a heavy period and if she continues to be heavy next week she will call back due to being concerned of losing too much blood. Pt has appt already scheduled but it is for September.   Reason for Disposition  [1] Menstrual cycle < 21 days OR > 35 days AND [2] has occurred once this past year  Answer Assessment - Initial Assessment Questions 1. AMOUNT: "Describe the bleeding that you are having."    - SPOTTING: spotting, or pinkish / brownish mucous discharge; does not fill panty liner or pad    - MILD:  less than 1 pad / hour; less than patient's usual menstrual bleeding   - MODERATE: 1-2 pads / hour; 1 menstrual cup every 6 hours; small-medium blood clots (e.g., pea, grape, small coin)   - SEVERE: soaking 2 or more pads/hour for 2 or more hours; 1 menstrual cup every 2 hours; bleeding not contained by pads or continuous red blood from vagina; large blood clots (e.g., golf ball, large coin)      Not heavier than usual, just a week early 2. ONSET: "When did the bleeding begin?" "Is it continuing now?"     Started over the weekend  3. MENSTRUAL PERIOD: "When was the last normal menstrual period?" "How is this different than your period?"     Normal time when start taking "green bc pills" takes "blue" ones for 3 weeks then the green, she has not missed a pill but worried if have period twice 4. REGULARITY: "How regular are your periods?"     Very  regular 5. ABDOMEN PAIN: "Do you have any pain?" "How bad is the pain?"  (e.g., Scale 1-10; mild, moderate, or severe)   - MILD (1-3): doesn't interfere with normal activities, abdomen soft and not tender to touch    - MODERATE (4-7): interferes with normal activities or awakens from sleep, abdomen tender to touch    - SEVERE (8-10): excruciating pain, doubled over, unable to do any normal activities      na 6. PREGNANCY: "Is there any chance you are pregnant?" "When was your last menstrual period?"     na 7. BREASTFEEDING: "Are you breastfeeding?"     no 8. HORMONE MEDICINES: "Are you taking any hormone medicines, prescription or over-the-counter?" (e.g., birth control pills, estrogen)     Bc pills 9  Protocols used: Vaginal Bleeding - Abnormal-A-AH

## 2022-05-22 NOTE — Telephone Encounter (Signed)
Agree with disposition. 

## 2022-05-28 ENCOUNTER — Other Ambulatory Visit (HOSPITAL_COMMUNITY): Payer: Self-pay

## 2022-06-17 ENCOUNTER — Encounter (HOSPITAL_BASED_OUTPATIENT_CLINIC_OR_DEPARTMENT_OTHER): Payer: Self-pay | Admitting: Cardiology

## 2022-06-20 ENCOUNTER — Other Ambulatory Visit (HOSPITAL_COMMUNITY): Payer: Self-pay

## 2022-06-20 ENCOUNTER — Other Ambulatory Visit: Payer: Self-pay | Admitting: Family Medicine

## 2022-06-20 DIAGNOSIS — Z3041 Encounter for surveillance of contraceptive pills: Secondary | ICD-10-CM

## 2022-06-20 MED ORDER — NORGESTIMATE-ETH ESTRADIOL 0.25-35 MG-MCG PO TABS
1.0000 | ORAL_TABLET | Freq: Every day | ORAL | 3 refills | Status: DC
  Filled 2022-06-20: qty 28, 28d supply, fill #0
  Filled 2022-07-16: qty 28, 28d supply, fill #1
  Filled 2022-08-22: qty 28, 28d supply, fill #2
  Filled 2022-09-14: qty 28, 28d supply, fill #3

## 2022-06-20 NOTE — Telephone Encounter (Signed)
Requested Prescriptions  Pending Prescriptions Disp Refills  . norgestimate-ethinyl estradiol (VYLIBRA) 0.25-35 MG-MCG tablet 28 tablet 6    Sig: Take 1 tablet by mouth daily.     OB/GYN:  Contraceptives Passed - 06/20/2022  8:17 AM      Passed - Last BP in normal range    BP Readings from Last 1 Encounters:  05/09/22 100/70         Passed - Valid encounter within last 12 months    Recent Outpatient Visits          1 month ago Muscle spasm   [facility], [place], [place]   7 months ago Left upper quadrant abdominal pain   [place], [place], MD   11 months ago Flank pain   [place], [place], [place]   1 year ago Flank pain   [facility] Charlott Rakes, MD   1 year ago Screening for diabetes mellitus   [place], MD      Future Appointments            In 4 weeks Charlott Rakes, MD [place] - Patient is not a smoker

## 2022-07-16 ENCOUNTER — Other Ambulatory Visit (HOSPITAL_COMMUNITY): Payer: Self-pay

## 2022-07-18 ENCOUNTER — Other Ambulatory Visit: Payer: Self-pay | Admitting: Family Medicine

## 2022-07-18 ENCOUNTER — Ambulatory Visit: Payer: 59 | Attending: Family Medicine | Admitting: Family Medicine

## 2022-07-18 ENCOUNTER — Encounter: Payer: Self-pay | Admitting: Family Medicine

## 2022-07-18 ENCOUNTER — Other Ambulatory Visit (HOSPITAL_COMMUNITY): Payer: Self-pay

## 2022-07-18 ENCOUNTER — Other Ambulatory Visit (HOSPITAL_COMMUNITY)
Admission: RE | Admit: 2022-07-18 | Discharge: 2022-07-18 | Disposition: A | Discharge status: Home / Self Care | Payer: 59 | Source: Ambulatory Visit | Attending: Family Medicine | Admitting: Family Medicine

## 2022-07-18 VITALS — BP 130/82 | HR 72 | Temp 98.1°F | Ht 64.0 in | Wt 187.8 lb

## 2022-07-18 DIAGNOSIS — N898 Other specified noninflammatory disorders of vagina: Secondary | ICD-10-CM | POA: Insufficient documentation

## 2022-07-18 DIAGNOSIS — J302 Other seasonal allergic rhinitis: Secondary | ICD-10-CM | POA: Diagnosis not present

## 2022-07-18 DIAGNOSIS — Z01419 Encounter for gynecological examination (general) (routine) without abnormal findings: Secondary | ICD-10-CM | POA: Insufficient documentation

## 2022-07-18 DIAGNOSIS — I10 Essential (primary) hypertension: Secondary | ICD-10-CM | POA: Diagnosis not present

## 2022-07-18 MED ORDER — AMLODIPINE BESYLATE 2.5 MG PO TABS
2.5000 mg | ORAL_TABLET | Freq: Every day | ORAL | 1 refills | Status: DC
  Filled 2022-07-18 – 2022-07-19 (×2): qty 9, 9d supply, fill #0

## 2022-07-18 MED ORDER — FLUTICASONE PROPIONATE HFA 110 MCG/ACT IN AERO
1.0000 | INHALATION_SPRAY | Freq: Two times a day (BID) | RESPIRATORY_TRACT | 2 refills | Status: AC
  Filled 2022-07-18: qty 12, 60d supply, fill #0
  Filled 2023-01-11 – 2023-01-15 (×2): qty 12, 30d supply, fill #0

## 2022-07-18 MED ORDER — ALBUTEROL SULFATE HFA 108 (90 BASE) MCG/ACT IN AERS
1.0000 | INHALATION_SPRAY | Freq: Four times a day (QID) | RESPIRATORY_TRACT | 1 refills | Status: DC | PRN
  Filled 2022-07-18: qty 6.7, 30d supply, fill #0
  Filled 2023-01-11: qty 6.7, 30d supply, fill #1

## 2022-07-18 NOTE — Progress Notes (Signed)
States that BP drops at night May have tampon stuck in vagina.

## 2022-07-18 NOTE — Progress Notes (Signed)
Subjective:  Patient ID: Barbara Dunn, female    DOB: Jun 10, 1971  Age: 51 y.o. MRN: [id number]  CC: Hypertension   HPI Barbara Dunn is a 51 y.o. year old female with a history of hypertension, migraine, here for an office visit.  Interval History:  She thinks she might have a tampon in x2 weeks. Has a little vaginal discharge but no urinary symptoms, no abdominal pain or fever.  Complains of her BP dropping every now and then to 95/70s.  Currently on amlodipine and lisinopril for hypertension. She started a diet 3 months ago, does not eat after 7pm, cut out alcohol and sodas. She has lost 28 lbs in the last 1 year. She walks daily and goes to the [place].  Requests a refill on her inhalers which she needs for her allergies. Past Medical History:  Diagnosis Date   Anemia    Arthritis    Bronchitis    Hypertension    Migraines    Obesity     Past Surgical History:  Procedure Laterality Date   CARDIAC CATHETERIZATION N/A 08/02/2015   Procedure: Left Heart Cath and Coronary Angiography;  Surgeon: Troy Sine, MD;  Location: [facility];  Service: Cardiovascular;  Laterality: N/A;   CERVIX LESION DESTRUCTION     TUBAL LIGATION     TUBAL LIGATION      Family History  Problem Relation Age of Onset   Hypertension Mother    Schizophrenia Mother    Schizophrenia Sister    Schizophrenia Brother    Schizophrenia Brother     Social History   Socioeconomic History   Marital status: Single    Spouse name: Not on file   Number of children: Not on file   Years of education: Not on file   Highest education level: Not on file  Occupational History   Not on file  Tobacco Use   Smoking status: Former    Types: Cigarettes    Quit date: 12/15/2003    Years since quitting: 18.6   Smokeless tobacco: Never   Tobacco comments:    quit 10 years ago  Vaping Use   Vaping Use: Never used  Substance and Sexual Activity   Alcohol use: Yes    Alcohol/week: 2.0 standard drinks of  alcohol    Types: 1 Glasses of wine, 1 Shots of liquor per week    Comment: Once every 3 months    Drug use: No   Sexual activity: Yes    Birth control/protection: Other-see comments    Comment: tubal  Other Topics Concern   Not on file  Social History Narrative   Pt has GED. CNA as well.    Social Determinants of Health   Financial Resource Strain: Not on file  Food Insecurity: Not on file  Transportation Needs: Not on file  Physical Activity: Not on file  Stress: Not on file  Social Connections: Not on file    No Known Allergies  Outpatient Medications Prior to Visit  Medication Sig Dispense Refill   aspirin 81 MG tablet Take 81 mg by mouth daily.     benzonatate (TESSALON) 100 MG capsule TAKE 1 CAPSULE (100 MG TOTAL) BY MOUTH 3 (THREE) TIMES DAILY. 30 capsule 0   BIOTIN PO Take 2 tablets by mouth daily.      cetirizine (ZYRTEC) 10 MG tablet Take 10 mg by mouth daily.     chlorhexidine (PERIDEX) 0.12 % solution Use as directed 5 mLs in the mouth  or throat as needed.     chlorpheniramine-HYDROcodone (TUSSIONEX PENNKINETIC ER) 10-8 MG/5ML SUER Take 5 mLs by mouth every 12 (twelve) hours as needed for cough. 115 mL 0   docusate sodium (COLACE) 100 MG capsule Take 100 mg by mouth as needed.      fluticasone (FLONASE) 50 MCG/ACT nasal spray PLACE 2 SPRAYS INTO BOTH NOSTRILS DAILY. 16 g 2   HYDROcodone-homatropine (HYCODAN) 5-1.5 MG/5ML syrup Take 5 mLs by mouth every 6 (six) hours as needed for cough. 120 mL 0   lidocaine (LIDODERM) 5 % Place 1 patch onto the skin daily. Remove & Discard patch within 12 hours or as directed by MD 30 patch 0   lisinopril (ZESTRIL) 20 MG tablet Take 1 tablet (20 mg total) by mouth daily. 30 tablet 6   meclizine (ANTIVERT) 25 MG tablet Take 1 tablet (25 mg total) by mouth 3 (three) times daily as needed for dizziness. 60 tablet 1   methocarbamol (ROBAXIN) 500 MG tablet Take 2 tablets (1,000 mg total) by mouth every 8 (eight) hours as needed for muscle  spasms. 90 tablet 0   Multiple Vitamins-Minerals (MULTIVITAMIN ADULT PO) Take 1 tablet by mouth daily.      norgestimate-ethinyl estradiol (VYLIBRA) 0.25-35 MG-MCG tablet Take 1 tablet by mouth daily. 28 tablet 3   ondansetron (ZOFRAN) 4 MG tablet Take 1 tablet (4 mg total) by mouth every 6 (six) hours. 12 tablet 0   oxymetazoline (AFRIN NASAL SPRAY) 0.05 % nasal spray Place 1 spray into both nostrils 2 (two) times daily. 30 mL 0   SUMAtriptan (IMITREX) 50 MG tablet TAKE 2 TABLETS (100 MG TOTAL) BY MOUTH ONCE FOR 1 DOSE. MAY REPEAT IN 2 HOURS IF HEADACHE PERSISTS, MAX 4 TABLETS/DAY 10 tablet 6   tamsulosin (FLOMAX) 0.4 MG CAPS capsule Take 1 capsule (0.4 mg total) by mouth daily. 30 capsule 3   topiramate (TOPAMAX) 50 MG tablet Take 1 tablet (50 mg total) by mouth 2 (two) times daily. 60 tablet 6   topiramate (TOPAMAX) 50 MG tablet Take 1 tablet (50 mg total) by mouth 2 (two) times daily. 60 tablet 6   triamcinolone cream (KENALOG) 0.1 % APPLY 1 APPLICATION TOPICALLY 2 (TWO) TIMES DAILY. 30 g 2   amLODipine (NORVASC) 5 MG tablet Take 1 tablet (5 mg total) by mouth daily. 30 tablet 6   albuterol (VENTOLIN HFA) 108 (90 Base) MCG/ACT inhaler INHALE 1 PUFF INTO THE LUNGS EVERY 6 HOURS AS NEEDED FOR WHEEZING OR SHORTNESS OF BREATH. 18 g 1   fluticasone (FLOVENT HFA) 110 MCG/ACT inhaler INHALE 1 PUFF INTO THE LUNGS 2 (TWO) TIMES DAILY. 12 g 2   No facility-administered medications prior to visit.     ROS Review of Systems  Constitutional:  Negative for activity change and appetite change.  HENT:  Negative for sinus pressure and sore throat.   Respiratory:  Negative for chest tightness, shortness of breath and wheezing.   Cardiovascular:  Negative for chest pain and palpitations.  Gastrointestinal:  Negative for abdominal distention, abdominal pain and constipation.  Genitourinary: Negative.   Musculoskeletal: Negative.   Psychiatric/Behavioral:  Negative for behavioral problems and dysphoric  mood.     Objective:  BP 130/82   Pulse 72   Temp 98.1 F (36.7 C) (Oral)   Ht '5\' 4"'$  (1.626 m)   Wt 187 lb 12.8 oz (85.2 kg)   SpO2 100%   BMI 32.24 kg/m      07/18/2022    4:15 PM 05/09/2022  10:07 AM 05/03/2022    2:38 PM  BP/Weight  Systolic BP 841 660 630  Diastolic BP 82 70 80  Wt. (Lbs) 187.8 196 196.6  BMI 32.24 kg/m2 33.64 kg/m2 33.75 kg/m2      Physical Exam Constitutional:      Appearance: She is well-developed.  Cardiovascular:     Rate and Rhythm: Normal rate.     Heart sounds: Normal heart sounds. No murmur heard. Pulmonary:     Effort: Pulmonary effort is normal.     Breath sounds: Normal breath sounds. No wheezing or rales.  Chest:     Chest wall: No tenderness.  Abdominal:     General: Bowel sounds are normal. There is no distension.     Palpations: Abdomen is soft. There is no mass.     Tenderness: There is no abdominal tenderness.  Genitourinary:    Comments: Normal external genitalia, whitish discharge in vagina, no foreign object identified Normal bimanual pelvic exam. Musculoskeletal:        General: Normal range of motion.     Right lower leg: No edema.     Left lower leg: No edema.  Neurological:     Mental Status: She is alert and oriented to person, place, and time.  Psychiatric:        Mood and Affect: Mood normal.        Latest Ref Rng & Units 12/26/2021    8:35 AM 07/19/2021    9:14 AM 11/08/2020    2:43 PM  CMP  Glucose 70 - 99 mg/dL 88  85  74   BUN 6 - 24 mg/dL '9  9  9   '$ Creatinine 0.57 - 1.00 mg/dL 0.97  0.98  0.75   Sodium 134 - 144 mmol/L 136  140  138   Potassium 3.5 - 5.2 mmol/L 4.2  4.0  3.7   Chloride 96 - 106 mmol/L 103  106  105   CO2 20 - 29 mmol/L '21  21  23   '$ Calcium 8.7 - 10.2 mg/dL 8.8  8.6  9.1   Total Protein 6.0 - 8.5 g/dL 6.8  7.0  6.6   Total Bilirubin 0.0 - 1.2 mg/dL 0.3  0.3  0.4   Alkaline Phos 44 - 121 IU/L 58  51  44   AST 0 - 40 IU/L '19  16  13   '$ ALT 0 - 32 IU/L '15  9  16     '$ Lipid Panel      Component Value Date/Time   CHOL 193 12/26/2021 0835   TRIG 66 12/26/2021 0835   HDL 97 12/26/2021 0835   CHOLHDL 2.1 11/02/2019 1009   CHOLHDL 1.8 07/27/2016 0917   VLDL 14 07/27/2016 0917   LDLCALC 84 12/26/2021 0835    CBC    Component Value Date/Time   WBC 6.5 12/26/2021 0835   WBC 5.7 01/10/2016 0833   RBC 4.27 12/26/2021 0835   RBC 4.15 01/10/2016 0833   HGB 12.7 12/26/2021 0835   HCT 37.8 12/26/2021 0835   PLT 247 12/26/2021 0835   MCV 89 12/26/2021 0835   MCH 29.7 12/26/2021 0835   MCH 28.9 01/10/2016 0833   MCHC 33.6 12/26/2021 0835   MCHC 32.0 01/10/2016 0833   RDW 11.4 (L) 12/26/2021 0835   LYMPHSABS 2.3 12/26/2021 0835   MONOABS 0.7 10/19/2015 1008   EOSABS 0.1 12/26/2021 0835   BASOSABS 0.1 12/26/2021 0835    Lab Results  Component Value Date  HGBA1C 5.4 12/26/2021    Assessment & Plan:  1. Essential hypertension Blood pressure in the clinic is normal Due to complaints about hypotension I have reduced her dose of amlodipine as better blood pressure control is expected given her weight loss Continue lisinopril and advised if she continues to be hypertensive to discontinue amlodipine Counseled on blood pressure goal of less than 130/80, low-sodium, DASH diet, medication compliance, 150 minutes of moderate intensity exercise per week. Discussed medication compliance, adverse effects. - amLODipine (NORVASC) 2.5 MG tablet; Take 1 tablet (2.5 mg total) by mouth daily.  Dispense: 9 tablet; Refill: 1  2. Seasonal allergies - albuterol (VENTOLIN HFA) 108 (90 Base) MCG/ACT inhaler; Inhale 1 puff into the lungs every 6 (six) hours as needed for wheezing and shortness of breath  Dispense: 6.7 g; Refill: 1 - fluticasone (FLOVENT HFA) 110 MCG/ACT inhaler; INHALE 1 PUFF INTO THE LUNGS 2 (TWO) TIMES DAILY.  Dispense: 12 g; Refill: 2  3. Vaginal discharge No finding of foreign object in pelvis - Cervicovaginal ancillary only    Meds ordered this encounter   Medications   amLODipine (NORVASC) 2.5 MG tablet    Sig: Take 1 tablet (2.5 mg total) by mouth daily.    Dispense:  9 tablet    Refill:  1    Dose decrease'   albuterol (VENTOLIN HFA) 108 (90 Base) MCG/ACT inhaler    Sig: Inhale 1 puff into the lungs every 6 (six) hours as needed for wheezing and shortness of breath    Dispense:  6.7 g    Refill:  1   fluticasone (FLOVENT HFA) 110 MCG/ACT inhaler    Sig: INHALE 1 PUFF INTO THE LUNGS 2 (TWO) TIMES DAILY.    Dispense:  12 g    Refill:  2    Follow-up: Return in about 4 months (around 11/17/2022) for Pap smear, CPE/ Preventive Health Exam.       Charlott Rakes, MD, FAAFP. [facility] [place], [place]   07/18/2022, 5:15 PM

## 2022-07-18 NOTE — Patient Instructions (Signed)
Exercising to Stay Healthy To become healthy and stay healthy, it is recommended that you do moderate-intensity and vigorous-intensity exercise. You can tell that you are exercising at a moderate intensity if your heart starts beating faster and you start breathing faster but can still hold a conversation. You can tell that you are exercising at a vigorous intensity if you are breathing much harder and faster and cannot hold a conversation while exercising. How can exercise benefit me? Exercising regularly is important. It has many health benefits, such as: Improving overall fitness, flexibility, and endurance. Increasing bone density. Helping with weight control. Decreasing body fat. Increasing muscle strength and endurance. Reducing stress and tension, anxiety, depression, or anger. Improving overall health. What guidelines should I follow while exercising? Before you start a new exercise program, talk with your health care provider. Do not exercise so much that you hurt yourself, feel dizzy, or get very short of breath. Wear comfortable clothes and wear shoes with good support. Drink plenty of water while you exercise to prevent dehydration or heat stroke. Work out until your breathing and your heartbeat get faster (moderate intensity). How often should I exercise? Choose an activity that you enjoy, and set realistic goals. Your health care provider can help you make an activity plan that is individually designed and works best for you. Exercise regularly as told by your health care provider. This may include: Doing strength training two times a week, such as: Lifting weights. Using resistance bands. Push-ups. Sit-ups. Yoga. Doing a certain intensity of exercise for a given amount of time. Choose from these options: A total of 150 minutes of moderate-intensity exercise every week. A total of 75 minutes of vigorous-intensity exercise every week. A mix of moderate-intensity and  vigorous-intensity exercise every week. Children, pregnant women, people who have not exercised regularly, people who are overweight, and older adults may need to talk with a health care provider about what activities are safe to perform. If you have a medical condition, be sure to talk with your health care provider before you start a new exercise program. What are some exercise ideas? Moderate-intensity exercise ideas include: Walking 1 mile (1.6 km) in about 15 minutes. Biking. Hiking. Golfing. Dancing. Water aerobics. Vigorous-intensity exercise ideas include: Walking 4.5 miles (7.2 km) or more in about 1 hour. Jogging or running 5 miles (8 km) in about 1 hour. Biking 10 miles (16.1 km) or more in about 1 hour. Lap swimming. Roller-skating or in-line skating. Cross-country skiing. Vigorous competitive sports, such as football, basketball, and soccer. Jumping rope. Aerobic dancing. What are some everyday activities that can help me get exercise? Yard work, such as: Pushing a lawn mower. Raking and bagging leaves. Washing your car. Pushing a stroller. Shoveling snow. Gardening. Washing windows or floors. How can I be more active in my day-to-day activities? Use stairs instead of an elevator. Take a walk during your lunch break. If you drive, park your car farther away from your work or school. If you take public transportation, get off one stop early and walk the rest of the way. Stand up or walk around during all of your indoor phone calls. Get up, stretch, and walk around every 30 minutes throughout the day. Enjoy exercise with a friend. Support to continue exercising will help you keep a regular routine of activity. Where to find more information You can find more information about exercising to stay healthy from: U.S. Department of Health and Human Services: www.hhs.gov Centers for Disease Control and Prevention (  CDC): www.cdc.gov Summary Exercising regularly is  important. It will improve your overall fitness, flexibility, and endurance. Regular exercise will also improve your overall health. It can help you control your weight, reduce stress, and improve your bone density. Do not exercise so much that you hurt yourself, feel dizzy, or get very short of breath. Before you start a new exercise program, talk with your health care provider. This information is not intended to replace advice given to you by your health care provider. Make sure you discuss any questions you have with your health care provider. Document Revised: 02/03/2021 Document Reviewed: 02/03/2021 Elsevier Patient Education  2023 [employer]vier Inc.  

## 2022-07-19 ENCOUNTER — Other Ambulatory Visit: Payer: Self-pay | Admitting: Family Medicine

## 2022-07-19 ENCOUNTER — Other Ambulatory Visit (HOSPITAL_COMMUNITY): Payer: Self-pay

## 2022-07-19 DIAGNOSIS — I10 Essential (primary) hypertension: Secondary | ICD-10-CM

## 2022-07-19 MED ORDER — AMLODIPINE BESYLATE 2.5 MG PO TABS
2.5000 mg | ORAL_TABLET | Freq: Every day | ORAL | 1 refills | Status: DC
  Filled 2022-07-19: qty 30, 30d supply, fill #0
  Filled 2022-08-13: qty 30, 30d supply, fill #1
  Filled 2022-09-14: qty 30, 30d supply, fill #2
  Filled 2022-10-12: qty 30, 30d supply, fill #3
  Filled 2022-11-09: qty 30, 30d supply, fill #4

## 2022-07-20 ENCOUNTER — Other Ambulatory Visit (HOSPITAL_COMMUNITY): Payer: Self-pay

## 2022-07-20 LAB — CERVICOVAGINAL ANCILLARY ONLY
Bacterial Vaginitis (gardnerella): NEGATIVE
Candida Glabrata: NEGATIVE
Candida Vaginitis: NEGATIVE
Chlamydia: NEGATIVE
Comment: NEGATIVE
Comment: NEGATIVE
Comment: NEGATIVE
Comment: NEGATIVE
Comment: NEGATIVE
Comment: NORMAL
Neisseria Gonorrhea: NEGATIVE
Trichomonas: NEGATIVE

## 2022-08-13 ENCOUNTER — Other Ambulatory Visit (HOSPITAL_COMMUNITY): Payer: Self-pay

## 2022-08-17 ENCOUNTER — Other Ambulatory Visit (HOSPITAL_COMMUNITY): Payer: Self-pay

## 2022-08-22 ENCOUNTER — Other Ambulatory Visit (HOSPITAL_COMMUNITY): Payer: Self-pay

## 2022-09-14 ENCOUNTER — Other Ambulatory Visit (HOSPITAL_COMMUNITY): Payer: Self-pay

## 2022-09-26 ENCOUNTER — Other Ambulatory Visit (HOSPITAL_COMMUNITY): Payer: Self-pay

## 2022-10-12 ENCOUNTER — Other Ambulatory Visit: Payer: Self-pay | Admitting: Family Medicine

## 2022-10-12 ENCOUNTER — Other Ambulatory Visit (HOSPITAL_COMMUNITY): Payer: Self-pay

## 2022-10-12 DIAGNOSIS — Z3041 Encounter for surveillance of contraceptive pills: Secondary | ICD-10-CM

## 2022-10-12 MED ORDER — NORGESTIMATE-ETH ESTRADIOL 0.25-35 MG-MCG PO TABS
1.0000 | ORAL_TABLET | Freq: Every day | ORAL | 3 refills | Status: DC
  Filled 2022-10-12: qty 28, 28d supply, fill #0
  Filled 2022-11-09: qty 28, 28d supply, fill #1
  Filled 2022-12-07: qty 28, 28d supply, fill #2
  Filled 2023-01-07: qty 28, 28d supply, fill #3

## 2022-10-12 NOTE — Telephone Encounter (Signed)
Requested Prescriptions  Pending Prescriptions Disp Refills   norgestimate-ethinyl estradiol (VYLIBRA) 0.25-35 MG-MCG tablet 28 tablet 3    Sig: Take 1 tablet by mouth daily.     OB/GYN:  Contraceptives Passed - 10/12/2022 12:51 PM      Passed - Last BP in normal range    BP Readings from Last 1 Encounters:  07/18/22 130/82         Passed - Valid encounter within last 12 months    Recent Outpatient Visits           2 months ago Vaginal discharge   [place], [place]ong, MD   5 months ago Muscle spasm   [place], Levada Dy M, [place]   11 months ago Left upper quadrant abdominal pain   [place], Enobong, MD   1 year ago Flank pain   [place], [place], [place]   1 year ago Flank pain   [place], Enobong, MD       Future Appointments             In 1 month Charlott Rakes, MD [place] - Patient is not a smoker

## 2022-11-09 ENCOUNTER — Other Ambulatory Visit: Payer: Self-pay | Admitting: Family Medicine

## 2022-11-09 ENCOUNTER — Other Ambulatory Visit: Payer: Self-pay

## 2022-11-09 ENCOUNTER — Other Ambulatory Visit (HOSPITAL_COMMUNITY): Payer: Self-pay

## 2022-11-09 DIAGNOSIS — G43009 Migraine without aura, not intractable, without status migrainosus: Secondary | ICD-10-CM

## 2022-11-12 ENCOUNTER — Other Ambulatory Visit (HOSPITAL_COMMUNITY): Payer: Self-pay

## 2022-11-12 MED ORDER — TOPIRAMATE 50 MG PO TABS
50.0000 mg | ORAL_TABLET | Freq: Two times a day (BID) | ORAL | 6 refills | Status: DC
  Filled 2022-11-12: qty 60, 30d supply, fill #0
  Filled 2023-01-18: qty 60, 30d supply, fill #1
  Filled 2023-03-19: qty 60, 30d supply, fill #2
  Filled 2023-05-17: qty 60, 30d supply, fill #3
  Filled 2023-07-23: qty 60, 30d supply, fill #4
  Filled 2023-08-19: qty 60, 30d supply, fill #5
  Filled 2023-10-15: qty 60, 30d supply, fill #6

## 2022-11-12 MED ORDER — SUMATRIPTAN SUCCINATE 50 MG PO TABS
ORAL_TABLET | ORAL | 6 refills | Status: DC
  Filled 2022-11-12: qty 10, 30d supply, fill #0
  Filled 2023-01-11: qty 3, 3d supply, fill #1
  Filled 2023-01-11: qty 10, 30d supply, fill #1
  Filled 2023-01-11: qty 7, 27d supply, fill #1
  Filled 2023-05-17: qty 10, 30d supply, fill #2
  Filled 2023-11-07: qty 10, 30d supply, fill #3

## 2022-11-20 ENCOUNTER — Other Ambulatory Visit (HOSPITAL_COMMUNITY)
Admission: RE | Admit: 2022-11-20 | Discharge: 2022-11-20 | Disposition: A | Discharge status: Home / Self Care | Payer: 59 | Source: Ambulatory Visit | Attending: Family Medicine | Admitting: Family Medicine

## 2022-11-20 ENCOUNTER — Encounter: Payer: Self-pay | Admitting: Family Medicine

## 2022-11-20 ENCOUNTER — Ambulatory Visit: Payer: 59 | Attending: Family Medicine | Admitting: Family Medicine

## 2022-11-20 ENCOUNTER — Other Ambulatory Visit (HOSPITAL_COMMUNITY): Payer: Self-pay

## 2022-11-20 VITALS — BP 138/84 | HR 71 | Temp 98.3°F | Ht 64.0 in | Wt 188.6 lb

## 2022-11-20 DIAGNOSIS — Z1231 Encounter for screening mammogram for malignant neoplasm of breast: Secondary | ICD-10-CM

## 2022-11-20 DIAGNOSIS — Z124 Encounter for screening for malignant neoplasm of cervix: Secondary | ICD-10-CM | POA: Insufficient documentation

## 2022-11-20 DIAGNOSIS — Z0001 Encounter for general adult medical examination with abnormal findings: Secondary | ICD-10-CM

## 2022-11-20 DIAGNOSIS — Z131 Encounter for screening for diabetes mellitus: Secondary | ICD-10-CM | POA: Diagnosis not present

## 2022-11-20 DIAGNOSIS — I10 Essential (primary) hypertension: Secondary | ICD-10-CM

## 2022-11-20 DIAGNOSIS — Z1211 Encounter for screening for malignant neoplasm of colon: Secondary | ICD-10-CM

## 2022-11-20 DIAGNOSIS — R69 Illness, unspecified: Secondary | ICD-10-CM | POA: Diagnosis not present

## 2022-11-20 DIAGNOSIS — Z113 Encounter for screening for infections with a predominantly sexual mode of transmission: Secondary | ICD-10-CM | POA: Insufficient documentation

## 2022-11-20 MED ORDER — TRIAMCINOLONE ACETONIDE 0.1 % EX CREA
TOPICAL_CREAM | Freq: Two times a day (BID) | CUTANEOUS | 2 refills | Status: DC
  Filled 2022-11-20: qty 30, 15d supply, fill #0
  Filled 2023-06-17: qty 30, 15d supply, fill #1
  Filled 2023-11-07: qty 30, 15d supply, fill #2

## 2022-11-20 MED ORDER — LISINOPRIL 20 MG PO TABS
20.0000 mg | ORAL_TABLET | Freq: Every day | ORAL | 1 refills | Status: DC
  Filled 2022-11-20: qty 90, 90d supply, fill #0
  Filled 2022-12-14: qty 30, 30d supply, fill #0
  Filled 2023-01-11: qty 30, 30d supply, fill #1
  Filled 2023-02-11: qty 30, 30d supply, fill #2
  Filled 2023-03-19: qty 30, 30d supply, fill #3
  Filled 2023-04-23 (×2): qty 30, 30d supply, fill #4

## 2022-11-20 MED ORDER — AMLODIPINE BESYLATE 2.5 MG PO TABS
2.5000 mg | ORAL_TABLET | Freq: Every day | ORAL | 1 refills | Status: DC
  Filled 2022-11-20: qty 90, 90d supply, fill #0
  Filled 2022-12-14: qty 30, 30d supply, fill #0
  Filled 2023-01-11: qty 30, 30d supply, fill #1
  Filled 2023-02-11: qty 30, 30d supply, fill #2
  Filled 2023-03-19: qty 30, 30d supply, fill #3
  Filled 2023-04-23 (×2): qty 30, 30d supply, fill #4

## 2022-11-20 NOTE — Patient Instructions (Signed)

## 2022-11-20 NOTE — Progress Notes (Signed)
Subjective:  Patient ID: Barbara Dunn, female    DOB: [birth year]  Age: 52 y.o. MRN: [id number]  CC: Annual Exam and Gynecologic Exam   HPI Barbara Dunn is a 52 y.o. year old female with a history of hypertension, migraine. She presents today for complete physical exam and a gynecological exam.  Interval History:  She is due for breast cancer, colorectal cancer, cervical cancer screening and would also like an STD test. She exercises regularly by going to the gym and has maintained her weight loss.  She plans to lose additional 10 to 15 pounds.  She sees a Pharmacist, community and ophthalmologist regularly.  Endorses adherence with her antihypertensive and her migraine is controlled on her current regimen. She does not have additional concerns today.  Past Medical History:  Diagnosis Date   Anemia    Arthritis    Bronchitis    Hypertension    Migraines    Obesity     Past Surgical History:  Procedure Laterality Date   CARDIAC CATHETERIZATION N/A 08/02/2015   Procedure: Left Heart Cath and Coronary Angiography;  Surgeon: Troy Sine, MD;  Location: [facility];  Service: Cardiovascular;  Laterality: N/A;   CERVIX LESION DESTRUCTION     TUBAL LIGATION     TUBAL LIGATION      Family History  Problem Relation Age of Onset   Hypertension Mother    Schizophrenia Mother    Schizophrenia Sister    Schizophrenia Brother    Schizophrenia Brother     Social History   Socioeconomic History   Marital status: Single    Spouse name: Not on file   Number of children: Not on file   Years of education: Not on file   Highest education level: Not on file  Occupational History   Not on file  Tobacco Use   Smoking status: Former    Types: Cigarettes    Quit date: 12/15/2003    Years since quitting: 18.9   Smokeless tobacco: Never   Tobacco comments:    quit 10 years ago  Vaping Use   Vaping Use: Never used  Substance and Sexual Activity   Alcohol use: Yes    Alcohol/week: 2.0  standard drinks of alcohol    Types: 1 Glasses of wine, 1 Shots of liquor per week    Comment: Once every 3 months    Drug use: No   Sexual activity: Yes    Birth control/protection: Other-see comments    Comment: tubal  Other Topics Concern   Not on file  Social History Narrative   Pt has GED. CNA as well.    Social Determinants of Health   Financial Resource Strain: Not on file  Food Insecurity: Not on file  Transportation Needs: Not on file  Physical Activity: Not on file  Stress: Not on file  Social Connections: Not on file    No Known Allergies  Outpatient Medications Prior to Visit  Medication Sig Dispense Refill   albuterol (VENTOLIN HFA) 108 (90 Base) MCG/ACT inhaler Inhale 1 puff into the lungs every 6 (six) hours as needed for wheezing and shortness of breath 6.7 g 1   aspirin 81 MG tablet Take 81 mg by mouth daily.     benzonatate (TESSALON) 100 MG capsule TAKE 1 CAPSULE (100 MG TOTAL) BY MOUTH 3 (THREE) TIMES DAILY. 30 capsule 0   BIOTIN PO Take 2 tablets by mouth daily.      cetirizine (ZYRTEC) 10 MG tablet Take  10 mg by mouth daily.     chlorhexidine (PERIDEX) 0.12 % solution Use as directed 5 mLs in the mouth or throat as needed.     chlorpheniramine-HYDROcodone (TUSSIONEX PENNKINETIC ER) 10-8 MG/5ML SUER Take 5 mLs by mouth every 12 (twelve) hours as needed for cough. 115 mL 0   docusate sodium (COLACE) 100 MG capsule Take 100 mg by mouth as needed.      fluticasone (FLONASE) 50 MCG/ACT nasal spray PLACE 2 SPRAYS INTO BOTH NOSTRILS DAILY. 16 g 2   fluticasone (FLOVENT HFA) 110 MCG/ACT inhaler INHALE 1 PUFF INTO THE LUNGS 2 (TWO) TIMES DAILY. 12 g 2   HYDROcodone-homatropine (HYCODAN) 5-1.5 MG/5ML syrup Take 5 mLs by mouth every 6 (six) hours as needed for cough. 120 mL 0   lidocaine (LIDODERM) 5 % Place 1 patch onto the skin daily. Remove & Discard patch within 12 hours or as directed by MD 30 patch 0   meclizine (ANTIVERT) 25 MG tablet Take 1 tablet (25 mg  total) by mouth 3 (three) times daily as needed for dizziness. 60 tablet 1   methocarbamol (ROBAXIN) 500 MG tablet Take 2 tablets (1,000 mg total) by mouth every 8 (eight) hours as needed for muscle spasms. 90 tablet 0   Multiple Vitamins-Minerals (MULTIVITAMIN ADULT PO) Take 1 tablet by mouth daily.      norgestimate-ethinyl estradiol (VYLIBRA) 0.25-35 MG-MCG tablet Take 1 tablet by mouth daily. 28 tablet 3   ondansetron (ZOFRAN) 4 MG tablet Take 1 tablet (4 mg total) by mouth every 6 (six) hours. 12 tablet 0   oxymetazoline (AFRIN NASAL SPRAY) 0.05 % nasal spray Place 1 spray into both nostrils 2 (two) times daily. 30 mL 0   SUMAtriptan (IMITREX) 50 MG tablet TAKE 2 TABLETS (100 MG TOTAL) BY MOUTH ONCE FOR 1 DOSE. MAY REPEAT IN 2 HOURS IF HEADACHE PERSISTS, MAX 4 TABLETS/DAY 10 tablet 6   tamsulosin (FLOMAX) 0.4 MG CAPS capsule Take 1 capsule (0.4 mg total) by mouth daily. 30 capsule 3   topiramate (TOPAMAX) 50 MG tablet Take 1 tablet (50 mg total) by mouth 2 (two) times daily. 60 tablet 6   topiramate (TOPAMAX) 50 MG tablet Take 1 tablet (50 mg total) by mouth 2 (two) times daily. 60 tablet 6   amLODipine (NORVASC) 2.5 MG tablet Take 1 tablet (2.5 mg total) by mouth daily. 90 tablet 1   lisinopril (ZESTRIL) 20 MG tablet Take 1 tablet (20 mg total) by mouth daily. 30 tablet 6   triamcinolone cream (KENALOG) 0.1 % APPLY 1 APPLICATION TOPICALLY 2 (TWO) TIMES DAILY. 30 g 2   No facility-administered medications prior to visit.     ROS Review of Systems  Constitutional:  Negative for activity change, appetite change and fatigue.  HENT:  Negative for congestion, sinus pressure and sore throat.   Eyes:  Negative for visual disturbance.  Respiratory:  Negative for cough, chest tightness, shortness of breath and wheezing.   Cardiovascular:  Negative for chest pain and palpitations.  Gastrointestinal:  Negative for abdominal distention, abdominal pain and constipation.  Endocrine: Negative for  polydipsia.  Genitourinary:  Negative for dysuria and frequency.  Musculoskeletal:  Negative for arthralgias and back pain.  Skin:  Negative for rash.  Neurological:  Negative for tremors, light-headedness and numbness.  Hematological:  Does not bruise/bleed easily.  Psychiatric/Behavioral:  Negative for agitation and behavioral problems.     Objective:  BP 138/84   Pulse 71   Temp 98.3 F (36.8 C) (Oral)  Ht '5\' 4"'$  (1.626 m)   Wt 188 lb 9.6 oz (85.5 kg)   SpO2 100%   BMI 32.37 kg/m      11/20/2022    9:12 AM 11/20/2022    8:44 AM 07/18/2022    4:15 PM  BP/Weight  Systolic BP 568 127 517  Diastolic BP 84 86 82  Wt. (Lbs)  188.6 187.8  BMI  32.37 kg/m2 32.24 kg/m2      Physical Exam Exam conducted with a chaperone present.  Constitutional:      General: She is not in acute distress.    Appearance: She is well-developed. She is not diaphoretic.  HENT:     Head: Normocephalic.     Right Ear: External ear normal.     Left Ear: External ear normal.     Nose: Nose normal.  Eyes:     Conjunctiva/sclera: Conjunctivae normal.     Pupils: Pupils are equal, round, and reactive to light.  Neck:     Vascular: No JVD.  Cardiovascular:     Rate and Rhythm: Normal rate and regular rhythm.     Heart sounds: Normal heart sounds. No murmur heard.    No gallop.  Pulmonary:     Effort: Pulmonary effort is normal. No respiratory distress.     Breath sounds: Normal breath sounds. No wheezing or rales.  Chest:     Chest wall: No tenderness.  Breasts:    Right: Normal. No mass, nipple discharge or tenderness.     Left: Normal. No mass, nipple discharge or tenderness.  Abdominal:     General: Bowel sounds are normal. There is no distension.     Palpations: Abdomen is soft. There is no mass.     Tenderness: There is no abdominal tenderness.     Hernia: There is no hernia in the left inguinal area or right inguinal area.  Genitourinary:    General: Normal vulva.     Pubic Area:  No rash.      Labia:        Right: No rash.        Left: No rash.      Vagina: Normal.     Cervix: Normal.     Uterus: Normal.      Adnexa: Right adnexa normal and left adnexa normal.       Right: No tenderness.         Left: No tenderness.    Musculoskeletal:        General: No tenderness. Normal range of motion.     Cervical back: Normal range of motion. No tenderness.  Lymphadenopathy:     Upper Body:     Right upper body: No supraclavicular or axillary adenopathy.     Left upper body: No supraclavicular or axillary adenopathy.  Skin:    General: Skin is warm and dry.  Neurological:     Mental Status: She is alert and oriented to person, place, and time.     Deep Tendon Reflexes: Reflexes are normal and symmetric.        Latest Ref Rng & Units 12/26/2021    8:35 AM 07/19/2021    9:14 AM 11/08/2020    2:43 PM  CMP  Glucose 70 - 99 mg/dL 88  85  74   BUN 6 - 24 mg/dL '9  9  9   '$ Creatinine 0.57 - 1.00 mg/dL 0.97  0.98  0.75   Sodium 134 - 144 mmol/L 136  140  138  Potassium 3.5 - 5.2 mmol/L 4.2  4.0  3.7   Chloride 96 - 106 mmol/L 103  106  105   CO2 20 - 29 mmol/L '21  21  23   '$ Calcium 8.7 - 10.2 mg/dL 8.8  8.6  9.1   Total Protein 6.0 - 8.5 g/dL 6.8  7.0  6.6   Total Bilirubin 0.0 - 1.2 mg/dL 0.3  0.3  0.4   Alkaline Phos 44 - 121 IU/L 58  51  44   AST 0 - 40 IU/L '19  16  13   '$ ALT 0 - 32 IU/L '15  9  16     '$ Lipid Panel     Component Value Date/Time   CHOL 193 12/26/2021 0835   TRIG 66 12/26/2021 0835   HDL 97 12/26/2021 0835   CHOLHDL 2.1 11/02/2019 1009   CHOLHDL 1.8 07/27/2016 0917   VLDL 14 07/27/2016 0917   LDLCALC 84 12/26/2021 0835    CBC    Component Value Date/Time   WBC 6.5 12/26/2021 0835   WBC 5.7 01/10/2016 0833   RBC 4.27 12/26/2021 0835   RBC 4.15 01/10/2016 0833   HGB 12.7 12/26/2021 0835   HCT 37.8 12/26/2021 0835   PLT 247 12/26/2021 0835   MCV 89 12/26/2021 0835   MCH 29.7 12/26/2021 0835   MCH 28.9 01/10/2016 0833   MCHC 33.6  12/26/2021 0835   MCHC 32.0 01/10/2016 0833   RDW 11.4 (L) 12/26/2021 0835   LYMPHSABS 2.3 12/26/2021 0835   MONOABS 0.7 10/19/2015 1008   EOSABS 0.1 12/26/2021 0835   BASOSABS 0.1 12/26/2021 0835    Lab Results  Component Value Date   HGBA1C 5.4 12/26/2021     Assessment & Plan:  1. Annual visit for general adult medical examination with abnormal findings Counseled on 150 minutes of exercise per week, healthy eating (including decreased daily intake of saturated fats, cholesterol, added sugars, sodium), routine healthcare maintenance.  - LP+Non-HDL Cholesterol - CMP14+EGFR - CBC with Differential/Platelet  2. Screening for cervical cancer - Cytology - PAP  3. Encounter for screening mammogram for malignant neoplasm of breast - MM 3D SCREEN BREAST BILATERAL; Future  4. Screening for colon cancer - Ambulatory referral to Gastroenterology  5. Screening for diabetes mellitus - Hemoglobin A1c  6. Screening for STD (sexually transmitted disease) - Cervicovaginal ancillary only  7. Essential hypertension Controlled Continue current regimen Counseled on blood pressure goal of less than 130/80, low-sodium, DASH diet, medication compliance, 150 minutes of moderate intensity exercise per week. Discussed medication compliance, adverse effects. - amLODipine (NORVASC) 2.5 MG tablet; Take 1 tablet (2.5 mg total) by mouth daily.  Dispense: 90 tablet; Refill: 1 - lisinopril (ZESTRIL) 20 MG tablet; Take 1 tablet (20 mg total) by mouth daily.  Dispense: 90 tablet; Refill: 1   Meds ordered this encounter  Medications   amLODipine (NORVASC) 2.5 MG tablet    Sig: Take 1 tablet (2.5 mg total) by mouth daily.    Dispense:  90 tablet    Refill:  1    Dose decrease   lisinopril (ZESTRIL) 20 MG tablet    Sig: Take 1 tablet (20 mg total) by mouth daily.    Dispense:  90 tablet    Refill:  1   triamcinolone cream (KENALOG) 0.1 %    Sig: APPLY 1 APPLICATION TOPICALLY 2 (TWO) TIMES  DAILY.    Dispense:  30 g    Refill:  2    Follow-up: Return in  about 6 months (around 05/21/2023) for Chronic medical conditions.       Charlott Rakes, MD, FAAFP. [facility] and [place], [place]   11/20/2022, 9:19 AM

## 2022-11-21 LAB — CBC WITH DIFFERENTIAL/PLATELET
Basophils Absolute: 0.1 10*3/uL (ref 0.0–0.2)
Basos: 1 %
EOS (ABSOLUTE): 0.1 10*3/uL (ref 0.0–0.4)
Eos: 2 %
Hematocrit: 35.4 % (ref 34.0–46.6)
Hemoglobin: 12 g/dL (ref 11.1–15.9)
Immature Grans (Abs): 0 10*3/uL (ref 0.0–0.1)
Immature Granulocytes: 0 %
Lymphocytes Absolute: 2.1 10*3/uL (ref 0.7–3.1)
Lymphs: 36 %
MCH: 29.8 pg (ref 26.6–33.0)
MCHC: 33.9 g/dL (ref 31.5–35.7)
MCV: 88 fL (ref 79–97)
Monocytes Absolute: 0.6 10*3/uL (ref 0.1–0.9)
Monocytes: 11 %
Neutrophils Absolute: 2.9 10*3/uL (ref 1.4–7.0)
Neutrophils: 50 %
Platelets: 245 10*3/uL (ref 150–450)
RBC: 4.03 x10E6/uL (ref 3.77–5.28)
RDW: 11.5 % — ABNORMAL LOW (ref 11.7–15.4)
WBC: 5.8 10*3/uL (ref 3.4–10.8)

## 2022-11-21 LAB — CERVICOVAGINAL ANCILLARY ONLY
Bacterial Vaginitis (gardnerella): NEGATIVE
Candida Glabrata: NEGATIVE
Candida Vaginitis: NEGATIVE
Chlamydia: NEGATIVE
Comment: NEGATIVE
Comment: NEGATIVE
Comment: NEGATIVE
Comment: NEGATIVE
Comment: NEGATIVE
Comment: NORMAL
Neisseria Gonorrhea: NEGATIVE
Trichomonas: NEGATIVE

## 2022-11-21 LAB — LP+NON-HDL CHOLESTEROL
Cholesterol, Total: 222 mg/dL — ABNORMAL HIGH (ref 100–199)
HDL: 123 mg/dL (ref >39)
LDL Chol Calc (NIH): 88 mg/dL (ref 0–99)
Total Non-HDL-Chol (LDL+VLDL): 99 mg/dL (ref 0–129)
Triglycerides: 63 mg/dL (ref 0–149)
VLDL Cholesterol Cal: 11 mg/dL (ref 5–40)

## 2022-11-21 LAB — CMP14+EGFR
ALT: 15 IU/L (ref 0–32)
AST: 26 IU/L (ref 0–40)
Albumin/Globulin Ratio: 1.5 (ref 1.2–2.2)
Albumin: 4.3 g/dL (ref 3.9–4.9)
Alkaline Phosphatase: 71 IU/L (ref 44–121)
BUN/Creatinine Ratio: 11 (ref 9–23)
BUN: 10 mg/dL (ref 6–24)
Bilirubin Total: 0.4 mg/dL (ref 0.0–1.2)
CO2: 19 mmol/L — ABNORMAL LOW (ref 20–29)
Calcium: 9.1 mg/dL (ref 8.7–10.2)
Chloride: 105 mmol/L (ref 96–106)
Creatinine, Ser: 0.91 mg/dL (ref 0.57–1.00)
Globulin, Total: 2.8 g/dL (ref 1.5–4.5)
Glucose: 87 mg/dL (ref 70–99)
Potassium: 3.9 mmol/L (ref 3.5–5.2)
Sodium: 140 mmol/L (ref 134–144)
Total Protein: 7.1 g/dL (ref 6.0–8.5)
eGFR: 77 mL/min/{1.73_m2} (ref >59)

## 2022-11-21 LAB — HEMOGLOBIN A1C
Est. average glucose Bld gHb Est-mCnc: 105 mg/dL
Hgb A1c MFr Bld: 5.3 % (ref 4.8–5.6)

## 2022-11-21 LAB — HIV ANTIBODY (ROUTINE TESTING W REFLEX): HIV Screen 4th Generation wRfx: NONREACTIVE

## 2022-11-26 LAB — CYTOLOGY - PAP
Comment: NEGATIVE
Diagnosis: NEGATIVE
Diagnosis: REACTIVE
High risk HPV: NEGATIVE

## 2022-11-28 ENCOUNTER — Ambulatory Visit
Admission: RE | Admit: 2022-11-28 | Discharge: 2022-11-28 | Disposition: A | Discharge status: Home / Self Care | Payer: 59 | Source: Ambulatory Visit | Attending: Family Medicine | Admitting: Family Medicine

## 2022-11-28 DIAGNOSIS — Z1231 Encounter for screening mammogram for malignant neoplasm of breast: Secondary | ICD-10-CM

## 2022-12-07 ENCOUNTER — Other Ambulatory Visit (HOSPITAL_COMMUNITY): Payer: Self-pay

## 2022-12-13 ENCOUNTER — Other Ambulatory Visit (HOSPITAL_COMMUNITY): Payer: Self-pay

## 2022-12-13 ENCOUNTER — Telehealth: Payer: Self-pay | Admitting: Podiatry

## 2022-12-13 ENCOUNTER — Other Ambulatory Visit: Payer: Self-pay

## 2022-12-13 ENCOUNTER — Ambulatory Visit: Payer: 59 | Admitting: Podiatry

## 2022-12-13 DIAGNOSIS — L600 Ingrowing nail: Secondary | ICD-10-CM | POA: Diagnosis not present

## 2022-12-13 MED ORDER — NEOMYCIN-POLYMYXIN-HC 3.5-10000-1 OT SUSP
OTIC | 0 refills | Status: DC
  Filled 2022-12-13: qty 10, 10d supply, fill #0
  Filled 2022-12-13: qty 10, 20d supply, fill #0

## 2022-12-13 MED ORDER — NEOMYCIN-POLYMYXIN-HC 3.5-10000-1 OT SUSP
OTIC | 0 refills | Status: DC
  Filled 2022-12-13: qty 10, fill #0

## 2022-12-13 NOTE — Telephone Encounter (Signed)
Pt calling asking if she was suppos to get a Rx for soap as well?    Please advise

## 2022-12-13 NOTE — Patient Instructions (Signed)

## 2022-12-14 ENCOUNTER — Other Ambulatory Visit (HOSPITAL_COMMUNITY): Payer: Self-pay

## 2022-12-14 NOTE — Telephone Encounter (Signed)
Patient is aware a verbalized understanding of information given.

## 2022-12-16 NOTE — Progress Notes (Signed)
  Subjective:  Patient ID: Barbara Dunn, female    DOB: [birth year],  MRN: [id number]  Chief Complaint  Patient presents with   Nail Problem    bil ingrowns;great toenails    52 y.o. female presents with the above complaint. History confirmed with patient.  This is been going on for quite some time, pedicures do not alleviated a more  Objective:  Physical Exam: warm, good capillary refill, no trophic changes or ulcerative lesions, normal DP and PT pulses, normal sensory exam, and medial hallux nail border ingrown bilateral no infection  Assessment:   1. Ingrowing left great toenail   2. Ingrowing right great toenail      Plan:  Patient was evaluated and treated and all questions answered.    Ingrown Nail, bilaterally -Patient elects to proceed with minor surgery to remove ingrown toenail today. Consent reviewed and signed by patient. -Ingrown nail excised. See procedure note. -Educated on post-procedure care including soaking. Written instructions provided and reviewed. -Rx for Cortisporin sent to pharmacy. -Advised on signs and symptoms of infection developing.  We discussed that the phenol likely will create some redness and edema and tenderness around the nailbed as long as it is localized this is to be expected.  Will return as needed if any infection signs develop  Procedure: Excision of Ingrown Toenail Location: Bilateral 1st toe medial nail borders. Anesthesia: Lidocaine 1% plain; 1.5 mL and Marcaine 0.5% plain; 1.5 mL, digital block. Skin Prep: Betadine. Dressing: Silvadene; telfa; dry, sterile, compression dressing. Technique: Following skin prep, the toe was exsanguinated and a tourniquet was secured at the base of the toe. The affected nail border was freed, split with a nail splitter, and excised. Chemical matrixectomy was then performed with phenol and irrigated out with alcohol. The tourniquet was then removed and sterile dressing applied. Disposition: Patient  tolerated procedure well.    Return if symptoms worsen or fail to improve.

## 2023-01-07 ENCOUNTER — Other Ambulatory Visit (HOSPITAL_COMMUNITY): Payer: Self-pay

## 2023-01-08 ENCOUNTER — Ambulatory Visit (AMBULATORY_SURGERY_CENTER): Payer: 59 | Admitting: *Deleted

## 2023-01-08 ENCOUNTER — Encounter: Payer: Self-pay | Admitting: Gastroenterology

## 2023-01-08 ENCOUNTER — Other Ambulatory Visit (HOSPITAL_COMMUNITY): Payer: Self-pay

## 2023-01-08 VITALS — Ht 64.0 in | Wt 185.0 lb

## 2023-01-08 DIAGNOSIS — Z1211 Encounter for screening for malignant neoplasm of colon: Secondary | ICD-10-CM

## 2023-01-08 MED ORDER — NA SULFATE-K SULFATE-MG SULF 17.5-3.13-1.6 GM/177ML PO SOLN
1.0000 | Freq: Once | ORAL | 0 refills | Status: AC
  Filled 2023-01-08: qty 354, 1d supply, fill #0

## 2023-01-08 NOTE — Progress Notes (Signed)
Pt's previsit is done over the phone and all paperwork (prep instructions) sent to patient. Pt's name and DOB verified at the beginning of the previsit. Pt denies any difficulty with ambulating.  No egg or soy allergy known to patient  No issues known to pt with past sedation with any surgeries or procedures Patient denies ever being intubated No issues moving head ,neck or swallowing No FH of Malignant Hyperthermia Pt is not on diet pills Pt is not on  home 02  Pt is not on blood thinners  Pt has issues with constipation takes stool softer daily and fiber gummy RN instructed pt to use Miralax a capful of water for 5 days before procedure Pt is not on dialysis Pt denies any upcoming cardiac testing Pt encouraged to use to use Singlecare or Goodrx to reduce cost  Patient's chart reviewed by Osvaldo Angst CNRA prior to previsit and patient appropriate for the [facility].  Previsit completed and red dot placed by patient's name on their procedure day (on provider's schedule).  . Visit by phone Pt states her weight is 185lb Instructions reviewed with pt and pt states understanding. Instructed to review again prior to procedure. Pt states they will.  Instructions sent by mail with coupon and by my chart

## 2023-01-11 ENCOUNTER — Other Ambulatory Visit (HOSPITAL_COMMUNITY): Payer: Self-pay

## 2023-01-15 ENCOUNTER — Other Ambulatory Visit (HOSPITAL_COMMUNITY): Payer: Self-pay

## 2023-01-18 ENCOUNTER — Other Ambulatory Visit (HOSPITAL_COMMUNITY): Payer: Self-pay

## 2023-01-22 ENCOUNTER — Ambulatory Visit (AMBULATORY_SURGERY_CENTER): Payer: 59 | Admitting: Gastroenterology

## 2023-01-22 ENCOUNTER — Encounter: Payer: Self-pay | Admitting: Gastroenterology

## 2023-01-22 VITALS — BP 127/88 | HR 54 | Temp 98.6°F | Resp 11 | Ht 64.0 in | Wt 185.0 lb

## 2023-01-22 DIAGNOSIS — Z1211 Encounter for screening for malignant neoplasm of colon: Secondary | ICD-10-CM | POA: Diagnosis not present

## 2023-01-22 DIAGNOSIS — I10 Essential (primary) hypertension: Secondary | ICD-10-CM | POA: Diagnosis not present

## 2023-01-22 DIAGNOSIS — J45909 Unspecified asthma, uncomplicated: Secondary | ICD-10-CM | POA: Diagnosis not present

## 2023-01-22 DIAGNOSIS — E669 Obesity, unspecified: Secondary | ICD-10-CM | POA: Diagnosis not present

## 2023-01-22 MED ORDER — SODIUM CHLORIDE 0.9 % IV SOLN: 500.0000 mL | Freq: Once | INTRAVENOUS | Status: DC

## 2023-01-22 NOTE — Op Note (Signed)
[facility] Patient Name: Barbara Dunn Procedure Date: 01/22/2023 11:00 AM MRN: [id number] Endoscopist: Nicki Reaper E. Candis Schatz , MD, [id number] Age: 52 Referring MD:  Date of Birth: [birth year] Gender: Female Account #: [id number] Procedure:                Colonoscopy Indications:              Screening for colorectal malignant neoplasm, This                            is the patient's first colonoscopy Medicines:                Monitored Anesthesia Care Procedure:                Pre-Anesthesia Assessment:                           - Prior to the procedure, a History and Physical                            was performed, and patient medications and                            allergies were reviewed. The patient's tolerance of                            previous anesthesia was also reviewed. The risks                            and benefits of the procedure and the sedation                            options and risks were discussed with the patient.                            All questions were answered, and informed consent                            was obtained. Prior Anticoagulants: The patient has                            taken no anticoagulant or antiplatelet agents. ASA                            Grade Assessment: II - A patient with mild systemic                            disease. After reviewing the risks and benefits,                            the patient was deemed in satisfactory condition to                            undergo the procedure.  After obtaining informed consent, the colonoscope                            was passed under direct vision. Throughout the                            procedure, the patient's blood pressure, pulse, and                            oxygen saturations were monitored continuously. The                            CF [id number] was introduced through the anus                            and advanced to the  the terminal ileum, with                            identification of the appendiceal orifice and IC                            valve. The colonoscopy was performed without                            difficulty. The patient tolerated the procedure                            well. The quality of the bowel preparation was                            good. The terminal ileum, ileocecal valve,                            appendiceal orifice, and rectum were photographed.                            The bowel preparation used was SUPREP via split                            dose instruction. Scope In: 11:17:54 AM Scope Out: 11:31:50 AM Scope Withdrawal Time: 0 hours 9 minutes 0 seconds  Total Procedure Duration: 0 hours 13 minutes 56 seconds  Findings:                 The perianal and digital rectal examinations were                            normal. Pertinent negatives include normal                            sphincter tone and no palpable rectal lesions.                           The colon (entire examined portion) appeared normal.  The terminal ileum appeared normal.                           The retroflexed view of the distal rectum and anal                            verge was normal and showed no anal or rectal                            abnormalities. Complications:            No immediate complications. Estimated Blood Loss:     Estimated blood loss: none. Impression:               - The entire examined colon is normal.                           - The examined portion of the ileum was normal.                           - The distal rectum and anal verge are normal on                            retroflexion view.                           - No specimens collected.                           - The GI Genius (intelligent endoscopy module),                            computer-aided polyp detection system powered by AI                            was utilized to detect  colorectal polyps through                            enhanced visualization during colonoscopy. Recommendation:           - Patient has a contact number available for                            emergencies. The signs and symptoms of potential                            delayed complications were discussed with the                            patient. Return to normal activities tomorrow.                            Written discharge instructions were provided to the                            patient.                           -  Resume previous diet.                           - Continue present medications.                           - Repeat colonoscopy in 10 years for screening                            purposes. Emonie Espericueta E. Candis Schatz, MD 01/22/2023 11:38:25 AM This report has been signed electronically.

## 2023-01-22 NOTE — Progress Notes (Signed)
[facility] History and Physical   Primary Care Physician:  Charlott Rakes, MD   Reason for Procedure:   Colon cancer screening  Plan:    Screening colonoscopy     HPI: Barbara Dunn is a 52 y.o. female undergoing initial average risk screening colonoscopy.  She has no family history of colon cancer and no chronic GI symptoms.    Past Medical History:  Diagnosis Date   Anemia    Arthritis    Asthma    Bronchitis    Chronic kidney disease    Hypertension    Kidney stone    Migraines    Obesity     Past Surgical History:  Procedure Laterality Date   CARDIAC CATHETERIZATION N/A 08/02/2015   Procedure: Left Heart Cath and Coronary Angiography;  Surgeon: Troy Sine, MD;  Location: [facility];  Service: Cardiovascular;  Laterality: N/A;   CERVIX LESION DESTRUCTION     TUBAL LIGATION     TUBAL LIGATION      Prior to Admission medications   Medication Sig Start Date End Date Taking? Authorizing Provider  amLODipine (NORVASC) 2.5 MG tablet Take 1 tablet (2.5 mg total) by mouth daily. 11/20/22  Yes Charlott Rakes, MD  aspirin 81 MG tablet Take 81 mg by mouth daily.   Yes [provider]  BIOTIN PO Take 2 tablets by mouth daily.    Yes [provider]  cetirizine (ZYRTEC) 10 MG tablet Take 10 mg by mouth daily.   Yes [provider]  docusate sodium (COLACE) 100 MG capsule Take 100 mg by mouth as needed.    Yes [provider]  fluticasone (FLONASE) 50 MCG/ACT nasal spray PLACE 2 SPRAYS INTO BOTH NOSTRILS DAILY. 01/11/20  Yes Charlott Rakes, MD  lisinopril (ZESTRIL) 20 MG tablet Take 1 tablet (20 mg total) by mouth daily. 11/20/22  Yes Charlott Rakes, MD  Multiple Vitamins-Minerals (MULTIVITAMIN ADULT PO) Take 1 tablet by mouth daily.    Yes [provider]  norgestimate-ethinyl estradiol (VYLIBRA) 0.25-35 MG-MCG tablet Take 1 tablet by mouth daily. 10/12/22  Yes Newlin, Charlane Ferretti, MD  SUMAtriptan (IMITREX) 50 MG  tablet TAKE 2 TABLETS (100 MG TOTAL) BY MOUTH ONCE FOR 1 DOSE. MAY REPEAT IN 2 HOURS IF HEADACHE PERSISTS, MAX 4 TABLETS/DAY 11/12/22 11/12/23 Yes Newlin, Enobong, MD  topiramate (TOPAMAX) 50 MG tablet Take 1 tablet (50 mg total) by mouth 2 (two) times daily. 11/12/22  Yes Newlin, Charlane Ferretti, MD  triamcinolone cream (KENALOG) 0.1 % APPLY 1 APPLICATION TOPICALLY 2 (TWO) TIMES DAILY. 11/20/22  Yes Charlott Rakes, MD  vitamin C (ASCORBIC ACID) 250 MG tablet Take 250 mg by mouth daily.   Yes [provider]  albuterol (VENTOLIN HFA) 108 (90 Base) MCG/ACT inhaler Inhale 1 puff into the lungs every 6 (six) hours as needed for wheezing and shortness of breath 07/18/22   Charlott Rakes, MD  benzonatate (TESSALON) 100 MG capsule TAKE 1 CAPSULE (100 MG TOTAL) BY MOUTH 3 (THREE) TIMES DAILY. Patient taking differently: Take 100 mg by mouth 2 (two) times daily as needed for cough. 01/11/20   Charlott Rakes, MD  chlorhexidine (PERIDEX) 0.12 % solution Use as directed 5 mLs in the mouth or throat as needed. Patient not taking: Reported on 01/08/2023    [provider]  [employer] FIBER GUMMY BEARS CHILDREN PO Take by mouth.    [provider]  fluticasone (FLOVENT HFA) 110 MCG/ACT inhaler INHALE 1 PUFF INTO THE LUNGS 2 (TWO) TIMES DAILY. 07/18/22  Charlott Rakes, MD  lidocaine (LIDODERM) 5 % Place 1 patch onto the skin daily. Remove & Discard patch within 12 hours or as directed by MD Patient not taking: Reported on 01/22/2023 05/03/22   Argentina Donovan, PA-C  meclizine (ANTIVERT) 25 MG tablet Take 1 tablet (25 mg total) by mouth 3 (three) times daily as needed for dizziness. 05/08/22   Charlott Rakes, MD  methocarbamol (ROBAXIN) 500 MG tablet Take 2 tablets (1,000 mg total) by mouth every 8 (eight) hours as needed for muscle spasms. Patient not taking: Reported on 01/08/2023 05/03/22   Argentina Donovan, PA-C  neomycin-polymyxin-hydrocortisone (CORTISPORIN) 3.5-10000-1 OTIC suspension Apply 1-2 drops  daily after soaking and cover with bandaid 12/13/22   McDonald, Stephan Minister, DPM  ondansetron (ZOFRAN) 4 MG tablet Take 1 tablet (4 mg total) by mouth every 6 (six) hours. Patient not taking: Reported on 01/08/2023 12/07/19   Raylene Everts, MD  oxymetazoline [facility] NASAL SPRAY) 0.05 % nasal spray Place 1 spray into both nostrils 2 (two) times daily. 10/31/21   Charlott Rakes, MD  tamsulosin (FLOMAX) 0.4 MG CAPS capsule Take 1 capsule (0.4 mg total) by mouth daily. Patient not taking: Reported on 01/08/2023 05/03/22   Argentina Donovan, PA-C  diphenhydrAMINE (BENADRYL) 25 mg capsule Take 25 mg by mouth every 6 (six) hours as needed for itching. Reported on 01/23/2016  12/07/19  [provider]    Current Outpatient Medications  Medication Sig Dispense Refill   amLODipine (NORVASC) 2.5 MG tablet Take 1 tablet (2.5 mg total) by mouth daily. 90 tablet 1   aspirin 81 MG tablet Take 81 mg by mouth daily.     BIOTIN PO Take 2 tablets by mouth daily.      cetirizine (ZYRTEC) 10 MG tablet Take 10 mg by mouth daily.     docusate sodium (COLACE) 100 MG capsule Take 100 mg by mouth as needed.      fluticasone (FLONASE) 50 MCG/ACT nasal spray PLACE 2 SPRAYS INTO BOTH NOSTRILS DAILY. 16 g 2   lisinopril (ZESTRIL) 20 MG tablet Take 1 tablet (20 mg total) by mouth daily. 90 tablet 1   Multiple Vitamins-Minerals (MULTIVITAMIN ADULT PO) Take 1 tablet by mouth daily.      norgestimate-ethinyl estradiol (VYLIBRA) 0.25-35 MG-MCG tablet Take 1 tablet by mouth daily. 28 tablet 3   SUMAtriptan (IMITREX) 50 MG tablet TAKE 2 TABLETS (100 MG TOTAL) BY MOUTH ONCE FOR 1 DOSE. MAY REPEAT IN 2 HOURS IF HEADACHE PERSISTS, MAX 4 TABLETS/DAY 10 tablet 6   topiramate (TOPAMAX) 50 MG tablet Take 1 tablet (50 mg total) by mouth 2 (two) times daily. 60 tablet 6   triamcinolone cream (KENALOG) 0.1 % APPLY 1 APPLICATION TOPICALLY 2 (TWO) TIMES DAILY. 30 g 2   vitamin C (ASCORBIC ACID) 250 MG tablet Take 250 mg by mouth daily.      albuterol (VENTOLIN HFA) 108 (90 Base) MCG/ACT inhaler Inhale 1 puff into the lungs every 6 (six) hours as needed for wheezing and shortness of breath 6.7 g 1   benzonatate (TESSALON) 100 MG capsule TAKE 1 CAPSULE (100 MG TOTAL) BY MOUTH 3 (THREE) TIMES DAILY. (Patient taking differently: Take 100 mg by mouth 2 (two) times daily as needed for cough.) 30 capsule 0   chlorhexidine (PERIDEX) 0.12 % solution Use as directed 5 mLs in the mouth or throat as needed. (Patient not taking: Reported on 01/08/2023)     [employer] FIBER GUMMY BEARS CHILDREN PO Take by mouth.  fluticasone (FLOVENT HFA) 110 MCG/ACT inhaler INHALE 1 PUFF INTO THE LUNGS 2 (TWO) TIMES DAILY. 12 g 2   lidocaine (LIDODERM) 5 % Place 1 patch onto the skin daily. Remove & Discard patch within 12 hours or as directed by MD (Patient not taking: Reported on 01/22/2023) 30 patch 0   meclizine (ANTIVERT) 25 MG tablet Take 1 tablet (25 mg total) by mouth 3 (three) times daily as needed for dizziness. 60 tablet 1   methocarbamol (ROBAXIN) 500 MG tablet Take 2 tablets (1,000 mg total) by mouth every 8 (eight) hours as needed for muscle spasms. (Patient not taking: Reported on 01/08/2023) 90 tablet 0   neomycin-polymyxin-hydrocortisone (CORTISPORIN) 3.5-10000-1 OTIC suspension Apply 1-2 drops daily after soaking and cover with bandaid 10 mL 0   ondansetron (ZOFRAN) 4 MG tablet Take 1 tablet (4 mg total) by mouth every 6 (six) hours. (Patient not taking: Reported on 01/08/2023) 12 tablet 0   oxymetazoline (AFRIN NASAL SPRAY) 0.05 % nasal spray Place 1 spray into both nostrils 2 (two) times daily. 30 mL 0   tamsulosin (FLOMAX) 0.4 MG CAPS capsule Take 1 capsule (0.4 mg total) by mouth daily. (Patient not taking: Reported on 01/08/2023) 30 capsule 3   Current Facility-Administered Medications  Medication Dose Route Frequency Provider Last Rate Last Admin   0.9 %  sodium chloride infusion  500 mL Intravenous Once Daryel November, MD        Allergies as of  01/22/2023   (No Known Allergies)    Family History  Problem Relation Age of Onset   Hypertension Mother    Schizophrenia Mother    Schizophrenia Sister    Schizophrenia Brother    Schizophrenia Brother    Colon cancer Neg Hx    Esophageal cancer Neg Hx    Colon polyps Neg Hx    Rectal cancer Neg Hx    Stomach cancer Neg Hx     Social History   Socioeconomic History   Marital status: Single    Spouse name: Not on file   Number of children: Not on file   Years of education: Not on file   Highest education level: Not on file  Occupational History   Not on file  Tobacco Use   Smoking status: Former    Types: Cigarettes    Quit date: 12/15/2003    Years since quitting: 19.1   Smokeless tobacco: Never   Tobacco comments:    quit 10 years ago  Vaping Use   Vaping Use: Never used  Substance and Sexual Activity   Alcohol use: Yes    Alcohol/week: 2.0 standard drinks of alcohol    Types: 1 Glasses of wine, 1 Shots of liquor per week    Comment: Once every 3 months    Drug use: No   Sexual activity: Yes    Birth control/protection: Other-see comments    Comment: tubal  Other Topics Concern   Not on file  Social History Narrative   Pt has GED. CNA as well.    Social Determinants of Health   Financial Resource Strain: Not on file  Food Insecurity: Not on file  Transportation Needs: Not on file  Physical Activity: Not on file  Stress: Not on file  Social Connections: Not on file  Intimate Partner Violence: Not on file    Review of Systems:  All other review of systems negative except as mentioned in the HPI.  Physical Exam: Vital signs BP (!) 113/43   Pulse  71   Temp 98.6 F (37 C)   Ht 5\' 4"  (1.626 m)   Wt 185 lb (83.9 kg)   LMP 01/09/2023   SpO2 100%   BMI 31.76 kg/m   General:   Alert,  Well-developed, well-nourished, pleasant and cooperative in NAD Airway:  Mallampati 2 Lungs:  Clear throughout to auscultation.   Heart:  Regular rate and  rhythm; no murmurs, clicks, rubs,  or gallops. Abdomen:  Soft, nontender and nondistended. Normal bowel sounds.   Neuro/Psych:  Normal mood and affect. A and O x 3   Murlean Seelye E. Candis Schatz, MD [facility]

## 2023-01-22 NOTE — Progress Notes (Signed)
A and O x3. Report to RN. Tolerated MAC anesthesia well. 

## 2023-01-22 NOTE — Progress Notes (Signed)
VS by CW  Pt's states no medical or surgical changes since previsit or office visit.  

## 2023-01-22 NOTE — Patient Instructions (Signed)
Resume previous diet. Continue present medications. Repeat colonoscopy in 10 years for screening purposes.   YOU HAD AN ENDOSCOPIC PROCEDURE TODAY AT THE [facility]:   Refer to the procedure report that was given to you for any specific questions about what was found during the examination.  If the procedure report does not answer your questions, please call your gastroenterologist to clarify.  If you requested that your care partner not be given the details of your procedure findings, then the procedure report has been included in a sealed envelope for you to review at your convenience later.  YOU SHOULD EXPECT: Some feelings of bloating in the abdomen. Passage of more gas than usual.  Walking can help get rid of the air that was put into your GI tract during the procedure and reduce the bloating. If you had a lower endoscopy (such as a colonoscopy or flexible sigmoidoscopy) you may notice spotting of blood in your stool or on the toilet paper. If you underwent a bowel prep for your procedure, you may not have a normal bowel movement for a few days.  Please Note:  You might notice some irritation and congestion in your nose or some drainage.  This is from the oxygen used during your procedure.  There is no need for concern and it should clear up in a day or so.  SYMPTOMS TO REPORT IMMEDIATELY:  Following lower endoscopy (colonoscopy or flexible sigmoidoscopy):  Excessive amounts of blood in the stool  Significant tenderness or worsening of abdominal pains  Swelling of the abdomen that is new, acute  Fever of 100F or higher  For urgent or emergent issues, a gastroenterologist can be reached at any hour by calling [phone]. Do not use MyChart messaging for urgent concerns.    DIET:  We do recommend a small meal at first, but then you may proceed to your regular diet.  Drink plenty of fluids but you should avoid alcoholic beverages for 24 hours.  ACTIVITY:  You should plan  to take it easy for the rest of today and you should NOT DRIVE or use heavy machinery until tomorrow (because of the sedation medicines used during the test).    FOLLOW UP: Our staff will call the number listed on your records the next business day following your procedure.  We will call around 7:15- 8:00 am to check on you and address any questions or concerns that you may have regarding the information given to you following your procedure. If we do not reach you, we will leave a message.     If any biopsies were taken you will be contacted by phone or by letter within the next 1-3 weeks.  Please call us at [phone] if you have not heard about the biopsies in 3 weeks.    SIGNATURES/CONFIDENTIALITY: You and/or your care partner have signed paperwork which will be entered into your electronic medical record.  These signatures attest to the fact that that the information above on your After Visit Summary has been reviewed and is understood.  Full responsibility of the confidentiality of this discharge information lies with you and/or your care-partner. 

## 2023-01-23 ENCOUNTER — Telehealth: Payer: Self-pay

## 2023-01-23 NOTE — Telephone Encounter (Signed)
  Follow up Call-     01/22/2023   10:25 AM  Call back number  Post procedure Call Back phone  # [phone]  Permission to leave phone message Yes     Patient questions:  Do you have a fever, pain , or abdominal swelling? No. Pain Score  0 *  Have you tolerated food without any problems? Yes.    Have you been able to return to your normal activities? Yes.    Do you have any questions about your discharge instructions: Diet   No. Medications  No. Follow up visit  No.  Do you have questions or concerns about your Care? No.  Actions: * If pain score is 4 or above: No action needed, pain <4.

## 2023-01-31 ENCOUNTER — Encounter (HOSPITAL_COMMUNITY): Payer: Self-pay

## 2023-01-31 ENCOUNTER — Ambulatory Visit (HOSPITAL_COMMUNITY)
Admission: EM | Admit: 2023-01-31 | Discharge: 2023-01-31 | Disposition: A | Discharge status: Home / Self Care | Payer: 59

## 2023-01-31 ENCOUNTER — Emergency Department (HOSPITAL_COMMUNITY)
Admission: EM | Admit: 2023-01-31 | Discharge: 2023-01-31 | Disposition: A | Discharge status: Home / Self Care | Payer: 59 | Attending: Emergency Medicine | Admitting: Emergency Medicine

## 2023-01-31 DIAGNOSIS — M79661 Pain in right lower leg: Secondary | ICD-10-CM

## 2023-01-31 DIAGNOSIS — Z7982 Long term (current) use of aspirin: Secondary | ICD-10-CM | POA: Diagnosis not present

## 2023-01-31 DIAGNOSIS — M79604 Pain in right leg: Secondary | ICD-10-CM | POA: Insufficient documentation

## 2023-01-31 MED ORDER — ENOXAPARIN SODIUM 80 MG/0.8ML IJ SOSY
80.0000 mg | PREFILLED_SYRINGE | Freq: Once | INTRAMUSCULAR | Status: AC
  Administered 2023-01-31: 80 mg via SUBCUTANEOUS
  Filled 2023-01-31: qty 0.8

## 2023-01-31 NOTE — ED Triage Notes (Signed)
Pt states that she has been having R calf pain since Monday, pain now shoots up her leg, no redness warmth or swelling.

## 2023-01-31 NOTE — ED Provider Notes (Signed)
[facility] EMERGENCY DEPARTMENT AT [facility] Provider Note   CSN: [id number] Arrival date & time: 01/31/23  1920     History  Chief Complaint  Patient presents with   Leg Pain    Barbara Dunn is a 52 y.o. female.  The history is provided by the patient and medical records. No language interpreter was used.  Leg Pain    52 year old female who is currently on birth control pills, presenting complaining of right leg pain.  She endorsed pain to her right leg and calf ongoing for the past 3 days.  Pain is moderate in severity nothing seems make it better or worse.  Denies any fever or chills no numbness no weakness no back pain.  She tries taking ibuprofen without adequate relief.  No prior history of DVT.  She went to urgent care today but was sent here for further evaluation.  Home Medications Prior to Admission medications   Medication Sig Start Date End Date Taking? Authorizing Provider  albuterol (VENTOLIN HFA) 108 (90 Base) MCG/ACT inhaler Inhale 1 puff into the lungs every 6 (six) hours as needed for wheezing and shortness of breath 07/18/22   Hoy Register, MD  amLODipine (NORVASC) 2.5 MG tablet Take 1 tablet (2.5 mg total) by mouth daily. 11/20/22   Hoy Register, MD  aspirin 81 MG tablet Take 81 mg by mouth daily.    [provider]  benzonatate (TESSALON) 100 MG capsule TAKE 1 CAPSULE (100 MG TOTAL) BY MOUTH 3 (THREE) TIMES DAILY. Patient taking differently: Take 100 mg by mouth 2 (two) times daily as needed for cough. 01/11/20   Hoy Register, MD  BIOTIN PO Take 2 tablets by mouth daily.     [provider]  cetirizine (ZYRTEC) 10 MG tablet Take 10 mg by mouth daily.    [provider]  chlorhexidine (PERIDEX) 0.12 % solution Use as directed 5 mLs in the mouth or throat as needed. Patient not taking: Reported on 01/08/2023    [provider]  [employer] FIBER GUMMY BEARS CHILDREN PO Take by mouth.    [provider]   docusate sodium (COLACE) 100 MG capsule Take 100 mg by mouth as needed.     [provider]  fluticasone (FLONASE) 50 MCG/ACT nasal spray PLACE 2 SPRAYS INTO BOTH NOSTRILS DAILY. 01/11/20   Hoy Register, MD  fluticasone (FLOVENT HFA) 110 MCG/ACT inhaler INHALE 1 PUFF INTO THE LUNGS 2 (TWO) TIMES DAILY. 07/18/22   Hoy Register, MD  lidocaine (LIDODERM) 5 % Place 1 patch onto the skin daily. Remove & Discard patch within 12 hours or as directed by MD Patient not taking: Reported on 01/22/2023 05/03/22   Anders Simmonds, PA-C  lisinopril (ZESTRIL) 20 MG tablet Take 1 tablet (20 mg total) by mouth daily. 11/20/22   Hoy Register, MD  meclizine (ANTIVERT) 25 MG tablet Take 1 tablet (25 mg total) by mouth 3 (three) times daily as needed for dizziness. 05/08/22   Hoy Register, MD  methocarbamol (ROBAXIN) 500 MG tablet Take 2 tablets (1,000 mg total) by mouth every 8 (eight) hours as needed for muscle spasms. Patient not taking: Reported on 01/08/2023 05/03/22   Anders Simmonds, PA-C  Multiple Vitamins-Minerals (MULTIVITAMIN ADULT PO) Take 1 tablet by mouth daily.     [provider]  neomycin-polymyxin-hydrocortisone (CORTISPORIN) 3.5-10000-1 OTIC suspension Apply 1-2 drops daily after soaking and cover with bandaid 12/13/22   Edwin Cap, DPM  norgestimate-ethinyl estradiol (VYLIBRA) 0.25-35 MG-MCG tablet Take  1 tablet by mouth daily. 10/12/22   Hoy Register, MD  ondansetron (ZOFRAN) 4 MG tablet Take 1 tablet (4 mg total) by mouth every 6 (six) hours. Patient not taking: Reported on 01/08/2023 12/07/19   Eustace Moore, MD  oxymetazoline [facility] NASAL SPRAY) 0.05 % nasal spray Place 1 spray into both nostrils 2 (two) times daily. 10/31/21   Hoy Register, MD  SUMAtriptan (IMITREX) 50 MG tablet TAKE 2 TABLETS (100 MG TOTAL) BY MOUTH ONCE FOR 1 DOSE. MAY REPEAT IN 2 HOURS IF HEADACHE PERSISTS, MAX 4 TABLETS/DAY 11/12/22 11/12/23  Hoy Register, MD  tamsulosin (FLOMAX) 0.4  MG CAPS capsule Take 1 capsule (0.4 mg total) by mouth daily. Patient not taking: Reported on 01/08/2023 05/03/22   Anders Simmonds, PA-C  topiramate (TOPAMAX) 50 MG tablet Take 1 tablet (50 mg total) by mouth 2 (two) times daily. 11/12/22   Hoy Register, MD  triamcinolone cream (KENALOG) 0.1 % APPLY 1 APPLICATION TOPICALLY 2 (TWO) TIMES DAILY. 11/20/22   Hoy Register, MD  vitamin C (ASCORBIC ACID) 250 MG tablet Take 250 mg by mouth daily.    [provider]  diphenhydrAMINE (BENADRYL) 25 mg capsule Take 25 mg by mouth every 6 (six) hours as needed for itching. Reported on 01/23/2016  12/07/19  [provider]      Allergies    Patient has no known allergies.    Review of Systems   Review of Systems  All other systems reviewed and are negative.   Physical Exam Updated Vital Signs BP (!) 156/92   Pulse 76   Temp 98.3 F (36.8 C) (Oral)   Resp 18   LMP 01/09/2023   SpO2 100%  Physical Exam Vitals and nursing note reviewed.  Constitutional:      General: She is not in acute distress.    Appearance: She is well-developed.  HENT:     Head: Atraumatic.  Eyes:     Conjunctiva/sclera: Conjunctivae normal.  Pulmonary:     Effort: Pulmonary effort is normal.  Musculoskeletal:        General: Tenderness (Tenderness to palpation of right lower extremity without focal point tenderness no significant edema erythema or warmth noted.  No deformity noted.) present.     Cervical back: Neck supple.  Skin:    Capillary Refill: Capillary refill takes less than 2 seconds.     Findings: No rash.  Neurological:     Mental Status: She is alert.  Psychiatric:        Mood and Affect: Mood normal.     ED Results / Procedures / Treatments   Labs (all labs ordered are listed, but only abnormal results are displayed) Labs Reviewed - No data to display  EKG None  Radiology No results found.  Procedures Procedures    Medications Ordered in ED Medications   enoxaparin (LOVENOX) injection 85 mg (has no administration in time range)    ED Course/ Medical Decision Making/ A&P                             Medical Decision Making  BP (!) 156/92   Pulse 76   Temp 98.3 F (36.8 C) (Oral)   Resp 18   LMP 01/09/2023   SpO2 100%   3:44 PM  52 year old female who is currently on birth control pills, presenting complaining of right leg pain.  She endorsed pain to her right leg and calf ongoing for the  past 3 days.  Pain is moderate in severity nothing seems make it better or worse.  Denies any fever or chills no numbness no weakness no back pain.  She tries taking ibuprofen without adequate relief.  No prior history of DVT.  She went to urgent care today but was sent here for further evaluation.  On exam this is a well-appearing female resting comfortably in bed appears to be in no acute discomfort.  She has tenderness about her right leg including the foot and the calf without any significant edema erythema or warmth.  No deformity noted.  She is neurovascular intact.  She ambulate without difficulty.  Due to atraumatic neck and calf tenderness and patient is on oral birth control pill, will give Lovenox weight-based and encourage patient to return tomorrow for a DVT study as it is not available at this time.  Doubt cellulitis fracture or dislocation or claudication.        Final Clinical Impression(s) / ED Diagnoses Final diagnoses:  Right leg pain    Rx / DC Orders ED Discharge Orders          Ordered    LE [facility]ENOUS        01/31/23 2004              Fayrene Helperran, Zaiyah Sottile, PA-C 01/31/23 2005    Cathren LaineSteinl, Kevin, MD 01/31/23 2216

## 2023-01-31 NOTE — Discharge Instructions (Signed)
Please return tomorrow and request for a DVT study of your right lower extremity to ensure you do not have any evidence of blood clot.

## 2023-01-31 NOTE — ED Triage Notes (Signed)
Patient c/o right calf pain x 3 days. Patient denies any swelling or injury to the calf.

## 2023-01-31 NOTE — Discharge Instructions (Signed)
Please go to the emergency department as soon as you leave urgent care for further evaluation and management. ?

## 2023-01-31 NOTE — ED Provider Notes (Signed)
[facility]    CSN: [id number] Arrival date & time: 01/31/23  1645      History   Chief Complaint No chief complaint on file.   HPI Barbara Dunn is a 52 y.o. female.   Patient presents with right calf pain that has been present for 3 days.  Denies numbness or tingling.  Denies any injury.  Denies any recent long distance travel in a car or plane.  Has taken ibuprofen for pain with minimal improvement.  Denies history of chronic leg pain.     Past Medical History:  Diagnosis Date   Anemia    Arthritis    Asthma    Bronchitis    Chronic kidney disease    Hypertension    Kidney stone    Migraines    Obesity     Patient Active Problem List   Diagnosis Date Noted   Pulmonary nodule 01/24/2022   Asthma 01/24/2022   Paroxysmal SVT (supraventricular tachycardia) 04/21/2020   Personal history of COVID-19 04/21/2020   Seasonal allergies 01/23/2016   Oral contraceptive use 10/19/2015   Non-cardiac chest pain    Palpitations    Abnormal nuclear stress test    Dizziness and giddiness 05/30/2015   Chest pain 05/30/2015   Knee contusion 05/04/2015   Vaginal discharge 02/12/2015   Abnormal uterine bleeding 02/11/2015   Right brachial plexitis 09/03/2014   Shortness of breath 04/20/2014   Pain in left knee 04/20/2014   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   Candidal intertrigo 11/09/2013   Health care maintenance 11/09/2013   Hair loss 09/15/2013   Pain in right shoulder 08/18/2013   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   Hypertension 07/13/2012   Leg pain 03/27/2012   Migraine without aura 01/17/2007   OBESITY, NOS 12/19/2006    Past Surgical History:  Procedure Laterality Date   CARDIAC CATHETERIZATION N/A 08/02/2015   Procedure: Left Heart Cath and Coronary Angiography;  Surgeon: Lennette Biharihomas A Kelly, MD;  [facility];  Service: Cardiovascular;  Laterality: N/A;   CERVIX LESION DESTRUCTION     TUBAL LIGATION     TUBAL LIGATION      OB History    No obstetric history on file.      Home Medications    Prior to Admission medications   Medication Sig Start Date End Date Taking? Authorizing Provider  albuterol (VENTOLIN HFA) 108 (90 Base) MCG/ACT inhaler Inhale 1 puff into the lungs every 6 (six) hours as needed for wheezing and shortness of breath 07/18/22   Hoy RegisterNewlin, Enobong, MD  amLODipine (NORVASC) 2.5 MG tablet Take 1 tablet (2.5 mg total) by mouth daily. 11/20/22   Hoy RegisterNewlin, Enobong, MD  aspirin 81 MG tablet Take 81 mg by mouth daily.    [provider]  benzonatate (TESSALON) 100 MG capsule TAKE 1 CAPSULE (100 MG TOTAL) BY MOUTH 3 (THREE) TIMES DAILY. Patient taking differently: Take 100 mg by mouth 2 (two) times daily as needed for cough. 01/11/20   Hoy RegisterNewlin, Enobong, MD  BIOTIN PO Take 2 tablets by mouth daily.     [provider]  cetirizine (ZYRTEC) 10 MG tablet Take 10 mg by mouth daily.    [provider]  chlorhexidine (PERIDEX) 0.12 % solution Use as directed 5 mLs in the mouth or throat as needed. Patient not taking: Reported on 01/08/2023    [provider]  [employer] FIBER GUMMY BEARS CHILDREN PO Take by mouth.    [provider]  docusate sodium (COLACE) 100  MG capsule Take 100 mg by mouth as needed.     [provider]  fluticasone (FLONASE) 50 MCG/ACT nasal spray PLACE 2 SPRAYS INTO BOTH NOSTRILS DAILY. 01/11/20   Hoy Register, MD  fluticasone (FLOVENT HFA) 110 MCG/ACT inhaler INHALE 1 PUFF INTO THE LUNGS 2 (TWO) TIMES DAILY. 07/18/22   Hoy Register, MD  lidocaine (LIDODERM) 5 % Place 1 patch onto the skin daily. Remove & Discard patch within 12 hours or as directed by MD Patient not taking: Reported on 01/22/2023 05/03/22   Anders Simmonds, PA-C  lisinopril (ZESTRIL) 20 MG tablet Take 1 tablet (20 mg total) by mouth daily. 11/20/22   Hoy Register, MD  meclizine (ANTIVERT) 25 MG tablet Take 1 tablet (25 mg total) by mouth 3 (three) times daily as needed for dizziness.  05/08/22   Hoy Register, MD  methocarbamol (ROBAXIN) 500 MG tablet Take 2 tablets (1,000 mg total) by mouth every 8 (eight) hours as needed for muscle spasms. Patient not taking: Reported on 01/08/2023 05/03/22   Anders Simmonds, PA-C  Multiple Vitamins-Minerals (MULTIVITAMIN ADULT PO) Take 1 tablet by mouth daily.     [provider]  neomycin-polymyxin-hydrocortisone (CORTISPORIN) 3.5-10000-1 OTIC suspension Apply 1-2 drops daily after soaking and cover with bandaid 12/13/22   McDonald, Rachelle Hora, DPM  norgestimate-ethinyl estradiol (VYLIBRA) 0.25-35 MG-MCG tablet Take 1 tablet by mouth daily. 10/12/22   Hoy Register, MD  ondansetron (ZOFRAN) 4 MG tablet Take 1 tablet (4 mg total) by mouth every 6 (six) hours. Patient not taking: Reported on 01/08/2023 12/07/19   Eustace Moore, MD  oxymetazoline [facility] NASAL SPRAY) 0.05 % nasal spray Place 1 spray into both nostrils 2 (two) times daily. 10/31/21   Hoy Register, MD  SUMAtriptan (IMITREX) 50 MG tablet TAKE 2 TABLETS (100 MG TOTAL) BY MOUTH ONCE FOR 1 DOSE. MAY REPEAT IN 2 HOURS IF HEADACHE PERSISTS, MAX 4 TABLETS/DAY 11/12/22 11/12/23  Hoy Register, MD  tamsulosin (FLOMAX) 0.4 MG CAPS capsule Take 1 capsule (0.4 mg total) by mouth daily. Patient not taking: Reported on 01/08/2023 05/03/22   Anders Simmonds, PA-C  topiramate (TOPAMAX) 50 MG tablet Take 1 tablet (50 mg total) by mouth 2 (two) times daily. 11/12/22   Hoy Register, MD  triamcinolone cream (KENALOG) 0.1 % APPLY 1 APPLICATION TOPICALLY 2 (TWO) TIMES DAILY. 11/20/22   Hoy Register, MD  vitamin C (ASCORBIC ACID) 250 MG tablet Take 250 mg by mouth daily.    [provider]  diphenhydrAMINE (BENADRYL) 25 mg capsule Take 25 mg by mouth every 6 (six) hours as needed for itching. Reported on 01/23/2016  12/07/19  [provider]    Family History Family History  Problem Relation Age of Onset   Hypertension Mother    Schizophrenia Mother    Schizophrenia  Sister    Schizophrenia Brother    Schizophrenia Brother    Colon cancer Neg Hx    Esophageal cancer Neg Hx    Colon polyps Neg Hx    Rectal cancer Neg Hx    Stomach cancer Neg Hx     Social History Social History   Tobacco Use   Smoking status: Former    Types: Cigarettes    Quit date: 12/15/2003    Years since quitting: 19.1   Smokeless tobacco: Never   Tobacco comments:    quit 10 years ago  Vaping Use   Vaping Use: Never used  Substance Use Topics   Alcohol use: Yes  Alcohol/week: 2.0 standard drinks of alcohol    Types: 1 Glasses of wine, 1 Shots of liquor per week    Comment: Once every 3 months    Drug use: No     Allergies   Patient has no known allergies.   Review of Systems Review of Systems Per HPI  Physical Exam Triage Vital Signs ED Triage Vitals [01/31/23 1821]  Enc Vitals Group     BP 133/73     Pulse Rate 72     Resp 16     Temp 98.8 F (37.1 C)     Temp Source Oral     SpO2 98 %     Weight      Height      Head Circumference      Peak Flow      Pain Score 10     Pain Loc      Pain Edu?      Excl. in GC?    No data found.  Updated Vital Signs BP 133/73 (BP Location: Left Arm)   Pulse 72   Temp 98.8 F (37.1 C) (Oral)   Resp 16   LMP 01/09/2023   SpO2 98%   Visual Acuity Right Eye Distance:   Left Eye Distance:   Bilateral Distance:    Right Eye Near:   Left Eye Near:    Bilateral Near:     Physical Exam Constitutional:      General: She is not in acute distress.    Appearance: Normal appearance. She is not toxic-appearing or diaphoretic.  HENT:     Head: Normocephalic and atraumatic.  Eyes:     Extraocular Movements: Extraocular movements intact.     Conjunctiva/sclera: Conjunctivae normal.  Pulmonary:     Effort: Pulmonary effort is normal.  Musculoskeletal:     Comments: Tenderness to palpation throughout right calf.  No obvious significant swelling, discoloration, warmth noted.  Patient can wiggle toes.   Capillary refill and pulses intact.  Neurovascularly intact.  Neurological:     General: No focal deficit present.     Mental Status: She is alert and oriented to person, place, and time. Mental status is at baseline.  Psychiatric:        Mood and Affect: Mood normal.        Behavior: Behavior normal.        Thought Content: Thought content normal.        Judgment: Judgment normal.      UC Treatments / Results  Labs (all labs ordered are listed, but only abnormal results are displayed) Labs Reviewed - No data to display  EKG   Radiology No results found.  Procedures Procedures (including critical care time)  Medications Ordered in UC Medications - No data to display  Initial Impression / Assessment and Plan / UC Course  I have reviewed the triage vital signs and the nursing notes.  Pertinent labs & imaging results that were available during my care of the patient were reviewed by me and considered in my medical decision making (see chart for details).     Differential diagnoses include muscular pain versus DVT.  I do think that DVT needs to be ruled out given patient is having calf pain.  Do not have ultrasound capabilities here in urgent care at this time.  Therefore, patient was advised to go to the ER for further evaluation immediately.  Patient was agreeable with this plan.  Vital signs stable at discharge.  Agree  with patient self transport to the ER. Final Clinical Impressions(s) / UC Diagnoses   Final diagnoses:  Right calf pain     Discharge Instructions      Please go to the emergency department as soon as you leave urgent care for further evaluation and management.    ED Prescriptions   None    PDMP not reviewed this encounter.   Gustavus Bryant, [place] 01/31/23 1836

## 2023-02-01 ENCOUNTER — Ambulatory Visit (HOSPITAL_COMMUNITY)
Admission: RE | Admit: 2023-02-01 | Discharge: 2023-02-01 | Disposition: A | Discharge status: Home / Self Care | Payer: 59 | Source: Ambulatory Visit | Attending: Emergency Medicine | Admitting: Emergency Medicine

## 2023-02-01 ENCOUNTER — Ambulatory Visit (HOSPITAL_BASED_OUTPATIENT_CLINIC_OR_DEPARTMENT_OTHER)
Admission: RE | Admit: 2023-02-01 | Discharge: 2023-02-01 | Disposition: A | Discharge status: Home / Self Care | Payer: 59 | Source: Ambulatory Visit | Attending: Vascular Surgery | Admitting: Vascular Surgery

## 2023-02-01 ENCOUNTER — Other Ambulatory Visit (HOSPITAL_COMMUNITY): Payer: Self-pay

## 2023-02-01 ENCOUNTER — Encounter (HOSPITAL_COMMUNITY): Payer: Self-pay

## 2023-02-01 VITALS — BP 127/85 | HR 80

## 2023-02-01 DIAGNOSIS — M79661 Pain in right lower leg: Secondary | ICD-10-CM

## 2023-02-01 DIAGNOSIS — I82451 Acute embolism and thrombosis of right peroneal vein: Secondary | ICD-10-CM | POA: Insufficient documentation

## 2023-02-01 MED ORDER — APIXABAN (ELIQUIS) VTE STARTER PACK (10MG AND 5MG)
ORAL_TABLET | ORAL | 0 refills | Status: DC
  Filled 2023-02-01: qty 74, 30d supply, fill #0

## 2023-02-01 MED ORDER — APIXABAN 5 MG PO TABS
5.0000 mg | ORAL_TABLET | Freq: Two times a day (BID) | ORAL | 4 refills | Status: DC
  Filled 2023-02-01 – 2023-03-01 (×2): qty 60, 30d supply, fill #0
  Filled 2023-03-29: qty 60, 30d supply, fill #1
  Filled 2023-04-29: qty 60, 30d supply, fill #2

## 2023-02-01 NOTE — Patient Instructions (Signed)
-  Start apixaban (Eliquis) 10 mg twice daily for 7 days followed by 5 mg twice daily. -Your refills have been sent to [facility]. You may need to call the pharmacy to ask them to fill this when you start to run low on your current supply.  -It is important to take your medication around the same time every day.  -Avoid NSAIDs like ibuprofen (Advil, Motrin) and naproxen (Aleve) as well as aspirin doses over 100 mg daily. -Tylenol (acetaminophen) is the preferred over the counter pain medication to lower the risk of bleeding. -Be sure to alert all of your health care providers that you are taking an anticoagulant prior to starting a new medication or having a procedure. -Monitor for signs and symptoms of bleeding (abnormal bruising, prolonged bleeding, nose bleeds, bleeding from gums, discolored urine, black tarry stools). If you have fallen and hit your head OR if your bleeding is severe or not stopping, seek emergency care.  -Go to the emergency room if emergent signs and symptoms of new clot occur (new or worse swelling and pain in an arm or leg, shortness of breath, chest pain, fast or irregular heartbeats, lightheadedness, dizziness, fainting, coughing up blood) or if you experience a significant color change (pale or blue) in the extremity that has the DVT.  -We recommend you wear compression stockings (20-30 mmHg) as long as you are having swelling or pain. Be sure to purchase the correct size and take them off at night.   Your next visit will be in 1 month.  [facility] [address] [place], [place], [place] [address] Enter the hospital through Entrance [address] off [address] and pull up to the Heart & Vascular Center entrance to the free valet parking.  Check in for your appointment at the Heart & Vascular Center.   If you have any questions or need to reschedule an appointment, please call [phone] [facility].  If you are having an emergency, call 911  or present to the nearest emergency room.   What is a DVT?  -Deep vein thrombosis (DVT) is a condition in which a blood clot forms in a vein of the deep venous system which can occur in the lower leg, thigh, pelvis, arm, or neck. This condition is serious and can be life-threatening if the clot travels to the arteries of the lungs and causing a blockage (pulmonary embolism, PE). A DVT can also damage veins in the leg, which can lead to long-term venous disease, leg pain, swelling, discoloration, and ulcers or sores (post-thrombotic syndrome).  -Treatment may include taking an anticoagulant medication to prevent more clots from forming and the current clot from growing, wearing compression stockings, and/or surgical procedures to remove or dissolve the clot.

## 2023-02-01 NOTE — Progress Notes (Addendum)
DVT Clinic Note  Name: Barbara Dunn     MRN: [id number]     DOB: May 11, 1971     Sex: female  PCP: Hoy Register, MD  Today's Visit: Visit Information: Initial Visit  Referred to DVT Clinic by: Fayrene Helper, PA-C [facility])  Referred to CPP by: Dr. Karin Lieu Reason for referral:  Chief Complaint  Patient presents with   DVT   HISTORY OF PRESENT ILLNESS:  Barbara Dunn is a 52 y.o. female with PMH HTN, migraine without aura, who presents after diagnosis of DVT for medication management. She began to have pain in her R calf 4 days ago. She was seen in the ED yesterday who gave her a dose of Lovenox and had her follow up today with vascular imaging. [place] was positive for acute DVT of the right peroneal veins. She reports she has some family members with blood clots. She doesn't think her mother had any. She does not know her father nor his medical history. She is up to date on all cancer screenings. She takes a combined oral contraceptive for bleeding. Has been on this "for a long time" - per chart review >10 years. Denies chest pain, SOB.   Positive Thrombotic Risk Factors: Estrogen therapy (but has been on current birth control for >10 years) Bleeding Risk Factors: Antiplatelet therapy  Negative Thrombotic Risk Factors: Previous VTE, Recent surgery (within 3 months), Recent trauma (within 3 months), Recent admission to hospital with acute illness (within 3 months), Sedentary journey lasting >8 hours within 4 weeks, Paralysis, paresis, or recent plaster cast immobilization of lower extremity, Central venous catheterization, Pregnancy, Recent COVID diagnosis (within 3 months), Recent cesarean section (within 3 months), Testosterone therapy, Older age, Obesity, Smoking, Active cancer, Known thrombophilic condition, Within 6 weeks postpartum, Erythropoiesis-stimulating agent, Bed rest >72 hours within 3 months, Non-malignant, chronic inflammatory condition  Rx Insurance Coverage: Commercial Rx  Affordability: Copay for Eliquis is $15/month. Activated copay card during visit to reduce cost to $10/month.  Preferred Pharmacy: Filled Eliquis starter pack during the visit at [facility]. Ref[facility] to [facility]   Past Medical History:  Diagnosis Date   Anemia    Arthritis    Asthma    Bronchitis    Chronic kidney disease    Hypertension    Kidney stone    Migraines    Obesity     Past Surgical History:  Procedure Laterality Date   CARDIAC CATHETERIZATION N/A 08/02/2015   Procedure: Left Heart Cath and Coronary Angiography;  Surgeon: Lennette Bihari, MD;  [facility];  Service: Cardiovascular;  Laterality: N/A;   CERVIX LESION DESTRUCTION     TUBAL LIGATION     TUBAL LIGATION      Social History   Socioeconomic History   Marital status: Single    Spouse name: Not on file   Number of children: Not on file   Years of education: Not on file   Highest education level: Not on file  Occupational History   Not on file  Tobacco Use   Smoking status: Former    Types: Cigarettes    Quit date: 12/15/2003    Years since quitting: 19.1   Smokeless tobacco: Never   Tobacco comments:    quit 10 years ago  Vaping Use   Vaping Use: Never used  Substance and Sexual Activity   Alcohol use: Yes    Alcohol/week: 2.0 standard drinks of alcohol  Types: 1 Glasses of wine, 1 Shots of liquor per week    Comment: Once every 3 months    Drug use: No   Sexual activity: Yes    Birth control/protection: Other-see comments    Comment: tubal  Other Topics Concern   Not on file  Social History Narrative   Pt has GED. CNA as well.    Social Determinants of Health   Financial Resource Strain: Not on file  Food Insecurity: Not on file  Transportation Needs: Not on file  Physical Activity: Not on file  Stress: Not on file  Social Connections: Not on file  Intimate Partner Violence: Not on file    Family History   Problem Relation Age of Onset   Hypertension Mother    Schizophrenia Mother    Schizophrenia Sister    Schizophrenia Brother    Schizophrenia Brother    Colon cancer Neg Hx    Esophageal cancer Neg Hx    Colon polyps Neg Hx    Rectal cancer Neg Hx    Stomach cancer Neg Hx     Allergies as of 02/01/2023   (No Known Allergies)    Current Outpatient Medications on File Prior to Encounter  Medication Sig Dispense Refill   albuterol (VENTOLIN HFA) 108 (90 Base) MCG/ACT inhaler Inhale 1 puff into the lungs every 6 (six) hours as needed for wheezing and shortness of breath 6.7 g 1   amLODipine (NORVASC) 2.5 MG tablet Take 1 tablet (2.5 mg total) by mouth daily. 90 tablet 1   APPLE CIDER VINEGAR PO Take 1 capsule by mouth daily.     aspirin 81 MG tablet Take 81 mg by mouth daily.     BIOTIN PO Take 2 tablets by mouth daily.      cetirizine (ZYRTEC) 10 MG tablet Take 10 mg by mouth daily.     [employer] FIBER GUMMY BEARS CHILDREN PO Take by mouth.     docusate sodium (COLACE) 100 MG capsule Take 100 mg by mouth as needed.      ELDERBERRY PO Take by mouth.     fluticasone (FLONASE) 50 MCG/ACT nasal spray PLACE 2 SPRAYS INTO BOTH NOSTRILS DAILY. 16 g 2   fluticasone (FLOVENT HFA) 110 MCG/ACT inhaler INHALE 1 PUFF INTO THE LUNGS 2 (TWO) TIMES DAILY. 12 g 2   lisinopril (ZESTRIL) 20 MG tablet Take 1 tablet (20 mg total) by mouth daily. 90 tablet 1   meclizine (ANTIVERT) 25 MG tablet Take 1 tablet (25 mg total) by mouth 3 (three) times daily as needed for dizziness. 60 tablet 1   Multiple Vitamins-Minerals (MULTIVITAMIN ADULT PO) Take 1 tablet by mouth daily.      neomycin-polymyxin-hydrocortisone (CORTISPORIN) 3.5-10000-1 OTIC suspension Apply 1-2 drops daily after soaking and cover with bandaid 10 mL 0   norgestimate-ethinyl estradiol (VYLIBRA) 0.25-35 MG-MCG tablet Take 1 tablet by mouth daily. 28 tablet 3   oxymetazoline (AFRIN NASAL SPRAY) 0.05 % nasal spray Place 1 spray into both nostrils 2  (two) times daily. 30 mL 0   topiramate (TOPAMAX) 50 MG tablet Take 1 tablet (50 mg total) by mouth 2 (two) times daily. 60 tablet 6   triamcinolone cream (KENALOG) 0.1 % APPLY 1 APPLICATION TOPICALLY 2 (TWO) TIMES DAILY. 30 g 2   vitamin C (ASCORBIC ACID) 250 MG tablet Take 250 mg by mouth daily.     SUMAtriptan (IMITREX) 50 MG tablet TAKE 2 TABLETS (100 MG TOTAL) BY MOUTH ONCE FOR 1 DOSE. MAY REPEAT IN  2 HOURS IF HEADACHE PERSISTS, MAX 4 TABLETS/DAY 10 tablet 6   [DISCONTINUED] diphenhydrAMINE (BENADRYL) 25 mg capsule Take 25 mg by mouth every 6 (six) hours as needed for itching. Reported on 01/23/2016     No current facility-administered medications on file prior to encounter.   REVIEW OF SYSTEMS:  Review of Systems  Respiratory:  Negative for shortness of breath.   Cardiovascular:  Positive for leg swelling. Negative for chest pain and palpitations.  Musculoskeletal:  Positive for myalgias.  Neurological:  Negative for dizziness and tingling.   PHYSICAL EXAMINATION:  Vitals:   02/01/23 1459  BP: 127/85  Pulse: 80  SpO2: 100%   Physical Exam Vitals reviewed.  Cardiovascular:     Rate and Rhythm: Normal rate.  Pulmonary:     Effort: Pulmonary effort is normal.  Musculoskeletal:        General: Tenderness present.     Right lower leg: Edema (trace) present.  Skin:    Findings: No erythema.  Psychiatric:        Mood and Affect: Mood normal.        Behavior: Behavior normal.        Thought Content: Thought content normal.   Villalta Score for Post-Thrombotic Syndrome: Pain: Severe Cramps: Mild Heaviness: Absent Paresthesia: Absent Pruritus: Absent Pretibial Edema: Mild Skin Induration: Absent Hyperpigmentation: Absent Redness: Absent Venous Ectasia: Absent Pain on calf compression: Mild Villalta Preliminary Score: 6 Is venous ulcer present?: No If venous ulcer is present and score is <15, then 15 points total are assigned: Absent Villalta Total Score: 6  LABS:   CBC     Component Value Date/Time   WBC 5.8 11/20/2022 0917   WBC 5.7 01/10/2016 0833   RBC 4.03 11/20/2022 0917   RBC 4.15 01/10/2016 0833   HGB 12.0 11/20/2022 0917   HCT 35.4 11/20/2022 0917   PLT 245 11/20/2022 0917   MCV 88 11/20/2022 0917   MCH 29.8 11/20/2022 0917   MCH 28.9 01/10/2016 0833   MCHC 33.9 11/20/2022 0917   MCHC 32.0 01/10/2016 0833   RDW 11.5 (L) 11/20/2022 0917   LYMPHSABS 2.1 11/20/2022 0917   MONOABS 0.7 10/19/2015 1008   EOSABS 0.1 11/20/2022 0917   BASOSABS 0.1 11/20/2022 0917    Hepatic Function      Component Value Date/Time   PROT 7.1 11/20/2022 0917   ALBUMIN 4.3 11/20/2022 0917   AST 26 11/20/2022 0917   ALT 15 11/20/2022 0917   ALKPHOS 71 11/20/2022 0917   BILITOT 0.4 11/20/2022 0917    Renal Function   Lab Results  Component Value Date   CREATININE 0.91 11/20/2022   CREATININE 0.97 12/26/2021   CREATININE 0.98 07/19/2021    CrCl cannot be calculated (Patient's most recent lab result is older than the maximum 21 days allowed.).   [facility] Vascular Lab Studies:  02/01/23 VAS [place] LOWER EXTREMITY VENOUS (DVT)RIGHT  Summary:  RIGHT:  - Findings consistent with acute deep vein thrombosis involving the right  peroneal veins.  - No cystic structure found in the popliteal fossa.    LEFT:  - No evidence of common femoral vein obstruction.   ASSESSMENT: Location of DVT: Right distal vein Cause of DVT: unprovoked. No strong provoking risk factors found during patient interview or chart review. Patient is currently taking an estrogen containing oral contraceptive. She has been on this for >10 years so it is not a strong provoking risk factor for DVT. However, the patient will need to discuss benefits and  risks of continuation with her PCP as estrogen is not recommended in patients with a history of VTE. Will route chart to patient's PCP to see if sooner follow up is needed. Patient reports a family history of DVT and requests further work up to see  if this could be genetic so will refer the patient to hematology. Optimal duration of treatment for first unprovoked distal DVT is not known, so would appreciate hematology recommendations on duration of anticoagulation as well.   PLAN: -Start apixaban (Eliquis) 10 mg twice daily for 7 days followed by 5 mg twice daily. -Expected duration of therapy: Will defer to hematology. Therapy started on 02/01/23. -Patient educated on purpose, proper use and potential adverse effects of apixaban (Eliquis). -Discussed importance of taking medication around the same time every day. -Advised patient of medications to avoid (NSAIDs, aspirin doses >100 mg daily). -Educated that Tylenol (acetaminophen) is the preferred analgesic to lower the risk of bleeding. -Advised patient to alert all providers of anticoagulation therapy prior to starting a new medication or having a procedure. -Emphasized importance of monitoring for signs and symptoms of bleeding (abnormal bruising, prolonged bleeding, nose bleeds, bleeding from gums, discolored urine, black tarry stools). -Educated patient to present to the ED if emergent signs and symptoms of new thrombosis occur. -Counseled patient to wear compression stockings daily, removing at night. -Consider holding aspirin 81 mg (used for primary prevention only) while on Eliquis.   Follow up: 1 month in DVT Clinic. Referral to hematology placed.   Pervis Hocking, PharmD, [place], CPP Deep Vein Thrombosis Clinic Clinical Pharmacist Practitioner Office: [phone]  I have evaluated the patient's chart/imaging and refer this patient to the Clinical Pharmacist Practitioner for medication management. I have reviewed the CPP's documentation and agree with her assessment and plan. I was immediately available during the visit for questions and collaboration.   Victorino Sparrow, MD

## 2023-02-01 NOTE — Progress Notes (Signed)
Lower extremity venous right study completed.  Patient referred to DVT Clinic for appointment at 3pm today.   See CV Proc for preliminary results report.   Jean Rosenthal, RDMS, RVT

## 2023-02-04 ENCOUNTER — Telehealth: Payer: Self-pay | Admitting: Oncology

## 2023-02-04 ENCOUNTER — Other Ambulatory Visit (HOSPITAL_COMMUNITY): Payer: Self-pay

## 2023-02-04 ENCOUNTER — Other Ambulatory Visit: Payer: Self-pay | Admitting: Family Medicine

## 2023-02-04 DIAGNOSIS — Z3041 Encounter for surveillance of contraceptive pills: Secondary | ICD-10-CM

## 2023-02-04 NOTE — Telephone Encounter (Signed)
scheduled per 4/12 referral , pt has been called and confirmed date and time. Pt is aware of location and to arrive early for check in   

## 2023-02-05 ENCOUNTER — Other Ambulatory Visit (HOSPITAL_COMMUNITY): Payer: Self-pay

## 2023-02-05 MED ORDER — NORGESTIMATE-ETH ESTRADIOL 0.25-35 MG-MCG PO TABS
1.0000 | ORAL_TABLET | Freq: Every day | ORAL | 3 refills | Status: DC
  Filled 2023-02-05: qty 28, 28d supply, fill #0
  Filled 2023-03-07: qty 28, 28d supply, fill #1

## 2023-02-05 NOTE — Telephone Encounter (Signed)
Requested Prescriptions  Pending Prescriptions Disp Refills   norgestimate-ethinyl estradiol (VYLIBRA) 0.25-35 MG-MCG tablet 84 tablet 3    Sig: Take 1 tablet by mouth daily.     OB/GYN:  Contraceptives Passed - 02/04/2023  7:36 AM      Passed - Last BP in normal range    BP Readings from Last 1 Encounters:  02/01/23 127/85         Passed - Valid encounter within last 12 months    Recent Outpatient Visits           2 months ago Annual visit for general adult medical examination with abnormal findings   [facility] Hoy Register, MD   6 months ago Vaginal discharge   [facility] Hoy Register, MD   9 months ago Muscle spasm   [facility] [place], [place], [place]   1 year ago Left upper quadrant abdominal pain   [facility] Hoy Register, MD   1 year ago Flank pain   [facility] [place], [place], [place]       Future Appointments             In 3 months Hoy Register, MD [facility]            Passed - Patient is not a smoker

## 2023-02-07 ENCOUNTER — Other Ambulatory Visit (HOSPITAL_COMMUNITY): Payer: Self-pay

## 2023-02-11 ENCOUNTER — Ambulatory Visit: Payer: Self-pay | Admitting: *Deleted

## 2023-02-11 ENCOUNTER — Other Ambulatory Visit (HOSPITAL_COMMUNITY): Payer: Self-pay

## 2023-02-11 DIAGNOSIS — N281 Cyst of kidney, acquired: Secondary | ICD-10-CM | POA: Diagnosis not present

## 2023-02-11 DIAGNOSIS — N3281 Overactive bladder: Secondary | ICD-10-CM | POA: Diagnosis not present

## 2023-02-11 DIAGNOSIS — D49512 Neoplasm of unspecified behavior of left kidney: Secondary | ICD-10-CM | POA: Diagnosis not present

## 2023-02-11 DIAGNOSIS — N200 Calculus of kidney: Secondary | ICD-10-CM | POA: Diagnosis not present

## 2023-02-11 NOTE — Telephone Encounter (Signed)
  Chief Complaint: red stool- beet juice vs blood thinner Symptoms: patient states she is not sure what is causing her to have red stool- she did drink some beet juice Saturday and reports no blood in urine before that- she has been on eliquis for 2 weeks and she is still taking OCP Frequency: Started Saturday Pertinent Negatives: Patient denies abdomen pain, diarrhea, jaundice, fever Disposition: ED /[] Urgent Care (no appt availability in office) / Appointment(In office/virtual)/  [facility]/ Home Care/ Refused Recommended Disposition /[] [facility]/  Follow-up with PCP Additional Notes: Appointment has been scheduled due to new start blood thinner

## 2023-02-11 NOTE — Telephone Encounter (Signed)
Summary: Red Stool Advice   Pt is calling to report that she ate beets over the weekend. Pt reports that her BM is red. She is not sure if it is blood in her stool or if that is from the beets. Please advise     Reason for Disposition  [1] Abnormal color is unexplained AND [2] persists > 24 hours  Answer Assessment - Initial Assessment Questions 1. COLOR: "What color is it?" "Is that color in part or all of the stool?"     Red- water red, stool 2. ONSET: "When was the unusual color first noted?"     Saturday, Sunday, Today 3. CAUSE: "Have you eaten any food or taken any medicine of this color?" Note: See listing in Background Information section.      Beet juice- Saturday 4. OTHER SYMPTOMS: "Do you have any other symptoms?" (e.g., abdomen pain, diarrhea, jaundice, fever).     no  Protocols used: Stools - Unusual Color-A-AH

## 2023-02-12 ENCOUNTER — Ambulatory Visit: Payer: 59 | Attending: Internal Medicine | Admitting: Internal Medicine

## 2023-02-12 ENCOUNTER — Encounter: Payer: Self-pay | Admitting: Internal Medicine

## 2023-02-12 ENCOUNTER — Other Ambulatory Visit: Payer: Self-pay | Admitting: Urology

## 2023-02-12 VITALS — BP 130/84 | HR 65 | Temp 98.6°F | Ht 64.0 in | Wt 187.0 lb

## 2023-02-12 DIAGNOSIS — K921 Melena: Secondary | ICD-10-CM

## 2023-02-12 DIAGNOSIS — D49512 Neoplasm of unspecified behavior of left kidney: Secondary | ICD-10-CM

## 2023-02-12 DIAGNOSIS — M545 Low back pain, unspecified: Secondary | ICD-10-CM

## 2023-02-12 LAB — POCT URINALYSIS DIP (CLINITEK)
Bilirubin, UA: NEGATIVE
Blood, UA: NEGATIVE
Glucose, UA: NEGATIVE mg/dL
Nitrite, UA: NEGATIVE
POC PROTEIN,UA: 30 — AB
Spec Grav, UA: 1.02 (ref 1.010–1.025)
Urobilinogen, UA: 2 E.U./dL — AB
pH, UA: 7 (ref 5.0–8.0)

## 2023-02-12 NOTE — Progress Notes (Signed)
Patient ID: Barbara Dunn, female    DOB: [birth year]  MRN: [id number]  CC: Stool Color Change (Stool color change - reports clearing up. /Pt was informed that there was bacteria in urine - no meds were prescribed. )   Subjective: Barbara Dunn is a 52 y.o. female who presents for UC visit Her concerns today include:  history of hypertension, migraine,   Pt reports "red" bowel movements that started 4 days ago.  Had BM every day for the past 4 days and each 1 was red.  Cleared up today.  Drank some beet juice several hours before first episode occurred.  She thinks this may have caused it.   No stomach pain or rectal pains.  On Eliquis x 2 wks for DVT in RT leg. Has appt with onc 03/01/2023.   No fhx of colon CA C-scope 01/22/2023 that was completely normal.  Seen at [facility] yesterday.  Told she had bacteria in urine.  Patient states she was not given antibiotics and she wanted to be checked to see if she has a UTI. No dysuria or urinary frequency.  However she does have some pain at times on her right side.  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   Shortness of breath 04/20/2014   Pain in left knee 04/20/2014   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   Candidal intertrigo 11/09/2013   Health care maintenance 11/09/2013   Hair loss 09/15/2013   Pain in right shoulder 08/18/2013   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 90 tablet 1   apixaban (ELIQUIS) 5 MG TABS tablet Take 1 tablet (5 mg total) by mouth 2 (two) times daily. Start taking after completion of starter pack. 60 tablet 4   APIXABAN (ELIQUIS) VTE STARTER PACK (10MG  AND 5MG ) Take as directed on package: start with two-5mg  tablets twice daily for 7 days. On day 8, switch to one-5mg  tablet twice daily. 74 each 0   APPLE CIDER VINEGAR PO Take 1 capsule by mouth daily.     aspirin 81 MG tablet Take 81 mg by mouth daily.     BIOTIN PO Take 2 tablets by mouth daily.      cetirizine (ZYRTEC) 10 MG tablet Take 10 mg by mouth daily.     [employer] FIBER GUMMY BEARS CHILDREN PO Take by mouth.     docusate sodium (COLACE) 100 MG capsule Take 100 mg by mouth as needed.  ELDERBERRY PO Take by mouth.     fluticasone (FLONASE) 50 MCG/ACT nasal spray PLACE 2 SPRAYS INTO BOTH NOSTRILS DAILY. 16 g 2   fluticasone (FLOVENT HFA) 110 MCG/ACT inhaler INHALE 1 PUFF INTO THE LUNGS 2 (TWO) TIMES DAILY. 12 g 2   lisinopril (ZESTRIL) 20 MG tablet Take 1 tablet (20 mg total) by mouth daily. 90 tablet 1   meclizine (ANTIVERT) 25 MG tablet Take 1 tablet (25 mg total) by mouth 3 (three) times daily as needed for dizziness. 60 tablet 1   Multiple Vitamins-Minerals (MULTIVITAMIN ADULT PO) Take 1 tablet by mouth daily.      neomycin-polymyxin-hydrocortisone (CORTISPORIN) 3.5-10000-1 OTIC suspension Apply 1-2 drops daily after soaking and cover with bandaid 10 mL 0   norgestimate-ethinyl estradiol (VYLIBRA) 0.25-35 MG-MCG tablet Take 1 tablet by mouth  daily. 84 tablet 3   oxymetazoline (AFRIN NASAL SPRAY) 0.05 % nasal spray Place 1 spray into both nostrils 2 (two) times daily. 30 mL 0   SUMAtriptan (IMITREX) 50 MG tablet TAKE 2 TABLETS (100 MG TOTAL) BY MOUTH ONCE FOR 1 DOSE. MAY REPEAT IN 2 HOURS IF HEADACHE PERSISTS, MAX 4 TABLETS/DAY 10 tablet 6   topiramate (TOPAMAX) 50 MG tablet Take 1 tablet (50 mg total) by mouth 2 (two) times daily. 60 tablet 6   triamcinolone cream (KENALOG) 0.1 % APPLY 1 APPLICATION TOPICALLY 2 (TWO) TIMES DAILY. 30 g 2   vitamin C (ASCORBIC ACID) 250 MG tablet Take 250 mg by mouth daily.     [DISCONTINUED] diphenhydrAMINE (BENADRYL) 25 mg capsule Take 25 mg by mouth every 6 (six) hours as needed for itching. Reported on 01/23/2016     No current facility-administered medications on file prior to visit.    No Known Allergies  Social History   Socioeconomic History   Marital status: Single    Spouse name: Not on file   Number of children: Not on file   Years of education: Not on file   Highest education level: Not on file  Occupational History   Not on file  Tobacco Use   Smoking status: Former    Types: Cigarettes    Quit date: 12/15/2003    Years since quitting: 19.1   Smokeless tobacco: Never   Tobacco comments:    quit 10 years ago  Vaping Use   Vaping Use: Never used  Substance and Sexual Activity   Alcohol use: Yes    Alcohol/week: 2.0 standard drinks of alcohol    Types: 1 Glasses of wine, 1 Shots of liquor per week    Comment: Once every 3 months    Drug use: No   Sexual activity: Yes    Birth control/protection: Other-see comments    Comment: tubal  Other Topics Concern   Not on file  Social History Narrative   Pt has GED. CNA as well.    Social Determinants of Health   Financial Resource Strain: Not on file  Food Insecurity: Not on file  Transportation Needs: Not on file  Physical Activity: Not on file  Stress: Not on file  Social Connections: Not on file  Intimate Partner  Violence: Not on file    Family History  Problem Relation Age of Onset   Hypertension Mother    Schizophrenia Mother    Schizophrenia Sister    Schizophrenia Brother    Schizophrenia Brother    Colon cancer Neg Hx    Esophageal cancer Neg Hx    Colon polyps Neg Hx  Rectal cancer Neg Hx    Stomach cancer Neg Hx     Past Surgical History:  Procedure Laterality Date   CARDIAC CATHETERIZATION N/A 08/02/2015   Procedure: Left Heart Cath and Coronary Angiography;  Surgeon: Lennette Bihari, MD;  Location: [facility] INVASIVE [facility];  Service: Cardiovascular;  Laterality: N/A;   CERVIX LESION DESTRUCTION     TUBAL LIGATION     TUBAL LIGATION      ROS: Review of Systems Negative except as stated above  PHYSICAL EXAM: BP 130/84 (BP Location: Left Arm, Patient Position: Sitting, Cuff Size: Normal)   Pulse 65   Temp 98.6 F (37 C) (Oral)   Ht  (1.626 m)   Wt 187 lb (84.8 kg)   LMP 01/09/2023   SpO2 100%   BMI 32.10 kg/m   Physical Exam  General appearance - alert, well appearing, and in no distress Mental status - normal mood, behavior, speech, dress, motor activity, and thought processes Rectal -CMA Clarissa present: No external rectal lesions or hemorrhoids noted.  Small amount of [place] stool in the rectal vault.  Stool did not appear red.  Hemoccult was negative.      Latest Ref Rng & Units 11/20/2022    9:17 AM 12/26/2021    8:35 AM 07/19/2021    9:14 AM  CMP  Glucose 70 - 99 mg/dL 87  88  85   BUN 6 - 24 mg/dL Creatinine 0.57 - 1.00 mg/dL 1.61  0.96  0.45   Sodium 134 - 144 mmol/L 140  136  140   Potassium 3.5 - 5.2 mmol/L 3.9  4.2  4.0   Chloride 96 - 106 mmol/L 105  103  106   CO2 20 - 29 mmol/L Calcium 8.7 - 10.2 mg/dL 9.1  8.8  8.6   Total Protein 6.0 - 8.5 g/dL 7.1  6.8  7.0   Total Bilirubin 0.0 - 1.2 mg/dL 0.4  0.3  0.3   Alkaline Phos 44 - 121 IU/L 71  58  51   AST 0 - 40 IU/L ALT 0 - 32 IU/L Lipid Panel      Component Value Date/Time   CHOL 222 (H) 11/20/2022 0917   TRIG 63 11/20/2022 0917   HDL 123 11/20/2022 0917   CHOLHDL 2.1 11/02/2019 1009   CHOLHDL 1.8 07/27/2016 0917   VLDL 14 07/27/2016 0917   LDLCALC 88 11/20/2022 0917    CBC    Component Value Date/Time   WBC 5.8 11/20/2022 0917   WBC 5.7 01/10/2016 0833   RBC 4.03 11/20/2022 0917   RBC 4.15 01/10/2016 0833   HGB 12.0 11/20/2022 0917   HCT 35.4 11/20/2022 0917   PLT 245 11/20/2022 0917   MCV 88 11/20/2022 0917   MCH 29.8 11/20/2022 0917   MCH 28.9 01/10/2016 0833   MCHC 33.9 11/20/2022 0917   MCHC 32.0 01/10/2016 0833   RDW 11.5 (L) 11/20/2022 0917   LYMPHSABS 2.1 11/20/2022 0917   MONOABS 0.7 10/19/2015 1008   EOSABS 0.1 11/20/2022 0917   BASOSABS 0.1 11/20/2022 0917   Results for orders placed or performed in visit on 02/12/23  POCT URINALYSIS DIP (CLINITEK)  Result Value Ref Range   Color, UA yellow yellow   Clarity, UA clear clear   Glucose, UA negative negative mg/dL   Bilirubin,  UA negative negative   Ketones, POC UA trace (5) (A) negative mg/dL   Spec Grav, UA 6.962 9.528 - 1.025   Blood, UA negative negative   pH, UA 7.0 5.0 - 8.0   POC PROTEIN,UA =30 (A) negative, trace   Urobilinogen, UA 2.0 (A) 0.2 or 1.0 E.U./dL   Nitrite, UA Negative Negative   Leukocytes, UA Trace (A) Negative     ASSESSMENT AND PLAN:  1. Symptom of blood in stool -Patient reports red stools for the past 3 to 4 days that cleared today.  She is on Eliquis.  She denies any abdominal pain or dizziness.  She drank some beet juice 4 days ago.  This may or may not be the cause.  Hemoccult was negative.  Recent colonoscopy was negative.  Advised patient that if she has another episode she should let us know so that we can have her see the gastroenterologist again.  We will check a CBC today.  Lab was closed by the time we were finished so she agrees to come back to the lab tomorrow. - CBC; Future  2. Acute right-sided low back  pain, unspecified whether sciatica present Negative for UTI on point-of-care UA. - POCT URINALYSIS DIP (CLINITEK)    Patient was given the opportunity to ask questions.  Patient verbalized understanding of the plan and was able to repeat key elements of the plan.   This documentation was completed using Paediatric nurse.  Any transcriptional errors are unintentional.  Orders Placed This Encounter  Procedures   CBC   POCT URINALYSIS DIP (CLINITEK)     Requested Prescriptions    No prescriptions requested or ordered in this encounter    No follow-ups on file.  Jonah Blue, MD, FACP

## 2023-02-15 ENCOUNTER — Ambulatory Visit: Payer: 59 | Attending: Family Medicine

## 2023-02-15 DIAGNOSIS — K921 Melena: Secondary | ICD-10-CM | POA: Diagnosis not present

## 2023-02-16 LAB — CBC
Hematocrit: 36.9 % (ref 34.0–46.6)
Hemoglobin: 11.6 g/dL (ref 11.1–15.9)
MCH: 28.4 pg (ref 26.6–33.0)
MCHC: 31.4 g/dL — ABNORMAL LOW (ref 31.5–35.7)
MCV: 90 fL (ref 79–97)
Platelets: 265 10*3/uL (ref 150–450)
RBC: 4.08 x10E6/uL (ref 3.77–5.28)
RDW: 12.1 % (ref 11.7–15.4)
WBC: 6.8 10*3/uL (ref 3.4–10.8)

## 2023-02-18 ENCOUNTER — Encounter: Payer: Self-pay | Admitting: Urology

## 2023-02-28 NOTE — Progress Notes (Signed)
[facility] Cancer Initial Visit:  Patient Care Team: Hoy Register, MD as PCP - General (Family Medicine) Jodelle Red, MD as PCP - Cardiology (Cardiology) Linna Darner, RD as Dietitian (Family Medicine)  CHIEF COMPLAINTS/PURPOSE OF CONSULTATION:  HISTORY OF PRESENTING ILLNESS: Barbara Dunn 52 y.o. female is here because of   VTE Medical history notable for arthritis, asthma, chronic kidney disease, hypertension, obesity, nephrolithiasis, tubal ligations  November 20, 2022: CMP notable for bicarb of 19 creatinine 0.91  January 31, 2023: Presented to [facility] health emergency room with right leg and calf pain x 3 days.  Patient was on oral contraceptives at the time.  She was given an injection of Lovenox On the following day underwent lower extremity ultrasound and was found to have acute DVT involving right peroneal veins.  She was subsidy placed on Eliquis.  February 15, 2023: WBC 6.8 hemoglobin 11.6 MCV 90 platelet count 265   Mar 01 2023:  [facility]  She is G5 P3023 with last pregnancy delivered 27 years ago.  Patient had no prior history of VTE.  She had a tubal ligation following the last pregnancy but has been on OCP's to control her menses, else she had menorrhagia.  Can not identify a precipitating event  Social:  Works as a Lawyer.  Tobacco quit 15 yrs ago.  EtOH was drinking twice weekly while on her diet.  Caretaker for multiple family members  [facility] Mother died 47 bone disease, schizophrenia Father estranged Brother died 31 hit by car  Brother died 25 was homeless, HIV Brother alive 73 HTN  Brother alive 42 HTN schizophrenia Sister alive 42 schizophrenia   Review of Systems  Constitutional:  Negative for appetite change, chills, fatigue, fever and unexpected weight change.  HENT:   Negative for mouth sores, nosebleeds, sore throat, trouble swallowing and voice change.   Eyes:  Negative for eye problems and icterus.       Vision  changes:  None  Respiratory:  Negative for chest tightness, shortness of breath and wheezing.        Rarely has cough productive of sputum with scant hemoptysis  Cardiovascular:  Negative for chest pain, leg swelling and palpitations.       PND:  none Orthopnea:  none  Gastrointestinal:  Negative for abdominal distention, abdominal pain, blood in stool, diarrhea, nausea and vomiting.       Chronic constipation for which she takes stool softener  Endocrine: Negative for hot flashes.       Cold intolerance:  none Heat intolerance:  none  Genitourinary:  Negative for bladder incontinence, difficulty urinating, dysuria, frequency, hematuria and nocturia.   Musculoskeletal:  Negative for arthralgias, back pain, gait problem, myalgias, neck pain and neck stiffness.  Skin:  Negative for itching, rash and wound.  Neurological:  Negative for dizziness, extremity weakness, gait problem, headaches, light-headedness and speech difficulty.       Occasional numbness of hands  Hematological:  Negative for adenopathy. Does not bruise/bleed easily.  Psychiatric/Behavioral:  Negative for suicidal ideas. The patient is not nervous/anxious.        Does not sleep well at night    MEDICAL HISTORY: Past Medical History:  Diagnosis Date   Anemia    Arthritis    Asthma    Bronchitis    Chronic kidney disease    Hypertension    Kidney stone    Migraines    Obesity     SURGICAL HISTORY: Past Surgical History:  Procedure Laterality Date   CARDIAC CATHETERIZATION N/A 08/02/2015   Procedure: Left Heart Cath and Coronary Angiography;  Surgeon: Lennette Bihari, MD;  Location: [facility] INVASIVE CV LAB;  Service: Cardiovascular;  Laterality: N/A;   CERVIX LESION DESTRUCTION     TUBAL LIGATION     TUBAL LIGATION      SOCIAL HISTORY: Social History   Socioeconomic History   Marital status: Single    Spouse name: Not on file   Number of children: Not on file   Years of education: Not on file   Highest  education level: Not on file  Occupational History   Not on file  Tobacco Use   Smoking status: Former    Types: Cigarettes    Quit date: 12/15/2003    Years since quitting: 19.2   Smokeless tobacco: Never   Tobacco comments:    quit 10 years ago  Vaping Use   Vaping Use: Never used  Substance and Sexual Activity   Alcohol use: Yes    Alcohol/week: 2.0 standard drinks of alcohol    Types: 1 Glasses of wine, 1 Shots of liquor per week    Comment: Once every 3 months    Drug use: No   Sexual activity: Yes    Birth control/protection: Other-see comments    Comment: tubal  Other Topics Concern   Not on file  Social History Narrative   Pt has GED. CNA as well.    Social Determinants of Health   Financial Resource Strain: Not on file  Food Insecurity: Not on file  Transportation Needs: Not on file  Physical Activity: Not on file  Stress: Not on file  Social Connections: Not on file  Intimate Partner Violence: Not on file    FAMILY HISTORY Family History  Problem Relation Age of Onset   Hypertension Mother    Schizophrenia Mother    Schizophrenia Sister    Schizophrenia Brother    Schizophrenia Brother    Colon cancer Neg Hx    Esophageal cancer Neg Hx    Colon polyps Neg Hx    Rectal cancer Neg Hx    Stomach cancer Neg Hx     ALLERGIES:  has No Known Allergies.  MEDICATIONS:  Current Outpatient Medications  Medication Sig Dispense Refill   albuterol (VENTOLIN HFA) 108 (90 Base) MCG/ACT inhaler Inhale 1 puff into the lungs every 6 (six) hours as needed for wheezing and shortness of breath 6.7 g 1   amLODipine (NORVASC) 2.5 MG tablet Take 1 tablet (2.5 mg total) by mouth daily. 90 tablet 1   apixaban (ELIQUIS) 5 MG TABS tablet Take 1 tablet (5 mg total) by mouth 2 (two) times daily. Start taking after completion of starter pack. 60 tablet 4   APPLE CIDER VINEGAR PO Take 1 capsule by mouth daily.     aspirin 81 MG tablet Take 81 mg by mouth daily.     BIOTIN PO  Take 2 tablets by mouth daily.      cetirizine (ZYRTEC) 10 MG tablet Take 10 mg by mouth daily.     [employer] FIBER GUMMY BEARS CHILDREN PO Take by mouth.     docusate sodium (COLACE) 100 MG capsule Take 100 mg by mouth as needed.      ELDERBERRY PO Take by mouth.     fluticasone (FLONASE) 50 MCG/ACT nasal spray PLACE 2 SPRAYS INTO BOTH NOSTRILS DAILY. 16 g 2   fluticasone (FLOVENT HFA) 110 MCG/ACT inhaler INHALE 1 PUFF INTO THE  LUNGS 2 (TWO) TIMES DAILY. 12 g 2   lisinopril (ZESTRIL) 20 MG tablet Take 1 tablet (20 mg total) by mouth daily. 90 tablet 1   meclizine (ANTIVERT) 25 MG tablet Take 1 tablet (25 mg total) by mouth 3 (three) times daily as needed for dizziness. 60 tablet 1   Multiple Vitamins-Minerals (MULTIVITAMIN ADULT PO) Take 1 tablet by mouth daily.      neomycin-polymyxin-hydrocortisone (CORTISPORIN) 3.5-10000-1 OTIC suspension Apply 1-2 drops daily after soaking and cover with bandaid 10 mL 0   norgestimate-ethinyl estradiol (VYLIBRA) 0.25-35 MG-MCG tablet Take 1 tablet by mouth daily. 84 tablet 3   oxymetazoline (AFRIN NASAL SPRAY) 0.05 % nasal spray Place 1 spray into both nostrils 2 (two) times daily. 30 mL 0   SUMAtriptan (IMITREX) 50 MG tablet TAKE 2 TABLETS (100 MG TOTAL) BY MOUTH ONCE FOR 1 DOSE. MAY REPEAT IN 2 HOURS IF HEADACHE PERSISTS, MAX 4 TABLETS/DAY 10 tablet 6   topiramate (TOPAMAX) 50 MG tablet Take 1 tablet (50 mg total) by mouth 2 (two) times daily. 60 tablet 6   triamcinolone cream (KENALOG) 0.1 % APPLY 1 APPLICATION TOPICALLY 2 (TWO) TIMES DAILY. 30 g 2   vitamin C (ASCORBIC ACID) 250 MG tablet Take 250 mg by mouth daily.     No current facility-administered medications for this visit.    PHYSICAL EXAMINATION:  ECOG PERFORMANCE STATUS: 0 - Asymptomatic   Vitals:   03/01/23 1115  BP: 127/82  Pulse: 69  Resp: 15  Temp: 98.1 F (36.7 C)  SpO2: 100%    Filed Weights   03/01/23 1115  Weight: 186 lb 9.6 oz (84.6 kg)     Physical Exam Vitals and  nursing note reviewed.  Constitutional:      Appearance: Normal appearance. She is not toxic-appearing or diaphoretic.     Comments: Here alone  HENT:     Head: Normocephalic and atraumatic.     Right Ear: External ear normal.     Left Ear: External ear normal.     Nose: Nose normal. No congestion or rhinorrhea.  Eyes:     General: No scleral icterus.    Extraocular Movements: Extraocular movements intact.     Conjunctiva/sclera: Conjunctivae normal.     Pupils: Pupils are equal, round, and reactive to light.  Cardiovascular:     Rate and Rhythm: Normal rate and regular rhythm.     Heart sounds: No murmur heard.    No friction rub. No gallop.  Pulmonary:     Effort: Pulmonary effort is normal. No respiratory distress.     Breath sounds: Normal breath sounds. No stridor. No wheezing or rhonchi.  Abdominal:     General: Bowel sounds are normal.     Palpations: Abdomen is soft.     Tenderness: There is no abdominal tenderness. There is no guarding or rebound.  Musculoskeletal:        General: No swelling, tenderness or deformity.     Cervical back: Normal range of motion and neck supple. No rigidity or tenderness.     Right lower leg: No edema.     Left lower leg: No edema.  Lymphadenopathy:     Head:     Right side of head: No submental, submandibular, tonsillar, preauricular, posterior auricular or occipital adenopathy.     Left side of head: No submental, submandibular, tonsillar, preauricular, posterior auricular or occipital adenopathy.     Cervical: No cervical adenopathy.     Right cervical: No superficial, deep or  posterior cervical adenopathy.    Left cervical: No superficial, deep or posterior cervical adenopathy.     Upper Body:     Right upper body: No supraclavicular, axillary, pectoral or epitrochlear adenopathy.     Left upper body: No supraclavicular, axillary, pectoral or epitrochlear adenopathy.  Skin:    General: Skin is warm.     Coloration: Skin is not  jaundiced.     Findings: No bruising or erythema.  Neurological:     General: No focal deficit present.     Mental Status: She is alert and oriented to person, place, and time.     Cranial Nerves: No cranial nerve deficit.     Motor: No weakness.  Psychiatric:        Mood and Affect: Mood normal.        Behavior: Behavior normal.        Thought Content: Thought content normal.        Judgment: Judgment normal.     LABORATORY DATA: I have personally reviewed the data as listed:  Appointment on 03/01/2023  Component Date Value Ref Range Status   Prothrombin Time 03/01/2023 13.7  11.4 - 15.2 seconds Final   INR 03/01/2023 1.0  0.8 - 1.2 Final   Comment: (NOTE) INR goal varies based on device and disease states. Performed at [facility], [address], [place], [place] [address]    aPTT 03/01/2023 27  24 - 36 seconds Final   Performed at [facility], [address] [place], [place] [address]   WBC Count 03/01/2023 6.4  4.0 - 10.5 K/uL Final   RBC 03/01/2023 3.92  3.87 - 5.11 MIL/uL Final   Hemoglobin 03/01/2023 11.5 (L)  12.0 - 15.0 g/dL Final   HCT 82/95/6213 35.4 (L)  36.0 - 46.0 % Final   MCV 03/01/2023 90.3  80.0 - 100.0 fL Final   MCH 03/01/2023 29.3  26.0 - 34.0 pg Final   MCHC 03/01/2023 32.5  30.0 - 36.0 g/dL Final   RDW 08/65/7846 13.8  11.5 - 15.5 % Final   Platelet Count 03/01/2023 250  150 - 400 K/uL Final   nRBC 03/01/2023 0.0  0.0 - 0.2 % Final   Neutrophils Relative % 03/01/2023 52  % Final   Neutro Abs 03/01/2023 3.3  1.7 - 7.7 K/uL Final   Lymphocytes Relative 03/01/2023 34  % Final   Lymphs Abs 03/01/2023 2.2  0.7 - 4.0 K/uL Final   Monocytes Relative 03/01/2023 10  % Final   Monocytes Absolute 03/01/2023 0.6  0.1 - 1.0 K/uL Final   Eosinophils Relative 03/01/2023 3  % Final   Eosinophils Absolute 03/01/2023 0.2  0.0 - 0.5 K/uL Final   Basophils Relative 03/01/2023 1  % Final   Basophils Absolute 03/01/2023 0.1   0.0 - 0.1 K/uL Final   Immature Granulocytes 03/01/2023 0  % Final   Abs Immature Granulocytes 03/01/2023 0.01  0.00 - 0.07 K/uL Final   Performed at [facility], [address], [place], [place] [address]  Office Visit on 03/01/2023  Component Date Value Ref Range Status   Beta-2 Glyco I IgG 03/01/2023 <9  0 - 20 GPI IgG units Final   Comment: (NOTE) The reference interval reflects a 3SD or 99th percentile interval, which is thought to represent a potentially clinically significant result in accordance with the International Consensus Statement on the classification criteria for definitive antiphospholipid syndrome (APS). J Thromb Haem 2006;4:295-306.  Beta-2-Glycoprotein I IgM 03/01/2023 <9  0 - 32 GPI IgM units Final   Comment: (NOTE) The reference interval reflects a 3SD or 99th percentile interval, which is thought to represent a potentially clinically significant result in accordance with the International Consensus Statement on the classification criteria for definitive antiphospholipid syndrome (APS). J Thromb Haem 2006;4:295-306. Performed At: [facility] [address] [place], [place] [id number] Jolene Schimke MD WU:[id number]    Beta-2-Glycoprotein I IgA 03/01/2023 <9  0 - 25 GPI IgA units Final   Comment: (NOTE) The reference interval reflects a 3SD or 99th percentile interval, which is thought to represent a potentially clinically significant result in accordance with the International Consensus Statement on the classification criteria for definitive antiphospholipid syndrome (APS). J Thromb Haem 2006;4:295-306.    Anticardiolipin IgG 03/01/2023 <9  0 - 14 GPL U/mL Final   Comment: (NOTE)                          Negative:              <15                          Indeterminate:     15 - 20                          Low-Med Positive: >20 - 80                          High Positive:         >80    Anticardiolipin IgM 03/01/2023  <9  0 - 12 MPL U/mL Final   Comment: (NOTE)                          Negative:              <13                          Indeterminate:     13 - 20                          Low-Med Positive: >20 - 80                          High Positive:         >80 Performed At: [facility] [place] [address] [place], [place] [id number] Jolene Schimke MD VH:[id number]    Coagulation Factor VIII 03/01/2023 126  56 - 140 % Final   Comment: (NOTE) Performed At: [facility] [address] [place], [place] [id number] Jolene Schimke MD UV:[id number]    D-Dimer, Quant 03/01/2023 0.53 (H)  0.00 - 0.50 ug/mL-FEU Final   Comment: (NOTE) At the manufacturer cut-off value of 0.5 g/mL FEU, this assay has a negative predictive value of 95-100%.This assay is intended for use in conjunction with a clinical pretest probability (PTP) assessment model to exclude pulmonary embolism (PE) and deep venous thrombosis (DVT) in outpatients suspected of PE or DVT. Results should be correlated with clinical presentation. Performed at [facility], [address] [place], [place] [address]    Sodium 03/01/2023 138  135 - 145 mmol/L Final   Potassium 03/01/2023 3.6  3.5 - 5.1 mmol/L Final   Chloride 03/01/2023 108  98 - 111 mmol/L Final   CO2 03/01/2023 25  22 - 32 mmol/L Final   Glucose, Bld 03/01/2023 87  70 - 99 mg/dL Final   Glucose reference range applies only to samples taken after fasting for at least 8 hours.   BUN 03/01/2023 13  6 - 20 mg/dL Final   Creatinine, Ser 03/01/2023 1.02 (H)  0.44 - 1.00 mg/dL Final   Calcium 16/07/9603 8.3 (L)  8.9 - 10.3 mg/dL Final   Total Protein 54/06/8118 7.3  6.5 - 8.1 g/dL Final   Albumin 14/78/2956 3.9  3.5 - 5.0 g/dL Final   AST 21/30/8657 24  15 - 41 U/L Final   ALT 03/01/2023 12  0 - 44 U/L Final   Alkaline Phosphatase 03/01/2023 46  38 - 126 U/L Final   Total Bilirubin 03/01/2023 0.5  0.3 - 1.2 mg/dL Final   GFR, Estimated 03/01/2023  >60  >60 mL/min Final   Comment: (NOTE) Calculated using the CKD-EPI Creatinine Equation (2021)    Anion gap 03/01/2023 5  5 - 15 Final   Performed at [facility], [address], [place], [place] [address]   Fibrinogen 03/01/2023 341  210 - 475 mg/dL Final   Comment: (NOTE) Fibrinogen results may be underestimated in patients receiving thrombolytic therapy. Performed at [facility], [address] [place], [place] [address]    Recommendations-PTGENE: 03/01/2023 Comment   Final   Comment: (NOTE) Result: c.*97G>A - Not Detected This result is not associated with an increased risk for venous thromboembolism. See Additional Clinical Information and Comments. Additional Clinical Information: Venous thromboembolism is a multifactorial disease influenced by genetic, environmental, and circumstantial risk factors. The c.*97G>A variant in the F2 gene is a genetic risk factor for venous thromboembolism. Heterozygous carriers have a 2- to 4-fold increased risk for venous thromboembolism. Homozygotes for the c.*97G>A variant are rare. The annual risk of VTE in homozygotes has been reported to be 1.1%/year. Individuals who carry both a c.*97G>A variant in the F2 gene and a c.1601G>A (p. Arg534Gln) variant in the F5 gene (commonly referred to as Factor V Leiden) have an approximately 20- fold increased risk for venous thromboembolism. Risks are likely to be even higher in more complex genotype combinations involving the F2 c.*97G>A variant and Factor V Leiden (PMID:                           41324401). Additional risk factors include but are not limited to: deficiency of protein C, protein S, or antithrombin III, age, female sex, personal or family history of deep vein thromboembolism, smoking, surgery, prolonged immobilization, malignant neoplasm, tamoxifen treatment, raloxifene treatment, oral contraceptive use, hormone replacement therapy,  and pregnancy. Management of thrombotic risk and thrombotic events should follow established guidelines and fit the clinical circumstance. This result cannot predict the occurrence or recurrence of a thrombotic event. Comments: Genetic counseling is recommended to discuss the potential clinical implications of positive results, as well as recommendations for testing family members. Genetic Coordinators are available for health care providers to discuss results at [phone]). Test Details: Variant analyzed: c.*97G>A, previously referred to as G20210A Methods/Limitations: DNA analysis of the F2 gene (NM_000                          506.5) was performed by PCR amplification followed by restriction  enzyme analysis. The diagnostic sensitivity is >99%. Results must be combined with clinical information for the most accurate interpretation. Molecular-based testing is highly accurate, but as in any laboratory test, diagnostic errors may occur. False positive or false negative results may occur for reasons that include genetic variants, blood transfusions, bone marrow transplantation, somatic or tissue-specific mosaicism, mislabeled samples, or erroneous representation of family relationships. This test was developed and its performance characteristics determined by [facility]. It has not been cleared or approved by the Food and Drug Administration. References: [facility], Valentina Lucks [facility]; ACMG Professional Practice and Guidelines Committee. Addendum: [employer] of Medical Genetics consensus statement on factor V Leiden mutation testing. Genet Med. 2021 Mar 5. doi: 09.8119/J478                          36-021-01108-x. PMID: 29562130. Cherrie Gauze. Prothrombin Thrombophilia. 2006 Jul 25 [Updated 2021 Feb 4]. In: Bufford Buttner, Ardinger HH, Pagon RA, et al., editors. GeneReviews(R) [Internet]. [address] ([place]): [place], [place]; 8657-8469.  Available from: [email or web address].[email or web address]/ Nita Sickle, Blanca Friend, Alvie Heidelberg CS; ACMG Laboratory Quality Assurance Committee. Venous thromboembolism laboratory testing (factor V Leiden and factor II c.*97G>A), 2018 update: a technical standard of the [employer] of [employer] and [employer] ([facility]). Genet Med. 2018 Dec;20(12):1489-1498. doi: 10.1038/s41436-307-882-7812-z. Epub 2018 Oct 5. PMID: 62952841.    Reviewed by: 03/01/2023 Comment   Final   Comment: (NOTE) Alpha Gula, PhD Director, Molecular Genetics Performed At: [facility] [address] [place], [place] [id number] Maurine Simmering MDPhD OZ:[id number]    Hgb F 03/01/2023 0.0  0.0 - 2.0 % Final   Hgb A 03/01/2023 97.3  96.4 - 98.8 % Final   Hgb A2 03/01/2023 2.7  1.8 - 3.2 % Final   Hgb S 03/01/2023 0.0  0.0 % Final   Interpretation, Hgb Fract 03/01/2023 Comment   Final   Comment: (NOTE) Normal hemoglobin present; no hemoglobin variant or beta thalassemia identified. Note: Alpha thalassemia may not be detected by the Hgb Fractionation Cascade panel. If alpha thalassemia is suspected, [facility] offers Alpha-Thalassemia DNA Analysis [phone]). Performed At: [facility] [address] [place], [place] [id number] Jolene Schimke MD JJ:[id number]   Appointment on 02/15/2023  Component Date Value Ref Range Status   WBC 02/15/2023 6.8  3.4 - 10.8 x10E3/uL Final   RBC 02/15/2023 4.08  3.77 - 5.28 x10E6/uL Final   Hemoglobin 02/15/2023 11.6  11.1 - 15.9 g/dL Final   Hematocrit 16/10/930 36.9  34.0 - 46.6 % Final   MCV 02/15/2023 90  79 - 97 fL Final   MCH 02/15/2023 28.4  26.6 - 33.0 pg Final   MCHC 02/15/2023 31.4 (L)  31.5 - 35.7 g/dL Final   RDW 35/57/3220 12.1  11.7 - 15.4 % Final   Platelets 02/15/2023 265  150 - 450 x10E3/uL Final  Office Visit on 02/12/2023  Component Date Value Ref Range Status   Color, UA 02/12/2023 yellow  yellow Final   Clarity, UA  02/12/2023 clear  clear Final   Glucose, UA 02/12/2023 negative  negative mg/dL Final   Bilirubin, UA 25/42/7062 negative  negative Final   Ketones, POC UA 02/12/2023 trace (5) (A)  negative mg/dL Final   Spec Grav, UA 37/62/8315 1.020  1.010 - 1.025 Final   Blood, UA 02/12/2023 negative  negative Final   pH, UA 02/12/2023 7.0  5.0 - 8.0 Final  POC PROTEIN,UA 02/12/2023 =30 (A)  negative, trace Final   Urobilinogen, UA 02/12/2023 2.0 (A)  0.2 or 1.0 E.U./dL Final   Nitrite, UA 16/07/9603 Negative  Negative Final   Leukocytes, UA 02/12/2023 Trace (A)  Negative Final    RADIOGRAPHIC STUDIES: I have personally reviewed the radiological images as listed and agree with the findings in the report  No results found.  ASSESSMENT/PLAN  52 y.o. female is here because of   VTE.  Medical history notable for arthritis, asthma, chronic kidney disease, hypertension, obesity, nephrolithiasis, tubal ligations  Right peroneal DVT- January 31, 2023: Presented with right leg and calf pain x 3 days.  Was on oral contraceptives at the time.  LE U/S showed acute DVT of right peroneal veins.  She was placed on Eliquis.  Hypercoagulable state evaluation Mar 01 2023- Will obtain the following:  CBC with diff, CMP, PT, PTT, Factor V Leiden Gene, Prothrombin Gene, anti-beta2 glycolipoprotein, anticardiolipan antibody, d-dimer, ANA with reflex, RF.  Can consider PCR for bcr-abl, JAK 2 with reflex, flow for PNH, FVIII level.   Because of anticoagulation with a direct thrombin inhibitor can not obtain Protein C, Protein S, ATIII activities or Lupus Anticoagulant as those are interfered with by these agents.     VTE Risk Factors  Obesity:  Generally considered to be a moderate risk factor for VTE leading to two- to five-fold increased risk of developing VTE, compared to those with normal BMI    The risk increases with increasing BMI: individuals with a BMI ?35 kg/m2 may present a six-fold greater risk than those with  normal BMI.  Risk of VTE associated with obesity was highest in patients aged >50 years The interaction between obesity and another acquired risk factor almost doubles the risk of VTE.   Obesity is associated with inactivity, raised intra-abdominal pressure, a chronic low-grade inflammatory state, impaired fibrinolysis, high levels of fibrinogen, von Willebrand factor and factor VIII, leading to a prothrombotic condition and elevated risk of VTE    (Reference:  Med Pharm Rep. 2020 Apr; 93(2): 162-168.)      Oral contraceptives:  Hormonal contraception has been associated with VTE (DVT, PE and and cerebral VT)   To provide a current comprehensive overview of the risk of objectively confirmed VT with HC in healthy women compared to nonusers., PubMed was searched from inception to April 2018 for eligible studies in the [place] language.  1,962 publications were retrieved. Users of oral contraception with levonorgesterol had increased risk of VT (2.79-4.07); other oral hormonal preparations increased risk by 4.0-48.6. Levonorgestrel intrauterine devices did not increase risk. Etonogestrel/ethinyl estradiol vaginal rings increased the risk of VT by 6.5. Norelgestromin/ethinyl estradiol patches increased risk of VT by 7.9. Etonogestrel subcutaneous implants by 1.4 and depot-medroxyprogesterone by 3.6. The risk of fatal VT was increased in women aged fifteen to twenty-four by 18.8-fold.   Users of HC have a significant increased risk of VT compared to nonusers.    (Reference:  Billy Fischer 2018 Nov; 85(4): 470-477. Published online 2019 Jan 3. doi: 10.1177/0024363918816683.  Systematic Review of Hormonal Contraception and Risk of Venous Thrombosis  Rodman Pickle, MD,1 Maggie Schwalbe, BA,2 Graylon Good, MD, MHA, FAAFP,3 and Ples Specter, MD, FCCP1)     Cancer Staging  No matching staging information was found for the patient.   No problem-specific Assessment & Plan notes found for this  encounter.    Orders Placed This Encounter  Procedures   CBC with Differential (Cancer Center Only)  Standing Status:   Future    Number of Occurrences:   1    Standing Expiration Date:   02/29/2024   APTT    Standing Status:   Future    Number of Occurrences:   1    Standing Expiration Date:   02/29/2024   Protime-INR    Standing Status:   Future    Number of Occurrences:   1    Standing Expiration Date:   02/29/2024   Beta-2-glycoprotein i abs, IgG/M/A   Cardiolipin antibodies, IgM+IgG   Factor 8 assay   D-dimer, quantitative   Comprehensive metabolic panel   Fibrinogen   Factor 5 leiden   Prothrombin gene mutation   Hgb Fractionation Cascade    45  minutes was spent in patient care.  This included time spent preparing to see the patient (e.g., review of tests), obtaining and/or reviewing separately obtained history, counseling and educating the patient/family/caregiver, ordering medications, tests, or procedures; documenting clinical information in the electronic or other health record, independently interpreting results and communicating results to the patient/family/caregiver as well as coordination of care.       All questions were answered. The patient knows to call the clinic with any problems, questions or concerns.  This note was electronically signed.    Loni Muse, MD  03/24/2023 3:13 PM

## 2023-03-01 ENCOUNTER — Inpatient Hospital Stay: Payer: 59

## 2023-03-01 ENCOUNTER — Other Ambulatory Visit: Payer: Self-pay

## 2023-03-01 ENCOUNTER — Inpatient Hospital Stay: Payer: 59 | Attending: Oncology | Admitting: Oncology

## 2023-03-01 ENCOUNTER — Other Ambulatory Visit (HOSPITAL_COMMUNITY): Payer: Self-pay

## 2023-03-01 VITALS — BP 127/82 | HR 69 | Temp 98.1°F | Resp 15 | Wt 186.6 lb

## 2023-03-01 DIAGNOSIS — D6859 Other primary thrombophilia: Secondary | ICD-10-CM

## 2023-03-01 DIAGNOSIS — E669 Obesity, unspecified: Secondary | ICD-10-CM

## 2023-03-01 DIAGNOSIS — Z3041 Encounter for surveillance of contraceptive pills: Secondary | ICD-10-CM

## 2023-03-01 DIAGNOSIS — I82451 Acute embolism and thrombosis of right peroneal vein: Secondary | ICD-10-CM

## 2023-03-01 DIAGNOSIS — E6609 Other obesity due to excess calories: Secondary | ICD-10-CM

## 2023-03-01 DIAGNOSIS — Z87891 Personal history of nicotine dependence: Secondary | ICD-10-CM | POA: Insufficient documentation

## 2023-03-01 DIAGNOSIS — Z7901 Long term (current) use of anticoagulants: Secondary | ICD-10-CM | POA: Diagnosis not present

## 2023-03-01 LAB — CBC WITH DIFFERENTIAL (CANCER CENTER ONLY)
Abs Immature Granulocytes: 0.01 10*3/uL (ref 0.00–0.07)
Basophils Absolute: 0.1 10*3/uL (ref 0.0–0.1)
Basophils Relative: 1 %
Eosinophils Absolute: 0.2 10*3/uL (ref 0.0–0.5)
Eosinophils Relative: 3 %
HCT: 35.4 % — ABNORMAL LOW (ref 36.0–46.0)
Hemoglobin: 11.5 g/dL — ABNORMAL LOW (ref 12.0–15.0)
Immature Granulocytes: 0 %
Lymphocytes Relative: 34 %
Lymphs Abs: 2.2 10*3/uL (ref 0.7–4.0)
MCH: 29.3 pg (ref 26.0–34.0)
MCHC: 32.5 g/dL (ref 30.0–36.0)
MCV: 90.3 fL (ref 80.0–100.0)
Monocytes Absolute: 0.6 10*3/uL (ref 0.1–1.0)
Monocytes Relative: 10 %
Neutro Abs: 3.3 10*3/uL (ref 1.7–7.7)
Neutrophils Relative %: 52 %
Platelet Count: 250 10*3/uL (ref 150–400)
RBC: 3.92 MIL/uL (ref 3.87–5.11)
RDW: 13.8 % (ref 11.5–15.5)
WBC Count: 6.4 10*3/uL (ref 4.0–10.5)
nRBC: 0 % (ref 0.0–0.2)

## 2023-03-01 LAB — COMPREHENSIVE METABOLIC PANEL
ALT: 12 U/L (ref 0–44)
AST: 24 U/L (ref 15–41)
Albumin: 3.9 g/dL (ref 3.5–5.0)
Alkaline Phosphatase: 46 U/L (ref 38–126)
Anion gap: 5 (ref 5–15)
BUN: 13 mg/dL (ref 6–20)
CO2: 25 mmol/L (ref 22–32)
Calcium: 8.3 mg/dL — ABNORMAL LOW (ref 8.9–10.3)
Chloride: 108 mmol/L (ref 98–111)
Creatinine, Ser: 1.02 mg/dL — ABNORMAL HIGH (ref 0.44–1.00)
GFR, Estimated: >60 mL/min (ref >60)
Glucose, Bld: 87 mg/dL (ref 70–99)
Potassium: 3.6 mmol/L (ref 3.5–5.1)
Sodium: 138 mmol/L (ref 135–145)
Total Bilirubin: 0.5 mg/dL (ref 0.3–1.2)
Total Protein: 7.3 g/dL (ref 6.5–8.1)

## 2023-03-01 LAB — FIBRINOGEN: Fibrinogen: 341 mg/dL (ref 210–475)

## 2023-03-01 LAB — PROTIME-INR
INR: 1 (ref 0.8–1.2)
Prothrombin Time: 13.7 seconds (ref 11.4–15.2)

## 2023-03-01 LAB — D-DIMER, QUANTITATIVE: D-Dimer, Quant: 0.53 ug/mL-FEU — ABNORMAL HIGH (ref 0.00–0.50)

## 2023-03-01 LAB — APTT: aPTT: 27 seconds (ref 24–36)

## 2023-03-02 LAB — BETA-2-GLYCOPROTEIN I ABS, IGG/M/A
Beta-2 Glyco I IgG: <9 GPI IgG units (ref 0–20)
Beta-2-Glycoprotein I IgA: <9 GPI IgA units (ref 0–25)
Beta-2-Glycoprotein I IgM: <9 GPI IgM units (ref 0–32)

## 2023-03-02 LAB — CARDIOLIPIN ANTIBODIES, IGM+IGG
Anticardiolipin IgG: <9 GPL U/mL (ref 0–14)
Anticardiolipin IgM: <9 MPL U/mL (ref 0–12)

## 2023-03-04 ENCOUNTER — Telehealth: Payer: Self-pay | Admitting: Oncology

## 2023-03-05 ENCOUNTER — Encounter (HOSPITAL_COMMUNITY): Payer: Self-pay

## 2023-03-05 ENCOUNTER — Ambulatory Visit (HOSPITAL_COMMUNITY)
Admission: RE | Admit: 2023-03-05 | Discharge: 2023-03-05 | Disposition: A | Discharge status: Home / Self Care | Payer: 59 | Source: Ambulatory Visit | Attending: Vascular Surgery | Admitting: Vascular Surgery

## 2023-03-05 VITALS — BP 127/81 | HR 69

## 2023-03-05 DIAGNOSIS — I82451 Acute embolism and thrombosis of right peroneal vein: Secondary | ICD-10-CM | POA: Insufficient documentation

## 2023-03-05 NOTE — Progress Notes (Addendum)
DVT Clinic Note  Name: Barbara Dunn     MRN: [id number]     DOB: [birth year]     Sex: female  PCP: Hoy Register, MD  Today's Visit: Visit Information: Follow Up Visit  Referred to DVT Clinic by: Fayrene Helper, PA-C [facility])   Referred to CPP by: Dr. Edilia Bo Reason for referral:  Chief Complaint  Patient presents with   Med Management - DVT   HISTORY OF PRESENT ILLNESS:  Barbara Dunn is a 52 y.o. female with PMH HTN, migraine without aura, asthma, who presents for follow up medication management after diagnosis of DVT on 02/01/23 after 4 days of R calf pain. Last seen in DVT Clinic 02/01/23 at which time Eliquis was started, and the patient was referred to hematology given unprovoked DVT. She established with hematology 03/01/23 and has follow up planned in June.   Today patient reports her leg is feeling better. She has been very active lately. Her mother recently passed away and she has been in charge of arrangements and her estate. Endorses heavier menstrual bleeding since starting Eliquis. She is on a combined oral contraceptive to help with menstrual bleeding (has been on this >10 years). She says she has follow up scheduled to discuss whether she can continue this with her doctor given DVT diagnosis. Denies abnormal bleeding or bruising. Reports 1 missed dose of Eliquis in the past month. Reports occasionally wearing compression stockings. Not elevating her legs because she isn't often sitting down.   Positive Thrombotic Risk Factors: Estrogen therapy (but has been on current birth control for >10 years) Bleeding Risk Factors: Anticoagulant therapy  Negative Thrombotic Risk Factors: Previous VTE, Recent surgery (within 3 months), Recent trauma (within 3 months), Recent admission to hospital with acute illness (within 3 months), Paralysis, paresis, or recent plaster cast immobilization of lower extremity, Bed rest >72 hours within 3 months, Sedentary journey lasting >8 hours within 4  weeks, Central venous catheterization, Pregnancy, Testosterone therapy, Active cancer, Smoking, Obesity, Older age, Known thrombophilic condition, Recent COVID diagnosis (within 3 months), Recent cesarean section (within 3 months), Within 6 weeks postpartum, Erythropoiesis-stimulating agent, Non-malignant, chronic inflammatory condition  Rx Insurance Coverage: Commercial Rx Affordability: Copay for Eliquis is $15/month. Activated copay card at last visit to reduce cost to $10/month.  Preferred Pharmacy: Refills sent to [facility]   Past Medical History:  Diagnosis Date   Anemia    Arthritis    Asthma    Bronchitis    Chronic kidney disease    Hypertension    Kidney stone    Migraines    Obesity     Past Surgical History:  Procedure Laterality Date   CARDIAC CATHETERIZATION N/A 08/02/2015   Procedure: Left Heart Cath and Coronary Angiography;  Surgeon: Lennette Bihari, MD;  Location: [facility] INVASIVE CV [facility];  Service: Cardiovascular;  Laterality: N/A;   CERVIX LESION DESTRUCTION     TUBAL LIGATION     TUBAL LIGATION      Social History   Socioeconomic History   Marital status: Single    Spouse name: Not on file   Number of children: Not on file   Years of education: Not on file   Highest education level: Not on file  Occupational History   Not on file  Tobacco Use   Smoking status: Former    Types: Cigarettes    Quit date: 12/15/2003    Years since quitting: 19.2   Smokeless tobacco: Never   Tobacco comments:  quit 10 years ago  Vaping Use   Vaping Use: Never used  Substance and Sexual Activity   Alcohol use: Yes    Alcohol/week: 2.0 standard drinks of alcohol    Types: 1 Glasses of wine, 1 Shots of liquor per week    Comment: Once every 3 months    Drug use: No   Sexual activity: Yes    Birth control/protection: Other-see comments    Comment: tubal  Other Topics Concern   Not on file  Social History Narrative   Pt has GED. CNA as well.     Social Determinants of Health   Financial Resource Strain: Not on file  Food Insecurity: Not on file  Transportation Needs: Not on file  Physical Activity: Not on file  Stress: Not on file  Social Connections: Not on file  Intimate Partner Violence: Not on file    Family History  Problem Relation Age of Onset   Hypertension Mother    Schizophrenia Mother    Schizophrenia Sister    Schizophrenia Brother    Schizophrenia Brother    Colon cancer Neg Hx    Esophageal cancer Neg Hx    Colon polyps Neg Hx    Rectal cancer Neg Hx    Stomach cancer Neg Hx     Allergies as of 03/05/2023   (No Known Allergies)    Current Outpatient Medications on File Prior to Encounter  Medication Sig Dispense Refill   apixaban (ELIQUIS) 5 MG TABS tablet Take 1 tablet (5 mg total) by mouth 2 (two) times daily. Start taking after completion of starter pack. 60 tablet 4   albuterol (VENTOLIN HFA) 108 (90 Base) MCG/ACT inhaler Inhale 1 puff into the lungs every 6 (six) hours as needed for wheezing and shortness of breath 6.7 g 1   amLODipine (NORVASC) 2.5 MG tablet Take 1 tablet (2.5 mg total) by mouth daily. 90 tablet 1   APPLE CIDER VINEGAR PO Take 1 capsule by mouth daily.     aspirin 81 MG tablet Take 81 mg by mouth daily.     BIOTIN PO Take 2 tablets by mouth daily.      cetirizine (ZYRTEC) 10 MG tablet Take 10 mg by mouth daily.     [employer] FIBER GUMMY BEARS CHILDREN PO Take by mouth.     docusate sodium (COLACE) 100 MG capsule Take 100 mg by mouth as needed.      ELDERBERRY PO Take by mouth.     fluticasone (FLONASE) 50 MCG/ACT nasal spray PLACE 2 SPRAYS INTO BOTH NOSTRILS DAILY. 16 g 2   fluticasone (FLOVENT HFA) 110 MCG/ACT inhaler INHALE 1 PUFF INTO THE LUNGS 2 (TWO) TIMES DAILY. 12 g 2   lisinopril (ZESTRIL) 20 MG tablet Take 1 tablet (20 mg total) by mouth daily. 90 tablet 1   meclizine (ANTIVERT) 25 MG tablet Take 1 tablet (25 mg total) by mouth 3 (three) times daily as needed for dizziness.  60 tablet 1   Multiple Vitamins-Minerals (MULTIVITAMIN ADULT PO) Take 1 tablet by mouth daily.      neomycin-polymyxin-hydrocortisone (CORTISPORIN) 3.5-10000-1 OTIC suspension Apply 1-2 drops daily after soaking and cover with bandaid 10 mL 0   norgestimate-ethinyl estradiol (VYLIBRA) 0.25-35 MG-MCG tablet Take 1 tablet by mouth daily. 84 tablet 3   oxymetazoline (AFRIN NASAL SPRAY) 0.05 % nasal spray Place 1 spray into both nostrils 2 (two) times daily. 30 mL 0   SUMAtriptan (IMITREX) 50 MG tablet TAKE 2 TABLETS (100 MG TOTAL) BY  MOUTH ONCE FOR 1 DOSE. MAY REPEAT IN 2 HOURS IF HEADACHE PERSISTS, MAX 4 TABLETS/DAY 10 tablet 6   topiramate (TOPAMAX) 50 MG tablet Take 1 tablet (50 mg total) by mouth 2 (two) times daily. 60 tablet 6   triamcinolone cream (KENALOG) 0.1 % APPLY 1 APPLICATION TOPICALLY 2 (TWO) TIMES DAILY. 30 g 2   vitamin C (ASCORBIC ACID) 250 MG tablet Take 250 mg by mouth daily.     [DISCONTINUED] diphenhydrAMINE (BENADRYL) 25 mg capsule Take 25 mg by mouth every 6 (six) hours as needed for itching. Reported on 01/23/2016     No current facility-administered medications on file prior to encounter.   REVIEW OF SYSTEMS:  Review of Systems  Respiratory:  Negative for shortness of breath.   Cardiovascular:  Positive for leg swelling. Negative for chest pain and palpitations.  Musculoskeletal:  Negative for myalgias.  Neurological:  Positive for tingling. Negative for dizziness.   PHYSICAL EXAMINATION:  Vitals:   03/05/23 1039  BP: 127/81  Pulse: 69  SpO2: 100%   Physical Exam Vitals reviewed.  Cardiovascular:     Rate and Rhythm: Normal rate.  Pulmonary:     Effort: Pulmonary effort is normal.  Musculoskeletal:        General: No tenderness.     Right lower leg: Edema (trace to 1+) present.     Left lower leg: Edema (trace) present.  Skin:    Findings: No erythema.  Psychiatric:        Mood and Affect: Mood normal.        Behavior: Behavior normal.        Thought  Content: Thought content normal.   Villalta Score for Post-Thrombotic Syndrome: Pain: Mild Cramps: Mild Heaviness: Absent Paresthesia: Mild Pruritus: Absent Pretibial Edema: Mild Skin Induration: Absent Hyperpigmentation: Absent Redness: Absent Venous Ectasia: Absent Pain on calf compression: Mild Villalta Preliminary Score: 5 Is venous ulcer present?: No If venous ulcer is present and score is <15, then 15 points total are assigned: Absent Villalta Total Score: 5  LABS:  CBC     Component Value Date/Time   WBC 6.4 03/01/2023 1205   WBC 5.7 01/10/2016 0833   RBC 3.92 03/01/2023 1205   HGB 11.5 (L) 03/01/2023 1205   HGB 11.6 02/15/2023 0944   HCT 35.4 (L) 03/01/2023 1205   HCT 36.9 02/15/2023 0944   PLT 250 03/01/2023 1205   PLT 265 02/15/2023 0944   MCV 90.3 03/01/2023 1205   MCV 90 02/15/2023 0944   MCH 29.3 03/01/2023 1205   MCHC 32.5 03/01/2023 1205   RDW 13.8 03/01/2023 1205   RDW 12.1 02/15/2023 0944   LYMPHSABS 2.2 03/01/2023 1205   LYMPHSABS 2.1 11/20/2022 0917   MONOABS 0.6 03/01/2023 1205   EOSABS 0.2 03/01/2023 1205   EOSABS 0.1 11/20/2022 0917   BASOSABS 0.1 03/01/2023 1205   BASOSABS 0.1 11/20/2022 0917    Hepatic Function      Component Value Date/Time   PROT 7.3 03/01/2023 1150   PROT 7.1 11/20/2022 0917   ALBUMIN 3.9 03/01/2023 1150   ALBUMIN 4.3 11/20/2022 0917   AST 24 03/01/2023 1150   ALT 12 03/01/2023 1150   ALKPHOS 46 03/01/2023 1150   BILITOT 0.5 03/01/2023 1150   BILITOT 0.4 11/20/2022 0917    Renal Function   Lab Results  Component Value Date   CREATININE 1.02 (H) 03/01/2023   CREATININE 0.91 11/20/2022   CREATININE 0.97 12/26/2021    Estimated Creatinine Clearance: 69.5 mL/min (A) (by  C-G formula based on SCr of 1.02 mg/dL (H)).   [facility] Vascular Lab Studies:  02/01/23 VAS [place] LOWER EXTREMITY VENOUS (DVT)RIGHT  Summary:  RIGHT:  - Findings consistent with acute deep vein thrombosis involving the right  peroneal veins.  - No  cystic structure found in the popliteal fossa.    LEFT:  - No evidence of common femoral vein obstruction.   ASSESSMENT: Location of DVT: Right distal vein Cause of DVT: unprovoked. Patient is currently taking an estrogen containing oral contraceptive. She has been on this for >10 years so would not expect this to be a strong provoking risk factor for DVT. She has been referred to hematology and follows up with them again in June. The patient is improving symptomatically but still having some RLE swelling. She has not been very adherent with wearing compression stockings or elevation, which I have encouraged again today. Will defer decision on duration of anticoagulation to hematology.   PLAN: -Continue apixaban (Eliquis) 5 mg twice daily. -Expected duration of therapy: per hematology after further work up. Therapy started on 02/01/23. -Patient educated on purpose, proper use and potential adverse effects of apixaban (Eliquis). -Discussed importance of taking medication around the same time every day. -Advised patient of medications to avoid (NSAIDs, aspirin doses >100 mg daily). -Educated that Tylenol (acetaminophen) is the preferred analgesic to lower the risk of bleeding. -Advised patient to alert all providers of anticoagulation therapy prior to starting a new medication or having a procedure. -Emphasized importance of monitoring for signs and symptoms of bleeding (abnormal bruising, prolonged bleeding, nose bleeds, bleeding from gums, discolored urine, black tarry stools). -Educated patient to present to the ED if emergent signs and symptoms of new thrombosis occur. -Counseled patient to wear compression stockings daily, removing at night. Encouraged patient to start elevating legs to help with swelling.   Follow up: 2 months in DVT Clinic.  Pervis Hocking, PharmD, [place], CPP Deep Vein Thrombosis Clinic Clinical Pharmacist Practitioner Office: [phone]  I have evaluated the  patient's chart/imaging and refer this patient to the Clinical Pharmacist Practitioner for medication management. I have reviewed the CPP's documentation and agree with her assessment and plan. I was immediately available during the visit for questions and collaboration.   Waverly Ferrari, MD

## 2023-03-05 NOTE — Patient Instructions (Signed)
-  Continue apixaban (Eliquis) 5 mg twice daily. -Your refills have been sent to [facility]. You may need to call the pharmacy to ask them to fill this when you start to run low on your current supply.  -It is important to take your medication around the same time every day.  -Avoid NSAIDs like ibuprofen (Advil, Motrin) and naproxen (Aleve) as well as aspirin doses over 100 mg daily. -Tylenol (acetaminophen) is the preferred over the counter pain medication to lower the risk of bleeding. -Be sure to alert all of your health care providers that you are taking an anticoagulant prior to starting a new medication or having a procedure. -Monitor for signs and symptoms of bleeding (abnormal bruising, prolonged bleeding, nose bleeds, bleeding from gums, discolored urine, black tarry stools). If you have fallen and hit your head OR if your bleeding is severe or not stopping, seek emergency care.  -Go to the emergency room if emergent signs and symptoms of new clot occur (new or worse swelling and pain in an arm or leg, shortness of breath, chest pain, fast or irregular heartbeats, lightheadedness, dizziness, fainting, coughing up blood) or if you experience a significant color change (pale or blue) in the extremity that has the DVT.  -We recommend you wear compression stockings (20-30 mmHg) as long as you are having swelling or pain. Be sure to purchase the correct size and take them off at night.   Your next visit is on July 16th at 10am.  [facility] DVT Clinic [address] [place], [place], [place] [address] Enter the hospital through Entrance C off [facility] and pull up to the Heart & Vascular Center entrance to the free valet parking.  Check in for your appointment at the Heart & Vascular Center.   If you have any questions or need to reschedule an appointment, please call [phone] [facility].  If you are having an emergency, call 911 or present to the nearest  emergency room.   What is a DVT?  -Deep vein thrombosis (DVT) is a condition in which a blood clot forms in a vein of the deep venous system which can occur in the lower leg, thigh, pelvis, arm, or neck. This condition is serious and can be life-threatening if the clot travels to the arteries of the lungs and causing a blockage (pulmonary embolism, PE). A DVT can also damage veins in the leg, which can lead to long-term venous disease, leg pain, swelling, discoloration, and ulcers or sores (post-thrombotic syndrome).  -Treatment may include taking an anticoagulant medication to prevent more clots from forming and the current clot from growing, wearing compression stockings, and/or surgical procedures to remove or dissolve the clot.

## 2023-03-06 LAB — HGB FRACTIONATION CASCADE
Hgb A2: 2.7 % (ref 1.8–3.2)
Hgb A: 97.3 % (ref 96.4–98.8)
Hgb F: 0 % (ref 0.0–2.0)
Hgb S: 0 %

## 2023-03-06 LAB — FACTOR 8 ASSAY: Coagulation Factor VIII: 126 % (ref 56–140)

## 2023-03-07 ENCOUNTER — Other Ambulatory Visit (HOSPITAL_COMMUNITY): Payer: Self-pay

## 2023-03-14 ENCOUNTER — Ambulatory Visit
Admission: RE | Admit: 2023-03-14 | Discharge: 2023-03-14 | Disposition: A | Discharge status: Home / Self Care | Payer: 59 | Source: Ambulatory Visit | Attending: Urology | Admitting: Urology

## 2023-03-14 DIAGNOSIS — D49512 Neoplasm of unspecified behavior of left kidney: Secondary | ICD-10-CM | POA: Diagnosis not present

## 2023-03-14 MED ORDER — GADOPICLENOL 0.5 MMOL/ML IV SOLN
8.0000 mL | Freq: Once | INTRAVENOUS | Status: AC | PRN
  Administered 2023-03-14: 8 mL via INTRAVENOUS

## 2023-03-19 ENCOUNTER — Other Ambulatory Visit (HOSPITAL_COMMUNITY): Payer: Self-pay

## 2023-03-19 LAB — PROTHROMBIN GENE MUTATION

## 2023-03-24 DIAGNOSIS — D6859 Other primary thrombophilia: Secondary | ICD-10-CM | POA: Insufficient documentation

## 2023-03-26 LAB — FACTOR 5 LEIDEN

## 2023-03-28 ENCOUNTER — Other Ambulatory Visit: Payer: Self-pay

## 2023-03-28 ENCOUNTER — Other Ambulatory Visit: Payer: Self-pay | Admitting: Oncology

## 2023-03-28 DIAGNOSIS — D649 Anemia, unspecified: Secondary | ICD-10-CM

## 2023-03-28 DIAGNOSIS — D6859 Other primary thrombophilia: Secondary | ICD-10-CM

## 2023-03-28 NOTE — Progress Notes (Signed)
[facility] Cancer Follow up Visit:  Patient Care Team: Hoy Register, MD as PCP - General (Family Medicine) Jodelle Red, MD as PCP - Cardiology (Cardiology) Linna Darner, RD as Dietitian (Family Medicine)  CHIEF COMPLAINTS/PURPOSE OF CONSULTATION:  HISTORY OF PRESENTING ILLNESS: Barbara Dunn 52 y.o. female is here because of   VTE Medical history notable for arthritis, asthma, chronic kidney disease, hypertension, obesity, nephrolithiasis, tubal ligations  November 20, 2022: CMP notable for bicarb of 19 creatinine 0.91  January 31, 2023: Presented to [facility] health emergency room with right leg and calf pain x 3 days.  Patient was on oral contraceptives at the time.  She was given an injection of Lovenox On the following day underwent lower extremity ultrasound and was found to have acute DVT involving right peroneal veins.  She was subsidy placed on Eliquis.  February 15, 2023: WBC 6.8 hemoglobin 11.6 MCV 90 platelet count 265   Mar 01 2023:  [facility]  She is G5 P3023 with last pregnancy delivered 27 years ago.  Patient had no prior history of VTE.  She had a tubal ligation following the last pregnancy but has been on OCP's to control her menses, else she had menorrhagia.  Can not identify a precipitating event  Social:  Works as a Lawyer.  Tobacco quit 15 yrs ago.  EtOH was drinking twice weekly while on her diet.  Caretaker for multiple family members  [facility] Mother died 3 bone disease, schizophrenia Father estranged Brother died 25 hit by car  Brother died 52 was homeless, HIV Brother alive 30 HTN  Brother alive 73 HTN schizophrenia Sister alive 62 schizophrenia  WBC 6.4 hemoglobin 11.5 MCV 90 lately count 250; 52 segs 34 lymphs 10 monos 3 eos 1 basophil Hemoglobin electrophoresis normal adult pattern. Anticardiolipin antibody negative.  Antibeta-2 glycoprotein negative. Factor VIII 126 fibrinogen 341 D-dimer 0.53 INR 1.0 PTT 27 Factor V  Leiden negative prothrombin gene mutation negative  CMP notable for creatinine 1.02  March 29 2023:  Scheduled follow up for VTE.  Reviewed results of labs with patient.  Likely cause of DVT was OCP's.  Remains on Eliquis 5 mg bid which has caused menses to be heavier.  Occasional right calf pain.  Remains on OCP's to regulate menses.  To see PCP on July 23rd at which time she will discuss options for regulating her periods.  Duration of OCP use may dictate duration of anticoagulation.    Review of Systems  Constitutional:  Negative for appetite change, chills, fatigue, fever and unexpected weight change.  HENT:   Negative for mouth sores, nosebleeds, sore throat, trouble swallowing and voice change.   Eyes:  Negative for eye problems and icterus.       Vision changes:  None  Respiratory:  Negative for chest tightness, shortness of breath and wheezing.        Rarely has cough productive of sputum with scant hemoptysis  Cardiovascular:  Negative for chest pain, leg swelling and palpitations.       PND:  none Orthopnea:  none  Gastrointestinal:  Negative for abdominal distention, abdominal pain, blood in stool, diarrhea, nausea and vomiting.       Chronic constipation for which she takes stool softener  Endocrine: Negative for hot flashes.       Cold intolerance:  none Heat intolerance:  none  Genitourinary:  Negative for bladder incontinence, difficulty urinating, dysuria, frequency, hematuria and nocturia.   Musculoskeletal:  Negative for arthralgias,  back pain, gait problem, myalgias, neck pain and neck stiffness.  Skin:  Negative for itching, rash and wound.  Neurological:  Negative for dizziness, extremity weakness, gait problem, headaches, light-headedness and speech difficulty.       Occasional numbness of hands  Hematological:  Negative for adenopathy. Does not bruise/bleed easily.  Psychiatric/Behavioral:  Negative for suicidal ideas. The patient is not nervous/anxious.        Does  not sleep well at night    MEDICAL HISTORY: Past Medical History:  Diagnosis Date   Anemia    Arthritis    Asthma    Bronchitis    Chronic kidney disease    Hypertension    Kidney stone    Migraines    Obesity     SURGICAL HISTORY: Past Surgical History:  Procedure Laterality Date   CARDIAC CATHETERIZATION N/A 08/02/2015   Procedure: Left Heart Cath and Coronary Angiography;  Surgeon: Lennette Bihari, MD;  Location: [facility] INVASIVE CV LAB;  Service: Cardiovascular;  Laterality: N/A;   CERVIX LESION DESTRUCTION     TUBAL LIGATION     TUBAL LIGATION      SOCIAL HISTORY: Social History   Socioeconomic History   Marital status: Single    Spouse name: Not on file   Number of children: Not on file   Years of education: Not on file   Highest education level: Not on file  Occupational History   Not on file  Tobacco Use   Smoking status: Former    Types: Cigarettes    Quit date: 12/15/2003    Years since quitting: 19.2   Smokeless tobacco: Never   Tobacco comments:    quit 10 years ago  Vaping Use   Vaping Use: Never used  Substance and Sexual Activity   Alcohol use: Yes    Alcohol/week: 2.0 standard drinks of alcohol    Types: 1 Glasses of wine, 1 Shots of liquor per week    Comment: Once every 3 months    Drug use: No   Sexual activity: Yes    Birth control/protection: Other-see comments    Comment: tubal  Other Topics Concern   Not on file  Social History Narrative   Pt has GED. CNA as well.    Social Determinants of Health   Financial Resource Strain: Not on file  Food Insecurity: Not on file  Transportation Needs: Not on file  Physical Activity: Not on file  Stress: Not on file  Social Connections: Not on file  Intimate Partner Violence: Not on file    FAMILY HISTORY Family History  Problem Relation Age of Onset   Hypertension Mother    Schizophrenia Mother    Schizophrenia Sister    Schizophrenia Brother    Schizophrenia Brother    Colon cancer  Neg Hx    Esophageal cancer Neg Hx    Colon polyps Neg Hx    Rectal cancer Neg Hx    Stomach cancer Neg Hx     ALLERGIES:  has No Known Allergies.  MEDICATIONS:  Current Outpatient Medications  Medication Sig Dispense Refill   albuterol (VENTOLIN HFA) 108 (90 Base) MCG/ACT inhaler Inhale 1 puff into the lungs every 6 (six) hours as needed for wheezing and shortness of breath 6.7 g 1   amLODipine (NORVASC) 2.5 MG tablet Take 1 tablet (2.5 mg total) by mouth daily. 90 tablet 1   apixaban (ELIQUIS) 5 MG TABS tablet Take 1 tablet (5 mg total) by mouth 2 (two) times  daily. Start taking after completion of starter pack. 60 tablet 4   APPLE CIDER VINEGAR PO Take 1 capsule by mouth daily.     aspirin 81 MG tablet Take 81 mg by mouth daily.     BIOTIN PO Take 2 tablets by mouth daily.      cetirizine (ZYRTEC) 10 MG tablet Take 10 mg by mouth daily.     [employer] FIBER GUMMY BEARS CHILDREN PO Take by mouth.     docusate sodium (COLACE) 100 MG capsule Take 100 mg by mouth as needed.      ELDERBERRY PO Take by mouth.     fluticasone (FLONASE) 50 MCG/ACT nasal spray PLACE 2 SPRAYS INTO BOTH NOSTRILS DAILY. 16 g 2   fluticasone (FLOVENT HFA) 110 MCG/ACT inhaler INHALE 1 PUFF INTO THE LUNGS 2 (TWO) TIMES DAILY. 12 g 2   lisinopril (ZESTRIL) 20 MG tablet Take 1 tablet (20 mg total) by mouth daily. 90 tablet 1   meclizine (ANTIVERT) 25 MG tablet Take 1 tablet (25 mg total) by mouth 3 (three) times daily as needed for dizziness. 60 tablet 1   Multiple Vitamins-Minerals (MULTIVITAMIN ADULT PO) Take 1 tablet by mouth daily.      neomycin-polymyxin-hydrocortisone (CORTISPORIN) 3.5-10000-1 OTIC suspension Apply 1-2 drops daily after soaking and cover with bandaid 10 mL 0   norgestimate-ethinyl estradiol (VYLIBRA) 0.25-35 MG-MCG tablet Take 1 tablet by mouth daily. 84 tablet 3   oxymetazoline (AFRIN NASAL SPRAY) 0.05 % nasal spray Place 1 spray into both nostrils 2 (two) times daily. 30 mL 0   SUMAtriptan (IMITREX)  50 MG tablet TAKE 2 TABLETS (100 MG TOTAL) BY MOUTH ONCE FOR 1 DOSE. MAY REPEAT IN 2 HOURS IF HEADACHE PERSISTS, MAX 4 TABLETS/DAY 10 tablet 6   topiramate (TOPAMAX) 50 MG tablet Take 1 tablet (50 mg total) by mouth 2 (two) times daily. 60 tablet 6   triamcinolone cream (KENALOG) 0.1 % APPLY 1 APPLICATION TOPICALLY 2 (TWO) TIMES DAILY. 30 g 2   vitamin C (ASCORBIC ACID) 250 MG tablet Take 250 mg by mouth daily.     No current facility-administered medications for this visit.    PHYSICAL EXAMINATION:  ECOG PERFORMANCE STATUS: 0 - Asymptomatic   There were no vitals filed for this visit.   There were no vitals filed for this visit.    Physical Exam Vitals and nursing note reviewed.  Constitutional:      Appearance: Normal appearance. She is not toxic-appearing or diaphoretic.     Comments: Here alone  HENT:     Head: Normocephalic and atraumatic.     Right Ear: External ear normal.     Left Ear: External ear normal.     Nose: Nose normal. No congestion or rhinorrhea.  Eyes:     General: No scleral icterus.    Extraocular Movements: Extraocular movements intact.     Conjunctiva/sclera: Conjunctivae normal.     Pupils: Pupils are equal, round, and reactive to light.  Cardiovascular:     Rate and Rhythm: Normal rate and regular rhythm.     Heart sounds: No murmur heard.    No friction rub. No gallop.  Pulmonary:     Effort: Pulmonary effort is normal. No respiratory distress.     Breath sounds: Normal breath sounds. No stridor. No wheezing or rhonchi.  Abdominal:     General: Bowel sounds are normal.     Palpations: Abdomen is soft.     Tenderness: There is no abdominal tenderness. There is  no guarding or rebound.  Musculoskeletal:        General: No swelling, tenderness or deformity.     Cervical back: Normal range of motion and neck supple. No rigidity or tenderness.     Right lower leg: No edema.     Left lower leg: No edema.  Lymphadenopathy:     Head:     Right  side of head: No submental, submandibular, tonsillar, preauricular, posterior auricular or occipital adenopathy.     Left side of head: No submental, submandibular, tonsillar, preauricular, posterior auricular or occipital adenopathy.     Cervical: No cervical adenopathy.     Right cervical: No superficial, deep or posterior cervical adenopathy.    Left cervical: No superficial, deep or posterior cervical adenopathy.     Upper Body:     Right upper body: No supraclavicular, axillary, pectoral or epitrochlear adenopathy.     Left upper body: No supraclavicular, axillary, pectoral or epitrochlear adenopathy.  Skin:    General: Skin is warm.     Coloration: Skin is not jaundiced.     Findings: No bruising or erythema.  Neurological:     General: No focal deficit present.     Mental Status: She is alert and oriented to person, place, and time.     Cranial Nerves: No cranial nerve deficit.     Motor: No weakness.  Psychiatric:        Mood and Affect: Mood normal.        Behavior: Behavior normal.        Thought Content: Thought content normal.        Judgment: Judgment normal.     LABORATORY DATA: I have personally reviewed the data as listed:  Appointment on 03/01/2023  Component Date Value Ref Range Status   Prothrombin Time 03/01/2023 13.7  11.4 - 15.2 seconds Final   INR 03/01/2023 1.0  0.8 - 1.2 Final   Comment: (NOTE) INR goal varies based on device and disease states. Performed at [facility], [address] [place], [place] [address]    aPTT 03/01/2023 27  24 - 36 seconds Final   Performed at [facility], [address] [place], [place] [address]   WBC Count 03/01/2023 6.4  4.0 - 10.5 K/uL Final   RBC 03/01/2023 3.92  3.87 - 5.11 MIL/uL Final   Hemoglobin 03/01/2023 11.5 (L)  12.0 - 15.0 g/dL Final   HCT 09/81/1914 35.4 (L)  36.0 - 46.0 % Final   MCV 03/01/2023 90.3  80.0 - 100.0 fL Final   MCH 03/01/2023 29.3  26.0 - 34.0 pg  Final   MCHC 03/01/2023 32.5  30.0 - 36.0 g/dL Final   RDW 78/29/5621 13.8  11.5 - 15.5 % Final   Platelet Count 03/01/2023 250  150 - 400 K/uL Final   nRBC 03/01/2023 0.0  0.0 - 0.2 % Final   Neutrophils Relative % 03/01/2023 52  % Final   Neutro Abs 03/01/2023 3.3  1.7 - 7.7 K/uL Final   Lymphocytes Relative 03/01/2023 34  % Final   Lymphs Abs 03/01/2023 2.2  0.7 - 4.0 K/uL Final   Monocytes Relative 03/01/2023 10  % Final   Monocytes Absolute 03/01/2023 0.6  0.1 - 1.0 K/uL Final   Eosinophils Relative 03/01/2023 3  % Final   Eosinophils Absolute 03/01/2023 0.2  0.0 - 0.5 K/uL Final   Basophils Relative 03/01/2023 1  % Final   Basophils Absolute 03/01/2023 0.1  0.0 -  0.1 K/uL Final   Immature Granulocytes 03/01/2023 0  % Final   Abs Immature Granulocytes 03/01/2023 0.01  0.00 - 0.07 K/uL Final   Performed at [facility], [address] [place], [place] [address]  Office Visit on 03/01/2023  Component Date Value Ref Range Status   Beta-2 Glyco I IgG 03/01/2023 <9  0 - 20 GPI IgG units Final   Comment: (NOTE) The reference interval reflects a 3SD or 99th percentile interval, which is thought to represent a potentially clinically significant result in accordance with the International Consensus Statement on the classification criteria for definitive antiphospholipid syndrome (APS). J Thromb Haem 2006;4:295-306.    Beta-2-Glycoprotein I IgM 03/01/2023 <9  0 - 32 GPI IgM units Final   Comment: (NOTE) The reference interval reflects a 3SD or 99th percentile interval, which is thought to represent a potentially clinically significant result in accordance with the International Consensus Statement on the classification criteria for definitive antiphospholipid syndrome (APS). J Thromb Haem 2006;4:295-306. Performed At: [facility] [address] [place], [place] [id number] Jolene Schimke MD XB:[id number]    Beta-2-Glycoprotein I IgA 03/01/2023 <9  0  - 25 GPI IgA units Final   Comment: (NOTE) The reference interval reflects a 3SD or 99th percentile interval, which is thought to represent a potentially clinically significant result in accordance with the International Consensus Statement on the classification criteria for definitive antiphospholipid syndrome (APS). J Thromb Haem 2006;4:295-306.    Anticardiolipin IgG 03/01/2023 <9  0 - 14 GPL U/mL Final   Comment: (NOTE)                          Negative:              <15                          Indeterminate:     15 - 20                          Low-Med Positive: >20 - 80                          High Positive:         >80    Anticardiolipin IgM 03/01/2023 <9  0 - 12 MPL U/mL Final   Comment: (NOTE)                          Negative:              <13                          Indeterminate:     13 - 20                          Low-Med Positive: >20 - 80                          High Positive:         >80 Performed At: [facility] [address] [place], [place] [id number] Jolene Schimke MD [id number]    Coagulation Factor VIII 03/01/2023 126  56 - 140 % Final   Comment: (NOTE) Performed At: [facility]  [place] [address] [place], [place] [id number] Jolene Schimke MD WU:[id number]    D-Dimer, Quant 03/01/2023 0.53 (H)  0.00 - 0.50 ug/mL-FEU Final   Comment: (NOTE) At the manufacturer cut-off value of 0.5 g/mL FEU, this assay has a negative predictive value of 95-100%.This assay is intended for use in conjunction with a clinical pretest probability (PTP) assessment model to exclude pulmonary embolism (PE) and deep venous thrombosis (DVT) in outpatients suspected of PE or DVT. Results should be correlated with clinical presentation. Performed at [facility], [address], [place], [place] [address]    Sodium 03/01/2023 138  135 - 145 mmol/L Final   Potassium 03/01/2023 3.6  3.5 - 5.1 mmol/L Final   Chloride 03/01/2023 108  98 - 111  mmol/L Final   CO2 03/01/2023 25  22 - 32 mmol/L Final   Glucose, Bld 03/01/2023 87  70 - 99 mg/dL Final   Glucose reference range applies only to samples taken after fasting for at least 8 hours.   BUN 03/01/2023 13  6 - 20 mg/dL Final   Creatinine, Ser 03/01/2023 1.02 (H)  0.44 - 1.00 mg/dL Final   Calcium 30/86/5784 8.3 (L)  8.9 - 10.3 mg/dL Final   Total Protein 69/62/9528 7.3  6.5 - 8.1 g/dL Final   Albumin 41/32/4401 3.9  3.5 - 5.0 g/dL Final   AST 02/72/5366 24  15 - 41 U/L Final   ALT 03/01/2023 12  0 - 44 U/L Final   Alkaline Phosphatase 03/01/2023 46  38 - 126 U/L Final   Total Bilirubin 03/01/2023 0.5  0.3 - 1.2 mg/dL Final   GFR, Estimated 03/01/2023 >60  >60 mL/min Final   Comment: (NOTE) Calculated using the CKD-EPI Creatinine Equation (2021)    Anion gap 03/01/2023 5  5 - 15 Final   Performed at [facility], [address] [place], [place] [address]   Fibrinogen 03/01/2023 341  210 - 475 mg/dL Final   Comment: (NOTE) Fibrinogen results may be underestimated in patients receiving thrombolytic therapy. Performed at [facility], [address], [place], [place] [address]    Recommendations-F5LEID: 03/01/2023 Comment   Final   Comment: (NOTE) Result: c.1601G>A (p.Arg534Gln) - Not Detected This result is not associated with an increased risk for venous thromboembolism. See Additional Clinical Information and Comments. Additional Clinical Information: Venous thromboembolism is a multifactorial disease influenced by genetic, environmental, and circumstantial risk factors. The c.1601G>A (p. Arg534Gln) variant in the F5 gene, commonly referred to as Factor V Leiden, is a genetic risk factor for venous thromboembolism. Heterozygous carriers of this variant have a 6- to 8- fold increased risk for venous thromboembolism. Individuals homozygous for this variant (ie, with a copy of the variant on each chromosome) have an  approximately 80-fold increased risk for venous thromboembolism. Individuals who carry both a c.*97G>A variant in the F2 gene and Factor V Leiden have an approximately 20-fold increased risk for venous thromboembolism. Risks are likely to be even higher in more complex genotype combinations in                          volving the F2 c.*97G>A variant and Factor V Leiden (PMID: 56387564). Additional risk factors include but are not limited to: deficiency of protein C, protein S, or antithrombin III, age, female sex, personal or family history of deep vein thromboembolism, smoking, surgery, prolonged immobilization, malignant neoplasm, tamoxifen treatment, raloxifene treatment, oral contraceptive use,  hormone replacement therapy, and pregnancy. Management of thrombotic risk and thrombotic events should follow established guidelines and fit the clinical circumstance. This result cannot predict the occurrence or recurrence of a thrombotic event. Comment: Genetic counseling is recommended to discuss the potential clinical implications of positive results, as well as recommendations for testing family members. Genetic Coordinators are available for health care providers to discuss results at [phone]). Test Details: Variant Analyzed: c.1601G>A (p. Arg534Gln), referred to as Fact                          or V Leiden Methods/Limitations: DNA analysis of the F5 gene (NM_000130.5) was performed by PCR amplification followed by restriction enzyme analysis. The diagnostic sensitivity is >99%. Results must be combined with clinical information for the most accurate interpretation. Molecular- based testing is highly accurate, but as in any laboratory test, diagnostic errors may occur. False positive or false negative results may occur for reasons that include genetic variants, blood transfusions, bone marrow transplantation, somatic or tissue-specific mosaicism, mislabeled samples, or  erroneous representation of family relationships. This test was developed and its performance characteristics determined by [facility]. It has not been cleared or approved by the Food and Drug Administration. References: Dewitt Hoes [facility], Valentina Lucks [facility]; [facility] Professional Practice and Guidelines Committee. Addendum: [employer] of Medical Genetics consensus statement on fac                          tor V Leiden mutation testing. Genet Med. 2021 Mar 5. doi: 94.8546/E70350-093- 01108-x. PMID: 81829937. Cherrie Gauze. Factor V Leiden Thrombophilia. 1999 May 14 (Updated 2018 Jan 4). In: Bufford Buttner, Ardinger HH, Pagon RA, et al., editors. GeneReviews(R) (Internet). [address] ([place]): [place], [place]; 1696-7893. Available from: [email or web address]-mcgee.org/ Nita Sickle, Blanca Friend, Alvie Heidelberg CS; [facility] Laboratory Quality Assurance Committee. Venous thromboembolism laboratory testing (factor V Leiden and factor II c.*97G>A), 2018 update: a technical standard of the [employer] of [employer] and Genomics ([facility]). Genet Med. 2018 Dec;20(12):1489-1498. doi: 10.1038/[id number]-z. Epub 2018 Oct 5. PMID: 81017510.    Reviewed By: 03/01/2023 Comment   Final   Comment: (NOTE) Technical Component performed at [facility] Professional Component performed by: Alpha Gula, Ph.D., [facility] Director, Molecular Genetics [address] [place] [address] Performed At: [facility] [address] [place], [place] [id number] Maurine Simmering MDPhD IR:[id number]    Recommendations-PTGENE: 03/01/2023 Comment   Final   Comment: (NOTE) Result: c.*97G>A - Not Detected This result is not associated with an increased risk for venous thromboembolism. See Additional Clinical Information and Comments. Additional Clinical Information: Venous thromboembolism is a multifactorial disease influenced by genetic, environmental, and  circumstantial risk factors. The c.*97G>A variant in the F2 gene is a genetic risk factor for venous thromboembolism. Heterozygous carriers have a 2- to 4-fold increased risk for venous thromboembolism. Homozygotes for the c.*97G>A variant are rare. The annual risk of VTE in homozygotes has been reported to be 1.1%/year. Individuals who carry both a c.*97G>A variant in the F2 gene and a c.1601G>A (p. Arg534Gln) variant in the F5 gene (commonly referred to as Factor V Leiden) have an approximately 20- fold increased risk for venous thromboembolism. Risks are likely to be even higher in more complex genotype combinations involving the F2 c.*97G>A variant and Factor V Leiden (PMID:  [id number]). Additional risk factors include but are not limited to: deficiency of protein C, protein S, or antithrombin III, age, female sex, personal or family history of deep vein thromboembolism, smoking, surgery, prolonged immobilization, malignant neoplasm, tamoxifen treatment, raloxifene treatment, oral contraceptive use, hormone replacement therapy, and pregnancy. Management of thrombotic risk and thrombotic events should follow established guidelines and fit the clinical circumstance. This result cannot predict the occurrence or recurrence of a thrombotic event. Comments: Genetic counseling is recommended to discuss the potential clinical implications of positive results, as well as recommendations for testing family members. Genetic Coordinators are available for health care providers to discuss results at [phone]). Test Details: Variant analyzed: c.*97G>A, previously referred to as G20210A Methods/Limitations: DNA analysis of the F2 gene (NM_000                          506.5) was performed by PCR amplification followed by restriction enzyme analysis. The diagnostic sensitivity is >99%. Results must be combined with clinical information for the most accurate  interpretation. Molecular-based testing is highly accurate, but as in any laboratory test, diagnostic errors may occur. False positive or false negative results may occur for reasons that include genetic variants, blood transfusions, bone marrow transplantation, somatic or tissue-specific mosaicism, mislabeled samples, or erroneous representation of family relationships. This test was developed and its performance characteristics determined by [facility]. It has not been cleared or approved by the Food and Drug Administration. References: Dewitt Hoes [facility], Valentina Lucks [facility]; [facility] Professional Practice and Guidelines Committee. Addendum: [employer] consensus statement on factor V Leiden mutation testing. Genet Med. 2021 Mar 5. doi: 21.3086/V784                          [id number]. PMID: [id number]. Cherrie Gauze. Prothrombin Thrombophilia. 2006 Jul 25 [Updated 2021 Feb 4]. In: Bufford Buttner, Ardinger HH, Pagon RA, et al., editors. GeneReviews(R) [Internet]. [address] ([place]): [place], [place]; 4132-4401. Available from: [email or web address] Nita Sickle, Blanca Friend, Alvie Heidelberg CS; [facility] Laboratory Quality Assurance Committee. Venous thromboembolism laboratory testing (factor V Leiden and factor II c.*97G>A), 2018 update: a technical standard of the [employer] of [employer] ([facility]). Genet Med. 2018 Dec;20(12):1489-1498. doi: 10.1038/[id number]. Epub 2018 Oct 5. PMID: [id number].    Reviewed by: 03/01/2023 Comment   Final   Comment: (NOTE) Alpha Gula, PhD Director, Molecular Genetics Performed At: [facility] [address] [place], [place] [id number] Maurine Simmering MDPhD ZD:[id number]    Hgb F 03/01/2023 0.0  0.0 - 2.0 % Final   Hgb A 03/01/2023 97.3  96.4 - 98.8 % Final   Hgb A2 03/01/2023 2.7  1.8 - 3.2 % Final   Hgb S 03/01/2023 0.0  0.0 % Final    Interpretation, Hgb Fract 03/01/2023 Comment   Final   Comment: (NOTE) Normal hemoglobin present; no hemoglobin variant or beta thalassemia identified. Note: Alpha thalassemia may not be detected by the Hgb Fractionation Cascade panel. If alpha thalassemia is suspected, [facility] offers Alpha-Thalassemia DNA Analysis [phone]). Performed At: [facility] [address] [place], [place] [id number] Jolene Schimke MD WF:[id number]     RADIOGRAPHIC STUDIES: I have personally reviewed the radiological images as listed and agree with the findings in the report  No results found.  ASSESSMENT/PLAN  52 y.o. female is here because of   VTE.  Medical  history notable for arthritis, asthma, chronic kidney disease, hypertension, obesity, nephrolithiasis, tubal ligations  Right peroneal DVT- January 31, 2023: Presented with right leg and calf pain x 3 days.  Was on oral contraceptives at the time.  LE U/S showed acute DVT of right peroneal veins.  She was placed on Eliquis.  Hypercoagulable state evaluation Mar 01 2023- PT, PTT, Factor V Leiden Gene, Prothrombin Gene, anti-beta2 glycolipoprotein, anticardiolipan antibody negative.  Fibrinogen normal Can consider ANA with reflex, RF,  PCR for bcr-abl, JAK 2 with reflex, flow for PNH, FVIII level.   Because of anticoagulation with a direct thrombin inhibitor can not obtain Protein C, Protein S, ATIII activities or Lupus Anticoagulant as those are interfered with by these agents.     March 29 2023- D dimer from last visit was a bit elevated.  Major risk factor for VTE appears to have been OCP use  VTE Risk Factors  Obesity:  Generally considered to be a moderate risk factor for VTE leading to two- to five-fold increased risk of developing VTE, compared to those with normal BMI    The risk increases with increasing BMI: individuals with a BMI ?35 kg/m2 may present a six-fold greater risk than those with normal BMI.  Risk of VTE associated with  obesity was highest in patients aged >50 years The interaction between obesity and another acquired risk factor almost doubles the risk of VTE.   Obesity is associated with inactivity, raised intra-abdominal pressure, a chronic low-grade inflammatory state, impaired fibrinolysis, high levels of fibrinogen, von Willebrand factor and factor VIII, leading to a prothrombotic condition and elevated risk of VTE    (Reference:  Med Pharm Rep. 2020 Apr; 93(2): 162-168.)      Oral contraceptives:  Hormonal contraception has been associated with VTE (DVT, PE and and cerebral VT)   To provide a current comprehensive overview of the risk of objectively confirmed VT with HC in healthy women compared to nonusers., PubMed was searched from inception to April 2018 for eligible studies in the [place] language.  1,962 publications were retrieved. Users of oral contraception with levonorgesterol had increased risk of VT (2.79-4.07); other oral hormonal preparations increased risk by 4.0-48.6. Levonorgestrel intrauterine devices did not increase risk. Etonogestrel/ethinyl estradiol vaginal rings increased the risk of VT by 6.5. Norelgestromin/ethinyl estradiol patches increased risk of VT by 7.9. Etonogestrel subcutaneous implants by 1.4 and depot-medroxyprogesterone by 3.6. The risk of fatal VT was increased in women aged fifteen to twenty-four by 18.8-fold.   Users of HC have a significant increased risk of VT compared to nonusers.    (Reference:  Billy Fischer 2018 Nov; 85(4): 470-477. Published online 2019 Jan 3. doi: 10.1177/[id number].  Systematic Review of Hormonal Contraception and Risk of Venous Thrombosis  Rodman Pickle, MD,1 Maggie Schwalbe, BA,2 Graylon Good, MD, MHA, FAAFP,3 and Ples Specter, MD, [facility])     Therapeutics March 29 2023- Typical duration of full dose anticoagulation for uncomplicated below the knee VTE is 3 months.  Given ongoing use of OCP would continue anticoagulation.  Patient to discuss  OCP use with PCP.  Would prefer to avoid extended anticoagulation if possible   Anemia  March 29 2023- Mild and will evaluate     Cancer Staging  No matching staging information was found for the patient.   No problem-specific Assessment & Plan notes found for this encounter.    No orders of the defined types were placed in this encounter.   30  minutes was spent in  patient care.  This included time spent preparing to see the patient (e.g., review of tests), obtaining and/or reviewing separately obtained history, counseling and educating the patient/family/caregiver, ordering medications, tests, or procedures; documenting clinical information in the electronic or other health record, independently interpreting results and communicating results to the patient/family/caregiver as well as coordination of care.       All questions were answered. The patient knows to call the clinic with any problems, questions or concerns.  This note was electronically signed.    Loni Muse, MD  03/28/2023 10:54 AM

## 2023-03-29 ENCOUNTER — Inpatient Hospital Stay (HOSPITAL_BASED_OUTPATIENT_CLINIC_OR_DEPARTMENT_OTHER): Payer: 59 | Admitting: Oncology

## 2023-03-29 ENCOUNTER — Other Ambulatory Visit: Payer: Self-pay

## 2023-03-29 ENCOUNTER — Inpatient Hospital Stay: Payer: 59 | Attending: Oncology

## 2023-03-29 ENCOUNTER — Other Ambulatory Visit (HOSPITAL_COMMUNITY): Payer: Self-pay

## 2023-03-29 VITALS — BP 117/82 | HR 69 | Temp 97.9°F | Resp 16 | Wt 187.1 lb

## 2023-03-29 DIAGNOSIS — R7989 Other specified abnormal findings of blood chemistry: Secondary | ICD-10-CM | POA: Insufficient documentation

## 2023-03-29 DIAGNOSIS — Z3041 Encounter for surveillance of contraceptive pills: Secondary | ICD-10-CM

## 2023-03-29 DIAGNOSIS — Z793 Long term (current) use of hormonal contraceptives: Secondary | ICD-10-CM | POA: Diagnosis not present

## 2023-03-29 DIAGNOSIS — D6859 Other primary thrombophilia: Secondary | ICD-10-CM

## 2023-03-29 DIAGNOSIS — Z6839 Body mass index (BMI) 39.0-39.9, adult: Secondary | ICD-10-CM | POA: Diagnosis not present

## 2023-03-29 DIAGNOSIS — D649 Anemia, unspecified: Secondary | ICD-10-CM

## 2023-03-29 DIAGNOSIS — I82451 Acute embolism and thrombosis of right peroneal vein: Secondary | ICD-10-CM | POA: Insufficient documentation

## 2023-03-29 DIAGNOSIS — Z7901 Long term (current) use of anticoagulants: Secondary | ICD-10-CM | POA: Insufficient documentation

## 2023-03-29 DIAGNOSIS — E6609 Other obesity due to excess calories: Secondary | ICD-10-CM

## 2023-03-29 DIAGNOSIS — E669 Obesity, unspecified: Secondary | ICD-10-CM | POA: Insufficient documentation

## 2023-03-29 DIAGNOSIS — N939 Abnormal uterine and vaginal bleeding, unspecified: Secondary | ICD-10-CM

## 2023-03-29 LAB — FERRITIN: Ferritin: 8 ng/mL — ABNORMAL LOW (ref 11–307)

## 2023-03-29 LAB — RETICULOCYTES
Immature Retic Fract: 7 % (ref 2.3–15.9)
RBC.: 3.92 MIL/uL (ref 3.87–5.11)
Retic Count, Absolute: 31.8 10*3/uL (ref 19.0–186.0)
Retic Ct Pct: 0.8 % (ref 0.4–3.1)

## 2023-03-29 LAB — DIRECT ANTIGLOBULIN TEST (NOT AT ARMC)
DAT, IgG: NEGATIVE
DAT, complement: NEGATIVE

## 2023-03-29 LAB — D-DIMER, QUANTITATIVE: D-Dimer, Quant: 0.44 ug/mL-FEU (ref 0.00–0.50)

## 2023-03-29 LAB — VITAMIN B12: Vitamin B-12: 692 pg/mL (ref 180–914)

## 2023-03-29 LAB — HEPATITIS PANEL, ACUTE
HCV Ab: NONREACTIVE
Hep A IgM: NONREACTIVE
Hep B C IgM: NONREACTIVE
Hepatitis B Surface Ag: NONREACTIVE

## 2023-03-29 LAB — HIV ANTIBODY (ROUTINE TESTING W REFLEX): HIV Screen 4th Generation wRfx: NONREACTIVE

## 2023-03-29 LAB — FOLATE: Folate: 18 ng/mL (ref >5.9)

## 2023-03-31 LAB — C3 AND C4
C3 Complement: 156 mg/dL (ref 82–167)
Complement C4, Body Fluid: 23 mg/dL (ref 12–38)

## 2023-03-31 LAB — HAPTOGLOBIN: Haptoglobin: 107 mg/dL (ref 42–296)

## 2023-04-01 LAB — COLD AGGLUTININ TITER: Cold Agglutinin Titer: NEGATIVE

## 2023-04-01 LAB — ANA COMPREHENSIVE PANEL
Anti JO-1: <0.2 AI (ref 0.0–0.9)
Centromere Ab Screen: <0.2 AI (ref 0.0–0.9)
Chromatin Ab SerPl-aCnc: <0.2 AI (ref 0.0–0.9)
ENA SM Ab Ser-aCnc: <0.2 AI (ref 0.0–0.9)
Ribonucleic Protein: <0.2 AI (ref 0.0–0.9)
SSA (Ro) (ENA) Antibody, IgG: 1.1 AI — ABNORMAL HIGH (ref 0.0–0.9)
SSB (La) (ENA) Antibody, IgG: <0.2 AI (ref 0.0–0.9)
Scleroderma (Scl-70) (ENA) Antibody, IgG: <0.2 AI (ref 0.0–0.9)
ds DNA Ab: <1 IU/mL (ref 0–9)

## 2023-04-01 LAB — GLOMERULAR BASEMENT MEMBRANE ANTIBODIES: GBM Ab: <0.2 units (ref 0.0–0.9)

## 2023-04-02 LAB — ANCA TITERS
Atypical P-ANCA titer: <1:20 {titer}
C-ANCA: <1:20 {titer}
P-ANCA: <1:20 {titer}

## 2023-04-03 LAB — KAPPA/LAMBDA LIGHT CHAINS
Kappa free light chain: 32 mg/L — ABNORMAL HIGH (ref 3.3–19.4)
Kappa, lambda light chain ratio: 3.17 — ABNORMAL HIGH (ref 0.26–1.65)
Lambda free light chains: 10.1 mg/L (ref 5.7–26.3)

## 2023-04-03 LAB — ZINC: Zinc: 65 ug/dL (ref 44–115)

## 2023-04-03 LAB — COPPER, SERUM: Copper: 173 ug/dL — ABNORMAL HIGH (ref 80–158)

## 2023-04-04 LAB — CRYOGLOBULIN

## 2023-04-05 DIAGNOSIS — N281 Cyst of kidney, acquired: Secondary | ICD-10-CM | POA: Diagnosis not present

## 2023-04-05 DIAGNOSIS — N200 Calculus of kidney: Secondary | ICD-10-CM | POA: Diagnosis not present

## 2023-04-05 DIAGNOSIS — D1771 Benign lipomatous neoplasm of kidney: Secondary | ICD-10-CM | POA: Diagnosis not present

## 2023-04-05 LAB — MULTIPLE MYELOMA PANEL, SERUM
Albumin SerPl Elph-Mcnc: 3.2 g/dL (ref 2.9–4.4)
Albumin/Glob SerPl: 1.1 (ref 0.7–1.7)
Alpha 1: 0.3 g/dL (ref 0.0–0.4)
Alpha2 Glob SerPl Elph-Mcnc: 0.7 g/dL (ref 0.4–1.0)
B-Globulin SerPl Elph-Mcnc: 1.1 g/dL (ref 0.7–1.3)
Gamma Glob SerPl Elph-Mcnc: 1.1 g/dL (ref 0.4–1.8)
Globulin, Total: 3.2 g/dL (ref 2.2–3.9)
IgA: 290 mg/dL (ref 87–352)
IgG (Immunoglobin G), Serum: 1431 mg/dL (ref 586–1602)
IgM (Immunoglobulin M), Srm: 26 mg/dL (ref 26–217)
M Protein SerPl Elph-Mcnc: 0.7 g/dL — ABNORMAL HIGH
Total Protein ELP: 6.4 g/dL (ref 6.0–8.5)

## 2023-04-23 ENCOUNTER — Other Ambulatory Visit (HOSPITAL_COMMUNITY): Payer: Self-pay

## 2023-04-29 ENCOUNTER — Other Ambulatory Visit (HOSPITAL_COMMUNITY): Payer: Self-pay

## 2023-05-02 ENCOUNTER — Other Ambulatory Visit (HOSPITAL_COMMUNITY): Payer: Self-pay

## 2023-05-07 ENCOUNTER — Encounter (HOSPITAL_COMMUNITY): Payer: Self-pay | Admitting: Student-PharmD

## 2023-05-07 ENCOUNTER — Ambulatory Visit (HOSPITAL_COMMUNITY)
Admission: RE | Admit: 2023-05-07 | Discharge: 2023-05-07 | Disposition: A | Discharge status: Home / Self Care | Payer: 59 | Source: Ambulatory Visit | Attending: Vascular Surgery | Admitting: Vascular Surgery

## 2023-05-07 VITALS — BP 122/84 | HR 69

## 2023-05-07 DIAGNOSIS — I82451 Acute embolism and thrombosis of right peroneal vein: Secondary | ICD-10-CM | POA: Insufficient documentation

## 2023-05-07 NOTE — Patient Instructions (Signed)
You have been discharged from the DVT Clinic! No further follow up in the DVT Clinic is needed.  -Continue apixaban (Eliquis) 5 mg twice daily. -You have been provided with 6 months of refills, sent to [facility].  -Follow up with Dr. Alvis Lemmings and Dr. Angelene Giovanni.  -Reach out if you need anything from [place]! -It is important to take your medication around the same time every day.  -Avoid NSAIDs like ibuprofen (Advil, Motrin) and naproxen (Aleve) as well as aspirin doses over 100 mg daily. -Tylenol (acetaminophen) is the preferred over the counter pain medication to lower the risk of bleeding. -Be sure to alert all of your health care providers that you are taking an anticoagulant prior to starting a new medication or having a procedure. -Monitor for signs and symptoms of bleeding (abnormal bruising, prolonged bleeding, nose bleeds, bleeding from gums, discolored urine, black tarry stools). If you have fallen and hit your head OR if your bleeding is severe or not stopping, seek emergency care.  -Go to the emergency room if emergent signs and symptoms of new clot occur (new or worse swelling and pain in an arm or leg, shortness of breath, chest pain, fast or irregular heartbeats, lightheadedness, dizziness, fainting, coughing up blood) or if you experience a significant color change (pale or blue) in the extremity that has the DVT.  -If you are having an emergency, call 911 or present to the nearest emergency room.   Please reach out if any questions come up. [phone] [name].

## 2023-05-07 NOTE — Progress Notes (Addendum)
DVT Clinic Note  Name: Barbara Dunn     MRN: [id number]     DOB: 1971/02/24     Sex: female  PCP: Hoy Register, MD  Today's Visit: Visit Information: Discharge Visit  Referred to DVT Clinic by: Fayrene Helper, PA-C [facility])    Referred to CPP by: Dr. Edilia Bo Reason for referral:  Chief Complaint  Patient presents with   Med Management - DVT   HISTORY OF PRESENT ILLNESS: Barbara Dunn is a 52 y.o. female with PMH HTN, migraine without aura, asthma, who presents for follow up medication management after diagnosis of acute right peroneal vein DVT on 02/01/23 after 4 days of R calf pain. Initially seen in DVT Clinic 02/01/23 at which time Eliquis was started, and the patient was referred to hematology given her only risk factor for DVT found at that time was long-term use of OCPs which she takes for heavy menstrual bleeding. She was last seen by hematology 03/29/23 who recommended 3 months of anticoagulation for uncomplicated below the knee DVT but that with ongoing use of OCP anticoagulation would need to be continued. The plan was for the patient to discuss OCPs with her PCP at her visit in July then follow up again with hematology in August.   Today patient reports she feels her leg has returned to her baseline. She's no longer have any pain or swelling. After her last visit with hematology, she discontinued her OCP on 04/06/23 to see how it would impact her menstrual bleeding. She had her period 7/10-14 of this month and noted heavier flow than normal. She plans to still discuss this with her PCP next week to help determine her anticoagulation plan going forward. Denies abnormal bleeding or bruising outside of increased bleeding with menses. Reports occasional missed doses of Eliquis in the evening if she can't remember if she's taken it yet or not for the day. She has been having trouble sleeping for past 1-2 weeks and feels like the days are running together which is making it harder to remember.    Positive Thrombotic Risk Factors: Estrogen therapy, Obesity Bleeding Risk Factors: Anticoagulant therapy  Negative Thrombotic Risk Factors: Previous VTE, Recent surgery (within 3 months), Recent trauma (within 3 months), Recent admission to hospital with acute illness (within 3 months), Paralysis, paresis, or recent plaster cast immobilization of lower extremity, Central venous catheterization, Bed rest >72 hours within 3 months, Sedentary journey lasting >8 hours within 4 weeks, Pregnancy, Within 6 weeks postpartum, Recent cesarean section (within 3 months), Testosterone therapy, Erythropoiesis-stimulating agent, Recent COVID diagnosis (within 3 months), Active cancer, Non-malignant, chronic inflammatory condition, Known thrombophilic condition, Smoking, Older age  Rx Insurance Coverage: Commercial Rx Affordability: Copay for Eliquis is $15/month. Activated copay card at last visit to reduce cost to $10/month.  Preferred Pharmacy: Refills sent to [facility]   Past Medical History:  Diagnosis Date   Anemia    Arthritis    Asthma    Bronchitis    Chronic kidney disease    Hypertension    Kidney stone    Migraines    Obesity     Past Surgical History:  Procedure Laterality Date   CARDIAC CATHETERIZATION N/A 08/02/2015   Procedure: Left Heart Cath and Coronary Angiography;  Surgeon: Lennette Bihari, MD;  Location: [facility] INVASIVE [facility];  Service: Cardiovascular;  Laterality: N/A;   CERVIX LESION DESTRUCTION     TUBAL LIGATION     TUBAL LIGATION  Social History   Socioeconomic History   Marital status: Single    Spouse name: Not on file   Number of children: Not on file   Years of education: Not on file   Highest education level: Not on file  Occupational History   Not on file  Tobacco Use   Smoking status: Former    Current packs/day: 0.00    Types: Cigarettes    Quit date: 12/15/2003    Years since quitting: 19.4   Smokeless tobacco: Never   Tobacco  comments:    quit 10 years ago  Vaping Use   Vaping status: Never Used  Substance and Sexual Activity   Alcohol use: Yes    Alcohol/week: 2.0 standard drinks of alcohol    Types: 1 Glasses of wine, 1 Shots of liquor per week    Comment: Once every 3 months    Drug use: No   Sexual activity: Yes    Birth control/protection: Other-see comments    Comment: tubal  Other Topics Concern   Not on file  Social History Narrative   Pt has GED. CNA as well.    Social Determinants of Health   Financial Resource Strain: Not on file  Food Insecurity: Not on file  Transportation Needs: Not on file  Physical Activity: Not on file  Stress: Not on file  Social Connections: Not on file  Intimate Partner Violence: Not on file    Family History  Problem Relation Age of Onset   Hypertension Mother    Schizophrenia Mother    Schizophrenia Sister    Schizophrenia Brother    Schizophrenia Brother    Colon cancer Neg Hx    Esophageal cancer Neg Hx    Colon polyps Neg Hx    Rectal cancer Neg Hx    Stomach cancer Neg Hx     Allergies as of 05/07/2023   (No Known Allergies)    Current Outpatient Medications on File Prior to Encounter  Medication Sig Dispense Refill   albuterol (VENTOLIN HFA) 108 (90 Base) MCG/ACT inhaler Inhale 1 puff into the lungs every 6 (six) hours as needed for wheezing and shortness of breath 6.7 g 1   amLODipine (NORVASC) 2.5 MG tablet Take 1 tablet (2.5 mg total) by mouth daily. 90 tablet 1   apixaban (ELIQUIS) 5 MG TABS tablet Take 1 tablet (5 mg total) by mouth 2 (two) times daily. Start taking after completion of starter pack. 60 tablet 4   APPLE CIDER VINEGAR PO Take 1 capsule by mouth daily.     aspirin 81 MG tablet Take 81 mg by mouth daily.     BIOTIN PO Take 2 tablets by mouth daily.      cetirizine (ZYRTEC) 10 MG tablet Take 10 mg by mouth daily.     [employer] FIBER GUMMY BEARS CHILDREN PO Take by mouth.     docusate sodium (COLACE) 100 MG capsule Take 100 mg by  mouth as needed.      ELDERBERRY PO Take by mouth.     fluticasone (FLONASE) 50 MCG/ACT nasal spray PLACE 2 SPRAYS INTO BOTH NOSTRILS DAILY. 16 g 2   fluticasone (FLOVENT HFA) 110 MCG/ACT inhaler INHALE 1 PUFF INTO THE LUNGS 2 (TWO) TIMES DAILY. 12 g 2   lisinopril (ZESTRIL) 20 MG tablet Take 1 tablet (20 mg total) by mouth daily. 90 tablet 1   meclizine (ANTIVERT) 25 MG tablet Take 1 tablet (25 mg total) by mouth 3 (three) times daily as needed  for dizziness. 60 tablet 1   Multiple Vitamins-Minerals (MULTIVITAMIN ADULT PO) Take 1 tablet by mouth daily.      oxymetazoline (AFRIN NASAL SPRAY) 0.05 % nasal spray Place 1 spray into both nostrils 2 (two) times daily. 30 mL 0   SUMAtriptan (IMITREX) 50 MG tablet TAKE 2 TABLETS (100 MG TOTAL) BY MOUTH ONCE FOR 1 DOSE. MAY REPEAT IN 2 HOURS IF HEADACHE PERSISTS, MAX 4 TABLETS/DAY 10 tablet 6   topiramate (TOPAMAX) 50 MG tablet Take 1 tablet (50 mg total) by mouth 2 (two) times daily. 60 tablet 6   triamcinolone cream (KENALOG) 0.1 % APPLY 1 APPLICATION TOPICALLY 2 (TWO) TIMES DAILY. 30 g 2   vitamin C (ASCORBIC ACID) 250 MG tablet Take 250 mg by mouth daily.     norgestimate-ethinyl estradiol (VYLIBRA) 0.25-35 MG-MCG tablet Take 1 tablet by mouth daily. (Patient not taking: Reported on 05/07/2023) 84 tablet 3   [DISCONTINUED] diphenhydrAMINE (BENADRYL) 25 mg capsule Take 25 mg by mouth every 6 (six) hours as needed for itching. Reported on 01/23/2016     No current facility-administered medications on file prior to encounter.   REVIEW OF SYSTEMS:  Review of Systems  Respiratory:  Negative for shortness of breath.   Cardiovascular:  Negative for chest pain, palpitations and leg swelling.  Musculoskeletal:  Negative for myalgias.  Neurological:  Negative for dizziness and tingling.   PHYSICAL EXAMINATION:  Vitals:   05/07/23 1017  BP: 122/84  Pulse: 69  SpO2: 100%   Physical Exam Vitals reviewed.  Cardiovascular:     Rate and Rhythm: Normal  rate.  Pulmonary:     Effort: Pulmonary effort is normal.  Musculoskeletal:        General: No swelling or tenderness.  Skin:    Findings: No erythema.  Psychiatric:        Mood and Affect: Mood normal.        Behavior: Behavior normal.        Thought Content: Thought content normal.   Villalta Score for Post-Thrombotic Syndrome: Pain: Absent Cramps: Absent Heaviness: Absent Paresthesia: Absent Pruritus: Absent Pretibial Edema: Absent Skin Induration: Absent Hyperpigmentation: Absent Redness: Absent Venous Ectasia: Absent Pain on calf compression: Absent Villalta Preliminary Score: 0 Is venous ulcer present?: No If venous ulcer is present and score is <15, then 15 points total are assigned: Absent Villalta Total Score: 0  LABS:  CBC     Component Value Date/Time   WBC 6.4 03/01/2023 1205   WBC 5.7 01/10/2016 0833   RBC 3.92 03/29/2023 1059   RBC 3.92 03/01/2023 1205   HGB 11.5 (L) 03/01/2023 1205   HGB 11.6 02/15/2023 0944   HCT 35.4 (L) 03/01/2023 1205   HCT 36.9 02/15/2023 0944   PLT 250 03/01/2023 1205   PLT 265 02/15/2023 0944   MCV 90.3 03/01/2023 1205   MCV 90 02/15/2023 0944   MCH 29.3 03/01/2023 1205   MCHC 32.5 03/01/2023 1205   RDW 13.8 03/01/2023 1205   RDW 12.1 02/15/2023 0944   LYMPHSABS 2.2 03/01/2023 1205   LYMPHSABS 2.1 11/20/2022 0917   MONOABS 0.6 03/01/2023 1205   EOSABS 0.2 03/01/2023 1205   EOSABS 0.1 11/20/2022 0917   BASOSABS 0.1 03/01/2023 1205   BASOSABS 0.1 11/20/2022 0917    Hepatic Function      Component Value Date/Time   PROT 7.3 03/01/2023 1150   PROT 7.1 11/20/2022 0917   ALBUMIN 3.9 03/01/2023 1150   ALBUMIN 4.3 11/20/2022 0917   AST 24 03/01/2023  1150   ALT 12 03/01/2023 1150   ALKPHOS 46 03/01/2023 1150   BILITOT 0.5 03/01/2023 1150   BILITOT 0.4 11/20/2022 1610    Renal Function   Lab Results  Component Value Date   CREATININE 1.02 (H) 03/01/2023   CREATININE 0.91 11/20/2022   CREATININE 0.97 12/26/2021     CrCl cannot be calculated (Patient's most recent lab result is older than the maximum 21 days allowed.).   [facility] Vascular Lab Studies:  02/01/23 VAS [place] LOWER EXTREMITY VENOUS (DVT)RIGHT  Summary:  RIGHT:  - Findings consistent with acute deep vein thrombosis involving the right  peroneal veins.  - No cystic structure found in the popliteal fossa.    LEFT:  - No evidence of common femoral vein obstruction.   ASSESSMENT: Location of DVT: Right distal vein Cause of DVT: provoked by a transient risk factor  Patient has now completed >3 months of anticoagulation with Eliquis for right peroneal vein DVT. RLE pain and swelling have resolved. Duration of anticoagulation will be determined depending on if OCPs need to be continued for heavy menstrual bleeding. She self-discontinued this last month. The patient sees her PCP next week to discuss this then sees hematology next month to make a plan for anticoagulation. The patient would like to be able to stay off OCPs so that she can come off anticoagulation. With her DVT symptoms resolved and no medication access issues at this time, we will be available as needed but will have her continue her follow ups with PCP/hematology as planned. I did encourage her to use an AM/PM pill box so that she can better keep track of what day it is to improve non-adherence caused by her forgetting if she's taken Eliquis for that day or not.   PLAN: -Patient is discharged from the DVT Clinic. -Patient has been given refills to her preferred pharmacy.  -Continue apixaban (Eliquis) 5 mg twice daily. -Expected duration of therapy: Per hematology depending on if OCPs need to be continued or can be stopped. Therapy started on 02/01/23. -Patient educated on purpose, proper use and potential adverse effects of apixaban (Eliquis). -Discussed importance of taking medication around the same time every day. -Advised patient of medications to avoid (NSAIDs, aspirin doses >100 mg  daily). -Educated that Tylenol (acetaminophen) is the preferred analgesic to lower the risk of bleeding. -Advised patient to alert all providers of anticoagulation therapy prior to starting a new medication or having a procedure. -Emphasized importance of monitoring for signs and symptoms of bleeding (abnormal bruising, prolonged bleeding, nose bleeds, bleeding from gums, discolored urine, black tarry stools). -Educated patient to present to the ED if emergent signs and symptoms of new thrombosis occur.  Follow up: with PCP and hematology as scheduled. DVT Clinic available as needed.   Pervis Hocking, PharmD, [place], CPP Deep Vein Thrombosis Clinic Clinical Pharmacist Practitioner Office: [phone]  .mad

## 2023-05-14 ENCOUNTER — Other Ambulatory Visit (HOSPITAL_COMMUNITY): Payer: Self-pay

## 2023-05-14 ENCOUNTER — Encounter: Payer: Self-pay | Admitting: Family Medicine

## 2023-05-14 ENCOUNTER — Ambulatory Visit: Payer: 59 | Attending: Family Medicine | Admitting: Family Medicine

## 2023-05-14 VITALS — BP 108/71 | HR 76 | Temp 98.2°F | Ht 64.0 in | Wt 183.8 lb

## 2023-05-14 DIAGNOSIS — I82451 Acute embolism and thrombosis of right peroneal vein: Secondary | ICD-10-CM

## 2023-05-14 DIAGNOSIS — I10 Essential (primary) hypertension: Secondary | ICD-10-CM | POA: Diagnosis not present

## 2023-05-14 DIAGNOSIS — G4709 Other insomnia: Secondary | ICD-10-CM | POA: Diagnosis not present

## 2023-05-14 DIAGNOSIS — Z634 Disappearance and death of family member: Secondary | ICD-10-CM

## 2023-05-14 DIAGNOSIS — G43009 Migraine without aura, not intractable, without status migrainosus: Secondary | ICD-10-CM | POA: Diagnosis not present

## 2023-05-14 DIAGNOSIS — N939 Abnormal uterine and vaginal bleeding, unspecified: Secondary | ICD-10-CM

## 2023-05-14 MED ORDER — HYDROXYZINE HCL 25 MG PO TABS
25.0000 mg | ORAL_TABLET | Freq: Every evening | ORAL | 1 refills | Status: DC | PRN
  Filled 2023-05-14: qty 30, 30d supply, fill #0

## 2023-05-14 MED ORDER — LISINOPRIL 20 MG PO TABS
20.0000 mg | ORAL_TABLET | Freq: Every day | ORAL | 1 refills | Status: DC
Start: 2023-05-14 — End: 2023-11-18
  Filled 2023-05-14 – 2023-05-17 (×2): qty 30, 30d supply, fill #0
  Filled 2023-06-17: qty 30, 30d supply, fill #1
  Filled 2023-07-17: qty 30, 30d supply, fill #2
  Filled 2023-08-19: qty 30, 30d supply, fill #3
  Filled 2023-09-17 – 2023-09-18 (×2): qty 30, 30d supply, fill #4
  Filled 2023-10-15: qty 30, 30d supply, fill #5

## 2023-05-14 MED ORDER — AMLODIPINE BESYLATE 2.5 MG PO TABS
2.5000 mg | ORAL_TABLET | Freq: Every day | ORAL | 1 refills | Status: DC
Start: 2023-05-14 — End: 2023-11-18
  Filled 2023-05-14 – 2023-05-17 (×2): qty 30, 30d supply, fill #0
  Filled 2023-06-17: qty 30, 30d supply, fill #1
  Filled 2023-07-17: qty 30, 30d supply, fill #2
  Filled 2023-08-19: qty 30, 30d supply, fill #3
  Filled 2023-09-17 – 2023-09-18 (×2): qty 30, 30d supply, fill #4
  Filled 2023-10-15: qty 30, 30d supply, fill #5

## 2023-05-14 NOTE — Patient Instructions (Signed)
Abnormal Uterine Bleeding Abnormal uterine bleeding means bleeding more than normal from your womb (uterus). It can include: Bleeding after sex. Bleeding between monthly (menstrual) periods. Bleeding that is heavier than normal. Monthly periods that last longer than normal. Bleeding after you have stopped having your monthly period (menopause). You should see a doctor for any kind of bleeding that is not normal. Treatment depends on the cause of your bleeding and how much you bleed. Follow these instructions at home: Medicines Take over-the-counter and prescription medicines only as told by your doctor. Ask your doctor about: Taking medicines such as aspirin and ibuprofen. Do not take these medicines unless your doctor tells you to take them. Taking over-the-counter medicines, vitamins, herbs, and supplements. You may be given iron pills. Take them as told by your doctor. Managing constipation If you take iron pills, you may need to take these actions to prevent or treat trouble pooping (constipation): Drink enough fluid to keep your pee (urine) pale yellow. Take over-the-counter or prescription medicines. Eat foods that are high in fiber. These include beans, whole grains, and fresh fruits and vegetables. Limit foods that are high in fat and sugar. These include fried or sweet foods. Activity Change your activity to decrease bleeding if you need to change your sanitary pad more than one time every 2 hours: Lie in bed with your feet raised (elevated). Place a cold pack on your lower belly. Rest as much as you are able until the bleeding stops or slows down. General instructions Do not use tampons, douche, or have sex until your doctor says these things are okay. Change your pads often. Get regular exams. These include: Pelvic exams. Screenings for cancer of the cervix. It is up to you to get the results of any tests that are done. Ask how to get your results when they are  ready. Watch for any changes in your bleeding. For 2 months, write down: When your monthly period starts. When your monthly period ends. When you get any abnormal bleeding from your vagina. What problems you notice. Keep all follow-up visits. Contact a doctor if: The bleeding lasts more than one week. You feel dizzy at times. You feel like you may vomit (nausea). You vomit. You feel light-headed or weak. Your symptoms get worse. Get help right away if: You faint. You have to change pads every hour. You have pain in your belly. You have a fever or chills. You get sweaty or weak. You pass large blood clots from your vagina. These symptoms may be an emergency. Get help right away. Call your local emergency services (911 in the [place] Do not wait to see if the symptoms will go away. Do not drive yourself to the hospital. Summary Abnormal uterine bleeding means bleeding more than normal from your womb (uterus). Any kind of bleeding that is not normal should be checked by a doctor. Treatment depends on the cause of your bleeding and how much you bleed. Get help right away if you faint, you have to change pads every hour, or you pass large blood clots from your vagina. This information is not intended to replace advice given to you by your health care provider. Make sure you discuss any questions you have with your health care provider. Document Revised: 02/07/2021 Document Reviewed: 02/07/2021 Elsevier Patient Education  2024 [employer].

## 2023-05-14 NOTE — Progress Notes (Signed)
Subjective:  Patient ID: Barbara Dunn, female    DOB: [birth year]  Age: 52 y.o. MRN: [id number]  CC: Insomnia   HPI Barbara Dunn is a 52 y.o. 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  She presents with sleep disturbances and concerns about menstrual bleeding. The patient was previously on birth control pills, which were stopped due to concerns about blood clot formation. Since stopping the pills, the patient has experienced sleep disturbances, including periods of insomnia lasting up to two weeks. The patient also has a history of heavy menstrual bleeding, which was previously managed with birth control pills. The patient is concerned about the potential for this bleeding to return now that the birth control pills have been stopped as she has been on it for several years.    The patient has a history of migraines, which are managed with Topamax and Imitrex The patient's hypertension is managed with amlodipine and lisinopril. The patient recently lost her mother, for whom she was the primary caregiver.        Past Medical History:  Diagnosis Date   Anemia    Arthritis    Asthma    Bronchitis    Chronic kidney disease    Hypertension    Kidney stone    Migraines    Obesity     Past Surgical History:  Procedure Laterality Date   CARDIAC CATHETERIZATION N/A 08/02/2015   Procedure: Left Heart Cath and Coronary Angiography;  Surgeon: Lennette Bihari, MD;  Location: [facility];  Service: Cardiovascular;  Laterality: N/A;   CERVIX LESION DESTRUCTION     TUBAL LIGATION     TUBAL LIGATION      Family History  Problem Relation Age of Onset   Hypertension Mother    Schizophrenia Mother    Schizophrenia Sister    Schizophrenia Brother    Schizophrenia Brother    Colon cancer  Neg Hx    Esophageal cancer Neg Hx    Colon polyps Neg Hx    Rectal cancer Neg Hx    Stomach cancer Neg Hx     Social History   Socioeconomic History   Marital status: Single    Spouse name: Not on file   Number of children: Not on file   Years of education: Not on file   Highest education level: Not on file  Occupational History   Not on file  Tobacco Use   Smoking status: Former    Current packs/day: 0.00    Types: Cigarettes    Quit date: 12/15/2003    Years since quitting: 19.4   Smokeless tobacco: Never   Tobacco comments:    quit 10 years ago  Vaping Use   Vaping status: Never Used  Substance and Sexual Activity   Alcohol use: Yes    Alcohol/week: 2.0 standard drinks of alcohol    Types: 1 Glasses of wine, 1 Shots of liquor per week    Comment: Once every 3 months    Drug use: No   Sexual activity: Yes    Birth control/protection: Other-see comments    Comment: tubal  Other Topics Concern   Not on file  Social History Narrative   Pt has GED. CNA as well.    Social Determinants of Health   Financial Resource Strain: Not  on file  Food Insecurity: Not on file  Transportation Needs: Not on file  Physical Activity: Not on file  Stress: Not on file  Social Connections: Not on file    No Known Allergies  Outpatient Medications Prior to Visit  Medication Sig Dispense Refill   albuterol (VENTOLIN HFA) 108 (90 Base) MCG/ACT inhaler Inhale 1 puff into the lungs every 6 (six) hours as needed for wheezing and shortness of breath 6.7 g 1   apixaban (ELIQUIS) 5 MG TABS tablet Take 1 tablet (5 mg total) by mouth 2 (two) times daily. Start taking after completion of starter pack. 60 tablet 4   APPLE CIDER VINEGAR PO Take 1 capsule by mouth daily.     aspirin 81 MG tablet Take 81 mg by mouth daily.     BIOTIN PO Take 2 tablets by mouth daily.      cetirizine (ZYRTEC) 10 MG tablet Take 10 mg by mouth daily.     [employer] FIBER GUMMY BEARS CHILDREN PO Take by mouth.      docusate sodium (COLACE) 100 MG capsule Take 100 mg by mouth as needed.      ELDERBERRY PO Take by mouth.     fluticasone (FLONASE) 50 MCG/ACT nasal spray PLACE 2 SPRAYS INTO BOTH NOSTRILS DAILY. 16 g 2   fluticasone (FLOVENT HFA) 110 MCG/ACT inhaler INHALE 1 PUFF INTO THE LUNGS 2 (TWO) TIMES DAILY. 12 g 2   meclizine (ANTIVERT) 25 MG tablet Take 1 tablet (25 mg total) by mouth 3 (three) times daily as needed for dizziness. 60 tablet 1   Multiple Vitamins-Minerals (MULTIVITAMIN ADULT PO) Take 1 tablet by mouth daily.      oxymetazoline (AFRIN NASAL SPRAY) 0.05 % nasal spray Place 1 spray into both nostrils 2 (two) times daily. 30 mL 0   SUMAtriptan (IMITREX) 50 MG tablet TAKE 2 TABLETS (100 MG TOTAL) BY MOUTH ONCE FOR 1 DOSE. MAY REPEAT IN 2 HOURS IF HEADACHE PERSISTS, MAX 4 TABLETS/DAY 10 tablet 6   topiramate (TOPAMAX) 50 MG tablet Take 1 tablet (50 mg total) by mouth 2 (two) times daily. 60 tablet 6   triamcinolone cream (KENALOG) 0.1 % APPLY 1 APPLICATION TOPICALLY 2 (TWO) TIMES DAILY. 30 g 2   vitamin C (ASCORBIC ACID) 250 MG tablet Take 250 mg by mouth daily.     amLODipine (NORVASC) 2.5 MG tablet Take 1 tablet (2.5 mg total) by mouth daily. 90 tablet 1   lisinopril (ZESTRIL) 20 MG tablet Take 1 tablet (20 mg total) by mouth daily. 90 tablet 1   norgestimate-ethinyl estradiol (VYLIBRA) 0.25-35 MG-MCG tablet Take 1 tablet by mouth daily. (Patient not taking: Reported on 05/07/2023) 84 tablet 3   No facility-administered medications prior to visit.     ROS Review of Systems  Constitutional:  Negative for activity change and appetite change.  HENT:  Negative for sinus pressure and sore throat.   Respiratory:  Negative for chest tightness, shortness of breath and wheezing.   Cardiovascular:  Negative for chest pain and palpitations.  Gastrointestinal:  Negative for abdominal distention, abdominal pain and constipation.  Genitourinary: Negative.   Musculoskeletal: Negative.    Psychiatric/Behavioral:  Negative for behavioral problems and dysphoric mood.     Objective:  BP 108/71   Pulse 76   Temp 98.2 F (36.8 C) (Oral)   Ht 5\' 4"  (1.626 m)   Wt 183 lb 12.8 oz (83.4 kg)   SpO2 100%   BMI 31.55 kg/m  05/14/2023    3:40 PM 05/07/2023   10:17 AM 03/29/2023   11:34 AM  BP/Weight  Systolic BP 108 122 117  Diastolic BP 71 84 82  Wt. (Lbs) 183.8  187.1  BMI 31.55 kg/m2  32.12 kg/m2   Wt Readings from Last 3 Encounters:  05/14/23 183 lb 12.8 oz (83.4 kg)  03/29/23 187 lb 1.6 oz (84.9 kg)  03/01/23 186 lb 9.6 oz (84.6 kg)      Physical Exam Constitutional:      Appearance: She is well-developed.  Cardiovascular:     Rate and Rhythm: Normal rate.     Heart sounds: Normal heart sounds. No murmur heard. Pulmonary:     Effort: Pulmonary effort is normal.     Breath sounds: Normal breath sounds. No wheezing or rales.  Chest:     Chest wall: No tenderness.  Abdominal:     General: Bowel sounds are normal. There is no distension.     Palpations: Abdomen is soft. There is no mass.     Tenderness: There is no abdominal tenderness.  Musculoskeletal:        General: Normal range of motion.     Right lower leg: No edema.     Left lower leg: No edema.  Neurological:     Mental Status: She is alert and oriented to person, place, and time.  Psychiatric:        Mood and Affect: Mood normal.        Latest Ref Rng & Units 03/01/2023   11:50 AM 11/20/2022    9:17 AM 12/26/2021    8:35 AM  CMP  Glucose 70 - 99 mg/dL 87  87  88   BUN 6 - 20 mg/dL 13  10  9    Creatinine 0.44 - 1.00 mg/dL 1.19  1.47  8.29   Sodium 135 - 145 mmol/L 138  140  136   Potassium 3.5 - 5.1 mmol/L 3.6  3.9  4.2   Chloride 98 - 111 mmol/L 108  105  103   CO2 22 - 32 mmol/L 25  19  21    Calcium 8.9 - 10.3 mg/dL 8.3  9.1  8.8   Total Protein 6.5 - 8.1 g/dL 7.3  7.1  6.8   Total Bilirubin 0.3 - 1.2 mg/dL 0.5  0.4  0.3   Alkaline Phos 38 - 126 U/L 46  71  58   AST 15 - 41 U/L  24  26  19    ALT 0 - 44 U/L 12  15  15      Lipid Panel     Component Value Date/Time   CHOL 222 (H) 11/20/2022 0917   TRIG 63 11/20/2022 0917   HDL 123 11/20/2022 0917   CHOLHDL 2.1 11/02/2019 1009   CHOLHDL 1.8 07/27/2016 0917   VLDL 14 07/27/2016 0917   LDLCALC 88 11/20/2022 0917    CBC    Component Value Date/Time   WBC 6.4 03/01/2023 1205   WBC 5.7 01/10/2016 0833   RBC 3.92 03/29/2023 1059   RBC 3.92 03/01/2023 1205   HGB 11.5 (L) 03/01/2023 1205   HGB 11.6 02/15/2023 0944   HCT 35.4 (L) 03/01/2023 1205   HCT 36.9 02/15/2023 0944   PLT 250 03/01/2023 1205   PLT 265 02/15/2023 0944   MCV 90.3 03/01/2023 1205   MCV 90 02/15/2023 0944   MCH 29.3 03/01/2023 1205   MCHC 32.5 03/01/2023 1205   RDW 13.8 03/01/2023 1205   RDW  12.1 02/15/2023 0944   LYMPHSABS 2.2 03/01/2023 1205   LYMPHSABS 2.1 11/20/2022 0917   MONOABS 0.6 03/01/2023 1205   EOSABS 0.2 03/01/2023 1205   EOSABS 0.1 11/20/2022 0917   BASOSABS 0.1 03/01/2023 1205   BASOSABS 0.1 11/20/2022 4098    Lab Results  Component Value Date   HGBA1C 5.3 11/20/2022    Assessment & Plan:      Venous Thromboembolism: Patient self-discontinued oral contraceptive pills (OCPs) due to concern for OCP-induced blood clot after recent right DVT diagnosis. - Currently on Eliquis for anticoagulation which she has been on x3 months . No current leg pain or swelling. -Continue Eliquis until further guidance from oncologist.  Menorrhagia: Heavy menstrual bleeding previously managed with OCPs. Discontinued OCPs.  -Refer to Gynecology for further management given contraindication to use of hormonal pills  Insomnia: Recent onset, possibly related to discontinuation of OCPs or bereavement. -Start Hydroxyzine nightly for short-term management.   Hypertension: Well-controlled on Amlodipine and Lisinopril. -Refill Amlodipine and Lisinopril prescriptions. -Counseled on blood pressure goal of less than 130/80, low-sodium, DASH  diet, medication compliance, 150 minutes of moderate intensity exercise per week. Discussed medication compliance, adverse effects.   Migraine: Managed with Topamax daily and Emtrax as needed. -Continue current regimen.   Weight Management: Patient has lost weight. -Encourage continued weight loss efforts.           Meds ordered this encounter  Medications   hydrOXYzine (ATARAX) 25 MG tablet    Sig: Take 1 tablet (25 mg total) by mouth at bedtime as needed.    Dispense:  30 tablet    Refill:  1   amLODipine (NORVASC) 2.5 MG tablet    Sig: Take 1 tablet (2.5 mg total) by mouth daily.    Dispense:  90 tablet    Refill:  1    Dose decrease   lisinopril (ZESTRIL) 20 MG tablet    Sig: Take 1 tablet (20 mg total) by mouth daily.    Dispense:  90 tablet    Refill:  1    Follow-up: Return in about 6 months (around 11/14/2023) for Chronic medical conditions.       Hoy Register, MD, FAAFP. [facility] [place], [place] [phone]   05/14/2023, 5:15 PM

## 2023-05-14 NOTE — Progress Notes (Signed)
Stopped birth control Not sleeping.

## 2023-05-17 ENCOUNTER — Other Ambulatory Visit (HOSPITAL_COMMUNITY): Payer: Self-pay

## 2023-05-21 ENCOUNTER — Ambulatory Visit: Payer: 59 | Admitting: Family Medicine

## 2023-05-30 ENCOUNTER — Other Ambulatory Visit: Payer: Self-pay | Admitting: Oncology

## 2023-05-30 DIAGNOSIS — D649 Anemia, unspecified: Secondary | ICD-10-CM

## 2023-05-30 NOTE — Progress Notes (Signed)
[facility] Follow up Visit:  Patient Care Team: Hoy Register, MD as PCP - General (Family Medicine) Jodelle Red, MD as PCP - Cardiology (Cardiology) Linna Darner, RD as Dietitian (Family Medicine)  CHIEF COMPLAINTS/PURPOSE OF CONSULTATION:  HISTORY OF PRESENTING ILLNESS: Barbara Dunn 52 y.o. female is here because of   VTE Medical history notable for arthritis, asthma, chronic kidney disease, hypertension, obesity, nephrolithiasis, tubal ligations  November 20, 2022: CMP notable for bicarb of 19 creatinine 0.91  January 31, 2023: Presented to [facility] health emergency room with right leg and calf pain x 3 days.  Patient was on oral contraceptives at the time.  She was given an injection of Lovenox On the following day underwent lower extremity ultrasound and was found to have acute DVT involving right peroneal veins.  She was subsequently placed on Eliquis.  February 15, 2023: WBC 6.8 hemoglobin 11.6 MCV 90 platelet count 265   Mar 01 2023:  [facility]  She is G5 P3023 with last pregnancy delivered 27 years ago.  Patient had no prior history of VTE.  She had a tubal ligation following the last pregnancy but has been on OCP's to control her menses, else she had menorrhagia.  Can not identify a precipitating event  Social:  Works as a Lawyer.  Tobacco quit 15 yrs ago.  EtOH was drinking twice weekly while on her diet.  Caretaker for multiple family members  [facility] Mother died 14 bone disease, schizophrenia Father estranged Brother died 59 hit by car  Brother died 91 was homeless, HIV Brother alive 79 HTN  Brother alive 29 HTN schizophrenia Sister alive 36 schizophrenia  WBC 6.4 hemoglobin 11.5 MCV 90 lately count 250; 52 segs 34 lymphs 10 monos 3 eos 1 basophil Hemoglobin electrophoresis normal adult pattern. Anticardiolipin antibody negative.  Antibeta-2 glycoprotein negative. Factor VIII 126 fibrinogen 341 D-dimer 0.53 INR 1.0 PTT  27 Factor V Leiden negative prothrombin gene mutation negative  CMP notable for creatinine 1.02  March 29 2023:    Reviewed results of labs with patient.  Likely cause of DVT was OCP's.  Remains on Eliquis 5 mg bid which has caused menses to be heavier.  Occasional right calf pain.  Remains on OCP's to regulate menses.  To see PCP on July 23rd at which time she will discuss options for regulating her periods.  Duration of OCP use may dictate duration of anticoagulation.    Reticulocyte count 0.8% Direct Coombs test negative.  Cold agglutinin negative.  Haptoglobin 107 SPEP with IEP demonstrated 0.7 g/dL of IgG kappa monoclonal protein urine free kappa 32 lambda 10.1 with a kappa lambda ratio 3.17 IgG G 1431 IgA 290 IgM 26 ANA panel notable for anti-SSA positive at 1.1 ANCA negative C3 156 C4 23 Hepatitis ABC serologies negative.  HIV negative Ferritin 8 folate 18.0 Copper 173 B12 692 zinc 65   May 07, 2023: Follow-up with vascular surgery.  May 14, 2023: Primary care physician referred patient to gynecology for management of menorrhagia  May 31, 2023:  Scheduled follow up for VTE.  On Eliquis 5 mg bid.  Stopped taking OCP's on June 15th.  No LE edema or pain.   Feels pretty good but not sleeping well which she attributes to grief at loss of her mom and stopping OCP's. Hemoglobin 10.5 MCV 87  D dimer 0.29 Chemistries notable for calcium 8.7      Review of Systems  Constitutional:  Negative for appetite change, chills,  fatigue, fever and unexpected weight change.  HENT:   Negative for mouth sores, nosebleeds, sore throat, trouble swallowing and voice change.   Eyes:  Negative for eye problems and icterus.       Vision changes:  None  Respiratory:  Negative for chest tightness, shortness of breath and wheezing.        Rarely has cough productive of sputum with scant hemoptysis  Cardiovascular:  Negative for chest pain, leg swelling and palpitations.       PND:  none Orthopnea:   none  Gastrointestinal:  Negative for abdominal distention, abdominal pain, blood in stool, diarrhea, nausea and vomiting.       Chronic constipation for which she takes stool softener  Endocrine: Negative for hot flashes.       Cold intolerance:  none Heat intolerance:  none  Genitourinary:  Negative for bladder incontinence, difficulty urinating, dysuria, frequency, hematuria and nocturia.   Musculoskeletal:  Negative for arthralgias, back pain, gait problem, myalgias, neck pain and neck stiffness.  Skin:  Negative for itching, rash and wound.  Neurological:  Negative for dizziness, extremity weakness, gait problem, headaches, light-headedness and speech difficulty.       Occasional numbness of hands  Hematological:  Negative for adenopathy. Does not bruise/bleed easily.  Psychiatric/Behavioral:  Negative for suicidal ideas. The patient is not nervous/anxious.        Does not sleep well at night    MEDICAL HISTORY: Past Medical History:  Diagnosis Date   Anemia    Arthritis    Asthma    Bronchitis    Chronic kidney disease    Hypertension    Kidney stone    Migraines    Obesity     SURGICAL HISTORY: Past Surgical History:  Procedure Laterality Date   CARDIAC CATHETERIZATION N/A 08/02/2015   Procedure: Left Heart Cath and Coronary Angiography;  Surgeon: Lennette Bihari, MD;  Location: [facility] INVASIVE CV LAB;  Service: Cardiovascular;  Laterality: N/A;   CERVIX LESION DESTRUCTION     TUBAL LIGATION     TUBAL LIGATION      SOCIAL HISTORY: Social History   Socioeconomic History   Marital status: Single    Spouse name: Not on file   Number of children: Not on file   Years of education: Not on file   Highest education level: Not on file  Occupational History   Not on file  Tobacco Use   Smoking status: Former    Current packs/day: 0.00    Types: Cigarettes    Quit date: 12/15/2003    Years since quitting: 19.4   Smokeless tobacco: Never   Tobacco comments:    quit 10  years ago  Vaping Use   Vaping status: Never Used  Substance and Sexual Activity   Alcohol use: Yes    Alcohol/week: 2.0 standard drinks of alcohol    Types: 1 Glasses of wine, 1 Shots of liquor per week    Comment: Once every 3 months    Drug use: No   Sexual activity: Yes    Birth control/protection: Other-see comments    Comment: tubal  Other Topics Concern   Not on file  Social History Narrative   Pt has GED. CNA as well.    Social Determinants of Health   Financial Resource Strain: Not on file  Food Insecurity: Not on file  Transportation Needs: Not on file  Physical Activity: Not on file  Stress: Not on file  Social Connections: Not on  file  Intimate Partner Violence: Not on file    FAMILY HISTORY Family History  Problem Relation Age of Onset   Hypertension Mother    Schizophrenia Mother    Schizophrenia Sister    Schizophrenia Brother    Schizophrenia Brother    Colon cancer Neg Hx    Esophageal cancer Neg Hx    Colon polyps Neg Hx    Rectal cancer Neg Hx    Stomach cancer Neg Hx     ALLERGIES:  has No Known Allergies.  MEDICATIONS:  Current Outpatient Medications  Medication Sig Dispense Refill   albuterol (VENTOLIN HFA) 108 (90 Base) MCG/ACT inhaler Inhale 1 puff into the lungs every 6 (six) hours as needed for wheezing and shortness of breath 6.7 g 1   amLODipine (NORVASC) 2.5 MG tablet Take 1 tablet (2.5 mg total) by mouth daily. 90 tablet 1   apixaban (ELIQUIS) 5 MG TABS tablet Take 1 tablet (5 mg total) by mouth 2 (two) times daily. Start taking after completion of starter pack. 60 tablet 4   APPLE CIDER VINEGAR PO Take 1 capsule by mouth daily.     aspirin 81 MG tablet Take 81 mg by mouth daily.     BIOTIN PO Take 2 tablets by mouth daily.      cetirizine (ZYRTEC) 10 MG tablet Take 10 mg by mouth daily.     [employer] FIBER GUMMY BEARS CHILDREN PO Take by mouth.     docusate sodium (COLACE) 100 MG capsule Take 100 mg by mouth as needed.      ELDERBERRY  PO Take by mouth.     fluticasone (FLONASE) 50 MCG/ACT nasal spray PLACE 2 SPRAYS INTO BOTH NOSTRILS DAILY. 16 g 2   fluticasone (FLOVENT HFA) 110 MCG/ACT inhaler INHALE 1 PUFF INTO THE LUNGS 2 (TWO) TIMES DAILY. 12 g 2   hydrOXYzine (ATARAX) 25 MG tablet Take 1 tablet (25 mg total) by mouth at bedtime as needed. 30 tablet 1   lisinopril (ZESTRIL) 20 MG tablet Take 1 tablet (20 mg total) by mouth daily. 90 tablet 1   meclizine (ANTIVERT) 25 MG tablet Take 1 tablet (25 mg total) by mouth 3 (three) times daily as needed for dizziness. 60 tablet 1   Multiple Vitamins-Minerals (MULTIVITAMIN ADULT PO) Take 1 tablet by mouth daily.      oxymetazoline (AFRIN NASAL SPRAY) 0.05 % nasal spray Place 1 spray into both nostrils 2 (two) times daily. 30 mL 0   SUMAtriptan (IMITREX) 50 MG tablet TAKE 2 TABLETS (100 MG TOTAL) BY MOUTH ONCE FOR 1 DOSE. MAY REPEAT IN 2 HOURS IF HEADACHE PERSISTS, MAX 4 TABLETS/DAY 10 tablet 6   topiramate (TOPAMAX) 50 MG tablet Take 1 tablet (50 mg total) by mouth 2 (two) times daily. 60 tablet 6   triamcinolone cream (KENALOG) 0.1 % APPLY 1 APPLICATION TOPICALLY 2 (TWO) TIMES DAILY. 30 g 2   vitamin C (ASCORBIC ACID) 250 MG tablet Take 250 mg by mouth daily.     No current facility-administered medications for this visit.    PHYSICAL EXAMINATION:  ECOG PERFORMANCE STATUS: 0 - Asymptomatic   There were no vitals filed for this visit.   There were no vitals filed for this visit.    Physical Exam Vitals and nursing note reviewed.  Constitutional:      Appearance: Normal appearance. She is not toxic-appearing or diaphoretic.     Comments: Here alone  HENT:     Head: Normocephalic and atraumatic.  Right Ear: External ear normal.     Left Ear: External ear normal.     Nose: Nose normal. No congestion or rhinorrhea.  Eyes:     General: No scleral icterus.    Extraocular Movements: Extraocular movements intact.     Conjunctiva/sclera: Conjunctivae normal.      Pupils: Pupils are equal, round, and reactive to light.  Cardiovascular:     Rate and Rhythm: Normal rate and regular rhythm.     Heart sounds: No murmur heard.    No friction rub. No gallop.  Pulmonary:     Effort: Pulmonary effort is normal. No respiratory distress.     Breath sounds: Normal breath sounds. No stridor. No wheezing or rhonchi.  Abdominal:     General: Bowel sounds are normal.     Palpations: Abdomen is soft.     Tenderness: There is no abdominal tenderness. There is no guarding or rebound.  Musculoskeletal:        General: No swelling, tenderness or deformity.     Cervical back: Normal range of motion and neck supple. No rigidity or tenderness.     Right lower leg: No edema.     Left lower leg: No edema.  Lymphadenopathy:     Head:     Right side of head: No submental, submandibular, tonsillar, preauricular, posterior auricular or occipital adenopathy.     Left side of head: No submental, submandibular, tonsillar, preauricular, posterior auricular or occipital adenopathy.     Cervical: No cervical adenopathy.     Right cervical: No superficial, deep or posterior cervical adenopathy.    Left cervical: No superficial, deep or posterior cervical adenopathy.     Upper Body:     Right upper body: No supraclavicular, axillary, pectoral or epitrochlear adenopathy.     Left upper body: No supraclavicular, axillary, pectoral or epitrochlear adenopathy.  Skin:    General: Skin is warm.     Coloration: Skin is not jaundiced.     Findings: No bruising or erythema.  Neurological:     General: No focal deficit present.     Mental Status: She is alert and oriented to person, place, and time.     Cranial Nerves: No cranial nerve deficit.     Motor: No weakness.  Psychiatric:        Mood and Affect: Mood normal.        Behavior: Behavior normal.        Thought Content: Thought content normal.        Judgment: Judgment normal.    LABORATORY DATA: I have personally reviewed  the data as listed:  No visits with results within 1 Month(s) from this visit.  Latest known visit with results is:  Appointment on 03/29/2023  Component Date Value Ref Range Status   Cryoglobulin 03/29/2023 Comment  None detected Final   Comment: (NOTE) None Detected at 72 hours This test was developed and its performance characteristics determined by [facility]. It has not been cleared or approved by the Food and Drug Administration. Performed At: [facility] [address] [place], [place] [id number] Jolene Schimke MD [id number]    D-Dimer, Quant 03/29/2023 0.44  0.00 - 0.50 ug/mL-FEU Final   Comment: (NOTE) At the manufacturer cut-off value of 0.5 g/mL FEU, this assay has a negative predictive value of 95-100%.This assay is intended for use in conjunction with a clinical pretest probability (PTP) assessment model to exclude pulmonary embolism (PE) and deep venous thrombosis (DVT) in outpatients suspected of  PE or DVT. Results should be correlated with clinical presentation. Performed at [facility], [address], [place], [place] [address]    GBM Ab 03/29/2023 <0.2  0.0 - 0.9 units Final   Comment: (NOTE) Performed At: [facility] [address] [place], [place] [id number] Jolene Schimke MD [id number]    C3 Complement 03/29/2023 156  82 - 167 mg/dL Final   Complement C4, Body Fluid 03/29/2023 23  12 - 38 mg/dL Final   Comment: (NOTE) Performed At: [facility] [address] [place], [place] [id number] Jolene Schimke MD UY:[id number]    Hepatitis B Surface Ag 03/29/2023 NON REACTIVE  NON REACTIVE Final   HCV Ab 03/29/2023 NON REACTIVE  NON REACTIVE Final   Comment: (NOTE) Nonreactive HCV antibody screen is consistent with no HCV infections,  unless recent infection is suspected or other evidence exists to indicate HCV infection.     Hep A IgM 03/29/2023 NON REACTIVE  NON REACTIVE Final   Hep B C IgM 03/29/2023  NON REACTIVE  NON REACTIVE Final   Performed at [facility], [address], [place], [place] [address]   HIV Screen 4th Generation wRfx 03/29/2023 Non Reactive  Non Reactive Final   Performed at [facility], [address] [place], [place] [address]   C-ANCA 03/29/2023 <1:20  Neg:<1:20 titer Final   P-ANCA 03/29/2023 <1:20  Neg:<1:20 titer Final   Comment: (NOTE) The presence of positive fluorescence exhibiting P-ANCA or C-ANCA patterns alone is not specific for the diagnosis of Wegener's Granulomatosis (WG) or microscopic polyangiitis. Decisions about treatment should not be based solely on ANCA IFA results.  The International ANCA Group Consensus recommends follow up testing of positive sera with both PR-3 and MPO-ANCA enzyme immunoassays. As many as 5% serum samples are positive only by EIA. Ref. AM J Clin Pathol 1999;111:507-513.    Atypical P-ANCA titer 03/29/2023 <1:20  Neg:<1:20 titer Final   Comment: (NOTE) The atypical pANCA pattern has been observed in a significant percentage of patients with ulcerative colitis, primary sclerosing cholangitis and autoimmune hepatitis. Performed At: [facility] [address] [place], [place] [id number] Jolene Schimke MD AY:[id number]    ds DNA Ab 03/29/2023 <1  0 - 9 IU/mL Final   Comment: (NOTE)                                   Negative      <5                                   Equivocal  5 - 9                                   Positive      >9    Ribonucleic Protein 03/29/2023 <0.2  0.0 - 0.9 AI Final   ENA SM Ab Ser-aCnc 03/29/2023 <0.2  0.0 - 0.9 AI Final   Scleroderma (Scl-70) (ENA) Antibod* 03/29/2023 <0.2  0.0 - 0.9 AI Final   SSA (Ro) (ENA) Antibody, IgG 03/29/2023 1.1 (H)  0.0 - 0.9 AI Final   SSB (La) (ENA) Antibody, IgG 03/29/2023 <0.2  0.0 - 0.9 AI Final   Chromatin Ab SerPl-aCnc 03/29/2023 <0.2  0.0 - 0.9 AI Final  Anti JO-1 03/29/2023 <0.2  0.0 - 0.9 AI Final   Centromere Ab Screen 03/29/2023  <0.2  0.0 - 0.9 AI Final   See below: 03/29/2023 Comment   Final   Comment: (NOTE) Autoantibody                       Disease Association ------------------------------------------------------------                        Condition                  Frequency ---------------------   ------------------------   --------- Antinuclear Antibody,    SLE, mixed connective Direct (ANA-D)           tissue diseases ---------------------   ------------------------   --------- dsDNA                    SLE                        40 - 60% ---------------------   ------------------------   --------- Chromatin                Drug induced SLE                90%                         SLE                        48 - 97% ---------------------   ------------------------   --------- SSA (Ro)                 SLE                        25 - 35%                         Sjogren's Syndrome         40 - 70%                         Neonatal Lupus                 100% ---------------------   ------------------------   --------- SSB (La)                 SLE                                                       10%                         Sjogren's Syndrome              30% ---------------------   -----------------------    --------- Sm (anti-Smith)          SLE                        15 - 30% ---------------------   -----------------------    --------- RNP  Mixed Connective Tissue                         Disease                         95% (U1 nRNP,                SLE                        30 - 50% anti-ribonucleoprotein)  Polymyositis and/or                         Dermatomyositis                 20% ---------------------   ------------------------   --------- Scl-70 (antiDNA          Scleroderma (diffuse)      20 - 35% topoisomerase)           Crest                           13% ---------------------   ------------------------   --------- Jo-1                     Polymyositis and/or                          Dermatomyositis            20 - 40% ---------------------   ------------------------   --------- Centromere B             Scleroderma -                           Crest                         variant                         80% Performed At: [facility] [address] [place], [place] [id number] Jolene Schimke MD HQ:[id number]    Cold Agglutinin Titer 03/29/2023 Negative  Neg <1:32 Final   Comment: (NOTE) Performed At: [facility] [address] [place], [place] [id number] Jolene Schimke MD VO:[id number]    Copper 03/29/2023 173 (H)  80 - 158 ug/dL Final   Comment: (NOTE) This test was developed and its performance characteristics determined by [facility]. It has not been cleared or approved by the Food and Drug Administration.                                Detection Limit = 5 Performed At: [facility] [address] [place], [place] [id number] Jolene Schimke MD FI:[id number]    IgG (Immunoglobin G), Serum 03/29/2023 1,431  586 - 1,602 mg/dL Final   IgA 16/60/6301 290  87 - 352 mg/dL Final   IgM (Immunoglobulin M), Srm 03/29/2023 26  26 - 217 mg/dL Final   Result confirmed on concentration.   Total Protein ELP 03/29/2023 6.4  6.0 - 8.5 g/dL Corrected   Albumin SerPl Elph-Mcnc 03/29/2023 3.2  2.9 - 4.4 g/dL Corrected   Alpha 1 60/07/9322 0.3  0.0 - 0.4 g/dL  Corrected   Alpha2 Glob SerPl Elph-Mcnc 03/29/2023 0.7  0.4 - 1.0 g/dL Corrected   B-Globulin SerPl Elph-Mcnc 03/29/2023 1.1  0.7 - 1.3 g/dL Corrected   Gamma Glob SerPl Elph-Mcnc 03/29/2023 1.1  0.4 - 1.8 g/dL Corrected   M Protein SerPl Elph-Mcnc 03/29/2023 0.7 (H)  Not Observed g/dL Corrected   Globulin, Total 03/29/2023 3.2  2.2 - 3.9 g/dL Corrected   Albumin/Glob SerPl 03/29/2023 1.1  0.7 - 1.7 Corrected   IFE 1 03/29/2023 Comment (A)   Corrected   Comment: (NOTE) Immunofixation shows IgG monoclonal protein with kappa light chain specificity. PLEASE NOTE: Samples from  patients receiving DARZALEX(R) (daratumumab) or SARCLISA(R)(isatuximab-irfc) treatment can appear as an "IgG kappa" and mask a complete response (CR). If this patient is receiving these therapies, this IFE assay interference can be removed by ordering test number 123218-"Immunofixation, Daratumumab-Specific, Serum" or 123062-"Immunofixation, Isatuximab-Specific, Serum" and submitting a new sample for testing or by calling the lab to add this test to the current sample.    Please Note 03/29/2023 Comment   Corrected   Comment: (NOTE) Protein electrophoresis scan will follow via computer, mail, or courier delivery. Performed At: [facility] [address] [place], [place] [id number] Jolene Schimke MD WU:[id number]    Kappa free light chain 03/29/2023 32.0 (H)  3.3 - 19.4 mg/L Final   Lambda free light chains 03/29/2023 10.1  5.7 - 26.3 mg/L Final   Kappa, lambda light chain ratio 03/29/2023 3.17 (H)  0.26 - 1.65 Final   Comment: (NOTE) Performed At: [facility] [address] [place], [place] [id number] Jolene Schimke MD VH:[id number]    Haptoglobin 03/29/2023 107  42 - 296 mg/dL Final   Comment: (NOTE) Performed At: [facility] [address] [place], [place] [id number] Jolene Schimke MD UV:[id number]    DAT, complement 03/29/2023 NEG   Final   DAT, IgG 03/29/2023    Final                   Value:NEG Performed at [facility], [address], [place], [place] [address]    Ferritin 03/29/2023 8 (L)  11 - 307 ng/mL Final   Performed at [facility], [address], [place], [place] [address]   Folate 03/29/2023 18.0  >5.9 ng/mL Final   Performed at [facility], [address] [place], [place] [address]   Retic Ct Pct 03/29/2023 0.8  0.4 - 3.1 % Final   RBC. 03/29/2023 3.92  3.87 - 5.11 MIL/uL Final   Retic Count, Absolute 03/29/2023 31.8  19.0 - 186.0 K/uL Final   Immature Retic Fract  03/29/2023 7.0  2.3 - 15.9 % Final   Performed at [facility], [address], [place], [place] [address]   Zinc 03/29/2023 65  44 - 115 ug/dL Final   Comment: (NOTE) This test was developed and its performance characteristics determined by [facility]. It has not been cleared or approved by the Food and Drug Administration.                                Detection Limit = 5 Performed At: [facility] [address] [place], [place] [id number] Jolene Schimke MD ZS:[id number]    Vitamin B-12 03/29/2023 692  180 - 914 pg/mL Final   Comment: (NOTE) This assay is not validated for testing neonatal or myeloproliferative syndrome specimens for Vitamin B12 levels. Performed at  [facility], [address] [place], [place] [address]     RADIOGRAPHIC STUDIES: I have personally reviewed the radiological images as listed and agree with the findings in the report  No results found.  ASSESSMENT/PLAN  52 y.o. female is here because of   VTE.  Medical history notable for arthritis, asthma, chronic kidney disease, hypertension, obesity, nephrolithiasis, tubal ligations  Right peroneal DVT- January 31, 2023: Presented with right leg and calf pain x 3 days.  Was on oral contraceptives at the time.  LE U/S showed acute DVT of right peroneal veins.  She was placed on Eliquis  May 31 2023:  Off OCP's.  On Eliquis 5 mg po bid.  D dimer 0.29.  Has completed an adequate course of anticoagulation.  May discontinue Eliquis  Hypercoagulable state evaluation Mar 01 2023- PT, PTT, Factor V Leiden Gene, Prothrombin Gene, anti-beta2 glycolipoprotein, anticardiolipan antibody negative.  Fibrinogen normal Can consider ANA with reflex, RF,  PCR for bcr-abl, JAK 2 with reflex, flow for PNH, FVIII level.   Because of anticoagulation with a direct thrombin inhibitor can not obtain Protein C, Protein S, ATIII activities or Lupus Anticoagulant as those are interfered  with by these agents.     March 29 2023- D dimer from last visit was a bit elevated.  Major risk factor for VTE appears to have been OCP use  Rheumatologic evaluation notable for SSA 1.1 which should be repeated in the future   VTE Risk Factors  Obesity:  Generally considered to be a moderate risk factor for VTE leading to two- to five-fold increased risk of developing VTE, compared to those with normal BMI    The risk increases with increasing BMI: individuals with a BMI ?35 kg/m2 may present a six-fold greater risk than those with normal BMI.  Risk of VTE associated with obesity was highest in patients aged >50 years The interaction between obesity and another acquired risk factor almost doubles the risk of VTE.   Obesity is associated with inactivity, raised intra-abdominal pressure, a chronic low-grade inflammatory state, impaired fibrinolysis, high levels of fibrinogen, von Willebrand factor and factor VIII, leading to a prothrombotic condition and elevated risk of VTE    (Reference:  Med Pharm Rep. 2020 Apr; 93(2): 162-168.)      Oral contraceptives:  Hormonal contraception has been associated with VTE (DVT, PE and and cerebral VT)   To provide a current comprehensive overview of the risk of objectively confirmed VT with HC in healthy women compared to nonusers., PubMed was searched from inception to April 2018 for eligible studies in the [place] language.  1,962 publications were retrieved. Users of oral contraception with levonorgesterol had increased risk of VT (2.79-4.07); other oral hormonal preparations increased risk by 4.0-48.6. Levonorgestrel intrauterine devices did not increase risk. Etonogestrel/ethinyl estradiol vaginal rings increased the risk of VT by 6.5. Norelgestromin/ethinyl estradiol patches increased risk of VT by 7.9. Etonogestrel subcutaneous implants by 1.4 and depot-medroxyprogesterone by 3.6. The risk of fatal VT was increased in women aged fifteen to twenty-four by  18.8-fold.   Users of HC have a significant increased risk of VT compared to nonusers.    (Reference:  Billy Fischer 2018 Nov; 85(4): 470-477. Published online 2019 Jan 3. doi: 10.1177/0024363918816683.  Systematic Review of Hormonal Contraception and Risk of Venous Thrombosis  Rodman Pickle, MD,1 Maggie Schwalbe, BA,2 Graylon Good, MD, MHA, FAAFP,3 and Ples Specter, MD, [facility])     Therapeutics March 29 2023- Typical duration of full  dose anticoagulation for uncomplicated below the knee VTE is 3 months.  Given ongoing use of OCP would continue anticoagulation.  Patient to discuss OCP use with PCP.  Would prefer to avoid extended anticoagulation if possible  May 31 2023-  On Eliquis 5 mg bid. Stopped taking OCP's on June 15th.  D dimer 0.29 .  To complete anticoagulation this month.     Anemia  March 29 2023- Mild and secondary to iron deficiency  likely due to menorrhagia exacerbated by oral anticoagulation.  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  Patient should be followed at least yearly because risk of transformation to multiple myeloma, amyloid, etc.      Cancer Staging  No matching staging information was found for the patient.    No problem-specific Assessment & Plan notes found for this encounter.    No orders of the defined types were placed in this encounter.   30  minutes was spent in patient care.  This included time spent preparing to see the patient (e.g., review of tests), obtaining and/or reviewing separately obtained history, counseling and educating the patient/family/caregiver, ordering medications, tests, or procedures;  documenting clinical information in the electronic or other health record, independently interpreting results and communicating results to the patient/family/caregiver as well as coordination of care.       All questions were answered. The patient knows to call the clinic with any problems, questions or concerns.  This note was electronically signed.    Loni Muse, MD  05/30/2023 11:48 AM

## 2023-05-31 ENCOUNTER — Inpatient Hospital Stay (HOSPITAL_BASED_OUTPATIENT_CLINIC_OR_DEPARTMENT_OTHER): Payer: 59 | Admitting: Oncology

## 2023-05-31 ENCOUNTER — Inpatient Hospital Stay: Payer: 59 | Attending: Oncology

## 2023-05-31 ENCOUNTER — Other Ambulatory Visit: Payer: Self-pay

## 2023-05-31 VITALS — BP 139/81 | HR 54 | Temp 98.3°F | Resp 16 | Ht 64.0 in | Wt 182.3 lb

## 2023-05-31 DIAGNOSIS — I82451 Acute embolism and thrombosis of right peroneal vein: Secondary | ICD-10-CM | POA: Diagnosis not present

## 2023-05-31 DIAGNOSIS — Z7901 Long term (current) use of anticoagulants: Secondary | ICD-10-CM

## 2023-05-31 DIAGNOSIS — D472 Monoclonal gammopathy: Secondary | ICD-10-CM | POA: Diagnosis not present

## 2023-05-31 DIAGNOSIS — E669 Obesity, unspecified: Secondary | ICD-10-CM | POA: Insufficient documentation

## 2023-05-31 DIAGNOSIS — D649 Anemia, unspecified: Secondary | ICD-10-CM | POA: Diagnosis not present

## 2023-05-31 DIAGNOSIS — Z87891 Personal history of nicotine dependence: Secondary | ICD-10-CM | POA: Diagnosis not present

## 2023-05-31 DIAGNOSIS — D6859 Other primary thrombophilia: Secondary | ICD-10-CM | POA: Diagnosis not present

## 2023-05-31 DIAGNOSIS — Z3041 Encounter for surveillance of contraceptive pills: Secondary | ICD-10-CM

## 2023-05-31 DIAGNOSIS — N939 Abnormal uterine and vaginal bleeding, unspecified: Secondary | ICD-10-CM | POA: Diagnosis not present

## 2023-05-31 DIAGNOSIS — R7989 Other specified abnormal findings of blood chemistry: Secondary | ICD-10-CM | POA: Diagnosis not present

## 2023-05-31 LAB — CBC WITH DIFFERENTIAL (CANCER CENTER ONLY)
Abs Immature Granulocytes: 0.01 10*3/uL (ref 0.00–0.07)
Basophils Absolute: 0.1 10*3/uL (ref 0.0–0.1)
Basophils Relative: 1 %
Eosinophils Absolute: 0.1 10*3/uL (ref 0.0–0.5)
Eosinophils Relative: 2 %
HCT: 32.7 % — ABNORMAL LOW (ref 36.0–46.0)
Hemoglobin: 10.5 g/dL — ABNORMAL LOW (ref 12.0–15.0)
Immature Granulocytes: 0 %
Lymphocytes Relative: 42 %
Lymphs Abs: 2.3 10*3/uL (ref 0.7–4.0)
MCH: 27.8 pg (ref 26.0–34.0)
MCHC: 32.1 g/dL (ref 30.0–36.0)
MCV: 86.5 fL (ref 80.0–100.0)
Monocytes Absolute: 0.5 10*3/uL (ref 0.1–1.0)
Monocytes Relative: 9 %
Neutro Abs: 2.4 10*3/uL (ref 1.7–7.7)
Neutrophils Relative %: 46 %
Platelet Count: 244 10*3/uL (ref 150–400)
RBC: 3.78 MIL/uL — ABNORMAL LOW (ref 3.87–5.11)
RDW: 13.8 % (ref 11.5–15.5)
WBC Count: 5.4 10*3/uL (ref 4.0–10.5)
nRBC: 0 % (ref 0.0–0.2)

## 2023-05-31 LAB — BASIC METABOLIC PANEL - CANCER CENTER ONLY
Anion gap: 6 (ref 5–15)
BUN: 11 mg/dL (ref 6–20)
CO2: 23 mmol/L (ref 22–32)
Calcium: 8.7 mg/dL — ABNORMAL LOW (ref 8.9–10.3)
Chloride: 110 mmol/L (ref 98–111)
Creatinine: 1 mg/dL (ref 0.44–1.00)
GFR, Estimated: >60 mL/min (ref >60)
Glucose, Bld: 82 mg/dL (ref 70–99)
Potassium: 3.5 mmol/L (ref 3.5–5.1)
Sodium: 139 mmol/L (ref 135–145)

## 2023-05-31 LAB — FERRITIN: Ferritin: 7 ng/mL — ABNORMAL LOW (ref 11–307)

## 2023-05-31 LAB — D-DIMER, QUANTITATIVE: D-Dimer, Quant: 0.29 ug/mL-FEU (ref 0.00–0.50)

## 2023-06-17 ENCOUNTER — Other Ambulatory Visit (HOSPITAL_COMMUNITY): Payer: Self-pay

## 2023-06-18 ENCOUNTER — Other Ambulatory Visit (HOSPITAL_COMMUNITY): Payer: Self-pay

## 2023-06-25 DIAGNOSIS — D472 Monoclonal gammopathy: Secondary | ICD-10-CM | POA: Insufficient documentation

## 2023-07-01 DIAGNOSIS — Z8249 Family history of ischemic heart disease and other diseases of the circulatory system: Secondary | ICD-10-CM | POA: Diagnosis not present

## 2023-07-01 DIAGNOSIS — Z833 Family history of diabetes mellitus: Secondary | ICD-10-CM | POA: Diagnosis not present

## 2023-07-01 DIAGNOSIS — G4700 Insomnia, unspecified: Secondary | ICD-10-CM | POA: Diagnosis not present

## 2023-07-01 DIAGNOSIS — Z87891 Personal history of nicotine dependence: Secondary | ICD-10-CM | POA: Diagnosis not present

## 2023-07-01 DIAGNOSIS — Z7982 Long term (current) use of aspirin: Secondary | ICD-10-CM | POA: Diagnosis not present

## 2023-07-01 DIAGNOSIS — Z818 Family history of other mental and behavioral disorders: Secondary | ICD-10-CM | POA: Diagnosis not present

## 2023-07-01 DIAGNOSIS — D6859 Other primary thrombophilia: Secondary | ICD-10-CM | POA: Diagnosis not present

## 2023-07-01 DIAGNOSIS — M1990 Unspecified osteoarthritis, unspecified site: Secondary | ICD-10-CM | POA: Diagnosis not present

## 2023-07-01 DIAGNOSIS — I10 Essential (primary) hypertension: Secondary | ICD-10-CM | POA: Diagnosis not present

## 2023-07-01 DIAGNOSIS — J45909 Unspecified asthma, uncomplicated: Secondary | ICD-10-CM | POA: Diagnosis not present

## 2023-07-01 DIAGNOSIS — E669 Obesity, unspecified: Secondary | ICD-10-CM | POA: Diagnosis not present

## 2023-07-01 DIAGNOSIS — Z823 Family history of stroke: Secondary | ICD-10-CM | POA: Diagnosis not present

## 2023-07-17 ENCOUNTER — Other Ambulatory Visit (HOSPITAL_COMMUNITY): Payer: Self-pay

## 2023-07-23 ENCOUNTER — Other Ambulatory Visit (HOSPITAL_COMMUNITY): Payer: Self-pay

## 2023-08-19 ENCOUNTER — Other Ambulatory Visit (HOSPITAL_COMMUNITY): Payer: Self-pay

## 2023-09-05 ENCOUNTER — Other Ambulatory Visit (HOSPITAL_COMMUNITY): Payer: Self-pay

## 2023-09-05 ENCOUNTER — Ambulatory Visit: Payer: 59 | Admitting: Obstetrics and Gynecology

## 2023-09-05 ENCOUNTER — Encounter: Payer: Self-pay | Admitting: Obstetrics and Gynecology

## 2023-09-05 ENCOUNTER — Other Ambulatory Visit: Payer: Self-pay

## 2023-09-05 VITALS — BP 131/86 | HR 67 | Wt 190.0 lb

## 2023-09-05 DIAGNOSIS — N939 Abnormal uterine and vaginal bleeding, unspecified: Secondary | ICD-10-CM | POA: Diagnosis not present

## 2023-09-05 DIAGNOSIS — Z1331 Encounter for screening for depression: Secondary | ICD-10-CM | POA: Diagnosis not present

## 2023-09-05 MED ORDER — NORETHINDRONE 0.35 MG PO TABS
1.0000 | ORAL_TABLET | Freq: Every day | ORAL | 11 refills | Status: DC
Start: 2023-09-05 — End: 2023-11-20
  Filled 2023-09-05: qty 28, 28d supply, fill #0

## 2023-09-05 NOTE — Progress Notes (Signed)
NEW GYNECOLOGY PATIENT Patient name: Barbara Dunn MRN [id number]  Date of birth: [birth year] Chief Complaint:   Gynecologic Exam     History:  Barbara Dunn is a 52 y.o. No obstetric history on file. being seen today for AUB.   Was on birth control for many years due to heavy periods then got a blood clot and was put on eliquis. Stopped birth control June 15. Since being off birth ctonrl. Changing pad/tampon every 1.5-2 hrs. 3d, 2x a month of menses.   Not interested in IUD      Gynecologic History Patient's last menstrual period was 08/16/2023. Contraception: none Last Pap:     Component Value Date/Time   DIAGPAP  11/20/2022 0855    - Negative for Intraepithelial Lesions or Malignancy (NILM)   DIAGPAP - Benign reactive/reparative changes 11/20/2022 0855   DIAGPAP  11/02/2019 0936    - Negative for Intraepithelial Lesions or Malignancy (NILM)   DIAGPAP  11/02/2019 0936    - Benign reactive/reparative changes including parakeratosis   HPVHIGH Negative 11/20/2022 0855   HPVHIGH Negative 11/02/2019 0936   ADEQPAP  11/20/2022 0855    Satisfactory for evaluation; transformation zone component PRESENT.   ADEQPAP  11/02/2019 0936    Satisfactory for evaluation; transformation zone component PRESENT.   ADEQPAP  10/09/2018 0000    Satisfactory for evaluation  endocervical/transformation zone component PRESENT.    High Risk HPV: Positive  Adequacy:  Satisfactory for evaluation, transformation zone component PRESENT  Diagnosis:  Atypical squamous cells of undetermined significance (ASC-US)  Last Mammogram:  04/2023 BIRADS 1 Last Colonoscopy:  01/2023  Obstetric History OB History  No obstetric history on file.    Past Medical History:  Diagnosis Date   Anemia    Arthritis    Asthma    Bronchitis    Chronic kidney disease    Hypertension    Kidney stone    Migraines    Obesity     Past Surgical History:  Procedure Laterality Date   CARDIAC CATHETERIZATION N/A 08/02/2015    Procedure: Left Heart Cath and Coronary Angiography;  Surgeon: Lennette Bihari, MD;  Location: [facility] INVASIVE CV LAB;  Service: Cardiovascular;  Laterality: N/A;   CERVIX LESION DESTRUCTION     TUBAL LIGATION     TUBAL LIGATION      Current Outpatient Medications on File Prior to Visit  Medication Sig Dispense Refill   albuterol (VENTOLIN HFA) 108 (90 Base) MCG/ACT inhaler Inhale 1 puff into the lungs every 6 (six) hours as needed for wheezing and shortness of breath 6.7 g 1   amLODipine (NORVASC) 2.5 MG tablet Take 1 tablet (2.5 mg total) by mouth daily. 90 tablet 1   APPLE CIDER VINEGAR PO Take 1 capsule by mouth daily.     docusate sodium (COLACE) 100 MG capsule Take 100 mg by mouth as needed.      ELDERBERRY PO Take by mouth.     fluticasone (FLONASE) 50 MCG/ACT nasal spray PLACE 2 SPRAYS INTO BOTH NOSTRILS DAILY. 16 g 2   fluticasone (FLOVENT HFA) 110 MCG/ACT inhaler INHALE 1 PUFF INTO THE LUNGS 2 (TWO) TIMES DAILY. 12 g 2   lisinopril (ZESTRIL) 20 MG tablet Take 1 tablet (20 mg total) by mouth daily. 90 tablet 1   meclizine (ANTIVERT) 25 MG tablet Take 1 tablet (25 mg total) by mouth 3 (three) times daily as needed for dizziness. 60 tablet 1   Multiple Vitamins-Minerals (MULTIVITAMIN ADULT PO) Take 1 tablet by mouth daily.  oxymetazoline (AFRIN NASAL SPRAY) 0.05 % nasal spray Place 1 spray into both nostrils 2 (two) times daily. 30 mL 0   SUMAtriptan (IMITREX) 50 MG tablet TAKE 2 TABLETS (100 MG TOTAL) BY MOUTH ONCE FOR 1 DOSE. MAY REPEAT IN 2 HOURS IF HEADACHE PERSISTS, MAX 4 TABLETS/DAY 10 tablet 6   topiramate (TOPAMAX) 50 MG tablet Take 1 tablet (50 mg total) by mouth 2 (two) times daily. 60 tablet 6   triamcinolone cream (KENALOG) 0.1 % APPLY 1 APPLICATION TOPICALLY 2 (TWO) TIMES DAILY. 30 g 2   vitamin C (ASCORBIC ACID) 250 MG tablet Take 250 mg by mouth daily.     apixaban (ELIQUIS) 5 MG TABS tablet Take 1 tablet (5 mg total) by mouth 2 (two) times daily. Start taking after  completion of starter pack. (Patient not taking: Reported on 09/05/2023) 60 tablet 4   aspirin 81 MG tablet Take 81 mg by mouth daily.     BIOTIN PO Take 2 tablets by mouth daily.      cetirizine (ZYRTEC) 10 MG tablet Take 10 mg by mouth daily.     [employer] FIBER GUMMY BEARS CHILDREN PO Take by mouth.     hydrOXYzine (ATARAX) 25 MG tablet Take 1 tablet (25 mg total) by mouth at bedtime as needed. (Patient not taking: Reported on 09/05/2023) 30 tablet 1   [DISCONTINUED] diphenhydrAMINE (BENADRYL) 25 mg capsule Take 25 mg by mouth every 6 (six) hours as needed for itching. Reported on 01/23/2016     No current facility-administered medications on file prior to visit.    No Known Allergies  Social History:  reports that she quit smoking about 19 years ago. Her smoking use included cigarettes. She has never used smokeless tobacco. She reports current alcohol use of about 2.0 standard drinks of alcohol per week. She reports that she does not use drugs.  Family History  Problem Relation Age of Onset   Hypertension Mother    Schizophrenia Mother    Schizophrenia Sister    Schizophrenia Brother    Schizophrenia Brother    Colon cancer Neg Hx    Esophageal cancer Neg Hx    Colon polyps Neg Hx    Rectal cancer Neg Hx    Stomach cancer Neg Hx     The following portions of the patient's history were reviewed and updated as appropriate: allergies, current medications, past family history, past medical history, past social history, past surgical history and problem list.  Review of Systems Pertinent items noted in HPI and remainder of comprehensive ROS otherwise negative.  Physical Exam:  BP (!) 125/90   Wt 190 lb (86.2 kg)   LMP 08/16/2023 Comment: lasted four days  BMI 32.61 kg/m  Physical Exam Vitals and nursing note reviewed.  Constitutional:      Appearance: Normal appearance.  Cardiovascular:     Rate and Rhythm: Normal rate.  Pulmonary:     Effort: Pulmonary effort is normal.      Breath sounds: Normal breath sounds.  Neurological:     General: No focal deficit present.     Mental Status: She is alert and oriented to person, place, and time.  Psychiatric:        Mood and Affect: Mood normal.        Behavior: Behavior normal.        Thought Content: Thought content normal.        Judgment: Judgment normal.        Assessment and Plan:  1. Abnormal uterine bleeding (AUB) Return of baseline AUB after cessation of OCP. 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 Noted medical and surgical options for management of bleeding. Will follow up US and trial of POP for bleeding mnagement.  - norethindrone (MICRONOR) 0.35 MG tablet; Take 1 tablet (0.35 mg total) by mouth daily.  Dispense: 28 tablet; Refill: 11 - US PELVIC COMPLETE WITH TRANSVAGINAL; Future    Routine preventative health maintenance measures emphasized. Please refer to After Visit Summary for other counseling recommendations.   Follow-up: No follow-ups on file.      Lorriane Shire, MD Obstetrician & Gynecologist, Faculty Practice [facility] for [employer], [facility]

## 2023-09-14 ENCOUNTER — Emergency Department (HOSPITAL_COMMUNITY)
Admission: EM | Admit: 2023-09-14 | Discharge: 2023-09-14 | Disposition: A | Discharge status: Home / Self Care | Payer: 59 | Attending: Emergency Medicine | Admitting: Emergency Medicine

## 2023-09-14 ENCOUNTER — Emergency Department (HOSPITAL_BASED_OUTPATIENT_CLINIC_OR_DEPARTMENT_OTHER): Payer: 59

## 2023-09-14 ENCOUNTER — Emergency Department (HOSPITAL_COMMUNITY): Payer: 59

## 2023-09-14 ENCOUNTER — Other Ambulatory Visit: Payer: Self-pay

## 2023-09-14 DIAGNOSIS — M7989 Other specified soft tissue disorders: Secondary | ICD-10-CM | POA: Diagnosis not present

## 2023-09-14 DIAGNOSIS — I824Y2 Acute embolism and thrombosis of unspecified deep veins of left proximal lower extremity: Secondary | ICD-10-CM | POA: Diagnosis not present

## 2023-09-14 DIAGNOSIS — Z7982 Long term (current) use of aspirin: Secondary | ICD-10-CM | POA: Diagnosis not present

## 2023-09-14 DIAGNOSIS — M1712 Unilateral primary osteoarthritis, left knee: Secondary | ICD-10-CM | POA: Diagnosis not present

## 2023-09-14 DIAGNOSIS — R609 Edema, unspecified: Secondary | ICD-10-CM | POA: Diagnosis not present

## 2023-09-14 DIAGNOSIS — M79605 Pain in left leg: Secondary | ICD-10-CM | POA: Diagnosis not present

## 2023-09-14 DIAGNOSIS — Z7901 Long term (current) use of anticoagulants: Secondary | ICD-10-CM | POA: Insufficient documentation

## 2023-09-14 DIAGNOSIS — I82402 Acute embolism and thrombosis of unspecified deep veins of left lower extremity: Secondary | ICD-10-CM | POA: Diagnosis not present

## 2023-09-14 DIAGNOSIS — M25562 Pain in left knee: Secondary | ICD-10-CM | POA: Diagnosis not present

## 2023-09-14 LAB — BASIC METABOLIC PANEL
Anion gap: 8 (ref 5–15)
BUN: 9 mg/dL (ref 6–20)
CO2: 20 mmol/L — ABNORMAL LOW (ref 22–32)
Calcium: 8.5 mg/dL — ABNORMAL LOW (ref 8.9–10.3)
Chloride: 108 mmol/L (ref 98–111)
Creatinine, Ser: 0.91 mg/dL (ref 0.44–1.00)
GFR, Estimated: >60 mL/min (ref >60)
Glucose, Bld: 126 mg/dL — ABNORMAL HIGH (ref 70–99)
Potassium: 3.4 mmol/L — ABNORMAL LOW (ref 3.5–5.1)
Sodium: 136 mmol/L (ref 135–145)

## 2023-09-14 LAB — CBC WITH DIFFERENTIAL/PLATELET
Abs Immature Granulocytes: 0.01 10*3/uL (ref 0.00–0.07)
Basophils Absolute: 0.1 10*3/uL (ref 0.0–0.1)
Basophils Relative: 1 %
Eosinophils Absolute: 0.2 10*3/uL (ref 0.0–0.5)
Eosinophils Relative: 3 %
HCT: 32.7 % — ABNORMAL LOW (ref 36.0–46.0)
Hemoglobin: 10 g/dL — ABNORMAL LOW (ref 12.0–15.0)
Immature Granulocytes: 0 %
Lymphocytes Relative: 44 %
Lymphs Abs: 3.4 10*3/uL (ref 0.7–4.0)
MCH: 25.4 pg — ABNORMAL LOW (ref 26.0–34.0)
MCHC: 30.6 g/dL (ref 30.0–36.0)
MCV: 83 fL (ref 80.0–100.0)
Monocytes Absolute: 0.5 10*3/uL (ref 0.1–1.0)
Monocytes Relative: 7 %
Neutro Abs: 3.6 10*3/uL (ref 1.7–7.7)
Neutrophils Relative %: 45 %
Platelets: 253 10*3/uL (ref 150–400)
RBC: 3.94 MIL/uL (ref 3.87–5.11)
RDW: 17.1 % — ABNORMAL HIGH (ref 11.5–15.5)
WBC: 7.9 10*3/uL (ref 4.0–10.5)
nRBC: 0 % (ref 0.0–0.2)

## 2023-09-14 MED ORDER — APIXABAN (ELIQUIS) VTE STARTER PACK (10MG AND 5MG): ORAL_TABLET | ORAL | 0 refills | Status: DC

## 2023-09-14 MED ORDER — APIXABAN (ELIQUIS) EDUCATION KIT FOR DVT/PE PATIENTS
PACK | Freq: Once | Status: AC
  Filled 2023-09-14 (×2): qty 1

## 2023-09-14 MED ORDER — ONDANSETRON 4 MG PO TBDP
8.0000 mg | ORAL_TABLET | Freq: Once | ORAL | Status: AC
  Administered 2023-09-14: 8 mg via ORAL
  Filled 2023-09-14: qty 2

## 2023-09-14 MED ORDER — APIXABAN 5 MG PO TABS
10.0000 mg | ORAL_TABLET | Freq: Once | ORAL | Status: AC
  Administered 2023-09-14: 10 mg via ORAL
  Filled 2023-09-14: qty 2

## 2023-09-14 MED ORDER — HYDROCODONE-ACETAMINOPHEN 5-325 MG PO TABS
1.0000 | ORAL_TABLET | Freq: Once | ORAL | Status: AC
Start: 2023-09-14 — End: 2023-09-14
  Administered 2023-09-14: 1 via ORAL
  Filled 2023-09-14: qty 1

## 2023-09-14 NOTE — Progress Notes (Signed)
VASCULAR LAB    Left lower extremity venous duplex has been performed.  See CV proc for preliminary results.  Messaged results to Marita Kansas, PA-C via secure chat  Sherren Kerns, RVT 09/14/2023, 1:59 PM

## 2023-09-14 NOTE — ED Notes (Signed)
Pt transferred to vascular

## 2023-09-14 NOTE — ED Notes (Signed)
Pt declines pain medicine at this time

## 2023-09-14 NOTE — ED Triage Notes (Signed)
Pt. Stated, I'm having pain in my upper, knee and groin area on left side. Started 2 days ago. I had a previous clot on the right.

## 2023-09-14 NOTE — Discharge Instructions (Signed)
Ultrasound showed evidence of DVT.  I have sent Eliquis into the pharmacy for you.  Of also given a DVT clinic referral.  If you develop chest pain, shortness of breath return to the emergency room.

## 2023-09-14 NOTE — ED Provider Notes (Signed)
  Physical Exam  BP 108/65   Pulse 88   Temp 98.6 F (37 C) (Oral)   Resp 14   Ht 5\' 2"  (1.575 m)   Wt 83.9 kg   LMP 08/16/2023 Comment: lasted four days  SpO2 100%   BMI 33.84 kg/m   Physical Exam  Procedures  Procedures  ED Course / MDM   Clinical Course as of 09/14/23 1657  Sat Sep 14, 2023  1523 S- positive DVT study. Going home on Eliquis -Fu labs, then DC [HJ]    Clinical Course User Index [HJ] Janyth Pupa, MD   Medical Decision Making Amount and/or Complexity of Data Reviewed Labs: ordered. Radiology: ordered.  Risk Prescription drug management.   BMP with mild hypokalemia, otherwise reassuring.  CBC with anemia around the patient's baseline.  Discussed the results of blood work with the patient, who voiced understanding.  Eliquis prescription was sent to the pharmacy by off going provider.  Patient reported she had no questions regarding her prescription.  Encouraged her to follow-up with her PCP.  Return precautions were discussed, patient voiced understanding.  She is discharged in stable condition.       Janyth Pupa, MD 09/14/23 1727    Charlynne Pander, MD 09/14/23 [phone]

## 2023-09-14 NOTE — ED Provider Notes (Signed)
[place] EMERGENCY DEPARTMENT AT [facility] Provider Note   CSN: [id number] Arrival date & time: 09/14/23  1046     History  Chief Complaint  Patient presents with   Leg Pain   Knee Pain   Groin Pain    Sonia Hilke is a 52 y.o. female.  52 year old female presents today for concern of left leg pain that has been ongoing for the past 3 days.  Predominantly surrounding the knee.  Has history of DVT in the right leg.  Came off of  Eliquis about 2 to 3 months ago.  That was provoked by birth control.  She also states she started birth control Sunday.  She was reassured by her gynecologist as well as pharmacist that that particular birth control she just started with not because DVT based on the review.  Denies chest pain, shortness of breath.  Denies any injury to the knee.  The history is provided by the patient. No language interpreter was used.       Home Medications Prior to Admission medications   Medication Sig Start Date End Date Taking? Authorizing Provider  APIXABAN (ELIQUIS) VTE STARTER PACK (10MG  AND 5MG ) Take as directed on package: start with two-5mg  tablets twice daily for 7 days. On day 8, switch to one-5mg  tablet twice daily. 09/14/23  Yes Astou Lada, PA-C  albuterol (VENTOLIN HFA) 108 (90 Base) MCG/ACT inhaler Inhale 1 puff into the lungs every 6 (six) hours as needed for wheezing and shortness of breath 07/18/22   Hoy Register, MD  amLODipine (NORVASC) 2.5 MG tablet Take 1 tablet (2.5 mg total) by mouth daily. 05/14/23   Hoy Register, MD  APPLE CIDER VINEGAR PO Take 1 capsule by mouth daily.    [provider]  aspirin 81 MG tablet Take 81 mg by mouth daily.    [provider]  BIOTIN PO Take 2 tablets by mouth daily.     [provider]  cetirizine (ZYRTEC) 10 MG tablet Take 10 mg by mouth daily.    [provider]  [employer] FIBER GUMMY BEARS CHILDREN PO Take by mouth.    [provider]  docusate sodium  (COLACE) 100 MG capsule Take 100 mg by mouth as needed.     [provider]  ELDERBERRY PO Take by mouth.    [provider]  fluticasone (FLONASE) 50 MCG/ACT nasal spray PLACE 2 SPRAYS INTO BOTH NOSTRILS DAILY. 01/11/20   Hoy Register, MD  fluticasone (FLOVENT HFA) 110 MCG/ACT inhaler INHALE 1 PUFF INTO THE LUNGS 2 (TWO) TIMES DAILY. 07/18/22   Hoy Register, MD  hydrOXYzine (ATARAX) 25 MG tablet Take 1 tablet (25 mg total) by mouth at bedtime as needed. Patient not taking: Reported on 09/05/2023 05/14/23   Hoy Register, MD  lisinopril (ZESTRIL) 20 MG tablet Take 1 tablet (20 mg total) by mouth daily. 05/14/23   Hoy Register, MD  meclizine (ANTIVERT) 25 MG tablet Take 1 tablet (25 mg total) by mouth 3 (three) times daily as needed for dizziness. 05/08/22   Hoy Register, MD  Multiple Vitamins-Minerals (MULTIVITAMIN ADULT PO) Take 1 tablet by mouth daily.     [provider]  norethindrone (MICRONOR) 0.35 MG tablet Take 1 tablet (0.35 mg total) by mouth daily. 09/05/23   Lorriane Shire, MD  oxymetazoline (AFRIN NASAL SPRAY) 0.05 % nasal spray Place 1 spray into both nostrils 2 (two) times daily. 10/31/21   Hoy Register, MD  SUMAtriptan (IMITREX) 50 MG tablet TAKE 2 TABLETS (  100 MG TOTAL) BY MOUTH ONCE FOR 1 DOSE. MAY REPEAT IN 2 HOURS IF HEADACHE PERSISTS, MAX 4 TABLETS/DAY 11/12/22 11/12/23  Hoy Register, MD  topiramate (TOPAMAX) 50 MG tablet Take 1 tablet (50 mg total) by mouth 2 (two) times daily. 11/12/22   Hoy Register, MD  triamcinolone cream (KENALOG) 0.1 % APPLY 1 APPLICATION TOPICALLY 2 (TWO) TIMES DAILY. 11/20/22   Hoy Register, MD  vitamin C (ASCORBIC ACID) 250 MG tablet Take 250 mg by mouth daily.    [provider]  diphenhydrAMINE (BENADRYL) 25 mg capsule Take 25 mg by mouth every 6 (six) hours as needed for itching. Reported on 01/23/2016  12/07/19  [provider]      Allergies    Patient has no known allergies.     Review of Systems   Review of Systems  Constitutional:  Negative for fever.  Respiratory:  Negative for shortness of breath.   Cardiovascular:  Negative for chest pain.  Musculoskeletal:  Positive for arthralgias and myalgias.  All other systems reviewed and are negative.   Physical Exam Updated Vital Signs BP (!) 148/95 (BP Location: Right Arm)   Pulse 90   Temp 98.6 F (37 C)   Resp 16   Ht 5\' 2"  (1.575 m)   Wt 83.9 kg   LMP 08/16/2023 Comment: lasted four days  SpO2 100%   BMI 33.84 kg/m  Physical Exam Vitals and nursing note reviewed.  Constitutional:      General: She is not in acute distress.    Appearance: Normal appearance. She is not ill-appearing.  HENT:     Head: Normocephalic and atraumatic.     Nose: Nose normal.  Eyes:     General: No scleral icterus.    Extraocular Movements: Extraocular movements intact.     Conjunctiva/sclera: Conjunctivae normal.  Cardiovascular:     Rate and Rhythm: Normal rate and regular rhythm.     Heart sounds: Normal heart sounds.  Pulmonary:     Effort: Pulmonary effort is normal. No respiratory distress.     Breath sounds: Normal breath sounds. No wheezing or rales.  Abdominal:     General: There is no distension.     Tenderness: There is no abdominal tenderness.  Musculoskeletal:        General: Normal range of motion.     Cervical back: Normal range of motion.  Skin:    General: Skin is warm and dry.  Neurological:     General: No focal deficit present.     Mental Status: She is alert. Mental status is at baseline.     ED Results / Procedures / Treatments   Labs (all labs ordered are listed, but only abnormal results are displayed) Labs Reviewed  CBC WITH DIFFERENTIAL/PLATELET  BASIC METABOLIC PANEL    EKG None  Radiology VAS [place] LOWER EXTREMITY VENOUS (DVT) (7a-7p)  Result Date: 09/14/2023  Lower Venous DVT Study Patient Name:  Elizbeth Anguiano  Date of Exam:   09/14/2023 Medical Rec #: [id number]     Accession #:    [id number] Date of Birth: November 01, 1970     Patient Gender: F Patient Age:   26 years Exam Location:  [facility] Procedure:      VAS [place] LOWER EXTREMITY VENOUS (DVT) Referring Phys: Britany Callicott --------------------------------------------------------------------------------  Indications: Pain, and Swelling X 2 days. Patient started new birth control 1 week ago.  Risk Factors: DVT Right peroneal 02/01/23, no longer on Eliquis. Comparison Study: No prior left lower  extremity venous study on file Performing Technologist: Sherren Kerns RVS  Examination Guidelines: A complete evaluation includes B-mode imaging, spectral Doppler, color Doppler, and power Doppler as needed of all accessible portions of each vessel. Bilateral testing is considered an integral part of a complete examination. Limited examinations for reoccurring indications may be performed as noted. The reflux portion of the exam is performed with the patient in reverse Trendelenburg.  +-----+---------------+---------+-----------+----------+--------------+ RIGHTCompressibilityPhasicitySpontaneityPropertiesThrombus Aging +-----+---------------+---------+-----------+----------+--------------+ CFV  Full           Yes      Yes                                 +-----+---------------+---------+-----------+----------+--------------+   +---------+---------------+---------+-----------+----------+--------------+ LEFT     CompressibilityPhasicitySpontaneityPropertiesThrombus Aging +---------+---------------+---------+-----------+----------+--------------+ CFV      Full           Yes      Yes                                 +---------+---------------+---------+-----------+----------+--------------+ SFJ      Full                                                        +---------+---------------+---------+-----------+----------+--------------+ FV Prox  Full                                                         +---------+---------------+---------+-----------+----------+--------------+ FV Mid   Full                                                        +---------+---------------+---------+-----------+----------+--------------+ FV DistalNone           No       No                   Acute          +---------+---------------+---------+-----------+----------+--------------+ PFV      Full                                                        +---------+---------------+---------+-----------+----------+--------------+ POP      Partial        Yes      No                   Acute          +---------+---------------+---------+-----------+----------+--------------+ PTV      Full                                                        +---------+---------------+---------+-----------+----------+--------------+  PERO     None                                         Acute          +---------+---------------+---------+-----------+----------+--------------+ Gastroc  Full                                                        +---------+---------------+---------+-----------+----------+--------------+ TP trunk None                                         Acute          +---------+---------------+---------+-----------+----------+--------------+    Summary: RIGHT: - No evidence of common femoral vein obstruction.   LEFT: - Findings consistent with acute deep vein thrombosis involving the left femoral vein, left peroneal veins, left popliteal vein, and Tibioperoneal trunk.  - No cystic structure found in the popliteal fossa.  *See table(s) above for measurements and observations.    Preliminary    DG Knee Complete 4 Views Left  Result Date: 09/14/2023 CLINICAL DATA:  pain and swelling EXAM: LEFT KNEE - COMPLETE 4+ VIEW COMPARISON:  April 04, 2022 FINDINGS: No acute fracture or dislocation. Mild-to-moderate joint space narrowing and osteophyte formation of the medial compartment. Mild  osteophyte formation of the lateral compartment. No area of erosion or osseous destruction. No unexpected radiopaque foreign body. Soft tissues are unremarkable. IMPRESSION: Mild-to-moderate degenerative changes of the medial compartment. Electronically Signed   By: Meda Klinefelter M.D.   On: 09/14/2023 12:49    Procedures Procedures    Medications Ordered in ED Medications  apixaban [facility]) Education Kit for DVT/PE patients (has no administration in time range)  HYDROcodone-acetaminophen (NORCO/VICODIN) 5-325 MG per tablet 1 tablet (has no administration in time range)  ondansetron (ZOFRAN-ODT) disintegrating tablet 8 mg (has no administration in time range)    ED Course/ Medical Decision Making/ A&P                                 Medical Decision Making Amount and/or Complexity of Data Reviewed Labs: ordered. Radiology: ordered.  Risk Prescription drug management.   52 year old female presents today for concern of DVT.  Will obtain ultrasound as well as x-ray.  Will obtain basic blood work as well.  Patient defers pain control at this time. X-ray demonstrates mild to moderate degenerative change.  Ultrasound positive for acute DVT.  Will start patient on Eliquis.  Initially placed patient for discharge but realized that patient's blood work has not been done yet.  Will obtain this prior to discharge.  Notified nurse to draw the labs.  Signout to oncoming provider pending labs.  Okay for discharge if labs without acute finding.   Final Clinical Impression(s) / ED Diagnoses Final diagnoses:  Acute deep vein thrombosis (DVT) of proximal vein of left lower extremity (HCC)    Rx / DC Orders ED Discharge Orders          Ordered    APIXABAN (ELIQUIS) VTE STARTER PACK (10MG  AND 5MG )       Note  to Pharmacy: If starter pack unavailable, substitute with seventy-four 5 mg apixaban tabs following the above SIG directions.   09/14/23 1428    AMB Referral to Deep Vein  Thrombosis Clinic       Comments: Please call [phone] with any questions.   09/14/23 1428              Marita Kansas, PA-C 09/14/23 1459    Gloris Manchester, MD 09/14/23 [phone]

## 2023-09-15 ENCOUNTER — Encounter (HOSPITAL_BASED_OUTPATIENT_CLINIC_OR_DEPARTMENT_OTHER): Payer: Self-pay | Admitting: *Deleted

## 2023-09-15 ENCOUNTER — Emergency Department (HOSPITAL_BASED_OUTPATIENT_CLINIC_OR_DEPARTMENT_OTHER)
Admission: EM | Admit: 2023-09-15 | Discharge: 2023-09-16 | Disposition: A | Discharge status: Home / Self Care | Payer: 59 | Attending: Emergency Medicine | Admitting: Emergency Medicine

## 2023-09-15 ENCOUNTER — Other Ambulatory Visit: Payer: Self-pay

## 2023-09-15 DIAGNOSIS — Z79899 Other long term (current) drug therapy: Secondary | ICD-10-CM | POA: Diagnosis not present

## 2023-09-15 DIAGNOSIS — Z7901 Long term (current) use of anticoagulants: Secondary | ICD-10-CM | POA: Insufficient documentation

## 2023-09-15 DIAGNOSIS — I82412 Acute embolism and thrombosis of left femoral vein: Secondary | ICD-10-CM | POA: Diagnosis not present

## 2023-09-15 DIAGNOSIS — Z7982 Long term (current) use of aspirin: Secondary | ICD-10-CM | POA: Insufficient documentation

## 2023-09-15 DIAGNOSIS — I10 Essential (primary) hypertension: Secondary | ICD-10-CM | POA: Insufficient documentation

## 2023-09-15 DIAGNOSIS — M79605 Pain in left leg: Secondary | ICD-10-CM | POA: Diagnosis present

## 2023-09-15 HISTORY — DX: Acute embolism and thrombosis of unspecified deep veins of unspecified lower extremity: I82.409

## 2023-09-15 LAB — CBC WITH DIFFERENTIAL/PLATELET
Abs Immature Granulocytes: 0.01 10*3/uL (ref 0.00–0.07)
Basophils Absolute: 0.1 10*3/uL (ref 0.0–0.1)
Basophils Relative: 1 %
Eosinophils Absolute: 0.2 10*3/uL (ref 0.0–0.5)
Eosinophils Relative: 3 %
HCT: 32.6 % — ABNORMAL LOW (ref 36.0–46.0)
Hemoglobin: 10.1 g/dL — ABNORMAL LOW (ref 12.0–15.0)
Immature Granulocytes: 0 %
Lymphocytes Relative: 43 %
Lymphs Abs: 3.4 10*3/uL (ref 0.7–4.0)
MCH: 25.5 pg — ABNORMAL LOW (ref 26.0–34.0)
MCHC: 31 g/dL (ref 30.0–36.0)
MCV: 82.3 fL (ref 80.0–100.0)
Monocytes Absolute: 0.7 10*3/uL (ref 0.1–1.0)
Monocytes Relative: 9 %
Neutro Abs: 3.3 10*3/uL (ref 1.7–7.7)
Neutrophils Relative %: 44 %
Platelets: 270 10*3/uL (ref 150–400)
RBC: 3.96 MIL/uL (ref 3.87–5.11)
RDW: 17.2 % — ABNORMAL HIGH (ref 11.5–15.5)
WBC: 7.6 10*3/uL (ref 4.0–10.5)
nRBC: 0 % (ref 0.0–0.2)

## 2023-09-15 MED ORDER — OXYCODONE-ACETAMINOPHEN 5-325 MG PO TABS
1.0000 | ORAL_TABLET | Freq: Once | ORAL | Status: AC
  Administered 2023-09-15: 1 via ORAL
  Filled 2023-09-15: qty 1

## 2023-09-15 NOTE — Discharge Instructions (Addendum)
Take the blood thinner as prescribed.  Call the DVT clinic in the morning for an appointment.  It is important you see your PCP or the DVT clinic to get your Eliquis refilled before it runs out.  Keep leg elevated and use a compression stocking to help with the pain.  Return to the ED for worsening pain, weakness, numbness, tingling, chest pain, shortness of breath or other concerns.

## 2023-09-15 NOTE — ED Triage Notes (Signed)
Pt reporting pain in the left upper leg, pt says that she found out she has a blood clot in her left leg, she was started on eliquis yesterday. She is having increased pain and swelling in her leg. Has been taking ibuprofen for the pain.

## 2023-09-15 NOTE — ED Provider Notes (Signed)
[place] EMERGENCY DEPARTMENT AT [facility] Provider Note   CSN: [id number] Arrival date & time: 09/15/23  2231     History {Add pertinent medical, surgical, social history, OB history to HPI:1} Chief Complaint  Patient presents with   Leg Pain    Barbara Dunn is a 52 y.o. female.  Patient with a history of hypertension, previous DVT here with left leg pain.  She was seen yesterday and diagnosed with a DVT in her left leg. She was started on eliquis and has 3 doses so far. She returns today with increasing pain to her L groin and thigh. No weakness, numbness, tingling. No trauma. No bowel or bladder incontinence.  She had a DVT previously on her right leg but is back on Eliquis now.  No chest pain or shortness of breath.  No focal weakness, numbness or tingling.  No bowel or bladder incontinence.  No history of IV drug abuse or cancer. She has been taking ibuprofen at home for pain without relief.  Reports the pain is in the same location but worsened yesterday.  Has not moved at all.  No chest pain or shortness of breath.  The history is provided by the patient.  Leg Pain Associated symptoms: no fever        Home Medications Prior to Admission medications   Medication Sig Start Date End Date Taking? Authorizing Provider  albuterol (VENTOLIN HFA) 108 (90 Base) MCG/ACT inhaler Inhale 1 puff into the lungs every 6 (six) hours as needed for wheezing and shortness of breath 07/18/22   Hoy Register, MD  amLODipine (NORVASC) 2.5 MG tablet Take 1 tablet (2.5 mg total) by mouth daily. 05/14/23   Hoy Register, MD  APIXABAN Everlene Balls) VTE STARTER PACK (10MG  AND 5MG ) Take as directed on package: start with two-5mg  tablets twice daily for 7 days. On day 8, switch to one-5mg  tablet twice daily. 09/14/23   Derwood Kaplan, MD  APPLE CIDER VINEGAR PO Take 1 capsule by mouth daily.    [provider]  aspirin 81 MG tablet Take 81 mg by mouth daily.    [provider]  BIOTIN PO Take 2 tablets by mouth daily.     [provider]  cetirizine (ZYRTEC) 10 MG tablet Take 10 mg by mouth daily.    [provider]  [employer] FIBER GUMMY BEARS CHILDREN PO Take by mouth.    [provider]  docusate sodium (COLACE) 100 MG capsule Take 100 mg by mouth as needed.     [provider]  ELDERBERRY PO Take by mouth.    [provider]  fluticasone (FLONASE) 50 MCG/ACT nasal spray PLACE 2 SPRAYS INTO BOTH NOSTRILS DAILY. 01/11/20   Hoy Register, MD  fluticasone (FLOVENT HFA) 110 MCG/ACT inhaler INHALE 1 PUFF INTO THE LUNGS 2 (TWO) TIMES DAILY. 07/18/22   Hoy Register, MD  hydrOXYzine (ATARAX) 25 MG tablet Take 1 tablet (25 mg total) by mouth at bedtime as needed. Patient not taking: Reported on 09/05/2023 05/14/23   Hoy Register, MD  lisinopril (ZESTRIL) 20 MG tablet Take 1 tablet (20 mg total) by mouth daily. 05/14/23   Hoy Register, MD  meclizine (ANTIVERT) 25 MG tablet Take 1 tablet (25 mg total) by mouth 3 (three) times daily as needed for dizziness. 05/08/22   Hoy Register, MD  Multiple Vitamins-Minerals (MULTIVITAMIN ADULT PO) Take 1 tablet by mouth daily.     [provider]  norethindrone (MICRONOR) 0.35 MG tablet Take 1 tablet (0.35  mg total) by mouth daily. 09/05/23   Lorriane Shire, MD  oxymetazoline (AFRIN NASAL SPRAY) 0.05 % nasal spray Place 1 spray into both nostrils 2 (two) times daily. 10/31/21   Hoy Register, MD  SUMAtriptan (IMITREX) 50 MG tablet TAKE 2 TABLETS (100 MG TOTAL) BY MOUTH ONCE FOR 1 DOSE. MAY REPEAT IN 2 HOURS IF HEADACHE PERSISTS, MAX 4 TABLETS/DAY 11/12/22 11/12/23  Hoy Register, MD  topiramate (TOPAMAX) 50 MG tablet Take 1 tablet (50 mg total) by mouth 2 (two) times daily. 11/12/22   Hoy Register, MD  triamcinolone cream (KENALOG) 0.1 % APPLY 1 APPLICATION TOPICALLY 2 (TWO) TIMES DAILY. 11/20/22   Hoy Register, MD  vitamin C (ASCORBIC ACID) 250 MG tablet Take 250 mg by  mouth daily.    [provider]  diphenhydrAMINE (BENADRYL) 25 mg capsule Take 25 mg by mouth every 6 (six) hours as needed for itching. Reported on 01/23/2016  12/07/19  [provider]      Allergies    Patient has no known allergies.    Review of Systems   Review of Systems  Constitutional:  Negative for activity change, appetite change and fever.  HENT:  Negative for congestion and rhinorrhea.   Respiratory:  Negative for cough, chest tightness and shortness of breath.   Cardiovascular:  Negative for chest pain.  Gastrointestinal:  Negative for abdominal pain, nausea and vomiting.  Genitourinary:  Negative for dysuria and hematuria.  Musculoskeletal:  Positive for arthralgias and myalgias.  Skin:  Negative for rash.  Neurological:  Negative for dizziness, weakness and headaches.   all other systems are negative except as noted in the HPI and PMH.    Physical Exam Updated Vital Signs BP (!) 137/90 (BP Location: Right Arm)   Pulse 88   Temp (!) 97.5 F (36.4 C) (Temporal)   Resp 16   LMP 08/16/2023 Comment: lasted four days  SpO2 100%  Physical Exam Vitals and nursing note reviewed.  Constitutional:      General: She is not in acute distress.    Appearance: She is well-developed.  HENT:     Head: Normocephalic and atraumatic.     Mouth/Throat:     Pharynx: No oropharyngeal exudate.  Eyes:     Conjunctiva/sclera: Conjunctivae normal.     Pupils: Pupils are equal, round, and reactive to light.  Neck:     Comments: No meningismus. Cardiovascular:     Rate and Rhythm: Normal rate and regular rhythm.     Heart sounds: Normal heart sounds. No murmur heard. Pulmonary:     Effort: Pulmonary effort is normal. No respiratory distress.     Breath sounds: Normal breath sounds.  Abdominal:     Palpations: Abdomen is soft.     Tenderness: There is no abdominal tenderness. There is no guarding or rebound.  Musculoskeletal:        General: Swelling and  tenderness present.     Cervical back: Normal range of motion and neck supple.     Comments: Tenderness to palpation of left thigh without overlying erythema.  Intact DP and PT pulse. Compartments are soft.  5/5 strength in bilateral lower extremities. Ankle plantar and dorsiflexion intact. Great toe extension intact bilaterally. +2 DP and PT pulses. +2 patellar reflexes bilaterally. Normal gait.   Skin:    General: Skin is warm.  Neurological:     Mental Status: She is alert and oriented to person, place, and time.     Cranial Nerves: No cranial nerve  deficit.     Motor: No abnormal muscle tone.     Coordination: Coordination normal.     Comments:  5/5 strength throughout. CN 2-12 intact.Equal grip strength.   Psychiatric:        Behavior: Behavior normal.     ED Results / Procedures / Treatments   Labs (all labs ordered are listed, but only abnormal results are displayed) Labs Reviewed  CBC WITH DIFFERENTIAL/PLATELET  BASIC METABOLIC PANEL  CK    EKG None  Radiology VAS [place] LOWER EXTREMITY VENOUS (DVT) (7a-7p)  Result Date: 09/15/2023  Lower Venous DVT Study Patient Name:  Ieasha Rodrigue  Date of Exam:   09/14/2023 Medical Rec #: [id number]    Accession #:    [id number] Date of Birth: 14-Aug-1971     Patient Gender: F Patient Age:   89 years Exam Location:  [facility] Procedure:      VAS [place] LOWER EXTREMITY VENOUS (DVT) Referring Phys: AMJAD ALI --------------------------------------------------------------------------------  Indications: Pain, and Swelling X 2 days. Patient started new birth control 1 week ago.  Risk Factors: DVT Right peroneal 02/01/23, no longer on Eliquis. Comparison Study: No prior left lower extremity venous study on file Performing Technologist: Sherren Kerns RVS  Examination Guidelines: A complete evaluation includes B-mode imaging, spectral Doppler, color Doppler, and power Doppler as needed of all accessible portions of each vessel. Bilateral testing  is considered an integral part of a complete examination. Limited examinations for reoccurring indications may be performed as noted. The reflux portion of the exam is performed with the patient in reverse Trendelenburg.  +-----+---------------+---------+-----------+----------+--------------+ RIGHTCompressibilityPhasicitySpontaneityPropertiesThrombus Aging +-----+---------------+---------+-----------+----------+--------------+ CFV  Full           Yes      Yes                                 +-----+---------------+---------+-----------+----------+--------------+   +---------+---------------+---------+-----------+----------+--------------+ LEFT     CompressibilityPhasicitySpontaneityPropertiesThrombus Aging +---------+---------------+---------+-----------+----------+--------------+ CFV      Full           Yes      Yes                                 +---------+---------------+---------+-----------+----------+--------------+ SFJ      Full                                                        +---------+---------------+---------+-----------+----------+--------------+ FV Prox  Full                                                        +---------+---------------+---------+-----------+----------+--------------+ FV Mid   Full                                                        +---------+---------------+---------+-----------+----------+--------------+ FV DistalNone           No  No                   Acute          +---------+---------------+---------+-----------+----------+--------------+ PFV      Full                                                        +---------+---------------+---------+-----------+----------+--------------+ POP      Partial        Yes      No                   Acute          +---------+---------------+---------+-----------+----------+--------------+ PTV      Full                                                         +---------+---------------+---------+-----------+----------+--------------+ PERO     None                                         Acute          +---------+---------------+---------+-----------+----------+--------------+ Gastroc  Full                                                        +---------+---------------+---------+-----------+----------+--------------+ TP trunk None                                         Acute          +---------+---------------+---------+-----------+----------+--------------+    Summary: RIGHT: - No evidence of common femoral vein obstruction.   LEFT: - Findings consistent with acute deep vein thrombosis involving the left femoral vein, left peroneal veins, left popliteal vein, and Tibioperoneal trunk.  - No cystic structure found in the popliteal fossa.  *See table(s) above for measurements and observations. Electronically signed by Gerarda Fraction on 09/15/2023 at 12:33:27 PM.    Final    DG Knee Complete 4 Views Left  Result Date: 09/14/2023 CLINICAL DATA:  pain and swelling EXAM: LEFT KNEE - COMPLETE 4+ VIEW COMPARISON:  April 04, 2022 FINDINGS: No acute fracture or dislocation. Mild-to-moderate joint space narrowing and osteophyte formation of the medial compartment. Mild osteophyte formation of the lateral compartment. No area of erosion or osseous destruction. No unexpected radiopaque foreign body. Soft tissues are unremarkable. IMPRESSION: Mild-to-moderate degenerative changes of the medial compartment. Electronically Signed   By: Meda Klinefelter M.D.   On: 09/14/2023 12:49    Procedures Procedures  {Document cardiac monitor, telemetry assessment procedure when appropriate:1}  Medications Ordered in ED Medications  oxyCODONE-acetaminophen (PERCOCET/ROXICET) 5-325 MG per tablet 1 tablet (has no administration in time range)    ED Course/ Medical Decision Making/ A&P   {   Click here for ABCD2, HEART and other calculatorsREFRESH Note  before  signing :1}                              Medical Decision Making Amount and/or Complexity of Data Reviewed Labs: ordered. Decision-making details documented in ED Course. Radiology: ordered and independent interpretation performed. Decision-making details documented in ED Course. ECG/medicine tests: ordered and independent interpretation performed. Decision-making details documented in ED Course.  Risk Prescription drug management.   Patient with known left extensive leg DVT.  Returns with worsening pain since yesterday.  No chest pain or shortness of breath.  No hypoxia or increased work of breathing.  Intact distal pulses.  D/w Dr. Karin Lieu of vascular surgery.  He reviewed patient's ultrasound from yesterday.  She does have a distal femoral DVT but no common femoral vein DVT.  Would not recommend thrombectomy currently.  Recommends compression and anticoagulation and pain control. {Document critical care time when appropriate:1} {Document review of labs and clinical decision tools ie heart score, Chads2Vasc2 etc:1}  {Document your independent review of radiology images, and any outside records:1} {Document your discussion with family members, caretakers, and with consultants:1} {Document social determinants of health affecting pt's care:1} {Document your decision making why or why not admission, treatments were needed:1} Final Clinical Impression(s) / ED Diagnoses Final diagnoses:  None    Rx / DC Orders ED Discharge Orders     None

## 2023-09-16 ENCOUNTER — Emergency Department (HOSPITAL_BASED_OUTPATIENT_CLINIC_OR_DEPARTMENT_OTHER): Payer: 59

## 2023-09-16 DIAGNOSIS — R1030 Lower abdominal pain, unspecified: Secondary | ICD-10-CM | POA: Diagnosis not present

## 2023-09-16 DIAGNOSIS — D259 Leiomyoma of uterus, unspecified: Secondary | ICD-10-CM | POA: Diagnosis not present

## 2023-09-16 DIAGNOSIS — N200 Calculus of kidney: Secondary | ICD-10-CM | POA: Diagnosis not present

## 2023-09-16 DIAGNOSIS — K429 Umbilical hernia without obstruction or gangrene: Secondary | ICD-10-CM | POA: Diagnosis not present

## 2023-09-16 LAB — BASIC METABOLIC PANEL
Anion gap: 8 (ref 5–15)
BUN: 15 mg/dL (ref 6–20)
CO2: 20 mmol/L — ABNORMAL LOW (ref 22–32)
Calcium: 8.5 mg/dL — ABNORMAL LOW (ref 8.9–10.3)
Chloride: 110 mmol/L (ref 98–111)
Creatinine, Ser: 0.9 mg/dL (ref 0.44–1.00)
GFR, Estimated: >60 mL/min (ref >60)
Glucose, Bld: 96 mg/dL (ref 70–99)
Potassium: 3.6 mmol/L (ref 3.5–5.1)
Sodium: 138 mmol/L (ref 135–145)

## 2023-09-16 LAB — CK: Total CK: 132 U/L (ref 38–234)

## 2023-09-16 MED ORDER — HYDROCODONE-ACETAMINOPHEN 5-325 MG PO TABS: 2.0000 | ORAL_TABLET | Freq: Four times a day (QID) | ORAL | 0 refills | Status: DC | PRN

## 2023-09-16 MED ORDER — IOHEXOL 350 MG/ML SOLN
150.0000 mL | Freq: Once | INTRAVENOUS | Status: AC | PRN
  Administered 2023-09-16: 150 mL via INTRAVENOUS

## 2023-09-17 ENCOUNTER — Ambulatory Visit: Payer: Self-pay

## 2023-09-17 ENCOUNTER — Other Ambulatory Visit (HOSPITAL_COMMUNITY): Payer: Self-pay

## 2023-09-17 NOTE — Telephone Encounter (Signed)
Scheduled virtual appointment with patient tomorrow at [phone] with PCP.  She is requesting order for [place] to r/o DVT in there right leg.   Patient states she is on Eliquis and also states she is due to take a trip to [place] soon.   Please advise.

## 2023-09-17 NOTE — Telephone Encounter (Signed)
  Chief Complaint: DVT Symptoms: R leg pain, upper portion same sx as LLE with DVT present  Frequency: today  Pertinent Negatives: NA Disposition: [] ED /[] Urgent Care (no appt availability in office) / [] Appointment(In office/virtual)/ []  [place] [facility]/ [] Home Care/ [x] Refused Recommended Disposition /[] [place] [facility]/ []  Follow-up with PCP Additional Notes: pt went to ED x 2 over weekend and has DVT in LLE. Pt states she started having pain in RLE this morning same as LLE. Pt is wanting US done to see if DVT in RLE, refuses to go back to ED. Offered OV today at 1540 with PCP but pt refused. She states she just needs an order placed for [place] this week. Advised I would send message back and have nurse FU with her.   Reason for Disposition  [1] Thigh or calf pain AND [2] only 1 side AND [3] present > 1 hour (Exception: Chronic unchanged pain.)  Answer Assessment - Initial Assessment Questions 1. ONSET: "When did the pain start?"      Today  2. LOCATION: "Where is the pain located?"      R leg upper  3. PAIN: "How bad is the pain?"    (Scale 1-10; or mild, moderate, severe)   -  MILD (1-3): doesn't interfere with normal activities    -  MODERATE (4-7): interferes with normal activities (e.g., work or school) or awakens from sleep, limping    -  SEVERE (8-10): excruciating pain, unable to do any normal activities, unable to walk     Moderate  5. CAUSE: "What do you think is causing the leg pain?"     DVT  6. OTHER SYMPTOMS: "Do you have any other symptoms?" (e.g., chest pain, back pain, breathing difficulty, swelling, rash, fever, numbness, weakness)  Protocols used: Leg Pain-A-AH

## 2023-09-17 NOTE — Telephone Encounter (Signed)
Will address at office visit tomorrow.

## 2023-09-18 ENCOUNTER — Other Ambulatory Visit (HOSPITAL_COMMUNITY): Payer: Self-pay

## 2023-09-18 ENCOUNTER — Other Ambulatory Visit: Payer: Self-pay | Admitting: Family Medicine

## 2023-09-18 ENCOUNTER — Telehealth: Payer: 59 | Admitting: Family Medicine

## 2023-09-18 ENCOUNTER — Encounter: Payer: Self-pay | Admitting: Family Medicine

## 2023-09-18 DIAGNOSIS — I824Y2 Acute embolism and thrombosis of unspecified deep veins of left proximal lower extremity: Secondary | ICD-10-CM | POA: Diagnosis not present

## 2023-09-18 DIAGNOSIS — D649 Anemia, unspecified: Secondary | ICD-10-CM

## 2023-09-18 DIAGNOSIS — R42 Dizziness and giddiness: Secondary | ICD-10-CM

## 2023-09-18 MED ORDER — MECLIZINE HCL 25 MG PO TABS
25.0000 mg | ORAL_TABLET | Freq: Three times a day (TID) | ORAL | 1 refills | Status: DC | PRN
  Filled 2023-09-18: qty 60, 20d supply, fill #0
  Filled 2023-11-07: qty 60, 20d supply, fill #1

## 2023-09-18 MED ORDER — IRON (FERROUS SULFATE) 325 (65 FE) MG PO TABS
325.0000 mg | ORAL_TABLET | Freq: Two times a day (BID) | ORAL | 3 refills | Status: AC
  Filled 2023-09-18: qty 60, 30d supply, fill #0
  Filled 2023-11-07: qty 60, 30d supply, fill #1
  Filled 2024-01-05: qty 60, 30d supply, fill #2

## 2023-09-18 NOTE — Telephone Encounter (Signed)
Requested medication (s) are due for refill today - expired Rx  Requested medication (s) are on the active medication list -yes  Future visit scheduled -yes  Last refill: 05/08/22 #60 1RF  Notes to clinic: non delegated Rx- patient forgot to request at appointment  Requested Prescriptions  Pending Prescriptions Disp Refills   meclizine (ANTIVERT) 25 MG tablet 60 tablet 1    Sig: Take 1 tablet (25 mg total) by mouth 3 (three) times daily as needed for dizziness.     Not Delegated - Gastroenterology: Antiemetics Failed - 09/18/2023  8:36 AM      Failed - This refill cannot be delegated      Passed - Valid encounter within last 6 months    Recent Outpatient Visits           Today DVT, lower extremity, proximal, acute, left (HCC)   [facility] - [facility] Dept Of [place]. [facility] Hoy Register, MD   4 months ago [place]re[place]ment   [facility] - [facility] Dept Of [place]. [facility] Hoy Register, MD   7 months ago Symptom of blood in stool   [facility] [place] - A Dept Of [place]. [facility] Marcine Matar, MD   10 months ago Annual visit for general adult medical examination with abnormal findings   [facility] - A Dept Of [place]. [facility] Hoy Register, MD   1 year ago Vaginal discharge   [facility] - [facility] Dept Of [place]. [facility] Hoy Register, MD       Future Appointments             In 2 months Hoy Register, MD [facility] - A Dept Of [place]. [facility]               Requested Prescriptions  Pending Prescriptions Disp Refills   meclizine (ANTIVERT) 25 MG tablet 60 tablet 1    Sig: Take 1 tablet (25 mg total) by mouth 3 (three) times daily as needed for dizziness.     Not Delegated - Gastroenterology: Antiemetics Failed - 09/18/2023  8:36 AM      Failed - This refill cannot be  delegated      Passed - Valid encounter within last 6 months    Recent Outpatient Visits           Today DVT, lower extremity, proximal, acute, left (HCC)   [facility] [place] [facility] - [facility] Dept Of [place]. [facility] Hoy Register, MD   4 months ago [place]ment   [facility] - A Dept Of [place]. [facility] Hoy Register, MD   7 months ago Symptom of blood in stool   [facility] [facility] [place] [place] - A Dept Of [place]. [facility] Marcine Matar, MD   10 months ago Annual visit for general adult medical examination with abnormal findings   [facility] [facility] - A Dept Of [place]. [facility] Hoy Register, MD   1 year ago Vaginal discharge   [facility] [place] - A Dept Of . [facility] Hoy Register, MD       Future Appointments             In 2 months Hoy Register, MD [facility]  [facility] - A Dept Of [place]. [facility]

## 2023-09-18 NOTE — Progress Notes (Signed)
Virtual Visit via Video Note  I connected with Barbara Dunn, on 09/18/2023 at 8:52 AM by video enabled telemedicine device and verified that I am speaking with the correct person using two identifiers.   Consent: I discussed the limitations, risks, security and privacy concerns of performing an evaluation and management service by telemedicine and the availability of in person appointments. I also discussed with the patient that there may be a patient responsible charge related to this service. The patient expressed understanding and agreed to proceed.   Location of Patient: Home  Location of Provider: Clinic   Persons participating in Telemedicine visit: Nayah Matteo Dr. Alvis Lemmings     History of Present Illness: Barbara Dunn is a 52 y.o. year old female  with  a history of hypertension, migraine, right peroneal DVT (in 01/2023 pleated treatment with Eliquis), newly diagnosed left lower extremity DVT involving left femoral, peroneal, popliteal, tibioperoneal trunk in 08/2023    She presents with right leg pain and intermittent dizziness. She was on Eliquis for 6 months after initial diagnosis of right lower extremity DVT in 01/2023, she has seen hematology and hypercoagulable panel was negative with mildly elevated D-dimer.  DVT was thought to be provoked due to OCP use.  Since most recent diagnosis at the ED she has since restarted Eliquis. Due to her abnormal uterine bleed she was placed on Micronor by GYN recently but since her diagnosis of DVT, she has been off birth control for a week, suspecting it may have contributed to the formation of blood clots. She has been feeling dizzy, which started recently and occurs without any specific trigger.  Last hemoglobin from a few days ago was 10.5.  The patient was planning to travel but has decided against it due to her current health concerns.  Past Medical History:  Diagnosis Date   Anemia    Arthritis    Asthma    Bronchitis    Chronic  kidney disease    DVT (deep venous thrombosis) (HCC)    Hypertension    Kidney stone    Migraines    Obesity    No Known Allergies  Current Outpatient Medications on File Prior to Visit  Medication Sig Dispense Refill   albuterol (VENTOLIN HFA) 108 (90 Base) MCG/ACT inhaler Inhale 1 puff into the lungs every 6 (six) hours as needed for wheezing and shortness of breath 6.7 g 1   amLODipine (NORVASC) 2.5 MG tablet Take 1 tablet (2.5 mg total) by mouth daily. 90 tablet 1   APIXABAN (ELIQUIS) VTE STARTER PACK (10MG  AND 5MG ) Take as directed on package: start with two-5mg  tablets twice daily for 7 days. On day 8, switch to one-5mg  tablet twice daily. 74 each 0   APPLE CIDER VINEGAR PO Take 1 capsule by mouth daily.     aspirin 81 MG tablet Take 81 mg by mouth daily.     BIOTIN PO Take 2 tablets by mouth daily.      cetirizine (ZYRTEC) 10 MG tablet Take 10 mg by mouth daily.     [employer] FIBER GUMMY BEARS CHILDREN PO Take by mouth.     docusate sodium (COLACE) 100 MG capsule Take 100 mg by mouth as needed.      ELDERBERRY PO Take by mouth.     fluticasone (FLONASE) 50 MCG/ACT nasal spray PLACE 2 SPRAYS INTO BOTH NOSTRILS DAILY. 16 g 2   fluticasone (FLOVENT HFA) 110 MCG/ACT inhaler INHALE 1 PUFF INTO THE LUNGS 2 (TWO) TIMES DAILY.  12 g 2   HYDROcodone-acetaminophen (NORCO/VICODIN) 5-325 MG tablet Take 2 tablets by mouth every 6 (six) hours as needed for moderate pain (pain score 4-6). 10 tablet 0   hydrOXYzine (ATARAX) 25 MG tablet Take 1 tablet (25 mg total) by mouth at bedtime as needed. (Patient not taking: Reported on 09/05/2023) 30 tablet 1   lisinopril (ZESTRIL) 20 MG tablet Take 1 tablet (20 mg total) by mouth daily. 90 tablet 1   meclizine (ANTIVERT) 25 MG tablet Take 1 tablet (25 mg total) by mouth 3 (three) times daily as needed for dizziness. 60 tablet 1   Multiple Vitamins-Minerals (MULTIVITAMIN ADULT PO) Take 1 tablet by mouth daily.      norethindrone (MICRONOR) 0.35 MG tablet Take 1  tablet (0.35 mg total) by mouth daily. 28 tablet 11   oxymetazoline (AFRIN NASAL SPRAY) 0.05 % nasal spray Place 1 spray into both nostrils 2 (two) times daily. 30 mL 0   SUMAtriptan (IMITREX) 50 MG tablet TAKE 2 TABLETS (100 MG TOTAL) BY MOUTH ONCE FOR 1 DOSE. MAY REPEAT IN 2 HOURS IF HEADACHE PERSISTS, MAX 4 TABLETS/DAY 10 tablet 6   topiramate (TOPAMAX) 50 MG tablet Take 1 tablet (50 mg total) by mouth 2 (two) times daily. 60 tablet 6   triamcinolone cream (KENALOG) 0.1 % APPLY 1 APPLICATION TOPICALLY 2 (TWO) TIMES DAILY. 30 g 2   vitamin C (ASCORBIC ACID) 250 MG tablet Take 250 mg by mouth daily.     [DISCONTINUED] diphenhydrAMINE (BENADRYL) 25 mg capsule Take 25 mg by mouth every 6 (six) hours as needed for itching. Reported on 01/23/2016     No current facility-administered medications on file prior to visit.    ROS: See HPI  Observations/Objective: Awake, alert, oriented x3 Not in acute distress Normal mood      Latest Ref Rng & Units 09/15/2023   11:28 PM 09/14/2023    3:34 PM 05/31/2023   10:34 AM  CMP  Glucose 70 - 99 mg/dL 96  454  82   BUN 6 - 20 mg/dL 15  9  11    Creatinine 0.44 - 1.00 mg/dL 0.98  1.19  1.47   Sodium 135 - 145 mmol/L 138  136  139   Potassium 3.5 - 5.1 mmol/L 3.6  3.4  3.5   Chloride 98 - 111 mmol/L 110  108  110   CO2 22 - 32 mmol/L 20  20  23    Calcium 8.9 - 10.3 mg/dL 8.5  8.5  8.7     Lipid Panel     Component Value Date/Time   CHOL 222 (H) 11/20/2022 0917   TRIG 63 11/20/2022 0917   HDL 123 11/20/2022 0917   CHOLHDL 2.1 11/02/2019 1009   CHOLHDL 1.8 07/27/2016 0917   VLDL 14 07/27/2016 0917   LDLCALC 88 11/20/2022 0917   LABVLDL 11 11/20/2022 0917    Lab Results  Component Value Date   HGBA1C 5.3 11/20/2022     Assessment and Plan:     Recurrent Deep Vein Thrombosis (DVT) Recent diagnosis of DVT in left leg, now with new right leg pain. Patient is currently on Eliquis 5mg  twice daily.  Previous history of right peroneal DVT 7  months ago.  Concern for possible new DVT in right leg.  Doppler performed a few days ago did not make mention of presence or absence of DVT in right leg but stated no right femoral vein occlusion. -Previous DVT was thought to be provoked by OCP use.  She is currently just on a progesterone only pill at the moment. -Hypercoagulable labs were negative performed by hematology -Order Doppler ultrasound of right leg. -Continue Eliquis 5mg  twice daily. -Keep appointment with DVT clinic on 09/30/2023.  Anemia Hemoglobin 10.1, patient reports recent dizziness. -History of menorrhagia secondary to fibroids contributing -Start Iron supplementation to address anemia. -Consider potential contribution of anemia to dizziness.  Vertigo Patient reports history of vertigo and recent dizziness. -Advise patient to take Meclizine as needed for vertigo symptoms.         Follow Up Instructions: Keep follow-up appointment with the DVT clinic   I discussed the assessment and treatment plan with the patient. The patient was provided an opportunity to ask questions and all were answered. The patient agreed with the plan and demonstrated an understanding of the instructions.   The patient was advised to call back or seek an in-person evaluation if the symptoms worsen or if the condition fails to improve as anticipated.     I provided 16 minutes total of Telehealth time during this encounter including median intraservice time, reviewing previous notes, investigations, ordering medications, medical decision making, coordinating care and patient verbalized understanding at the end of the visit.     Hoy Register, MD, FAAFP. [facility] [place], [place] [phone]   09/18/2023, 8:52 AM

## 2023-09-19 ENCOUNTER — Emergency Department (HOSPITAL_BASED_OUTPATIENT_CLINIC_OR_DEPARTMENT_OTHER): Payer: 59

## 2023-09-19 ENCOUNTER — Emergency Department (HOSPITAL_COMMUNITY): Payer: 59

## 2023-09-19 ENCOUNTER — Emergency Department (HOSPITAL_COMMUNITY)
Admission: EM | Admit: 2023-09-19 | Discharge: 2023-09-19 | Disposition: A | Discharge status: Home / Self Care | Payer: 59 | Attending: Emergency Medicine | Admitting: Emergency Medicine

## 2023-09-19 ENCOUNTER — Encounter (HOSPITAL_COMMUNITY): Payer: Self-pay | Admitting: Emergency Medicine

## 2023-09-19 DIAGNOSIS — M79604 Pain in right leg: Secondary | ICD-10-CM | POA: Diagnosis not present

## 2023-09-19 DIAGNOSIS — R079 Chest pain, unspecified: Secondary | ICD-10-CM | POA: Diagnosis not present

## 2023-09-19 DIAGNOSIS — M79661 Pain in right lower leg: Secondary | ICD-10-CM

## 2023-09-19 DIAGNOSIS — Z79899 Other long term (current) drug therapy: Secondary | ICD-10-CM | POA: Insufficient documentation

## 2023-09-19 DIAGNOSIS — I10 Essential (primary) hypertension: Secondary | ICD-10-CM | POA: Insufficient documentation

## 2023-09-19 DIAGNOSIS — G43909 Migraine, unspecified, not intractable, without status migrainosus: Secondary | ICD-10-CM | POA: Diagnosis not present

## 2023-09-19 DIAGNOSIS — R0789 Other chest pain: Secondary | ICD-10-CM | POA: Diagnosis not present

## 2023-09-19 DIAGNOSIS — R29818 Other symptoms and signs involving the nervous system: Secondary | ICD-10-CM | POA: Diagnosis not present

## 2023-09-19 DIAGNOSIS — Z7901 Long term (current) use of anticoagulants: Secondary | ICD-10-CM | POA: Diagnosis not present

## 2023-09-19 DIAGNOSIS — I2693 Single subsegmental pulmonary embolism without acute cor pulmonale: Secondary | ICD-10-CM | POA: Insufficient documentation

## 2023-09-19 DIAGNOSIS — Z7982 Long term (current) use of aspirin: Secondary | ICD-10-CM | POA: Diagnosis not present

## 2023-09-19 LAB — COMPREHENSIVE METABOLIC PANEL
ALT: 12 U/L (ref 0–44)
AST: 23 U/L (ref 15–41)
Albumin: 3.6 g/dL (ref 3.5–5.0)
Alkaline Phosphatase: 46 U/L (ref 38–126)
Anion gap: 8 (ref 5–15)
BUN: 11 mg/dL (ref 6–20)
CO2: 20 mmol/L — ABNORMAL LOW (ref 22–32)
Calcium: 8.7 mg/dL — ABNORMAL LOW (ref 8.9–10.3)
Chloride: 110 mmol/L (ref 98–111)
Creatinine, Ser: 0.97 mg/dL (ref 0.44–1.00)
GFR, Estimated: >60 mL/min (ref >60)
Glucose, Bld: 94 mg/dL (ref 70–99)
Potassium: 3.7 mmol/L (ref 3.5–5.1)
Sodium: 138 mmol/L (ref 135–145)
Total Bilirubin: 0.6 mg/dL (ref <1.2)
Total Protein: 7 g/dL (ref 6.5–8.1)

## 2023-09-19 LAB — CBC
HCT: 31.1 % — ABNORMAL LOW (ref 36.0–46.0)
Hemoglobin: 9.3 g/dL — ABNORMAL LOW (ref 12.0–15.0)
MCH: 24.9 pg — ABNORMAL LOW (ref 26.0–34.0)
MCHC: 29.9 g/dL — ABNORMAL LOW (ref 30.0–36.0)
MCV: 83.2 fL (ref 80.0–100.0)
Platelets: 270 10*3/uL (ref 150–400)
RBC: 3.74 MIL/uL — ABNORMAL LOW (ref 3.87–5.11)
RDW: 17.3 % — ABNORMAL HIGH (ref 11.5–15.5)
WBC: 6.2 10*3/uL (ref 4.0–10.5)
nRBC: 0 % (ref 0.0–0.2)

## 2023-09-19 LAB — PREGNANCY, URINE: Preg Test, Ur: NEGATIVE

## 2023-09-19 LAB — TROPONIN I (HIGH SENSITIVITY)
Troponin I (High Sensitivity): 4 ng/L (ref <18)
Troponin I (High Sensitivity): 5 ng/L (ref <18)

## 2023-09-19 MED ORDER — IOHEXOL 350 MG/ML SOLN
75.0000 mL | Freq: Once | INTRAVENOUS | Status: AC | PRN
  Administered 2023-09-19: 75 mL via INTRAVENOUS

## 2023-09-19 NOTE — ED Triage Notes (Signed)
Pt here from home with c/o right leg pain along with some chest pain , pt recently dx  with dvt in her left leg

## 2023-09-19 NOTE — ED Provider Notes (Signed)
[place] EMERGENCY DEPARTMENT AT [facility] Provider Note   CSN: [id number] Arrival date & time: 09/19/23  0920     History  Chief Complaint  Patient presents with   Leg Pain    Barbara Dunn is a 52 y.o. female.  52 year old female with a past medical history of HTN, migraine headaches, recently diagnosed left lower extremity DVT started on Eliquis presents here for concerns of right lower extremity pain, which began 2 days ago.  Since being restarted on her Eliquis, she has been compliant with this.  She is also endorsing some right-sided chest pain that began today.  She describes it as an intermittent stabbing sensation.  It is worse with inspiration.  Does not worsen with exertion.  No associated nausea, vomiting.  She does endorse shortness of breath and palpitations.  Denies any diaphoresis.  Additionally, patient reports that she has been lightheaded for the last 2 days.  She does have a history of vertigo, for which she takes meclizine.  However, her lightheadedness and dizziness has been constant since the onset 2 days ago.  This is atypical for her vertigo.  The history is provided by the patient and medical records.       Home Medications Prior to Admission medications   Medication Sig Start Date End Date Taking? Authorizing Provider  albuterol (VENTOLIN HFA) 108 (90 Base) MCG/ACT inhaler Inhale 1 puff into the lungs every 6 (six) hours as needed for wheezing and shortness of breath 07/18/22   Hoy Register, MD  amLODipine (NORVASC) 2.5 MG tablet Take 1 tablet (2.5 mg total) by mouth daily. 05/14/23   Hoy Register, MD  APIXABAN Everlene Balls) VTE STARTER PACK (10MG  AND 5MG ) Take as directed on package: start with two-5mg  tablets twice daily for 7 days. On day 8, switch to one-5mg  tablet twice daily. 09/14/23   Derwood Kaplan, MD  APPLE CIDER VINEGAR PO Take 1 capsule by mouth daily.    [provider]  aspirin 81 MG tablet Take 81 mg by mouth daily.     [provider]  BIOTIN PO Take 2 tablets by mouth daily.     [provider]  cetirizine (ZYRTEC) 10 MG tablet Take 10 mg by mouth daily.    [provider]  [employer] FIBER GUMMY BEARS CHILDREN PO Take by mouth.    [provider]  docusate sodium (COLACE) 100 MG capsule Take 100 mg by mouth as needed.     [provider]  ELDERBERRY PO Take by mouth.    [provider]  fluticasone (FLONASE) 50 MCG/ACT nasal spray PLACE 2 SPRAYS INTO BOTH NOSTRILS DAILY. 01/11/20   Hoy Register, MD  fluticasone (FLOVENT HFA) 110 MCG/ACT inhaler INHALE 1 PUFF INTO THE LUNGS 2 (TWO) TIMES DAILY. 07/18/22   Hoy Register, MD  HYDROcodone-acetaminophen (NORCO/VICODIN) 5-325 MG tablet Take 2 tablets by mouth every 6 (six) hours as needed for moderate pain (pain score 4-6). 09/16/23   Rancour, Jeannett Senior, MD  hydrOXYzine (ATARAX) 25 MG tablet Take 1 tablet (25 mg total) by mouth at bedtime as needed. Patient not taking: Reported on 09/05/2023 05/14/23   Hoy Register, MD  Iron, Ferrous Sulfate, 325 (65 Fe) MG TABS Take 325 mg by mouth 2 (two) times daily. 09/18/23   Hoy Register, MD  lisinopril (ZESTRIL) 20 MG tablet Take 1 tablet (20 mg total) by mouth daily. 05/14/23   Hoy Register, MD  meclizine (ANTIVERT) 25 MG tablet Take 1 tablet (25 mg total) by mouth  3 (three) times daily as needed for dizziness. 09/18/23   Hoy Register, MD  Multiple Vitamins-Minerals (MULTIVITAMIN ADULT PO) Take 1 tablet by mouth daily.     [provider]  norethindrone (MICRONOR) 0.35 MG tablet Take 1 tablet (0.35 mg total) by mouth daily. 09/05/23   Lorriane Shire, MD  oxymetazoline (AFRIN NASAL SPRAY) 0.05 % nasal spray Place 1 spray into both nostrils 2 (two) times daily. 10/31/21   Hoy Register, MD  SUMAtriptan (IMITREX) 50 MG tablet TAKE 2 TABLETS (100 MG TOTAL) BY MOUTH ONCE FOR 1 DOSE. MAY REPEAT IN 2 HOURS IF HEADACHE PERSISTS, MAX 4 TABLETS/DAY 11/12/22 11/12/23   Hoy Register, MD  topiramate (TOPAMAX) 50 MG tablet Take 1 tablet (50 mg total) by mouth 2 (two) times daily. 11/12/22   Hoy Register, MD  triamcinolone cream (KENALOG) 0.1 % APPLY 1 APPLICATION TOPICALLY 2 (TWO) TIMES DAILY. 11/20/22   Hoy Register, MD  vitamin C (ASCORBIC ACID) 250 MG tablet Take 250 mg by mouth daily.    [provider]  diphenhydrAMINE (BENADRYL) 25 mg capsule Take 25 mg by mouth every 6 (six) hours as needed for itching. Reported on 01/23/2016  12/07/19  [provider]      Allergies    Patient has no known allergies.    Review of Systems   As noted in HPI  Physical Exam Updated Vital Signs BP 136/82   Pulse 80   Temp 97.8 F (36.6 C)   Resp 18   LMP 08/16/2023 Comment: lasted four days  SpO2 100%  Physical Exam Vitals reviewed.  Constitutional:      General: She is not in acute distress.    Appearance: Normal appearance. She is not ill-appearing, toxic-appearing or diaphoretic.  Cardiovascular:     Rate and Rhythm: Normal rate and regular rhythm.     Pulses: Normal pulses.          Radial pulses are 2+ on the right side and 2+ on the left side.       Dorsalis pedis pulses are 2+ on the right side and 2+ on the left side.     Heart sounds: Normal heart sounds. No murmur heard.    No friction rub. No gallop.  Pulmonary:     Effort: Pulmonary effort is normal. No respiratory distress.     Breath sounds: Normal breath sounds. No wheezing, rhonchi or rales.  Abdominal:     General: There is no distension.     Palpations: Abdomen is soft.     Tenderness: There is no abdominal tenderness. There is no right CVA tenderness, left CVA tenderness, guarding or rebound.  Musculoskeletal:     Comments: Left lower extremity larger when compared to right.  Skin:    General: Skin is warm and dry.  Neurological:     Mental Status: She is alert.     GCS: GCS eye subscore is 4. GCS verbal subscore is 5. GCS motor subscore is 6.     Cranial  Nerves: Cranial nerves 2-12 are intact. No cranial nerve deficit, dysarthria or facial asymmetry.     Sensory: Sensation is intact. No sensory deficit.     Motor: Weakness (4/5 strength in right hip flexion, right plantarflexion, right dorsiflexion when compared to left) present. No tremor or pronator drift.     Coordination: Coordination is intact. Romberg sign negative. Finger-Nose-Finger Test and Heel to Alvord Test normal.     Gait: Gait is intact. Gait normal.  ED Results / Procedures / Treatments   Labs (all labs ordered are listed, but only abnormal results are displayed) Labs Reviewed  COMPREHENSIVE METABOLIC PANEL - Abnormal; Notable for the following components:      Result Value   CO2 20 (*)    Calcium 8.7 (*)    All other components within normal limits  CBC - Abnormal; Notable for the following components:   RBC 3.74 (*)    Hemoglobin 9.3 (*)    HCT 31.1 (*)    MCH 24.9 (*)    MCHC 29.9 (*)    RDW 17.3 (*)    All other components within normal limits  PREGNANCY, URINE  POC URINE PREG, ED  TROPONIN I (HIGH SENSITIVITY)  TROPONIN I (HIGH SENSITIVITY)    EKG None  Radiology VAS [place] LOWER EXTREMITY VENOUS (DVT) (ONLY [facility] & [facility])  Result Date: 09/19/2023  Lower Venous DVT Study Patient Name:  Mandy Camus  Date of Exam:   09/19/2023 Medical Rec #: [id number]    Accession #:    [id number] Date of Birth: 1970-11-07     Patient Gender: F Patient Age:   69 years Exam Location:  [facility] Procedure:      VAS [place] LOWER EXTREMITY VENOUS (DVT) Referring Phys: Benjiman Core --------------------------------------------------------------------------------  Indications: Pain.  Risk Factors: DVT 09/14/23 Left DVT distal femoral, popliteal, peroneal, tibioperoneal trunk. Anticoagulation: Eliquis. Comparison Study: No prior right LEV on file Performing Technologist: Sherren Kerns RVS  Examination Guidelines: A complete evaluation includes B-mode imaging, spectral Doppler, color  Doppler, and power Doppler as needed of all accessible portions of each vessel. Bilateral testing is considered an integral part of a complete examination. Limited examinations for reoccurring indications may be performed as noted. The reflux portion of the exam is performed with the patient in reverse Trendelenburg.  +---------+---------------+---------+-----------+----------+--------------+ RIGHT    CompressibilityPhasicitySpontaneityPropertiesThrombus Aging +---------+---------------+---------+-----------+----------+--------------+ CFV      Full           Yes      Yes                                 +---------+---------------+---------+-----------+----------+--------------+ SFJ      Full                                                        +---------+---------------+---------+-----------+----------+--------------+ FV Prox  Full                                                        +---------+---------------+---------+-----------+----------+--------------+ FV Mid   Full                                                        +---------+---------------+---------+-----------+----------+--------------+ FV DistalFull                                                        +---------+---------------+---------+-----------+----------+--------------+  PFV      Full                                                        +---------+---------------+---------+-----------+----------+--------------+ POP      Full           Yes      No                                  +---------+---------------+---------+-----------+----------+--------------+ PTV      Full                                                        +---------+---------------+---------+-----------+----------+--------------+ PERO     Full                                                        +---------+---------------+---------+-----------+----------+--------------+ Gastroc  Full                                                         +---------+---------------+---------+-----------+----------+--------------+   +----+---------------+---------+-----------+----------+--------------+ LEFTCompressibilityPhasicitySpontaneityPropertiesThrombus Aging +----+---------------+---------+-----------+----------+--------------+ CFV Full           Yes      Yes                                 +----+---------------+---------+-----------+----------+--------------+    Summary: RIGHT: - There is no evidence of deep vein thrombosis in the lower extremity.  - No cystic structure found in the popliteal fossa.  LEFT: - No evidence of common femoral vein obstruction.   *See table(s) above for measurements and observations. Electronically signed by Sherald Hess MD on 09/19/2023 at 1:46:57 PM.    Final    CT Angio Chest PE W and/or Wo Contrast  Result Date: 09/19/2023 CLINICAL DATA:  High probability for pulmonary embolism. Left lower extremity DVT. EXAM: CT ANGIOGRAPHY CHEST WITH CONTRAST TECHNIQUE: Multidetector CT imaging of the chest was performed using the standard protocol during bolus administration of intravenous contrast. Multiplanar CT image reconstructions and MIPs were obtained to evaluate the vascular anatomy. RADIATION DOSE REDUCTION: This exam was performed according to the departmental dose-optimization program which includes automated exposure control, adjustment of the mA and/or kV according to patient size and/or use of iterative reconstruction technique. CONTRAST:  75mL OMNIPAQUE IOHEXOL 350 MG/ML SOLN COMPARISON:  03/27/2022 FINDINGS: Cardiovascular: A single nonocclusive embolus is seen within a subsegmental pulmonary artery branch in the posterior right lower lobe (image 187/7), likely subacute in age. No other sites of pulmonary embolism are identified. No evidence of thoracic aortic dissection or aneurysm. Mediastinum/Nodes: No masses or pathologically enlarged lymph nodes identified.  Lungs/Pleura: No pulmonary mass, infiltrate, or effusion. Upper abdomen: No acute findings.  Musculoskeletal: No suspicious bone lesions identified. Review of the MIP images confirms the above findings. IMPRESSION: Single nonocclusive embolus within a subsegmental pulmonary artery branch in the posterior right lower lobe, likely subacute in age. No other sites of pulmonary embolism identified. Critical Value/emergent results were called by telephone at the time of interpretation on 09/19/2023 at 12:23 pm to provider [facility] , who verbally acknowledged these results. Electronically Signed   By: Danae Orleans M.D.   On: 09/19/2023 12:23   MR BRAIN WO CONTRAST  Result Date: 09/19/2023 CLINICAL DATA:  Neuro deficit, acute, stroke suspected. Migraine headaches. Hypertension. Bilateral DVT. EXAM: MRI HEAD WITHOUT CONTRAST TECHNIQUE: Multiplanar, multiecho pulse sequences of the brain and surrounding structures were obtained without intravenous contrast. COMPARISON:  MR head without and with contrast 03/26/2016 FINDINGS: Brain: No acute infarct, hemorrhage, or mass lesion is present. 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 Ventricles are otherwise normal in size. No significant extraaxial fluid collection is present. Deep brain nuclei are within normal limits. The brainstem and cerebellum are within normal limits. The internal auditory canals are within normal limits. Midline structures are within normal limits. Vascular: Flow is present in the major intracranial arteries. Skull and upper cervical spine: The craniocervical junction is normal. Upper cervical spine is within normal limits. Marrow signal is unremarkable. Sinuses/Orbits: The paranasal sinuses and mastoid air cells are clear. The globes and orbits are within normal limits. IMPRESSION: 1. No acute intracranial abnormality or significant interval change. 2. Stable right CP angle  subarachnoid cyst. Electronically Signed   By: Marin Roberts M.D.   On: 09/19/2023 11:23    Procedures Procedures    Medications Ordered in ED Medications  iohexol (OMNIPAQUE) 350 MG/ML injection 75 mL (75 mLs Intravenous Contrast Given 09/19/23 1205)    ED Course/ Medical Decision Making/ A&P                                 Medical Decision Making Amount and/or Complexity of Data Reviewed Labs: ordered. Radiology: ordered and independent interpretation performed. ECG/medicine tests: ordered and independent interpretation performed.  Risk Prescription drug management.   52 year old female presents here for concerns of right lower extremity pain for the last several days in the setting of recently diagnosed left lower extremity DVT.  She was recently restarted on Eliquis.  Vitals are reassuring on presentation here.  On exam, she is not in any acute distress.  She has 2+ DP pulses bilaterally.  Sensation is intact to bilateral lower extremities.  She does also endorse some dizziness and lightheadedness for the last 2 days.  She has new right lower extremity weakness on my exam.  Patient is concerned with this finding as she states her right leg is typically her stronger leg.  Initial differential diagnosis includes CVA, PE, DVT, electrolyte abnormality, anemia.  Patient is currently in a hypercoagulable state after recently being restarted on birth control.  She has now stopped this.  However, given her new dizziness and neurologic deficit of right lower extremity weakness, I do feel she would benefit from MRI imaging.  Will also obtain CTA chest to evaluate for PE in the setting of pleuritic chest pain.  Patient's new right leg pain raises the concern for additional DVT in the right lower extremity.  Therefore, will obtain ultrasound of right lower extremity.  Will obtain  basic lab screening with CMP, CBC.  Will also evaluate for ACS with serial troponins and ECG.  Independently  reviewed the patient's ECG, which demonstrates sinus rhythm.  Rate of 65.  Normal intervals.  No axis deviation.  T wave inversion in lead III.  No ST segment changes.  Nonischemic ECG.  When compared to prior ECG from July 2023, no changes noted.  I independently reviewed the patient's imaging.  On CTA, no large thrombus or saddle embolus on my review.  MRI brain without contrast without evidence of acute stroke.  Radiology interpretation of CTA notable for subsegmental PE in the right lower lobe, which is likely the etiology of patient's right-sided chest pain.  This can be safely managed with continuing patient's Eliquis.  No findings consistent with CVA found on MRI.  Ultrasound of the right lower extremity was negative for DVT.  Patient's laboratory workup here is reassuring.  ECG is nonischemic.  Serial troponins are within normal limits and stable.  CMP without clinically significant findings.  CBC notable for slightly decreased hemoglobin (10.1-> 9.3 today).  However, patient is on her menstrual cycle, which is likely the cause.  Patient does not meet criteria for hospital mission at this time.  She is felt to be appropriate for discharge.  Patient was instructed to continue her Eliquis.  She has follow-up in DVT clinic coming up.  She was encouraged to keep this appointment.  Patient discharged in stable condition.  Patient's presentation is most consistent with acute presentation with potential threat to life or bodily function.         Final Clinical Impression(s) / ED Diagnoses Final diagnoses:  Chest pain, unspecified type  Right leg pain  Single subsegmental pulmonary embolism without acute cor pulmonale [facility])    Rx / DC Orders ED Discharge Orders     None         Rolla Flatten, MD 09/19/23 1757    Benjiman Core, MD 09/20/23 1443

## 2023-09-19 NOTE — Discharge Instructions (Signed)
You had an MRI done to look for any signs of stroke.  We do not see any abnormalities.  Your ultrasound of your right leg.  We do not see any blood clots on this.  You had a CT scan of your chest, which did show a small blood clot in your right lung.  You should continue taking your Eliquis as prescribed.  Continue to wear compression stockings for your legs to help with swelling and pain.  Please return to the ER if you have worsening chest pain, shortness of breath, worsening pain in your legs.

## 2023-09-19 NOTE — Progress Notes (Signed)
VASCULAR LAB    Right lower extremity venous duplex has been performed.  See CV proc for preliminary results.  Messaged negative results to Dr. Rubin Payor and Dr. Monica Martinez via secure chat.  Shevaun Lovan, RVT 09/19/2023, 12:28 PM

## 2023-09-30 ENCOUNTER — Encounter (HOSPITAL_COMMUNITY): Payer: Self-pay | Admitting: Student-PharmD

## 2023-09-30 ENCOUNTER — Telehealth: Payer: Self-pay

## 2023-09-30 ENCOUNTER — Other Ambulatory Visit (HOSPITAL_COMMUNITY): Payer: Self-pay

## 2023-09-30 ENCOUNTER — Ambulatory Visit (HOSPITAL_COMMUNITY)
Admission: RE | Admit: 2023-09-30 | Discharge: 2023-09-30 | Disposition: A | Discharge status: Home / Self Care | Payer: 59 | Source: Ambulatory Visit | Attending: Vascular Surgery | Admitting: Vascular Surgery

## 2023-09-30 VITALS — BP 119/86 | HR 75

## 2023-09-30 DIAGNOSIS — I2693 Single subsegmental pulmonary embolism without acute cor pulmonale: Secondary | ICD-10-CM | POA: Insufficient documentation

## 2023-09-30 DIAGNOSIS — I82412 Acute embolism and thrombosis of left femoral vein: Secondary | ICD-10-CM | POA: Insufficient documentation

## 2023-09-30 MED ORDER — APIXABAN 5 MG PO TABS
5.0000 mg | ORAL_TABLET | Freq: Two times a day (BID) | ORAL | 5 refills | Status: DC
Start: 2023-09-30 — End: 2024-04-13
  Filled 2023-09-30: qty 60, 30d supply, fill #0
  Filled 2023-11-07: qty 60, 30d supply, fill #1
  Filled 2023-12-07: qty 60, 30d supply, fill #2
  Filled 2024-01-05: qty 60, 30d supply, fill #3
  Filled 2024-02-02: qty 60, 30d supply, fill #4
  Filled 2024-03-08: qty 60, 30d supply, fill #5

## 2023-09-30 NOTE — Patient Instructions (Signed)
-  Continue apixaban (Eliquis) 5 mg twice daily. -Your refills have been sent to [facility]. You may need to call the pharmacy to ask them to fill this when you start to run low on your current supply.  -It is important to take your medication around the same time every day.  -Avoid NSAIDs like ibuprofen (Advil, Motrin) and naproxen (Aleve) as well as aspirin doses over 100 mg daily. -Tylenol (acetaminophen) is the preferred over the counter pain medication to lower the risk of bleeding. -Be sure to alert all of your health care providers that you are taking an anticoagulant prior to starting a new medication or having a procedure. -Monitor for signs and symptoms of bleeding (abnormal bruising, prolonged bleeding, nose bleeds, bleeding from gums, discolored urine, black tarry stools). If you have fallen and hit your head OR if your bleeding is severe or not stopping, seek emergency care.  -Go to the emergency room if emergent signs and symptoms of new clot occur (new or worse swelling and pain in an arm or leg, shortness of breath, chest pain, fast or irregular heartbeats, lightheadedness, dizziness, fainting, coughing up blood) or if you experience a significant color change (pale or blue) in the extremity that has the DVT.  -We recommend you wear compression stockings (20-30 mmHg) as long as you are having swelling or pain. Be sure to purchase the correct size and take them off at night.   Your next visit is on January 29th at 1:30pm.  [facility] [address] [place], [place], [place] [address] Enter the hospital through Entrance [address] off [facility] and pull up to the [facility] entrance to the free valet parking.  Check in for your appointment at the [facility].   If you have any questions or need to reschedule an appointment, please call [phone] [facility].  If you are having an emergency, call 911 or present to the nearest  emergency room.   What is a DVT?  -Deep vein thrombosis (DVT) is a condition in which a blood clot forms in a vein of the deep venous system which can occur in the lower leg, thigh, pelvis, arm, or neck. This condition is serious and can be life-threatening if the clot travels to the arteries of the lungs and causing a blockage (pulmonary embolism, PE). A DVT can also damage veins in the leg, which can lead to long-term venous disease, leg pain, swelling, discoloration, and ulcers or sores (post-thrombotic syndrome).  -Treatment may include taking an anticoagulant medication to prevent more clots from forming and the current clot from growing, wearing compression stockings, and/or surgical procedures to remove or dissolve the clot.

## 2023-09-30 NOTE — Progress Notes (Addendum)
DVT Clinic Note  Name: Barbara Dunn     MRN: [id number]     DOB: 1971-02-08     Sex: female  PCP: Hoy Register, MD  Today's Visit: Visit Information: Initial Visit  Referred to DVT Clinic by: Emergency Department - Dr. Durwin Nora, Dr. Manus Gunning Referred to CPP by: Dr. Karin Lieu Reason for referral:  Chief Complaint  Patient presents with   Med Management - DVT   HISTORY OF PRESENT ILLNESS: Barbara Dunn is a 52 y.o. female with PMH DVT, HTN, migraine without aura, asthma, heavy menstrual bleeding, who presents after diagnosis of DVT and PE for medication management. Patient was diagnosed with first DVT 01/2023 involving the right peroneal vein and was seen in DVT Clinic at that time. She was on OCPs which she stopped 03/2023. She was evaluated by hematology at that time who found no hypercoagulable cause of DVT and since OCPs were discontinued, Eliquis was discontinued after 3 months. Of note, patient had heavy menstrual bleeding at baseline, which is what she was taking OCPs for and was worsened when taking Eliquis and after OCP discontinuation. She was seen 09/05/23 by OB/GYN and started on trial of progestin-only pill (norethindrone) for bleeding management.   She presented to the ED Saturday 09/14/23 with left leg pain for 3 days which was 1 week after starting POP, found to have acute DVT involving the left femoral, popliteal, peroneal, and tibioperoneal trunk. She was restarted on the Eliquis starter pack and referred to DVT Clinic for follow up. She returned to the ED Sunday 09/15/23 reporting worsening LLE pain. CT venogram showed no extension into the common femoral vein. The ED provider discussed with Dr. Karin Lieu who recommended no vascular surgery intervention and to continue anticoagulation, compression, elevation, and DVT Clinic follow up. She had a virtual visit with her PCP Dr. Alvis Lemmings on 09/18/23 where she reported RLE pain and dizziness. RLE doppler was ordered, but patient presented to the ED  09/19/23 with chest pain, SOB, and palpitations that began that day. RLE doppler showed no DVT. CTA showed nonocclusive embolus within the subsegmental pulmonary anterior branch in the posterior right lower lobe, likely subacute in age. Eliquis was continued and she was told to keep her DVT Clinic follow up.   Today, patient reports mild pain in her left leg today. She is still having some chest pain and SOB that comes and goes. Patient reports she stopped taking her new birth control. Has continued to have uterine bleeding. Otherwise, denies abnormal bleeding or bruising. Denies missed doses of Eliquis. Endorses wearing thigh-high compression stockings and is wearing them today. Has been elevating as well.   Positive Thrombotic Risk Factors: Previous VTE, Obesity, Other (comment) (started norethindrone 1 week prior to sympton onset) Bleeding Risk Factors: Anticoagulant therapy, Anemia, Other (comment) (heavy menstrual bleeding, uterine fibroids)  Negative Thrombotic Risk Factors: Recent surgery (within 3 months), Recent trauma (within 3 months), Recent admission to hospital with acute illness (within 3 months), Paralysis, paresis, or recent plaster cast immobilization of lower extremity, Central venous catheterization, Bed rest >72 hours within 3 months, Sedentary journey lasting >8 hours within 4 weeks, Pregnancy, Recent cesarean section (within 3 months), Within 6 weeks postpartum, Estrogen therapy, Testosterone therapy, Erythropoiesis-stimulating agent, Recent COVID diagnosis (within 3 months), Active cancer, Non-malignant, chronic inflammatory condition, Known thrombophilic condition, Smoking, Older age  Rx Insurance Coverage: Commercial Rx Affordability: Eliquis is $15/month on her insurance. She still has the copay card activated at her prior visits with me to reduce cost to $  10/month.  Rx Assistance Provided: Co-pay card Preferred Pharmacy: [facility]  Past Medical History:   Diagnosis Date   Anemia    Arthritis    Asthma    Bronchitis    Chronic kidney disease    DVT (deep venous thrombosis) (HCC)    Hypertension    Kidney stone    Migraines    Obesity     Past Surgical History:  Procedure Laterality Date   CARDIAC CATHETERIZATION N/A 08/02/2015   Procedure: Left Heart Cath and Coronary Angiography;  Surgeon: Lennette Bihari, MD;  Location: [facility];  Service: Cardiovascular;  Laterality: N/A;   CERVIX LESION DESTRUCTION     TUBAL LIGATION     TUBAL LIGATION      Social History   Socioeconomic History   Marital status: Single    Spouse name: Not on file   Number of children: Not on file   Years of education: Not on file   Highest education level: Not on file  Occupational History   Not on file  Tobacco Use   Smoking status: Former    Current packs/day: 0.00    Types: Cigarettes    Quit date: 12/15/2003    Years since quitting: 19.8   Smokeless tobacco: Never   Tobacco comments:    quit 10 years ago  Vaping Use   Vaping status: Never Used  Substance and Sexual Activity   Alcohol use: Yes    Alcohol/week: 2.0 standard drinks of alcohol    Types: 1 Glasses of wine, 1 Shots of liquor per week    Comment: Once every 3 months    Drug use: No   Sexual activity: Yes    Birth control/protection: Other-see comments    Comment: tubal  Other Topics Concern   Not on file  Social History Narrative   Pt has GED. CNA as well.    Social Determinants of Health   Financial Resource Strain: Not on file  Food Insecurity: Food Insecurity Present (09/05/2023)   Hunger Vital Sign    Worried About Running Out of Food in the Last Year: Sometimes true    Ran Out of Food in the Last Year: Sometimes true  Transportation Needs: No Transportation Needs (09/05/2023)   PRAPARE - Administrator, Civil Service (Medical): No    Lack of Transportation (Non-Medical): No  Physical Activity: Not on file  Stress: Not on file  Social  Connections: Not on file  Intimate Partner Violence: Not on file    Family History  Problem Relation Age of Onset   Hypertension Mother    Schizophrenia Mother    Schizophrenia Sister    Schizophrenia Brother    Schizophrenia Brother    Colon cancer Neg Hx    Esophageal cancer Neg Hx    Colon polyps Neg Hx    Rectal cancer Neg Hx    Stomach cancer Neg Hx     Allergies as of 09/30/2023   (No Known Allergies)    Current Outpatient Medications on File Prior to Encounter  Medication Sig Dispense Refill   albuterol (VENTOLIN HFA) 108 (90 Base) MCG/ACT inhaler Inhale 1 puff into the lungs every 6 (six) hours as needed for wheezing and shortness of breath 6.7 g 1   amLODipine (NORVASC) 2.5 MG tablet Take 1 tablet (2.5 mg total) by mouth daily. 90 tablet 1   APIXABAN (ELIQUIS) VTE STARTER PACK (10MG  AND 5MG ) Take as directed on package: start with  two-5mg  tablets twice daily for 7 days. On day 8, switch to one-5mg  tablet twice daily. 74 each 0   APPLE CIDER VINEGAR PO Take 1 capsule by mouth daily.     BIOTIN PO Take 2 tablets by mouth daily.      cetirizine (ZYRTEC) 10 MG tablet Take 10 mg by mouth daily.     [employer] FIBER GUMMY BEARS CHILDREN PO Take by mouth. Also contains B12, vitamin C, and vitamin D     docusate sodium (COLACE) 100 MG capsule Take 100 mg by mouth as needed.      ELDERBERRY PO Take by mouth.     fluticasone (FLONASE) 50 MCG/ACT nasal spray PLACE 2 SPRAYS INTO BOTH NOSTRILS DAILY. 16 g 2   fluticasone (FLOVENT HFA) 110 MCG/ACT inhaler INHALE 1 PUFF INTO THE LUNGS 2 (TWO) TIMES DAILY. 12 g 2   Iron, Ferrous Sulfate, 325 (65 Fe) MG TABS Take 325 mg by mouth 2 (two) times daily. 60 tablet 3   lisinopril (ZESTRIL) 20 MG tablet Take 1 tablet (20 mg total) by mouth daily. 90 tablet 1   Magnesium 250 MG TABS Take 250 mg by mouth daily.     meclizine (ANTIVERT) 25 MG tablet Take 1 tablet (25 mg total) by mouth 3 (three) times daily as needed for dizziness. 60 tablet 1   Multiple  Vitamins-Minerals (MULTIVITAMIN ADULT PO) Take 1 tablet by mouth daily.      oxymetazoline (AFRIN NASAL SPRAY) 0.05 % nasal spray Place 1 spray into both nostrils 2 (two) times daily. 30 mL 0   SUMAtriptan (IMITREX) 50 MG tablet TAKE 2 TABLETS (100 MG TOTAL) BY MOUTH ONCE FOR 1 DOSE. MAY REPEAT IN 2 HOURS IF HEADACHE PERSISTS, MAX 4 TABLETS/DAY 10 tablet 6   topiramate (TOPAMAX) 50 MG tablet Take 1 tablet (50 mg total) by mouth 2 (two) times daily. 60 tablet 6   triamcinolone cream (KENALOG) 0.1 % APPLY 1 APPLICATION TOPICALLY 2 (TWO) TIMES DAILY. 30 g 2   vitamin C (ASCORBIC ACID) 250 MG tablet Take 250 mg by mouth daily.     hydrOXYzine (ATARAX) 25 MG tablet Take 1 tablet (25 mg total) by mouth at bedtime as needed. (Patient not taking: Reported on 09/05/2023) 30 tablet 1   norethindrone (MICRONOR) 0.35 MG tablet Take 1 tablet (0.35 mg total) by mouth daily. (Patient not taking: Reported on 09/30/2023) 28 tablet 11   [DISCONTINUED] diphenhydrAMINE (BENADRYL) 25 mg capsule Take 25 mg by mouth every 6 (six) hours as needed for itching. Reported on 01/23/2016     No current facility-administered medications on file prior to encounter.   REVIEW OF SYSTEMS:  Review of Systems  Respiratory:  Positive for shortness of breath.   Cardiovascular:  Positive for chest pain and leg swelling. Negative for palpitations.  Musculoskeletal:  Positive for myalgias.  Neurological:  Positive for dizziness and tingling.   PHYSICAL EXAMINATION:  Vitals:   09/30/23 1406  BP: 119/86  Pulse: 75  SpO2: 100%   Physical Exam Vitals reviewed.  Cardiovascular:     Rate and Rhythm: Normal rate.  Pulmonary:     Effort: Pulmonary effort is normal.  Musculoskeletal:        General: Tenderness present.     Right lower leg: No edema.     Left lower leg: Edema present.  Skin:    Findings: No bruising or erythema.  Psychiatric:        Mood and Affect: Mood normal.  Behavior: Behavior normal.        Thought  Content: Thought content normal.   Villalta Score for Post-Thrombotic Syndrome: Pain: Mild Cramps: Mild Heaviness: Mild Paresthesia: Mild Pruritus: Absent Pretibial Edema: Moderate Skin Induration: Absent Hyperpigmentation: Absent Redness: Absent Venous Ectasia: Absent Pain on calf compression: Mild Villalta Preliminary Score: 7 Is venous ulcer present?: No If venous ulcer is present and score is <15, then 15 points total are assigned: Absent Villalta Total Score: 7  LABS:  CBC     Component Value Date/Time   WBC 6.2 09/19/2023 1015   RBC 3.74 (L) 09/19/2023 1015   HGB 9.3 (L) 09/19/2023 1015   HGB 10.5 (L) 05/31/2023 1034   HGB 11.6 02/15/2023 0944   HCT 31.1 (L) 09/19/2023 1015   HCT 36.9 02/15/2023 0944   PLT 270 09/19/2023 1015   PLT 244 05/31/2023 1034   PLT 265 02/15/2023 0944   MCV 83.2 09/19/2023 1015   MCV 90 02/15/2023 0944   MCH 24.9 (L) 09/19/2023 1015   MCHC 29.9 (L) 09/19/2023 1015   RDW 17.3 (H) 09/19/2023 1015   RDW 12.1 02/15/2023 0944   LYMPHSABS 3.4 09/15/2023 2328   LYMPHSABS 2.1 11/20/2022 0917   MONOABS 0.7 09/15/2023 2328   EOSABS 0.2 09/15/2023 2328   EOSABS 0.1 11/20/2022 0917   BASOSABS 0.1 09/15/2023 2328   BASOSABS 0.1 11/20/2022 0917    Hepatic Function      Component Value Date/Time   PROT 7.0 09/19/2023 1015   PROT 7.1 11/20/2022 0917   ALBUMIN 3.6 09/19/2023 1015   ALBUMIN 4.3 11/20/2022 0917   AST 23 09/19/2023 1015   ALT 12 09/19/2023 1015   ALKPHOS 46 09/19/2023 1015   BILITOT 0.6 09/19/2023 1015   BILITOT 0.4 11/20/2022 0917    Renal Function   Lab Results  Component Value Date   CREATININE 0.97 09/19/2023   CREATININE 0.90 09/15/2023   CREATININE 0.91 09/14/2023    CrCl cannot be calculated (Unknown ideal weight.).   Imaging studies reviewed:  02/01/23 VAS [place] LOWER EXTREMITY VENOUS (DVT) RIGHT  Summary:  RIGHT:  - Findings consistent with acute deep vein thrombosis involving the right  peroneal veins.  - No  cystic structure found in the popliteal fossa.    LEFT:  - No evidence of common femoral vein obstruction.   09/14/23 VAS [place] LOWER EXTREMITY VENOUS (DVT) LEFT Summary:  RIGHT:  - No evidence of common femoral vein obstruction.   LEFT:  - Findings consistent with acute deep vein thrombosis involving the left femoral vein, left peroneal veins, left popliteal vein, and tibioperoneal trunk.  - No cystic structure found in the popliteal fossa.   09/19/23 VAS [place] LOWER EXTREMITY VENOUS (DVT) RIGHT Summary:  RIGHT:  - There is no evidence of deep vein thrombosis in the lower extremity.  - No cystic structure found in the popliteal fossa.    LEFT:  - No evidence of common femoral vein obstruction.   09/16/23 CT venogram IMPRESSION: 1. No evidence of deep venous thrombosis or inguinal hernia. 2. Small fat containing umbilical hernia. 3. Right renal calculus. 4. Uterine fibroid.  09/19/23 CTA (PE) IMPRESSION: Single nonocclusive embolus within a subsegmental pulmonary artery branch in the posterior right lower lobe, likely subacute in age.   No other sites of pulmonary embolism identified.  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 She was restarted on Eliquis and Dr. Karin Lieu was consulted in the ED. Patient is not a candidate for thrombectomy, and he recommended continuing anticoagulation, compression and elevation.   She is tolerating Eliquis well thus far  but does have recurrence of worsening uterine bleeding since stopping birth control and resuming Eliquis. She will need to see her gynecologist to further discuss treatment options for this. She recently started taking an iron supplement. Uterine fibroid seen on CT in the ED. No issues with medication access or affordability. Provided her with refills of Eliquis today. Will help her get reconnected to hematology in light of recurrent VTE for recommendations on duration of treatment.   PLAN: -Continue apixaban (Eliquis) 5 mg twice daily. -Expected duration of therapy: per hematology. Therapy started on 09/14/23. -Patient educated on purpose, proper use and potential adverse effects of apixaban (Eliquis). -Discussed importance of taking medication around the same time every day. -Advised patient of medications to avoid (NSAIDs, aspirin doses >100 mg daily). -Educated that Tylenol (acetaminophen) is the preferred analgesic to lower the risk of bleeding. -Advised patient to alert all providers of anticoagulation therapy prior to starting a new medication or having a procedure. -Emphasized importance of monitoring for signs and symptoms of bleeding (abnormal bruising, prolonged bleeding, nose bleeds, bleeding from gums, discolored urine, black tarry stools). -Educated patient to present to the ED if emergent signs and symptoms of new thrombosis occur. -Counseled patient to wear compression stockings daily, removing at night. Elevate legs to help improve swelling.   Follow up: Referral to hematology placed. Also needs follow up with gynecology. DVT Clinic follow up in January.   Pervis Hocking, PharmD, [place], CPP Deep Vein Thrombosis Clinic Clinical Pharmacist Practitioner Office: [phone]  I have evaluated the patient's chart/imaging and refer this patient to the Clinical Pharmacist Practitioner for medication management. I have reviewed the CPP's documentation and agree with her assessment and  plan. I was immediately available during the visit for questions and collaboration.  Discussed with Pervis Hocking. Asymptomatic on today's exam.   Victorino Sparrow, MD

## 2023-09-30 NOTE — Telephone Encounter (Signed)
-----   Message from Omer sent at 09/30/2023  3:35 PM EST ----- Regarding: ultrasound Please help re-schedule pelvic US.  Called pt and pt informed me that she was never scheduled an ultrasound and that she was already told that she has fibroids.  I explained to the pt that Dr. Briscoe Deutscher would like for her to have an updated U/S and then the results will discussed at her upcoming appt on 11/07/23.   Pt agreed to U/S appt on 10/07/23 at 1400 at [facility].  Pt verbalized understanding to all that was discussed and did not have any other questions.   Leonette Nutting

## 2023-09-30 NOTE — Telephone Encounter (Incomplete Revision)
-----   Message from Cecil sent at 09/30/2023  3:35 PM EST ----- Regarding: ultrasound Please help re-schedule pelvic US.  Called pt and pt informed me

## 2023-10-04 DIAGNOSIS — N281 Cyst of kidney, acquired: Secondary | ICD-10-CM | POA: Diagnosis not present

## 2023-10-04 DIAGNOSIS — D1771 Benign lipomatous neoplasm of kidney: Secondary | ICD-10-CM | POA: Diagnosis not present

## 2023-10-04 DIAGNOSIS — N200 Calculus of kidney: Secondary | ICD-10-CM | POA: Diagnosis not present

## 2023-10-07 ENCOUNTER — Ambulatory Visit (HOSPITAL_COMMUNITY)
Admission: RE | Admit: 2023-10-07 | Discharge: 2023-10-07 | Disposition: A | Discharge status: Home / Self Care | Payer: 59 | Source: Ambulatory Visit | Attending: Obstetrics and Gynecology | Admitting: Obstetrics and Gynecology

## 2023-10-07 DIAGNOSIS — N939 Abnormal uterine and vaginal bleeding, unspecified: Secondary | ICD-10-CM | POA: Insufficient documentation

## 2023-10-15 ENCOUNTER — Other Ambulatory Visit (HOSPITAL_COMMUNITY): Payer: Self-pay

## 2023-11-06 NOTE — Progress Notes (Deleted)
GYNECOLOGY  VISIT   HPI: Barbara Dunn is a 53 y.o.   Single  {Race/ethnicity:17218}  female   No obstetric history on file. here for ***      GYNECOLOGIC HISTORY: No LMP recorded. Contraception: {contraception:315051}.   Menopausal hormone therapy:  *** Last mammogram:  *** Last pap smear:  Diagnosis  Date Value Ref Range Status  11/20/2022   Final   - Negative for Intraepithelial Lesions or Malignancy (NILM)  11/20/2022 - Benign reactive/reparative changes  Final           OB History   No obstetric history on file.        Patient Active Problem List   Diagnosis Date Noted   Acute deep vein thrombosis (DVT) of femoral vein of left lower extremity (HCC) 09/30/2023   Single subsegmental pulmonary embolism without acute cor pulmonale (HCC) 09/30/2023   MGUS (monoclonal gammopathy of unknown significance) 06/25/2023   Chronic anticoagulation 03/29/2023   Anemia 03/28/2023   Primary hypercoagulable state (HCC) 03/24/2023   Acute deep vein thrombosis (DVT) of right peroneal vein (HCC) 02/01/2023   Pulmonary nodule 01/24/2022   Asthma 01/24/2022   Paroxysmal SVT (supraventricular tachycardia) (HCC) 04/21/2020   Personal history of COVID-19 04/21/2020   Seasonal allergies 01/23/2016   Oral contraceptive use 10/19/2015   Non-cardiac chest pain    Palpitations    Abnormal nuclear stress test    Dizziness and giddiness 05/30/2015   Chest pain 05/30/2015   Knee contusion 05/04/2015   Vaginal discharge 02/12/2015   Abnormal uterine bleeding 02/11/2015   Right brachial plexitis 09/03/2014   Shortness of breath 04/20/2014   Pain in left knee 04/20/2014   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   Candidal intertrigo 11/09/2013   Health care maintenance 11/09/2013   Hair loss 09/15/2013   Pain in right shoulder 08/18/2013   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   Hypertension 07/13/2012   Leg pain 03/27/2012   Migraine without aura 01/17/2007   OBESITY, NOS 12/19/2006    Past  Medical History:  Diagnosis Date   Anemia    Arthritis    Asthma    Bronchitis    Chronic kidney disease    DVT (deep venous thrombosis) (HCC)    Hypertension    Kidney stone    Migraines    Obesity     Past Surgical History:  Procedure Laterality Date   CARDIAC CATHETERIZATION N/A 08/02/2015   Procedure: Left Heart Cath and Coronary Angiography;  Surgeon: Lennette Bihari, MD;  Location: [facility];  Service: Cardiovascular;  Laterality: N/A;   CERVIX LESION DESTRUCTION     TUBAL LIGATION     TUBAL LIGATION      Current Outpatient Medications  Medication Sig Dispense Refill   albuterol (VENTOLIN HFA) 108 (90 Base) MCG/ACT inhaler Inhale 1 puff into the lungs every 6 (six) hours as needed for wheezing and shortness of breath 6.7 g 1   amLODipine (NORVASC) 2.5 MG tablet Take 1 tablet (2.5 mg total) by mouth daily. 90 tablet 1   apixaban (ELIQUIS) 5 MG TABS tablet Take 1 tablet (5 mg total) by mouth 2 (two) times daily. Start taking after completion of starter pack. 60 tablet 5   APIXABAN (ELIQUIS) VTE STARTER PACK (10MG  AND 5MG ) Take as directed on package: start with two-5mg  tablets twice daily for 7 days. On day 8, switch to one-5mg  tablet twice daily. 74 each 0   APPLE CIDER VINEGAR PO Take 1 capsule by mouth daily.  BIOTIN PO Take 2 tablets by mouth daily.      cetirizine (ZYRTEC) 10 MG tablet Take 10 mg by mouth daily.     [employer] FIBER GUMMY BEARS CHILDREN PO Take by mouth. Also contains B12, vitamin C, and vitamin D     docusate sodium (COLACE) 100 MG capsule Take 100 mg by mouth as needed.      ELDERBERRY PO Take by mouth.     fluticasone (FLONASE) 50 MCG/ACT nasal spray PLACE 2 SPRAYS INTO BOTH NOSTRILS DAILY. 16 g 2   fluticasone (FLOVENT HFA) 110 MCG/ACT inhaler INHALE 1 PUFF INTO THE LUNGS 2 (TWO) TIMES DAILY. 12 g 2   hydrOXYzine (ATARAX) 25 MG tablet Take 1 tablet (25 mg total) by mouth at bedtime as needed. (Patient not taking: Reported on 09/05/2023) 30 tablet  1   Iron, Ferrous Sulfate, 325 (65 Fe) MG TABS Take 325 mg by mouth 2 (two) times daily. 60 tablet 3   lisinopril (ZESTRIL) 20 MG tablet Take 1 tablet (20 mg total) by mouth daily. 90 tablet 1   Magnesium 250 MG TABS Take 250 mg by mouth daily.     meclizine (ANTIVERT) 25 MG tablet Take 1 tablet (25 mg total) by mouth 3 (three) times daily as needed for dizziness. 60 tablet 1   Multiple Vitamins-Minerals (MULTIVITAMIN ADULT PO) Take 1 tablet by mouth daily.      norethindrone (MICRONOR) 0.35 MG tablet Take 1 tablet (0.35 mg total) by mouth daily. (Patient not taking: Reported on 09/30/2023) 28 tablet 11   oxymetazoline (AFRIN NASAL SPRAY) 0.05 % nasal spray Place 1 spray into both nostrils 2 (two) times daily. 30 mL 0   SUMAtriptan (IMITREX) 50 MG tablet TAKE 2 TABLETS (100 MG TOTAL) BY MOUTH ONCE FOR 1 DOSE. MAY REPEAT IN 2 HOURS IF HEADACHE PERSISTS, MAX 4 TABLETS/DAY 10 tablet 6   topiramate (TOPAMAX) 50 MG tablet Take 1 tablet (50 mg total) by mouth 2 (two) times daily. 60 tablet 6   triamcinolone cream (KENALOG) 0.1 % APPLY 1 APPLICATION TOPICALLY 2 (TWO) TIMES DAILY. 30 g 2   vitamin C (ASCORBIC ACID) 250 MG tablet Take 250 mg by mouth daily.     No current facility-administered medications for this visit.     ALLERGIES: Patient has no known allergies.  Family History  Problem Relation Age of Onset   Hypertension Mother    Schizophrenia Mother    Schizophrenia Sister    Schizophrenia Brother    Schizophrenia Brother    Colon cancer Neg Hx    Esophageal cancer Neg Hx    Colon polyps Neg Hx    Rectal cancer Neg Hx    Stomach cancer Neg Hx     Social History   Socioeconomic History   Marital status: Single    Spouse name: Not on file   Number of children: Not on file   Years of education: Not on file   Highest education level: Not on file  Occupational History   Not on file  Tobacco Use   Smoking status: Former    Current packs/day: 0.00    Types: Cigarettes    Quit  date: 12/15/2003    Years since quitting: 19.9   Smokeless tobacco: Never   Tobacco comments:    quit 10 years ago  Vaping Use   Vaping status: Never Used  Substance and Sexual Activity   Alcohol use: Yes    Alcohol/week: 2.0 standard drinks of alcohol    Types:  1 Glasses of wine, 1 Shots of liquor per week    Comment: Once every 3 months    Drug use: No   Sexual activity: Yes    Birth control/protection: Other-see comments    Comment: tubal  Other Topics Concern   Not on file  Social History Narrative   Pt has GED. CNA as well.    Social Drivers of Corporate investment banker Strain: Not on file  Food Insecurity: Food Insecurity Present (09/05/2023)   Hunger Vital Sign    Worried About Running Out of Food in the Last Year: Sometimes true    Ran Out of Food in the Last Year: Sometimes true  Transportation Needs: No Transportation Needs (09/05/2023)   PRAPARE - Administrator, Civil Service (Medical): No    Lack of Transportation (Non-Medical): No  Physical Activity: Not on file  Stress: Not on file  Social Connections: Not on file  Intimate Partner Violence: Not on file    Review of Systems  PHYSICAL EXAMINATION:    There were no vitals taken for this visit.    General appearance: alert, cooperative and appears stated age Head: Normocephalic, without obvious abnormality, atraumatic Neck: no adenopathy, supple, symmetrical, trachea midline and thyroid normal to inspection and palpation Lungs: clear to auscultation bilaterally Breasts: normal appearance, no masses or tenderness, No nipple retraction or dimpling, No nipple discharge or bleeding, No axillary or supraclavicular adenopathy Heart: regular rate and rhythm Abdomen: soft, non-tender, no masses,  no organomegaly Extremities: extremities normal, atraumatic, no cyanosis or edema Skin: Skin color, texture, turgor normal. No rashes or lesions Lymph nodes: Cervical, supraclavicular, and axillary nodes  normal. No abnormal inguinal nodes palpated Neurologic: Grossly normal  Pelvic: External genitalia:  no lesions              Urethra:  normal appearing urethra with no masses, tenderness or lesions              Bartholins and Skenes: normal                 Vagina: normal appearing vagina with normal color and discharge, no lesions              Cervix: no lesions                Bimanual Exam:  Uterus:  normal size, contour, position, consistency, mobility, non-tender              Adnexa: no mass, fullness, tenderness              Rectal exam: {yes no:314532}.  Confirms.              Anus:  normal sphincter tone, no lesions  Chaperone was present for exam  ASSESSMENT & PLAN   Barbara Dunn is a 53 yo F with history notable for VTE (2024) on Eliquis, uterine fibroid, and normocytic anemia presenting for f/u of AUB s/p two-month trial Micronor 0.35 mg for control of vaginal bleeding.   #AUB***   #Anemia*** 11/24 Hgb 9.5 8/24 Ferritin 7 Compliant with ferrous sulfate.     An After Visit Summary was printed and given to the patient.   Ralene Muskrat, [place] 1/15/20257:27 PM

## 2023-11-07 ENCOUNTER — Ambulatory Visit: Payer: 59 | Admitting: Obstetrics and Gynecology

## 2023-11-07 ENCOUNTER — Other Ambulatory Visit (HOSPITAL_COMMUNITY)
Admission: RE | Admit: 2023-11-07 | Discharge: 2023-11-07 | Disposition: A | Discharge status: Home / Self Care | Payer: 59 | Source: Ambulatory Visit | Attending: Obstetrics and Gynecology | Admitting: Obstetrics and Gynecology

## 2023-11-07 ENCOUNTER — Other Ambulatory Visit: Payer: Self-pay

## 2023-11-07 ENCOUNTER — Encounter: Payer: Self-pay | Admitting: Obstetrics and Gynecology

## 2023-11-07 VITALS — BP 128/84 | HR 79 | Wt 194.7 lb

## 2023-11-07 DIAGNOSIS — Z658 Other specified problems related to psychosocial circumstances: Secondary | ICD-10-CM

## 2023-11-07 DIAGNOSIS — N939 Abnormal uterine and vaginal bleeding, unspecified: Secondary | ICD-10-CM | POA: Diagnosis not present

## 2023-11-07 DIAGNOSIS — N760 Acute vaginitis: Secondary | ICD-10-CM | POA: Insufficient documentation

## 2023-11-07 DIAGNOSIS — Z1331 Encounter for screening for depression: Secondary | ICD-10-CM | POA: Diagnosis not present

## 2023-11-07 NOTE — Patient Instructions (Signed)
[email or web address]
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
## 2023-11-08 ENCOUNTER — Other Ambulatory Visit (HOSPITAL_COMMUNITY): Payer: Self-pay

## 2023-11-08 ENCOUNTER — Encounter: Payer: Self-pay | Admitting: Obstetrics and Gynecology

## 2023-11-08 ENCOUNTER — Other Ambulatory Visit: Payer: Self-pay

## 2023-11-08 LAB — CERVICOVAGINAL ANCILLARY ONLY
Bacterial Vaginitis (gardnerella): NEGATIVE
Candida Glabrata: NEGATIVE
Candida Vaginitis: NEGATIVE
Chlamydia: NEGATIVE
Comment: NEGATIVE
Comment: NEGATIVE
Comment: NEGATIVE
Comment: NEGATIVE
Comment: NEGATIVE
Comment: NORMAL
Neisseria Gonorrhea: NEGATIVE
Trichomonas: NEGATIVE

## 2023-11-08 LAB — ESTRADIOL: Estradiol: 460 pg/mL

## 2023-11-08 LAB — FOLLICLE STIMULATING HORMONE: FSH: 4.1 m[IU]/mL

## 2023-11-10 ENCOUNTER — Other Ambulatory Visit: Payer: Self-pay | Admitting: Hematology and Oncology

## 2023-11-10 DIAGNOSIS — N939 Abnormal uterine and vaginal bleeding, unspecified: Secondary | ICD-10-CM

## 2023-11-10 DIAGNOSIS — D472 Monoclonal gammopathy: Secondary | ICD-10-CM

## 2023-11-10 DIAGNOSIS — I82451 Acute embolism and thrombosis of right peroneal vein: Secondary | ICD-10-CM

## 2023-11-10 NOTE — Progress Notes (Unsigned)
[facility] [phone]   [phone]  PROGRESS NOTE  Patient Care Team: Hoy Register, MD as PCP - General (Family Medicine) Jodelle Red, MD as PCP - Cardiology (Cardiology) Linna Darner, RD as Dietitian [facility] Medicine)  Hematological/Oncological History # IgG Kappa MGUS # Iron Deficiency Anemia 2/2 to GYN Bleeding #  Lower Extremity DVTs 05/31/2023: last visit with Dr. Angelene Giovanni 11/11/2023: establish care with Dr. Leonides Schanz   Interval History:  Barbara Dunn 53 y.o. female with medical history significant for MGUS, IDA, and prior DVT who presents for a follow up visit. The patient's last visit was on 05/31/2023. In the interim since the last visit she has had no major changes in her health.  On exam today Barbara Dunn reports that her first blood clot was secondary to birth control, estrogen based.  She reports that her subsequent blood clot in the most recent 1 was unprovoked in nature.  She reports she is having discomfort in her right arm today and would like to have an ultrasound in order to rule out recurrent DVT.  She is taking her Eliquis 5 mg twice daily faithfully with no missed dosages.  She reports in terms of her iron deficiency she has always had low iron.  She reports the medication does not cause any constipation, or stomach upset as she takes stool softeners with it.  She reports her menstrual bleeding is "terrible".  She notes that she does follow with OB/GYN to try to get this under better control.  She reports her energy levels are about a 5 out of 10.  Overall her appetite levels are poor but recently she has been eating a lot.  She reports that otherwise she is not having any trouble with fevers, chills, sweats, nausea, vomiting or diarrhea.  A full 10 point ROS is otherwise negative.  MEDICAL HISTORY:  Past Medical History:  Diagnosis Date   Anemia    Arthritis    Asthma    Bronchitis    Chronic kidney disease    DVT (deep  venous thrombosis) (HCC)    Hypertension    Kidney stone    Migraines    Obesity     SURGICAL HISTORY: Past Surgical History:  Procedure Laterality Date   CARDIAC CATHETERIZATION N/A 08/02/2015   Procedure: Left Heart Cath and Coronary Angiography;  Surgeon: Lennette Bihari, MD;  Location: [facility] INVASIVE CV LAB;  Service: Cardiovascular;  Laterality: N/A;   CERVIX LESION DESTRUCTION     TUBAL LIGATION     TUBAL LIGATION      SOCIAL HISTORY: Social History   Socioeconomic History   Marital status: Single    Spouse name: Not on file   Number of children: Not on file   Years of education: Not on file   Highest education level: Not on file  Occupational History   Not on file  Tobacco Use   Smoking status: Former    Current packs/day: 0.00    Types: Cigarettes    Quit date: 12/15/2003    Years since quitting: 19.9   Smokeless tobacco: Never   Tobacco comments:    quit 10 years ago  Vaping Use   Vaping status: Never Used  Substance and Sexual Activity   Alcohol use: Yes    Alcohol/week: 2.0 standard drinks of alcohol    Types: 1 Glasses of wine, 1 Shots of liquor per week    Comment: Once every 3 months    Drug use: No  Sexual activity: Yes    Birth control/protection: Other-see comments    Comment: tubal  Other Topics Concern   Not on file  Social History Narrative   Pt has GED. CNA as well.    Social Drivers of Corporate investment banker Strain: Not on file  Food Insecurity: Food Insecurity Present (11/07/2023)   Hunger Vital Sign    Worried About Running Out of Food in the Last Year: Sometimes true    Ran Out of Food in the Last Year: Sometimes true  Transportation Needs: No Transportation Needs (11/07/2023)   PRAPARE - Administrator, Civil Service (Medical): No    Lack of Transportation (Non-Medical): No  Physical Activity: Not on file  Stress: Not on file  Social Connections: Not on file  Intimate Partner Violence: Not on file    FAMILY  HISTORY: Family History  Problem Relation Age of Onset   Hypertension Mother    Schizophrenia Mother    Schizophrenia Sister    Schizophrenia Brother    Schizophrenia Brother    Colon cancer Neg Hx    Esophageal cancer Neg Hx    Colon polyps Neg Hx    Rectal cancer Neg Hx    Stomach cancer Neg Hx     ALLERGIES:  has no known allergies.  MEDICATIONS:  Current Outpatient Medications  Medication Sig Dispense Refill   albuterol (VENTOLIN HFA) 108 (90 Base) MCG/ACT inhaler Inhale 1 puff into the lungs every 6 (six) hours as needed for wheezing and shortness of breath 6.7 g 1   amLODipine (NORVASC) 2.5 MG tablet Take 1 tablet (2.5 mg total) by mouth daily. 90 tablet 1   apixaban (ELIQUIS) 5 MG TABS tablet Take 1 tablet (5 mg total) by mouth 2 (two) times daily. Start taking after completion of starter pack. 60 tablet 5   APPLE CIDER VINEGAR PO Take 1 capsule by mouth daily.     BIOTIN PO Take 2 tablets by mouth daily.      cetirizine (ZYRTEC) 10 MG tablet Take 10 mg by mouth daily.     [employer] FIBER GUMMY BEARS CHILDREN PO Take by mouth. Also contains B12, vitamin C, and vitamin D     docusate sodium (COLACE) 100 MG capsule Take 100 mg by mouth as needed.      ELDERBERRY PO Take by mouth.     fluticasone (FLONASE) 50 MCG/ACT nasal spray PLACE 2 SPRAYS INTO BOTH NOSTRILS DAILY. 16 g 2   fluticasone (FLOVENT HFA) 110 MCG/ACT inhaler INHALE 1 PUFF INTO THE LUNGS 2 (TWO) TIMES DAILY. 12 g 2   hydrOXYzine (ATARAX) 25 MG tablet Take 1 tablet (25 mg total) by mouth at bedtime as needed. (Patient not taking: Reported on 09/05/2023) 30 tablet 1   Iron, Ferrous Sulfate, 325 (65 Fe) MG TABS Take 325 mg by mouth 2 (two) times daily. 60 tablet 3   lisinopril (ZESTRIL) 20 MG tablet Take 1 tablet (20 mg total) by mouth daily. 90 tablet 1   Magnesium 250 MG TABS Take 250 mg by mouth daily.     meclizine (ANTIVERT) 25 MG tablet Take 1 tablet (25 mg total) by mouth 3 (three) times daily as needed for  dizziness. 60 tablet 1   Multiple Vitamins-Minerals (MULTIVITAMIN ADULT PO) Take 1 tablet by mouth daily.      norethindrone (MICRONOR) 0.35 MG tablet Take 1 tablet (0.35 mg total) by mouth daily. (Patient not taking: Reported on 09/30/2023) 28 tablet 11   oxymetazoline (  AFRIN NASAL SPRAY) 0.05 % nasal spray Place 1 spray into both nostrils 2 (two) times daily. 30 mL 0   SUMAtriptan (IMITREX) 50 MG tablet TAKE 2 TABLETS (100 MG TOTAL) BY MOUTH ONCE FOR 1 DOSE. MAY REPEAT IN 2 HOURS IF HEADACHE PERSISTS, MAX 4 TABLETS/DAY 10 tablet 6   topiramate (TOPAMAX) 50 MG tablet Take 1 tablet (50 mg total) by mouth 2 (two) times daily. 60 tablet 6   triamcinolone cream (KENALOG) 0.1 % APPLY 1 APPLICATION TOPICALLY 2 (TWO) TIMES DAILY. 30 g 2   vitamin C (ASCORBIC ACID) 250 MG tablet Take 250 mg by mouth daily.     No current facility-administered medications for this visit.    REVIEW OF SYSTEMS:   Constitutional: ( - ) fevers, ( - )  chills , ( - ) night sweats Eyes: ( - ) blurriness of vision, ( - ) double vision, ( - ) watery eyes Ears, nose, mouth, throat, and face: ( - ) mucositis, ( - ) sore throat Respiratory: ( - ) cough, ( - ) dyspnea, ( - ) wheezes Cardiovascular: ( - ) palpitation, ( - ) chest discomfort, ( - ) lower extremity swelling Gastrointestinal:  ( - ) nausea, ( - ) heartburn, ( - ) change in bowel habits Skin: ( - ) abnormal skin rashes Lymphatics: ( - ) new lymphadenopathy, ( - ) easy bruising Neurological: ( - ) numbness, ( - ) tingling, ( - ) new weaknesses Behavioral/Psych: ( - ) mood change, ( - ) new changes  All other systems were reviewed with the patient and are negative.  PHYSICAL EXAMINATION: Vitals:   11/11/23 1335  BP: (!) 131/90  Pulse: 71  Resp: 14  Temp: 98.1 F (36.7 C)  SpO2: 100%   Filed Weights   11/11/23 1335  Weight: 194 lb 4.8 oz (88.1 kg)    GENERAL: Well-appearing middle-age African-American female, alert, no distress and comfortable SKIN: skin  color, texture, turgor are normal, no rashes or significant lesions EYES: conjunctiva are pink and non-injected, sclera clear LUNGS: clear to auscultation and percussion with normal breathing effort HEART: regular rate & rhythm and no murmurs and no lower extremity edema Musculoskeletal: no cyanosis of digits and no clubbing  PSYCH: alert & oriented x 3, fluent speech NEURO: no focal motor/sensory deficits  LABORATORY DATA:  I have reviewed the data as listed    Latest Ref Rng & Units 11/11/2023    2:13 PM 09/19/2023   10:15 AM 09/15/2023   11:28 PM  CBC  WBC 4.0 - 10.5 K/uL 7.2  6.2  7.6   Hemoglobin 12.0 - 15.0 g/dL 74.2  9.3  59.5   Hematocrit 36.0 - 46.0 % 35.4  31.1  32.6   Platelets 150 - 400 K/uL 257  270  270        Latest Ref Rng & Units 11/11/2023    2:13 PM 09/19/2023   10:15 AM 09/15/2023   11:28 PM  CMP  Glucose 70 - 99 mg/dL 86  94  96   BUN 6 - 20 mg/dL 13  11  15    Creatinine 0.44 - 1.00 mg/dL 6.38  7.56  4.33   Sodium 135 - 145 mmol/L 139  138  138   Potassium 3.5 - 5.1 mmol/L 3.6  3.7  3.6   Chloride 98 - 111 mmol/L 108  110  110   CO2 22 - 32 mmol/L 26  20  20    Calcium 8.9 -  10.3 mg/dL 9.0  8.7  8.5   Total Protein 6.5 - 8.1 g/dL 6.8  7.0    Total Bilirubin 0.0 - 1.2 mg/dL 0.4  0.6    Alkaline Phos 38 - 126 U/L 48  46    AST 15 - 41 U/L 20  23    ALT 0 - 44 U/L 14  12      Lab Results  Component Value Date   MPROTEIN 0.7 (H) 03/29/2023   Lab Results  Component Value Date   KPAFRELGTCHN 37.6 (H) 11/11/2023   KPAFRELGTCHN 32.0 (H) 03/29/2023   LAMBDASER 12.6 11/11/2023   LAMBDASER 10.1 03/29/2023   KAPLAMBRATIO 2.98 (H) 11/11/2023   KAPLAMBRATIO 3.17 (H) 03/29/2023    RADIOGRAPHIC STUDIES: I have personally reviewed the radiological images as listed and agreed with the findings in the report. VAS [place] UPPER EXTREMITY VENOUS DUPLEX Result Date: 11/11/2023 UPPER VENOUS STUDY  Patient Name:  Barbara Dunn  Date of Exam:   11/11/2023 Medical Rec #:  [id number]    Accession #:    [id number] Date of Birth: 1970-12-05     Patient Gender: F Patient Age:   36 years Exam Location:  [facility] Procedure:      VAS [place] UPPER EXTREMITY VENOUS DUPLEX Referring Phys: Jonny Ruiz Prather Failla --------------------------------------------------------------------------------  Indications: Pain, and Right arm pain, shoulder pain radiating to elbow Risk Factors: Hx PE DVT Hx DVT LLE. Performing Technologist: Criss Rosales RVT  Examination Guidelines: A complete evaluation includes B-mode imaging, spectral Doppler, color Doppler, and power Doppler as needed of all accessible portions of each vessel. Bilateral testing is considered an integral part of a complete examination. Limited examinations for reoccurring indications may be performed as noted.  Right Findings: +----------+------------+---------+-----------+----------+-------+ RIGHT     CompressiblePhasicitySpontaneousPropertiesSummary +----------+------------+---------+-----------+----------+-------+ IJV           Full       Yes       Yes                      +----------+------------+---------+-----------+----------+-------+ Subclavian    Full       Yes       Yes                      +----------+------------+---------+-----------+----------+-------+ Axillary      Full       Yes       Yes                      +----------+------------+---------+-----------+----------+-------+ Brachial      Full       Yes                                +----------+------------+---------+-----------+----------+-------+ Radial        Full                                          +----------+------------+---------+-----------+----------+-------+ Ulnar         Full                                          +----------+------------+---------+-----------+----------+-------+ Cephalic      Full                                           +----------+------------+---------+-----------+----------+-------+  Basilic       Full                                          +----------+------------+---------+-----------+----------+-------+  Left Findings: +----+------------+---------+-----------+----------+-------+ LEFTCompressiblePhasicitySpontaneousPropertiesSummary +----+------------+---------+-----------+----------+-------+ IJV     Full       Yes       Yes                      +----+------------+---------+-----------+----------+-------+  Summary:  Right: No evidence of deep vein thrombosis in the upper extremity.  No evidence of superficial vein thrombosis in the upper extremity.  *See table(s) above for measurements and observations.  Diagnosing physician: Coral Else MD Electronically signed by Coral Else MD on 11/11/2023 at 4:17:21 PM.    Final     ASSESSMENT & PLAN Barbara Dunn 53 y.o. female with medical history significant for MGUS, IDA, and prior DVT who presents for a follow up visit.   # IgG Kappa MGUS -- Labs today show a kappa 37.6, lambda 12.6, with a ratio of 2.98.  Previous SPEP showed an M protein of 0.7 with an IgG kappa specificity. -- Repeat SPEP and serum free light chains as well as CBC, CMP, LDH every 6 months  # Iron Deficiency Anemia 2/2 to GYN Bleeding --Continue ferrous sulfate 325 mg p.o. daily -- Consider IV iron therapy if patient is unable to adequately replete iron levels with p.o. iron therapy. -- Labs today show white blood cell 7.2, hemoglobin 11.2, MCV 88.7, platelets 257 -- Ferritin is 18 with iron sat of 41% -- Return to clinic in 3 months time for reevaluation  # Lower Extremity DVTs -- Continue Eliquis 5 mg twice daily. -- Will order ultrasound of right upper extremity given pain and patient's concern for recurrent DVT.  No orders of the defined types were placed in this encounter.   All questions were answered. The patient knows to call the clinic with any problems,  questions or concerns.  A total of more than 30 minutes were spent on this encounter with face-to-face time and non-face-to-face time, including preparing to see the patient, ordering tests and/or medications, counseling the patient and coordination of care as outlined above.   Ulysees Barns, MD Department of Hematology/Oncology [facility] at [facility] Phone: [phone] Pager: [phone] Email: [email or web address]  11/12/2023 3:38 PM

## 2023-11-11 ENCOUNTER — Inpatient Hospital Stay: Payer: 59

## 2023-11-11 ENCOUNTER — Other Ambulatory Visit: Payer: Self-pay

## 2023-11-11 ENCOUNTER — Other Ambulatory Visit: Payer: Self-pay | Admitting: *Deleted

## 2023-11-11 ENCOUNTER — Telehealth: Payer: Self-pay | Admitting: *Deleted

## 2023-11-11 ENCOUNTER — Ambulatory Visit (INDEPENDENT_AMBULATORY_CARE_PROVIDER_SITE_OTHER)
Admission: RE | Admit: 2023-11-11 | Discharge: 2023-11-11 | Disposition: A | Discharge status: Home / Self Care | Payer: 59 | Source: Ambulatory Visit | Attending: Surgery | Admitting: Surgery

## 2023-11-11 ENCOUNTER — Inpatient Hospital Stay: Payer: 59 | Attending: Hematology and Oncology | Admitting: Hematology and Oncology

## 2023-11-11 VITALS — BP 131/90 | HR 71 | Temp 98.1°F | Resp 14 | Wt 194.3 lb

## 2023-11-11 DIAGNOSIS — D500 Iron deficiency anemia secondary to blood loss (chronic): Secondary | ICD-10-CM

## 2023-11-11 DIAGNOSIS — M79602 Pain in left arm: Secondary | ICD-10-CM | POA: Insufficient documentation

## 2023-11-11 DIAGNOSIS — I82451 Acute embolism and thrombosis of right peroneal vein: Secondary | ICD-10-CM | POA: Insufficient documentation

## 2023-11-11 DIAGNOSIS — Z7901 Long term (current) use of anticoagulants: Secondary | ICD-10-CM | POA: Insufficient documentation

## 2023-11-11 DIAGNOSIS — D509 Iron deficiency anemia, unspecified: Secondary | ICD-10-CM | POA: Insufficient documentation

## 2023-11-11 DIAGNOSIS — D472 Monoclonal gammopathy: Secondary | ICD-10-CM

## 2023-11-11 DIAGNOSIS — I82409 Acute embolism and thrombosis of unspecified deep veins of unspecified lower extremity: Secondary | ICD-10-CM | POA: Insufficient documentation

## 2023-11-11 DIAGNOSIS — N920 Excessive and frequent menstruation with regular cycle: Secondary | ICD-10-CM | POA: Diagnosis not present

## 2023-11-11 DIAGNOSIS — N939 Abnormal uterine and vaginal bleeding, unspecified: Secondary | ICD-10-CM

## 2023-11-11 LAB — CBC WITH DIFFERENTIAL (CANCER CENTER ONLY)
Abs Immature Granulocytes: 0.02 10*3/uL (ref 0.00–0.07)
Basophils Absolute: 0.1 10*3/uL (ref 0.0–0.1)
Basophils Relative: 1 %
Eosinophils Absolute: 0.2 10*3/uL (ref 0.0–0.5)
Eosinophils Relative: 2 %
HCT: 35.4 % — ABNORMAL LOW (ref 36.0–46.0)
Hemoglobin: 11.2 g/dL — ABNORMAL LOW (ref 12.0–15.0)
Immature Granulocytes: 0 %
Lymphocytes Relative: 38 %
Lymphs Abs: 2.7 10*3/uL (ref 0.7–4.0)
MCH: 28.1 pg (ref 26.0–34.0)
MCHC: 31.6 g/dL (ref 30.0–36.0)
MCV: 88.7 fL (ref 80.0–100.0)
Monocytes Absolute: 0.7 10*3/uL (ref 0.1–1.0)
Monocytes Relative: 10 %
Neutro Abs: 3.5 10*3/uL (ref 1.7–7.7)
Neutrophils Relative %: 49 %
Platelet Count: 257 10*3/uL (ref 150–400)
RBC: 3.99 MIL/uL (ref 3.87–5.11)
RDW: 17.2 % — ABNORMAL HIGH (ref 11.5–15.5)
WBC Count: 7.2 10*3/uL (ref 4.0–10.5)
nRBC: 0 % (ref 0.0–0.2)

## 2023-11-11 LAB — CMP (CANCER CENTER ONLY)
ALT: 14 U/L (ref 0–44)
AST: 20 U/L (ref 15–41)
Albumin: 3.9 g/dL (ref 3.5–5.0)
Alkaline Phosphatase: 48 U/L (ref 38–126)
Anion gap: 5 (ref 5–15)
BUN: 13 mg/dL (ref 6–20)
CO2: 26 mmol/L (ref 22–32)
Calcium: 9 mg/dL (ref 8.9–10.3)
Chloride: 108 mmol/L (ref 98–111)
Creatinine: 1.01 mg/dL — ABNORMAL HIGH (ref 0.44–1.00)
GFR, Estimated: >60 mL/min (ref >60)
Glucose, Bld: 86 mg/dL (ref 70–99)
Potassium: 3.6 mmol/L (ref 3.5–5.1)
Sodium: 139 mmol/L (ref 135–145)
Total Bilirubin: 0.4 mg/dL (ref 0.0–1.2)
Total Protein: 6.8 g/dL (ref 6.5–8.1)

## 2023-11-11 LAB — RETIC PANEL
Immature Retic Fract: 7.8 % (ref 2.3–15.9)
RBC.: 3.98 MIL/uL (ref 3.87–5.11)
Retic Count, Absolute: 41 10*3/uL (ref 19.0–186.0)
Retic Ct Pct: 1 % (ref 0.4–3.1)
Reticulocyte Hemoglobin: 34.4 pg (ref >27.9)

## 2023-11-11 LAB — FERRITIN: Ferritin: 18 ng/mL (ref 11–307)

## 2023-11-11 LAB — IRON AND IRON BINDING CAPACITY (CC-WL,HP ONLY)
Iron: 128 ug/dL (ref 28–170)
Saturation Ratios: 41 % — ABNORMAL HIGH (ref 10.4–31.8)
TIBC: 309 ug/dL (ref 250–450)
UIBC: 181 ug/dL (ref 148–442)
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
## 2023-11-11 NOTE — Telephone Encounter (Signed)
TCT patient regarding Vascular U/S of her right arm. Spoke with her. Advised that the [place] did not show a blood clot in her right arm. Advised to follow up with her PCP regarding her persistent arm pain. Reviewed some of her lab results that have returned. So far her labs are looking better as far as iron and HGB. Advised that her MGUS labs will be back in 7-10 days.  Pt voiced understanding.

## 2023-11-12 LAB — KAPPA/LAMBDA LIGHT CHAINS
Kappa free light chain: 37.6 mg/L — ABNORMAL HIGH (ref 3.3–19.4)
Kappa, lambda light chain ratio: 2.98 — ABNORMAL HIGH (ref 0.26–1.65)
Lambda free light chains: 12.6 mg/L (ref 5.7–26.3)

## 2023-11-17 LAB — MULTIPLE MYELOMA PANEL, SERUM
Albumin SerPl Elph-Mcnc: 3.4 g/dL (ref 2.9–4.4)
Albumin/Glob SerPl: 1.2 (ref 0.7–1.7)
Alpha 1: 0.2 g/dL (ref 0.0–0.4)
Alpha2 Glob SerPl Elph-Mcnc: 0.7 g/dL (ref 0.4–1.0)
B-Globulin SerPl Elph-Mcnc: 1 g/dL (ref 0.7–1.3)
Gamma Glob SerPl Elph-Mcnc: 1 g/dL (ref 0.4–1.8)
Globulin, Total: 3 g/dL (ref 2.2–3.9)
IgA: 281 mg/dL (ref 87–352)
IgG (Immunoglobin G), Serum: 1182 mg/dL (ref 586–1602)
IgM (Immunoglobulin M), Srm: 30 mg/dL (ref 26–217)
M Protein SerPl Elph-Mcnc: 0.5 g/dL — ABNORMAL HIGH
Total Protein ELP: 6.4 g/dL (ref 6.0–8.5)

## 2023-11-18 ENCOUNTER — Ambulatory Visit: Payer: Self-pay | Admitting: Clinical

## 2023-11-18 ENCOUNTER — Other Ambulatory Visit (HOSPITAL_COMMUNITY): Payer: Self-pay

## 2023-11-18 ENCOUNTER — Other Ambulatory Visit: Payer: Self-pay | Admitting: Family Medicine

## 2023-11-18 DIAGNOSIS — Z658 Other specified problems related to psychosocial circumstances: Secondary | ICD-10-CM

## 2023-11-18 DIAGNOSIS — I10 Essential (primary) hypertension: Secondary | ICD-10-CM

## 2023-11-18 DIAGNOSIS — F4321 Adjustment disorder with depressed mood: Secondary | ICD-10-CM

## 2023-11-18 MED ORDER — LISINOPRIL 20 MG PO TABS
20.0000 mg | ORAL_TABLET | Freq: Every day | ORAL | 0 refills | Status: DC
  Filled 2023-11-18: qty 30, 30d supply, fill #0

## 2023-11-18 MED ORDER — AMLODIPINE BESYLATE 2.5 MG PO TABS
2.5000 mg | ORAL_TABLET | Freq: Every day | ORAL | 0 refills | Status: DC
  Filled 2023-11-18: qty 30, 30d supply, fill #0

## 2023-11-18 NOTE — Patient Instructions (Signed)
Center for [facility] at [facility] [address] [place], [place] [address] [phone] (main office) [phone] (Jupiter Boys's office)

## 2023-11-19 ENCOUNTER — Encounter: Payer: Self-pay | Admitting: Family Medicine

## 2023-11-19 ENCOUNTER — Other Ambulatory Visit (HOSPITAL_COMMUNITY): Payer: Self-pay

## 2023-11-19 ENCOUNTER — Ambulatory Visit: Payer: 59 | Attending: Family Medicine | Admitting: Family Medicine

## 2023-11-19 ENCOUNTER — Other Ambulatory Visit: Payer: Self-pay

## 2023-11-19 VITALS — BP 119/77 | HR 74 | Ht 62.0 in | Wt 197.2 lb

## 2023-11-19 DIAGNOSIS — G43009 Migraine without aura, not intractable, without status migrainosus: Secondary | ICD-10-CM

## 2023-11-19 DIAGNOSIS — R5383 Other fatigue: Secondary | ICD-10-CM

## 2023-11-19 DIAGNOSIS — R42 Dizziness and giddiness: Secondary | ICD-10-CM | POA: Diagnosis not present

## 2023-11-19 DIAGNOSIS — M79601 Pain in right arm: Secondary | ICD-10-CM | POA: Diagnosis not present

## 2023-11-19 DIAGNOSIS — I10 Essential (primary) hypertension: Secondary | ICD-10-CM

## 2023-11-19 DIAGNOSIS — D649 Anemia, unspecified: Secondary | ICD-10-CM

## 2023-11-19 DIAGNOSIS — I824Y2 Acute embolism and thrombosis of unspecified deep veins of left proximal lower extremity: Secondary | ICD-10-CM | POA: Diagnosis not present

## 2023-11-19 MED ORDER — TOPIRAMATE 50 MG PO TABS
50.0000 mg | ORAL_TABLET | Freq: Two times a day (BID) | ORAL | 1 refills | Status: DC
  Filled 2023-11-19 (×2): qty 60, 30d supply, fill #0
  Filled 2024-01-19: qty 60, 30d supply, fill #1
  Filled 2024-03-22: qty 60, 30d supply, fill #2

## 2023-11-19 MED ORDER — LISINOPRIL 20 MG PO TABS
20.0000 mg | ORAL_TABLET | Freq: Every day | ORAL | 1 refills | Status: DC
  Filled 2023-11-23: qty 30, 30d supply, fill #0
  Filled 2023-12-18 – 2023-12-19 (×2): qty 30, 30d supply, fill #1
  Filled 2024-01-19: qty 30, 30d supply, fill #2
  Filled 2024-02-18 (×2): qty 30, 30d supply, fill #3
  Filled 2024-03-22: qty 30, 30d supply, fill #4
  Filled 2024-04-19: qty 30, 30d supply, fill #5

## 2023-11-19 MED ORDER — SUMATRIPTAN SUCCINATE 100 MG PO TABS
100.0000 mg | ORAL_TABLET | Freq: Once | ORAL | 6 refills | Status: AC
  Filled 2023-11-19: qty 9, 30d supply, fill #0
  Filled 2024-01-19: qty 9, 30d supply, fill #1
  Filled 2024-07-29: qty 9, 30d supply, fill #2
  Filled 2024-10-11: qty 9, 30d supply, fill #3

## 2023-11-19 MED ORDER — HYDROXYZINE HCL 25 MG PO TABS
25.0000 mg | ORAL_TABLET | Freq: Every evening | ORAL | 1 refills | Status: AC | PRN
  Filled 2023-11-19: qty 30, 30d supply, fill #0

## 2023-11-19 MED ORDER — MECLIZINE HCL 25 MG PO TABS
25.0000 mg | ORAL_TABLET | Freq: Three times a day (TID) | ORAL | 1 refills | Status: AC | PRN
  Filled 2023-11-19 – 2024-01-05 (×2): qty 60, 20d supply, fill #0

## 2023-11-19 MED ORDER — AMLODIPINE BESYLATE 2.5 MG PO TABS
2.5000 mg | ORAL_TABLET | Freq: Every day | ORAL | 1 refills | Status: DC
Start: 2023-11-19 — End: 2024-05-18
  Filled 2023-11-23: qty 30, 30d supply, fill #0
  Filled 2023-12-18 – 2023-12-19 (×2): qty 30, 30d supply, fill #1
  Filled 2024-01-19: qty 30, 30d supply, fill #2
  Filled 2024-02-18: qty 30, 30d supply, fill #3
  Filled 2024-03-22: qty 30, 30d supply, fill #4
  Filled 2024-04-19: qty 30, 30d supply, fill #5

## 2023-11-19 NOTE — Progress Notes (Signed)
Subjective:  Patient ID: Barbara Dunn, female    DOB: 24-Apr-1971  Age: 53 y.o. MRN: [id number]  CC: Medical Management of Chronic Issues (Fatigue/Right arm pain)   HPI Barbara Dunn is a 53 y.o. year old female with a history of hypertension, migraine, right peroneal DVT (in 01/2023 completed treatment with Eliquis), newly diagnosed left lower extremity DVT involving left femoral, peroneal, popliteal, tibioperoneal trunk in 08/2023, MGUS, iron deficiency anemia.  Interval History: Discussed the use of AI scribe software for clinical note transcription with the patient, who gave verbal consent to proceed.  She presents with right arm pain and fatigue. The arm pain, which has been present for about a month, is located in the shoulder and sometimes radiates down the arm. The pain is intermittent and severe, rating it an 8 out of 10. The patient denies any numbness, tingling, or weakness in the arm. She is right-handed and has not noticed any difficulty with grip strength or dropping items. The patient also reports fatigue, which has been ongoing for some time.  Upper extremity Doppler ordered by her oncologist was negative for DVT.   She has been sleeping poorly, which she attributes to stress and a recent increase in household members due to a family emergency. The patient also reports heavy menstrual bleeding and has been taking iron pills. She is currently on Eliquis. She has a scheduled follow-up at the DVT clinic and the cancer center.   Her migraines are controlled on her current regimen.    Past Medical History:  Diagnosis Date   Anemia    Arthritis    Asthma    Bronchitis    Chronic kidney disease    DVT (deep venous thrombosis) (HCC)    Hypertension    Kidney stone    Migraines    Obesity     Past Surgical History:  Procedure Laterality Date   CARDIAC CATHETERIZATION N/A 08/02/2015   Procedure: Left Heart Cath and Coronary Angiography;  Surgeon: Lennette Bihari, MD;  Location: [facility]  INVASIVE CV LAB;  Service: Cardiovascular;  Laterality: N/A;   CERVIX LESION DESTRUCTION     TUBAL LIGATION     TUBAL LIGATION      Family History  Problem Relation Age of Onset   Hypertension Mother    Schizophrenia Mother    Schizophrenia Sister    Schizophrenia Brother    Schizophrenia Brother    Colon cancer Neg Hx    Esophageal cancer Neg Hx    Colon polyps Neg Hx    Rectal cancer Neg Hx    Stomach cancer Neg Hx     Social History   Socioeconomic History   Marital status: Single    Spouse name: Not on file   Number of children: Not on file   Years of education: Not on file   Highest education level: Not on file  Occupational History   Not on file  Tobacco Use   Smoking status: Former    Current packs/day: 0.00    Types: Cigarettes    Quit date: 12/15/2003    Years since quitting: 19.9   Smokeless tobacco: Never   Tobacco comments:    quit 10 years ago  Vaping Use   Vaping status: Never Used  Substance and Sexual Activity   Alcohol use: Yes    Alcohol/week: 2.0 standard drinks of alcohol    Types: 1 Glasses of wine, 1 Shots of liquor per week    Comment: Once every 3 months  Drug use: No   Sexual activity: Yes    Birth control/protection: Other-see comments    Comment: tubal  Other Topics Concern   Not on file  Social History Narrative   Pt has GED. CNA as well.    Social Drivers of Corporate investment banker Strain: Not on file  Food Insecurity: Food Insecurity Present (11/07/2023)   Hunger Vital Sign    Worried About Running Out of Food in the Last Year: Sometimes true    Ran Out of Food in the Last Year: Sometimes true  Transportation Needs: No Transportation Needs (11/07/2023)   PRAPARE - Administrator, Civil Service (Medical): No    Lack of Transportation (Non-Medical): No  Physical Activity: Not on file  Stress: Not on file  Social Connections: Not on file    No Known Allergies  Outpatient Medications Prior to Visit   Medication Sig Dispense Refill   albuterol (VENTOLIN HFA) 108 (90 Base) MCG/ACT inhaler Inhale 1 puff into the lungs every 6 (six) hours as needed for wheezing and shortness of breath 6.7 g 1   apixaban (ELIQUIS) 5 MG TABS tablet Take 1 tablet (5 mg total) by mouth 2 (two) times daily. Start taking after completion of starter pack. 60 tablet 5   APPLE CIDER VINEGAR PO Take 1 capsule by mouth daily.     BIOTIN PO Take 2 tablets by mouth daily.      cetirizine (ZYRTEC) 10 MG tablet Take 10 mg by mouth daily.     [employer] FIBER GUMMY BEARS CHILDREN PO Take by mouth. Also contains B12, vitamin C, and vitamin D     docusate sodium (COLACE) 100 MG capsule Take 100 mg by mouth as needed.      ELDERBERRY PO Take by mouth.     fluticasone (FLONASE) 50 MCG/ACT nasal spray PLACE 2 SPRAYS INTO BOTH NOSTRILS DAILY. 16 g 2   fluticasone (FLOVENT HFA) 110 MCG/ACT inhaler INHALE 1 PUFF INTO THE LUNGS 2 (TWO) TIMES DAILY. 12 g 2   Iron, Ferrous Sulfate, 325 (65 Fe) MG TABS Take 325 mg by mouth 2 (two) times daily. 60 tablet 3   Magnesium 250 MG TABS Take 250 mg by mouth daily.     Multiple Vitamins-Minerals (MULTIVITAMIN ADULT PO) Take 1 tablet by mouth daily.      oxymetazoline (AFRIN NASAL SPRAY) 0.05 % nasal spray Place 1 spray into both nostrils 2 (two) times daily. 30 mL 0   triamcinolone cream (KENALOG) 0.1 % APPLY 1 APPLICATION TOPICALLY 2 (TWO) TIMES DAILY. 30 g 2   vitamin C (ASCORBIC ACID) 250 MG tablet Take 250 mg by mouth daily.     amLODipine (NORVASC) 2.5 MG tablet Take 1 tablet (2.5 mg total) by mouth daily. 90 tablet 0   hydrOXYzine (ATARAX) 25 MG tablet Take 1 tablet (25 mg total) by mouth at bedtime as needed. 30 tablet 1   lisinopril (ZESTRIL) 20 MG tablet Take 1 tablet (20 mg total) by mouth daily. 90 tablet 0   meclizine (ANTIVERT) 25 MG tablet Take 1 tablet (25 mg total) by mouth 3 (three) times daily as needed for dizziness. 60 tablet 1   SUMAtriptan (IMITREX) 50 MG tablet TAKE 2 TABLETS (100  MG TOTAL) BY MOUTH ONCE FOR 1 DOSE. MAY REPEAT IN 2 HOURS IF HEADACHE PERSISTS, MAX 4 TABLETS/DAY 10 tablet 6   topiramate (TOPAMAX) 50 MG tablet Take 1 tablet (50 mg total) by mouth 2 (two) times daily. 60 tablet  6   norethindrone (MICRONOR) 0.35 MG tablet Take 1 tablet (0.35 mg total) by mouth daily. (Patient not taking: Reported on 11/19/2023) 28 tablet 11   No facility-administered medications prior to visit.     ROS Review of Systems  Constitutional:  Negative for activity change and appetite change.  HENT:  Negative for sinus pressure and sore throat.   Respiratory:  Negative for chest tightness, shortness of breath and wheezing.   Cardiovascular:  Negative for chest pain and palpitations.  Gastrointestinal:  Negative for abdominal distention, abdominal pain and constipation.  Genitourinary: Negative.   Musculoskeletal:        See HPI  Psychiatric/Behavioral:  Negative for behavioral problems and dysphoric mood.     Objective:  BP 119/77   Pulse 74   Ht 5\' 2"  (1.575 m)   Wt 197 lb 3.2 oz (89.4 kg)   SpO2 100%   BMI 36.07 kg/m      11/19/2023    3:28 PM 11/11/2023    1:35 PM 11/07/2023    1:43 PM  BP/Weight  Systolic BP 119 131 128  Diastolic BP 77 90 84  Wt. (Lbs) 197.2 194.3 194.7  BMI 36.07 kg/m2 35.54 kg/m2 35.61 kg/m2      Physical Exam Constitutional:      Appearance: She is well-developed.  Cardiovascular:     Rate and Rhythm: Normal rate.     Heart sounds: Normal heart sounds. No murmur heard. Pulmonary:     Effort: Pulmonary effort is normal.     Breath sounds: Normal breath sounds. No wheezing or rales.  Chest:     Chest wall: No tenderness.  Abdominal:     General: Bowel sounds are normal. There is no distension.     Palpations: Abdomen is soft. There is no mass.     Tenderness: There is no abdominal tenderness.  Musculoskeletal:     Right upper arm: Normal. No swelling or tenderness.     Left upper arm: Normal. No swelling or tenderness.      Right hand: Normal strength.     Left hand: Normal strength.     Right lower leg: No edema.     Left lower leg: No edema.  Neurological:     Mental Status: She is alert and oriented to person, place, and time.  Psychiatric:        Mood and Affect: Mood normal.        Latest Ref Rng & Units 11/11/2023    2:13 PM 09/19/2023   10:15 AM 09/15/2023   11:28 PM  CMP  Glucose 70 - 99 mg/dL 86  94  96   BUN 6 - 20 mg/dL 13  11  15    Creatinine 0.44 - 1.00 mg/dL 0.98  1.19  1.47   Sodium 135 - 145 mmol/L 139  138  138   Potassium 3.5 - 5.1 mmol/L 3.6  3.7  3.6   Chloride 98 - 111 mmol/L 108  110  110   CO2 22 - 32 mmol/L 26  20  20    Calcium 8.9 - 10.3 mg/dL 9.0  8.7  8.5   Total Protein 6.5 - 8.1 g/dL 6.8  7.0    Total Bilirubin 0.0 - 1.2 mg/dL 0.4  0.6    Alkaline Phos 38 - 126 U/L 48  46    AST 15 - 41 U/L 20  23    ALT 0 - 44 U/L 14  12      Lipid  Panel     Component Value Date/Time   CHOL 222 (H) 11/20/2022 0917   TRIG 63 11/20/2022 0917   HDL 123 11/20/2022 0917   CHOLHDL 2.1 11/02/2019 1009   CHOLHDL 1.8 07/27/2016 0917   VLDL 14 07/27/2016 0917   LDLCALC 88 11/20/2022 0917    CBC    Component Value Date/Time   WBC 7.2 11/11/2023 1413   WBC 6.2 09/19/2023 1015   RBC 3.98 11/11/2023 1417   RBC 3.99 11/11/2023 1413   HGB 11.2 (L) 11/11/2023 1413   HGB 11.6 02/15/2023 0944   HCT 35.4 (L) 11/11/2023 1413   HCT 36.9 02/15/2023 0944   PLT 257 11/11/2023 1413   PLT 265 02/15/2023 0944   MCV 88.7 11/11/2023 1413   MCV 90 02/15/2023 0944   MCH 28.1 11/11/2023 1413   MCHC 31.6 11/11/2023 1413   RDW 17.2 (H) 11/11/2023 1413   RDW 12.1 02/15/2023 0944   LYMPHSABS 2.7 11/11/2023 1413   LYMPHSABS 2.1 11/20/2022 0917   MONOABS 0.7 11/11/2023 1413   EOSABS 0.2 11/11/2023 1413   EOSABS 0.1 11/20/2022 0917   BASOSABS 0.1 11/11/2023 1413   BASOSABS 0.1 11/20/2022 1610    Lab Results  Component Value Date   HGBA1C 5.3 11/20/2022   Lab Results  Component Value Date    TSH 0.846 02/05/2020     Assessment & Plan:      Right Arm Pain Pain in the right arm for approximately one month, intermittent, severe at times (8/10). No associated numbness, tingling, or weakness. No neck pain. Pain originates in the shoulder and sometimes radiates down the arm. No recent trauma or injury reported. -Order right arm X-ray to rule out arthritis or other structural abnormalities. -Refer to physical therapy for evaluation and treatment. -Consider MRI if no improvement with physical therapy and normal X-ray.  Fatigue Reports of ongoing fatigue, exacerbated by recent personal stressors. Sleep is reportedly disturbed. Patient has a history of heavy menstrual bleeding and mild anemia (Hb 11.2). Last thyroid function test was normal in 2021, and Vitamin D was slightly low in 2020. -Order blood tests to check current thyroid function and Vitamin D levels. -Continue iron supplementation for anemia due to heavy menstrual bleeding. -Encourage follow-up with Gynecology for management of heavy menstrual bleeding.  Venous Thromboembolism History of recurrent blood clots, currently on Eliquis. Initial VTE was provoked by OCP but recent VTE was unprovoked.   No recent symptoms in the legs. Patient is followed by a DVT clinic and a cancer center. -Continue Eliquis as prescribed.  Duration of anticoagulation is uncertain at this time but will defer to oncology regarding guidance on this matter. -Encourage patient to keep appointments with DVT clinic and cancer center.  Hypertension -Controlled Patient reports needing refills for Lisinopril and Amlodipine. -Review medication list and ensure all necessary refills are provided.  Migraines Controlled with current medication. -Ensure adequate refills for migraine medication.          Meds ordered this encounter  Medications   amLODipine (NORVASC) 2.5 MG tablet    Sig: Take 1 tablet (2.5 mg total) by mouth daily.    Dispense:   90 tablet    Refill:  1    Dose decrease   hydrOXYzine (ATARAX) 25 MG tablet    Sig: Take 1 tablet (25 mg total) by mouth at bedtime as needed.    Dispense:  90 tablet    Refill:  1   lisinopril (ZESTRIL) 20 MG tablet  Sig: Take 1 tablet (20 mg total) by mouth daily.    Dispense:  90 tablet    Refill:  1   meclizine (ANTIVERT) 25 MG tablet    Sig: Take 1 tablet (25 mg total) by mouth 3 (three) times daily as needed for dizziness.    Dispense:  60 tablet    Refill:  1   SUMAtriptan (IMITREX) 100 MG tablet    Sig: Take 1 tablet (100 mg total) by mouth once for 1 dose at the onset of a migraine. May repeat 1 tablet in 2 hours if migraine persists.  Maximum daily dose 200 mg    Dispense:  10 tablet    Refill:  6   topiramate (TOPAMAX) 50 MG tablet    Sig: Take 1 tablet (50 mg total) by mouth 2 (two) times daily.    Dispense:  180 tablet    Refill:  1    Follow-up: Return in about 6 months (around 05/18/2024) for Chronic medical conditions.       Hoy Register, MD, FAAFP. [facility] [place], [place] [phone]   11/19/2023, 4:02 PM

## 2023-11-19 NOTE — Patient Instructions (Signed)
VISIT SUMMARY:  During today's visit, we discussed your right arm pain, fatigue, history of blood clots, and medication refills. We also reviewed your current management plan for migraines.  YOUR PLAN:  -RIGHT ARM PAIN: Right arm pain can be due to various causes, including muscle strain or joint issues. We will start with an X-ray to check for any structural problems and refer you to physical therapy. If there is no improvement, we may consider an MRI.  -FATIGUE: Fatigue can be caused by many factors, including stress and anemia. We will check your thyroid function and Vitamin D levels with blood tests. Continue taking your iron supplements and follow up with Gynecology for your heavy menstrual bleeding.  -VENOUS THROMBOEMBOLISM: Venous thromboembolism refers to blood clots in the veins. Continue taking Eliquis as prescribed and keep your appointments with the DVT clinic and cancer center.  -MEDICATION REFILLS: We have reviewed your medication list and will ensure you have refills for Lisinopril and Amlodipine.  -MIGRAINES: Your migraines are currently well-controlled with your medication. We will make sure you have enough refills.  INSTRUCTIONS:  1. Get a right arm X-ray as soon as possible. 2. Schedule and attend physical therapy sessions for your arm pain. 3. If your arm pain does not improve, we may need to do an MRI. 4. Get blood tests to check your thyroid function and Vitamin D levels. 5. Continue taking your iron supplements and follow up with Gynecology for heavy menstrual bleeding. 6. Keep your appointments with the DVT clinic and cancer center. 7. Ensure you have refills for Lisinopril, Amlodipine, and your migraine medication.

## 2023-11-20 ENCOUNTER — Ambulatory Visit
Admission: RE | Admit: 2023-11-20 | Discharge: 2023-11-20 | Disposition: A | Discharge status: Home / Self Care | Payer: 59 | Source: Ambulatory Visit | Attending: Family Medicine | Admitting: Family Medicine

## 2023-11-20 ENCOUNTER — Ambulatory Visit (HOSPITAL_COMMUNITY)
Admission: RE | Admit: 2023-11-20 | Discharge: 2023-11-20 | Disposition: A | Discharge status: Home / Self Care | Payer: 59 | Source: Ambulatory Visit | Attending: Vascular Surgery | Admitting: Vascular Surgery

## 2023-11-20 ENCOUNTER — Encounter: Payer: Self-pay | Admitting: Family Medicine

## 2023-11-20 VITALS — BP 123/81 | HR 73

## 2023-11-20 DIAGNOSIS — M25511 Pain in right shoulder: Secondary | ICD-10-CM | POA: Diagnosis not present

## 2023-11-20 DIAGNOSIS — M79601 Pain in right arm: Secondary | ICD-10-CM

## 2023-11-20 DIAGNOSIS — I82412 Acute embolism and thrombosis of left femoral vein: Secondary | ICD-10-CM | POA: Diagnosis not present

## 2023-11-20 DIAGNOSIS — M19011 Primary osteoarthritis, right shoulder: Secondary | ICD-10-CM | POA: Diagnosis not present

## 2023-11-20 LAB — T4, FREE: Free T4: 0.88 ng/dL (ref 0.82–1.77)

## 2023-11-20 LAB — TSH: TSH: 0.74 u[IU]/mL (ref 0.450–4.500)

## 2023-11-20 LAB — VITAMIN D 25 HYDROXY (VIT D DEFICIENCY, FRACTURES): Vit D, 25-Hydroxy: 31.9 ng/mL (ref 30.0–100.0)

## 2023-11-20 NOTE — Patient Instructions (Signed)
-  Continue apixaban (Eliquis) 5 mg twice daily. -Your refills have been sent to [facility]. You may need to call the pharmacy to ask them to fill this when you start to run low on your current supply.  -It is important to take your medication around the same time every day.  -Avoid NSAIDs like ibuprofen (Advil, Motrin) and naproxen (Aleve) as well as aspirin doses over 100 mg daily. -Tylenol (acetaminophen) is the preferred over the counter pain medication to lower the risk of bleeding. -Be sure to alert all of your health care providers that you are taking an anticoagulant prior to starting a new medication or having a procedure. -Monitor for signs and symptoms of bleeding (abnormal bruising, prolonged bleeding, nose bleeds, bleeding from gums, discolored urine, black tarry stools). If you have fallen and hit your head OR if your bleeding is severe or not stopping, seek emergency care.  -Go to the emergency room if emergent signs and symptoms of new clot occur (new or worse swelling and pain in an arm or leg, shortness of breath, chest pain, fast or irregular heartbeats, lightheadedness, dizziness, fainting, coughing up blood) or if you experience a significant color change (pale or blue) in the extremity that has the DVT.  -We recommend you wear compression stockings (20-30 mmHg) as long as you are having swelling or pain. Be sure to purchase the correct size and take them off at night.   If you have any questions or need to reschedule an appointment, please call [phone] [facility].  If you are having an emergency, call 911 or present to the nearest emergency room.   What is a DVT?  -Deep vein thrombosis (DVT) is a condition in which a blood clot forms in a vein of the deep venous system which can occur in the lower leg, thigh, pelvis, arm, or neck. This condition is serious and can be life-threatening if the clot travels to the arteries of the lungs and causing a blockage  (pulmonary embolism, PE). A DVT can also damage veins in the leg, which can lead to long-term venous disease, leg pain, swelling, discoloration, and ulcers or sores (post-thrombotic syndrome).  -Treatment may include taking an anticoagulant medication to prevent more clots from forming and the current clot from growing, wearing compression stockings, and/or surgical procedures to remove or dissolve the clot.

## 2023-11-20 NOTE — Progress Notes (Addendum)
DVT Clinic Note  Name: Barbara Dunn     MRN: [id number]     DOB: [birth year]     Sex: female  PCP: Hoy Register, MD  Today's Visit: Visit Information: Follow Up Visit  Referred to DVT Clinic by: Emergency Department - Dr. Durwin Nora, Dr. Manus Gunning Referred to CPP by: Dr. Lenell Antu Reason for referral:  Chief Complaint  Patient presents with   Med Management - DVT   HISTORY OF PRESENT ILLNESS: Barbara Dunn is a 53 y.o. female who presents after diagnosis of DVT with PMH recurrent DVT, HTN, migraine without aura, asthma, heavy menstrual bleeding, iron deficiency anemia who presents for follow up medication management after diagnosis of DVT on 09/14/23. Patient was diagnosed with first DVT 01/2023 involving the right peroneal vein and was seen in DVT Clinic at that time. She was on OCPs which she stopped 03/2023. She was evaluated by hematology at that time who found no hypercoagulable cause of DVT and since OCPs were discontinued, Eliquis was discontinued after 3 months. Of note, patient had heavy menstrual bleeding at baseline, which is what she was taking OCPs for and was worsened when taking Eliquis and after OCP discontinuation. She was seen 09/05/23 by OB/GYN and started on trial of progestin-only pill (norethindrone) for bleeding management. She presented to the ED 09/14/23 and found to have acute DVT of L femoral, popliteal, peroneal, and tibioperoneal trunk. She then presented to the ED on 09/19/23 with chest pain, SOB, and palpitations that began that day. RLE doppler showed no DVT. CTA showed nonocclusive embolus within the subsegmental pulmonary anterior branch in the posterior right lower lobe, likely subacute in age. Eliquis was continued. Last seen in DVT Clinic 09/30/23 at which time she reported mild pain in her L leg, some chest pain and ShOB that comes and goes. She is no longer taking birth control.   Today patient reports that she is not having any pain in her leg - she continues to have mild  swelling which is controlled with elevation and full length compression stocking. She reports that occasionally she still will have pain in her leg but is unable to say the last time this happened. Denies recent chest pain, endorses occasional ShOB with exertion. She reports that her uterine/menstrual bleeding was better for ~1.5-2 mo but she recently started having heavy bleeding again. She is following with gynecology and has a biopsy scheduled in March, after which she may decide to pursue a procedure to improve her bleeding. She denies other abnormal bleeding or bruising. She endorses dark stools, but states this has been going on for a long time - prior to Eliquis, and has not changed recently. She endorses occasional missed doses of Eliquis in the evening, but denies missed doses this week. She is taking doses about 12 hours apart. She was recently seen by hematology, but was unsure whether she will be seeing them again or what the purpose is of the blood work they are monitoring for her.   Positive Thrombotic Risk Factors: Previous VTE, Obesity, Other (comment) (initiation of norethindrone 1 week prior to DVT symptom onset, which has now been discontinued) Bleeding Risk Factors: Anticoagulant therapy, Anemia, Other (comment) (heavy uterine bleeding, follows with OBGYN)  Negative Thrombotic Risk Factors: Recent surgery (within 3 months), Recent trauma (within 3 months), Recent admission to hospital with acute illness (within 3 months), Paralysis, paresis, or recent plaster cast immobilization of lower extremity, Central venous catheterization, Bed rest >72 hours within 3 months, Sedentary journey lasting >8  hours within 4 weeks, Pregnancy, Within 6 weeks postpartum, Recent cesarean section (within 3 months), Estrogen therapy, Testosterone therapy, Erythropoiesis-stimulating agent, Active cancer, Non-malignant, chronic inflammatory condition, Recent COVID diagnosis (within 3 months), Known thrombophilic  condition, Smoking, Older age  Rx Insurance Coverage: Commercial Rx Affordability: Eliquis is $15/month on her insurance. She still has the copay card activated at her prior visits with me to reduce cost to $10/month  Rx Assistance Provided: Co-pay card Preferred Pharmacy: [facility]  Past Medical History:  Diagnosis Date   Anemia    Arthritis    Asthma    Bronchitis    Chronic kidney disease    DVT (deep venous thrombosis) (HCC)    Hypertension    Kidney stone    Migraines    Obesity     Past Surgical History:  Procedure Laterality Date   CARDIAC CATHETERIZATION N/A 08/02/2015   Procedure: Left Heart Cath and Coronary Angiography;  Surgeon: Lennette Bihari, MD;  Location: [facility];  Service: Cardiovascular;  Laterality: N/A;   CERVIX LESION DESTRUCTION     TUBAL LIGATION     TUBAL LIGATION      Social History   Socioeconomic History   Marital status: Single    Spouse name: Not on file   Number of children: Not on file   Years of education: Not on file   Highest education level: Not on file  Occupational History   Not on file  Tobacco Use   Smoking status: Former    Current packs/day: 0.00    Types: Cigarettes    Quit date: 12/15/2003    Years since quitting: 19.9   Smokeless tobacco: Never   Tobacco comments:    quit 10 years ago  Vaping Use   Vaping status: Never Used  Substance and Sexual Activity   Alcohol use: Yes    Alcohol/week: 2.0 standard drinks of alcohol    Types: 1 Glasses of wine, 1 Shots of liquor per week    Comment: Once every 3 months    Drug use: No   Sexual activity: Yes    Birth control/protection: Other-see comments    Comment: tubal  Other Topics Concern   Not on file  Social History Narrative   Pt has GED. CNA as well.    Social Drivers of Health   Financial Resource Strain: Medium Risk (11/19/2023)   Overall Financial Resource Strain (CARDIA)    Difficulty of Paying Living Expenses: Somewhat hard   Food Insecurity: Food Insecurity Present (11/19/2023)   Hunger Vital Sign    Worried About Running Out of Food in the Last Year: Sometimes true    Ran Out of Food in the Last Year: Sometimes true  Transportation Needs: No Transportation Needs (11/19/2023)   PRAPARE - Administrator, Civil Service (Medical): No    Lack of Transportation (Non-Medical): No  Physical Activity: Inactive (11/19/2023)   Exercise Vital Sign    Days of Exercise per Week: 0 days    Minutes of Exercise per Session: 0 min  Stress: Stress Concern Present (11/19/2023)   [employer] of Occupational Health - Occupational Stress Questionnaire    Feeling of Stress : To some extent  Social Connections: Moderately Isolated (11/19/2023)   Social Connection and Isolation Panel [NHANES]    Frequency of Communication with Friends and Family: Three times a week    Frequency of Social Gatherings with Friends and Family: Once a week    Attends Religious  Services: Never    Active Member of Clubs or Organizations: No    Attends Banker Meetings: 1 to 4 times per year    Marital Status: Never married  Intimate Partner Violence: Not At Risk (11/19/2023)   Humiliation, Afraid, Rape, and Kick questionnaire    Fear of Current or Ex-Partner: No    Emotionally Abused: No    Physically Abused: No    Sexually Abused: No    Family History  Problem Relation Age of Onset   Hypertension Mother    Schizophrenia Mother    Schizophrenia Sister    Schizophrenia Brother    Schizophrenia Brother    Colon cancer Neg Hx    Esophageal cancer Neg Hx    Colon polyps Neg Hx    Rectal cancer Neg Hx    Stomach cancer Neg Hx     Allergies as of 11/20/2023   (No Known Allergies)    Current Outpatient Medications on File Prior to Encounter  Medication Sig Dispense Refill   amLODipine (NORVASC) 2.5 MG tablet Take 1 tablet (2.5 mg total) by mouth daily. 90 tablet 1   apixaban (ELIQUIS) 5 MG TABS tablet Take 1 tablet (5  mg total) by mouth 2 (two) times daily. Start taking after completion of starter pack. 60 tablet 5   Iron, Ferrous Sulfate, 325 (65 Fe) MG TABS Take 325 mg by mouth 2 (two) times daily. 60 tablet 3   lisinopril (ZESTRIL) 20 MG tablet Take 1 tablet (20 mg total) by mouth daily. 90 tablet 1   SUMAtriptan (IMITREX) 100 MG tablet Take 1 tablet (100 mg total) by mouth once for 1 dose at the onset of a migraine. May repeat 1 tablet in 2 hours if migraine persists.  Maximum daily dose 200 mg 10 tablet 6   topiramate (TOPAMAX) 50 MG tablet Take 1 tablet (50 mg total) by mouth 2 (two) times daily. 180 tablet 1   albuterol (VENTOLIN HFA) 108 (90 Base) MCG/ACT inhaler Inhale 1 puff into the lungs every 6 (six) hours as needed for wheezing and shortness of breath 6.7 g 1   APPLE CIDER VINEGAR PO Take 1 capsule by mouth daily.     BIOTIN PO Take 2 tablets by mouth daily.      cetirizine (ZYRTEC) 10 MG tablet Take 10 mg by mouth daily.     [employer] FIBER GUMMY BEARS CHILDREN PO Take by mouth. Also contains B12, vitamin C, and vitamin D     docusate sodium (COLACE) 100 MG capsule Take 100 mg by mouth as needed.      ELDERBERRY PO Take by mouth.     fluticasone (FLONASE) 50 MCG/ACT nasal spray PLACE 2 SPRAYS INTO BOTH NOSTRILS DAILY. 16 g 2   fluticasone (FLOVENT HFA) 110 MCG/ACT inhaler INHALE 1 PUFF INTO THE LUNGS 2 (TWO) TIMES DAILY. 12 g 2   hydrOXYzine (ATARAX) 25 MG tablet Take 1 tablet (25 mg total) by mouth at bedtime as needed. 90 tablet 1   Magnesium 250 MG TABS Take 250 mg by mouth daily.     meclizine (ANTIVERT) 25 MG tablet Take 1 tablet (25 mg total) by mouth 3 (three) times daily as needed for dizziness. 60 tablet 1   Multiple Vitamins-Minerals (MULTIVITAMIN ADULT PO) Take 1 tablet by mouth daily.      oxymetazoline (AFRIN NASAL SPRAY) 0.05 % nasal spray Place 1 spray into both nostrils 2 (two) times daily. 30 mL 0   triamcinolone cream (KENALOG) 0.1 %  APPLY 1 APPLICATION TOPICALLY 2 (TWO) TIMES DAILY.  30 g 2   vitamin C (ASCORBIC ACID) 250 MG tablet Take 250 mg by mouth daily.     [DISCONTINUED] diphenhydrAMINE (BENADRYL) 25 mg capsule Take 25 mg by mouth every 6 (six) hours as needed for itching. Reported on 01/23/2016     No current facility-administered medications on file prior to encounter.   REVIEW OF SYSTEMS:  Review of Systems  Constitutional:  Positive for malaise/fatigue.  HENT:  Negative for nosebleeds.   Respiratory:  Negative for hemoptysis and shortness of breath.   Cardiovascular:  Positive for leg swelling (residual LLE swelling due to DVT). Negative for chest pain and palpitations.  Gastrointestinal:  Negative for blood in stool.  Genitourinary:  Negative for hematuria (Positive for heavy uterine bleeding).  Skin:  Negative for itching.   PHYSICAL EXAMINATION:  Vitals:   11/20/23 1349  BP: 123/81  Pulse: 73  SpO2: 100%   Physical Exam Constitutional:      Appearance: Normal appearance. She is normal weight.  Pulmonary:     Effort: Pulmonary effort is normal.     Breath sounds: Normal breath sounds.  Musculoskeletal:     Left lower leg: Edema (mild, non-pitting) present.  Neurological:     Mental Status: She is alert.  Psychiatric:        Mood and Affect: Mood normal.        Behavior: Behavior normal.        Thought Content: Thought content normal.   Villalta Score for Post-Thrombotic Syndrome: Pain: Absent Cramps: Absent Heaviness: Absent Paresthesia: Absent Pruritus: Absent Pretibial Edema: Mild Skin Induration: Absent Hyperpigmentation: Absent Redness: Absent Venous Ectasia: Absent Pain on calf compression: Absent Villalta Preliminary Score: 1 Is venous ulcer present?: No If venous ulcer is present and score is <15, then 15 points total are assigned: Absent Villalta Total Score: 1  LABS:  CBC     Component Value Date/Time   WBC 7.2 11/11/2023 1413   WBC 6.2 09/19/2023 1015   RBC 3.98 11/11/2023 1417   RBC 3.99 11/11/2023 1413   HGB  11.2 (L) 11/11/2023 1413   HGB 11.6 02/15/2023 0944   HCT 35.4 (L) 11/11/2023 1413   HCT 36.9 02/15/2023 0944   PLT 257 11/11/2023 1413   PLT 265 02/15/2023 0944   MCV 88.7 11/11/2023 1413   MCV 90 02/15/2023 0944   MCH 28.1 11/11/2023 1413   MCHC 31.6 11/11/2023 1413   RDW 17.2 (H) 11/11/2023 1413   RDW 12.1 02/15/2023 0944   LYMPHSABS 2.7 11/11/2023 1413   LYMPHSABS 2.1 11/20/2022 0917   MONOABS 0.7 11/11/2023 1413   EOSABS 0.2 11/11/2023 1413   EOSABS 0.1 11/20/2022 0917   BASOSABS 0.1 11/11/2023 1413   BASOSABS 0.1 11/20/2022 0917    Hepatic Function      Component Value Date/Time   PROT 6.8 11/11/2023 1413   PROT 7.1 11/20/2022 0917   ALBUMIN 3.9 11/11/2023 1413   ALBUMIN 4.3 11/20/2022 0917   AST 20 11/11/2023 1413   ALT 14 11/11/2023 1413   ALKPHOS 48 11/11/2023 1413   BILITOT 0.4 11/11/2023 1413    Renal Function   Lab Results  Component Value Date   CREATININE 1.01 (H) 11/11/2023   CREATININE 0.97 09/19/2023   CREATININE 0.90 09/15/2023    Estimated Creatinine Clearance: 68.5 mL/min (A) (by C-G formula based on SCr of 1.01 mg/dL (H)).   [facility] Vascular Lab Studies:  02/01/23 VAS [place] LOWER EXTREMITY VENOUS (DVT) RIGHT  Summary:  RIGHT:  - Findings consistent with acute deep vein thrombosis involving the right  peroneal veins.  - No cystic structure found in the popliteal fossa.    LEFT:  - No evidence of common femoral vein obstruction.    09/14/23 VAS [place] LOWER EXTREMITY VENOUS (DVT) LEFT Summary:  RIGHT:  - No evidence of common femoral vein obstruction.    LEFT:  - Findings consistent with acute deep vein thrombosis involving the left femoral vein, left peroneal veins, left popliteal vein, and tibioperoneal trunk.  - No cystic structure found in the popliteal fossa.    09/19/23 VAS [place] LOWER EXTREMITY VENOUS (DVT) RIGHT Summary:  RIGHT:  - There is no evidence of deep vein thrombosis in the lower extremity.  - No cystic structure found in the  popliteal fossa.    LEFT:  - No evidence of common femoral vein obstruction.    09/16/23 CT venogram IMPRESSION: 1. No evidence of deep venous thrombosis or inguinal hernia. 2. Small fat containing umbilical hernia. 3. Right renal calculus. 4. Uterine fibroid.   09/19/23 CTA (PE) IMPRESSION: Single nonocclusive embolus within a subsegmental pulmonary artery branch in the posterior right lower lobe, likely subacute in age.   No other sites of pulmonary embolism identified.  ASSESSMENT: Location of DVT: Left femoral vein, Left popliteal vein, Left distal vein Cause of DVT: unprovoked  Patient with history of right peroneal DVT 01/2023 felt to be related to OCPs which were discontinued and treated with three months of Eliquis, now with recurrent DVT but involving the LLE this time (left femoral, popliteal, peroneal, and tibioperoneal trunk; no DVT found in the RLE) as well as nonocclusive subacute PE in the RLL with symptom onset 1 week after starting a progestin-only pill (norethindrone) for heavy menstrual bleeding. At presentation in the ED, she was restarted on Eliquis and Dr. Karin Lieu was consulted. Patient was not a candidate for thrombectomy, and he recommended continuing anticoagulation, compression and elevation. Today she reports improvement in s/sx related to LLE DVT and subacute PE with improved pain, swelling, and no CP or ShOB recently. She states that her uterine bleeding has restarted, but she is otherwise tolerating Eliquis well. Her OBGYN has scheduled a biopsy in March and then will be discussing procedures with her to manage the bleeding. Hgb slightly low, but stable, and plt WNL. She is not having other s/sx of bleeding at this time, as her dark stools are likely related to iron supplementation. Colonoscopy in Apr 2024 without abnormalities. Appropriate to continue Eliquis with close follow-up. Anticipate that patient will need lifelong anticoagulation in the setting of  unprovoked and recurrent VTE, though will defer further management to hematology. May be  a good candidate for reduced dose Eliquis after 6 mo of treatment if uterine bleeding is an ongoing issue. Patient was informed to continue following with hematology for anticoagulation management and is aware to reach out to DVT clinic with any further needs. No issues with medication access or affordability. She has refills to last her until her next hematology appt in Apr 2025. All questions were answered.   PLAN: -Continue apixaban (Eliquis) 5 mg twice daily. -Expected duration of therapy: anticipate indefinite anticoagulation, but will defer further management to hematology. Therapy started on 09/14/23. -Patient educated on purpose, proper use and potential adverse effects of apixaban (Eliquis). -Discussed importance of taking medication around the same time every day. -Advised patient of medications to avoid (NSAIDs, aspirin doses >100 mg daily). -Educated that Tylenol (acetaminophen) is  the preferred analgesic to lower the risk of bleeding. -Advised patient to alert all providers of anticoagulation therapy prior to starting a new medication or having a procedure. -Emphasized importance of monitoring for signs and symptoms of bleeding (abnormal bruising, prolonged bleeding, nose bleeds, bleeding from gums, discolored urine, black tarry stools). -Educated patient to present to the ED if emergent signs and symptoms of new thrombosis occur. -Counseled patient to wear compression stockings daily, removing at night.  Patient is discharged from the DVT Clinic:  -Patient has follow up scheduled with hematology on 02/11/24.   Follow-up: with DVT clinic as needed  Nils Pyle, PharmD PGY1 Pharmacy Resident  Pervis Hocking, PharmD, BCACP, CPP Deep Vein Thrombosis Clinic Clinical Pharmacist Practitioner Office: [phone]

## 2023-11-22 ENCOUNTER — Telehealth: Payer: Self-pay | Admitting: *Deleted

## 2023-11-22 NOTE — Telephone Encounter (Addendum)
Contacted patient per MD request with message below. Patient verbalized understanding of information. She is taking oral iron. Provided information per Dr. Derek Mound message regarding option to have IV iron and that iron infusions can typically build up iron level quicker than oral iron. Ms. Mackel said she has experienced heavy menstrual bleeding since last labs were done and thinks her iron levels may be even lower than they were then.  She states she would like to have Iron infusions    ----- Message ----- From: Jaci Standard, MD Sent: 11/18/2023  10:20 AM EST  Please let Mrs. Wieneke know that even though her iron levels are improving on PO iron, she is still iron deficient. She can either continue to PO iron therapy or we can set her up for iron infusions. Please let me know what she would like to do. ----- Message ----- From: Interface, Lab In [place] Sent: 11/11/2023   2:30 PM EST To: Jaci Standard, MD

## 2023-11-25 ENCOUNTER — Other Ambulatory Visit (HOSPITAL_COMMUNITY): Payer: Self-pay

## 2023-11-25 ENCOUNTER — Encounter: Payer: Self-pay | Admitting: Family Medicine

## 2023-11-25 ENCOUNTER — Other Ambulatory Visit: Payer: Self-pay | Admitting: Family Medicine

## 2023-11-25 DIAGNOSIS — Z1231 Encounter for screening mammogram for malignant neoplasm of breast: Secondary | ICD-10-CM
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
## 2023-11-27 ENCOUNTER — Other Ambulatory Visit: Payer: Self-pay | Admitting: Hematology and Oncology

## 2023-11-28 ENCOUNTER — Other Ambulatory Visit: Payer: Self-pay | Admitting: Hematology and Oncology

## 2023-12-02 NOTE — Therapy (Signed)
OUTPATIENT PHYSICAL THERAPY UPPER EXTREMITY EVALUATION   Patient Name: Barbara Dunn MRN: [id number] DOB:Jul 21, 1971, 53 y.o., female Today's Date: 12/03/2023  END OF SESSION:  PT End of Session - 12/03/23 1351     Visit Number 1    Number of Visits 1    PT Start Time 1400    PT Stop Time 1445    PT Time Calculation (min) 45 min    Activity Tolerance Patient tolerated treatment well    Behavior During Therapy WFL for tasks assessed/performed             Past Medical History:  Diagnosis Date   Anemia    Arthritis    Asthma    Bronchitis    Chronic kidney disease    DVT (deep venous thrombosis) (HCC)    Hypertension    Kidney stone    Migraines    Obesity    Past Surgical History:  Procedure Laterality Date   CARDIAC CATHETERIZATION N/A 08/02/2015   Procedure: Left Heart Cath and Coronary Angiography;  Surgeon: Lennette Bihari, MD;  Location: [facility];  Service: Cardiovascular;  Laterality: N/A;   CERVIX LESION DESTRUCTION     TUBAL LIGATION     TUBAL LIGATION     Patient Active Problem List   Diagnosis Date Noted   Acute deep vein thrombosis (DVT) of femoral vein of left lower extremity (HCC) 09/30/2023   Single subsegmental pulmonary embolism without acute cor pulmonale (HCC) 09/30/2023   MGUS (monoclonal gammopathy of unknown significance) 06/25/2023   Chronic anticoagulation 03/29/2023   Anemia 03/28/2023   Primary hypercoagulable state (HCC) 03/24/2023   Acute deep vein thrombosis (DVT) of right peroneal vein (HCC) 02/01/2023   Pulmonary nodule 01/24/2022   Asthma 01/24/2022   Paroxysmal SVT (supraventricular tachycardia) (HCC) 04/21/2020   Personal history of COVID-19 04/21/2020   Seasonal allergies 01/23/2016   Oral contraceptive use 10/19/2015   Non-cardiac chest pain    Palpitations    Abnormal nuclear stress test    Dizziness and giddiness 05/30/2015   Chest pain 05/30/2015   Knee contusion 05/04/2015   Vaginal discharge 02/12/2015    Abnormal uterine bleeding 02/11/2015   Right brachial plexitis 09/03/2014   Shortness of breath 04/20/2014   Pain in left knee 04/20/2014   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   Candidal intertrigo 11/09/2013   Health care maintenance 11/09/2013   Hair loss 09/15/2013   Pain in right shoulder 08/18/2013   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   Hypertension 07/13/2012   Leg pain 03/27/2012   ANEMIA, IRON DEFICIENCY NOS 03/21/2007   Migraine without aura 01/17/2007   OBESITY, NOS 12/19/2006    PCP: Hoy Register, MD  REFERRING PROVIDER: Hoy Register, MD  REFERRING DIAG: Right arm pain [M79.601]   THERAPY DIAG:  Radicular pain in right arm  Stiffness of right shoulder, not elsewhere classified  Rationale for Evaluation and Treatment: Rehabilitation  ONSET DATE: pt cannot recall  SUBJECTIVE:  SUBJECTIVE STATEMENT: Eval statement 12/03/2023: the pain comes and goes, goes own into the arm on the R side. Not currently having an issue with pain. Just feels like a throbbing pain that goes down the arm and then goes away. Occasionally the whole hand goes numb. Last episode was over a week ago. Hand dominance: Right  PERTINENT HISTORY: HTN, Paroxysmal SVT, hx of DVT,  PAIN:  Are you having pain? No  PRECAUTIONS: None  RED FLAGS: None   WEIGHT BEARING RESTRICTIONS: No  FALLS:  Has patient fallen in last 6 months? No  LIVING ENVIRONMENT: Lives with: lives alone Lives in: House/apartment Stairs: No Has following equipment at home: None  OCCUPATION: CNA, private duty nursing  PLOF: Independent  PATIENT GOALS: reduce pain, see need for therapy  NEXT MD VISIT: no visit scheduled currently  OBJECTIVE:  Note: Objective measures were completed at Evaluation unless otherwise  noted.  DIAGNOSTIC FINDINGS:  IMPRESSION 11/20/2023: Mild degenerative change of the right glenohumeral joint.  PATIENT SURVEYS :  Quick Dash 0 (Pt reports no difficulties with anything within past week)  COGNITION: Overall cognitive status: Within functional limits for tasks assessed     SENSATION: Light touch: Impaired   POSTURE: Rounded shoulders Forward head  UPPER EXTREMITY ROM:   Active ROM Right eval Left eval  Shoulder flexion [facility] WFL  Shoulder extension [facility]  Shoulder abduction [facility]  Shoulder adduction    Shoulder internal rotation [facility] WFL  Shoulder external rotation [facility] WFl  Elbow flexion    Elbow extension    Wrist flexion    Wrist extension    Wrist ulnar deviation    Wrist radial deviation    Wrist pronation    Wrist supination    (Blank rows = not tested)  UPPER EXTREMITY MMT:  MMT Right eval Left eval  Shoulder flexion 5 5  Shoulder extension 5 5  Shoulder abduction 5! 5  Shoulder adduction    Shoulder internal rotation    Shoulder external rotation    Middle trapezius    Lower trapezius    Elbow flexion 5 5  Elbow extension 5 5  Wrist flexion    Wrist extension    Wrist ulnar deviation    Wrist radial deviation    Wrist pronation    Wrist supination    Grip strength (lbs)    (Blank rows = not tested)  SHOULDER SPECIAL TESTS: Impingement tests: Painful arc test: negative Rotator cuff assessment: Drop arm test: negative and Hornblower's sign: negative Biceps assessment: Yergason's test: negative Adsons test: + Neueral tension median/ulnar: +  JOINT MOBILITY TESTING:  Not indicated  PALPATION:  Tenderness to shoulder girdle and pec minor                                             [facility] Adult PT Treatment:                                                DATE: 12/03/2023  Self Care: Patient education, detailed below   PATIENT EDUCATION: Education details: Pt received education regarding HEP performance, ADL performance,  functional activity tolerance, impairment education, appropriate performance of therapeutic activities. Person educated: Patient Education method: Explanation, Demonstration, Tactile cues, Verbal  cues, and Handouts Education comprehension: verbalized understanding and returned demonstration  HOME EXERCISE PROGRAM: Access Code: [email or web address] URL: [email or web address] Date: 12/03/2023 Prepared by: Sheliah Plane  Exercises - Doorway Pec Stretch at 90 Degrees Abduction  - 1 x daily - 7 x weekly - 2 sets - 1 reps - 23m hold - Scapular Retraction with Resistance  - 1 x daily - 4-7 x weekly - 2-3 sets - 10 reps - 3s hold - Prone Scapular Retraction Y  - 1 x daily - 4-7 x weekly - 2-3 sets - 8-12 reps - 4s hold - Prone Scapular Retraction Arms at Side  - 1 x daily - 4-7 x weekly - 2-3 sets - 8-12 reps - 4s hold - Standing Single Arm Shoulder Abduction with Resistance  - 1 x daily - 4-7 x weekly - 2-3 sets - 10 reps - 3s hold  ASSESSMENT:  CLINICAL IMPRESSION: Eval impression (12/03/2023): Pt. attended today's physical therapy session for evaluation of R arm pain. Pt has no pain today however has complaints of intermittently recurring pain in the shoulder that travels down the arm into the fingers. Pt has notable deficits with neural tension, muscle length of pectorals discernable due to upper cross posture.  Signs and symptoms are concurrent with neurological thoracic outlet. Pt would benefit from therapeutic focus on pec minor length, postural education, and peripheral nerve motility.  Treatment performed today focused on patient education detailed in obj.  Pt demonstrated great understanding of education provided. required minimal verbal/tactile cues and no assistance for appropriate performance with today's activities.  While PT is indicated pt requested to manage symptoms at home using administered HEP. Pt refuses future sessions and stated that she will reach back out if things get  worse.     OBJECTIVE IMPAIRMENTS: decreased knowledge of condition, impaired flexibility, impaired sensation, impaired tone, postural dysfunction, and pain.   ACTIVITY LIMITATIONS: carrying, bathing, dressing, and reach over head  PARTICIPATION LIMITATIONS: meal prep, cleaning, laundry, driving, community activity, and occupation  PERSONAL FACTORS: Behavior pattern and Pt does not want to return to therapy  are also affecting patient's functional outcome.   REHAB POTENTIAL: Fair pt refuses to attend further therapy session.  CLINICAL DECISION MAKING: Stable/uncomplicated  EVALUATION COMPLEXITY: Low  GOALS: Goals reviewed with patient? Yes  SHORT TERM GOALS: Target date: Not indicated.   LONG TERM GOALS: Target date: Not indicated.  PLAN: PT FREQUENCY: one time visit  PT DURATION: other: Pt states she wishes to manage this at home and does not want to return to therapy for the time being.  PLANNED INTERVENTIONS:  PLAN FOR NEXT SESSION: Review HEP, Begin POC as detailed in assessment  Sheliah Plane, PT, DPT 12/03/2023, 4:33 PM

## 2023-12-03 ENCOUNTER — Other Ambulatory Visit: Payer: Self-pay

## 2023-12-03 ENCOUNTER — Ambulatory Visit: Payer: 59 | Attending: Family Medicine | Admitting: Physical Therapy

## 2023-12-03 ENCOUNTER — Encounter: Payer: Self-pay | Admitting: Physical Therapy

## 2023-12-03 DIAGNOSIS — M25611 Stiffness of right shoulder, not elsewhere classified: Secondary | ICD-10-CM | POA: Insufficient documentation

## 2023-12-03 DIAGNOSIS — M792 Neuralgia and neuritis, unspecified: Secondary | ICD-10-CM | POA: Insufficient documentation

## 2023-12-03 DIAGNOSIS — M79601 Pain in right arm: Secondary | ICD-10-CM | POA: Insufficient documentation

## 2023-12-04 ENCOUNTER — Encounter: Payer: Self-pay | Admitting: Clinical

## 2023-12-04 ENCOUNTER — Ambulatory Visit (INDEPENDENT_AMBULATORY_CARE_PROVIDER_SITE_OTHER): Payer: 59 | Admitting: Clinical

## 2023-12-04 ENCOUNTER — Telehealth: Payer: Self-pay | Admitting: Pharmacy Technician

## 2023-12-04 DIAGNOSIS — Z658 Other specified problems related to psychosocial circumstances: Secondary | ICD-10-CM

## 2023-12-04 DIAGNOSIS — F4321 Adjustment disorder with depressed mood: Secondary | ICD-10-CM

## 2023-12-04 DIAGNOSIS — F4323 Adjustment disorder with mixed anxiety and depressed mood: Secondary | ICD-10-CM | POA: Diagnosis not present

## 2023-12-04 NOTE — Telephone Encounter (Signed)
Dr. Leonides Schanz,  Duncan Dull is non preferred and will be denied if patient has not failed preferred medication. Preferred medication is Venofer.  Would you like to [place] Venofer?   Auth Submission: NO AUTH NEEDED Site of care: Site of care: [facility] Payer: [employer] Medication & CPT/J Code(s) submitted: Venofer (Iron Sucrose) J1756 Route of submission (phone, fax, portal):  Phone # Fax # Auth type: Buy/Bill PB Units/visits requested:  Reference number:  Approval from: 12/04/23 to 05/02/24

## 2023-12-04 NOTE — Patient Instructions (Addendum)
Center for [facility] at [facility] [address] [place], [place] [address] [phone] (main office) [phone] [facility] office)   Transportation Resources [facility] [employer] ([employer]) [address] [place], [place], [place] [address] [email or web address]/[email or web address]-[email or web address]-transit-agency-public-transportation-division     Fixed-route bus services, including regional fare cards for PART, [place], [place], and [place] buses.  Reduced fare bus ID's available for Medicaid, Medicare, and "orange card" recipients.  [employer] offers curb-to-curb and door-to-door bus services for people with disabilities who are unable to use a fixed-bus route; also offers a shared-ride program.   Helpful tips:  -Routes available online and physical maps available at the main bus hub lobby (each for a specific route) -Smartphone directions often include bus routes (see the "bus" icon, next to the "car" and "walk" icons) -Routes differ on weekends, evenings and holidays, so plan ahead!  -If you have Medicaid, Medicare, or orange card, plan to obtain a reduced-fare ID to save 50% on rides. Check days and times to obtain an ID, and bring all necessary documents.   Wheels [address], [place], [place] [address] [phone] www.[email or web address] **REFERRAL NEEDED by specific agencies (see website), after meeting specified criteria only  Behavioral Health Resources:   What if I or someone I know is in crisis?  If you are thinking about harming yourself or having thoughts of suicide, or if you know someone who is, seek help right away.  Call your doctor or mental health care provider.  Call 911 or go to a hospital emergency room to get immediate help, or ask a friend or family member to help you do these things; IF YOU ARE IN [place], YOU MAY GO TO WALK-IN URGENT CARE 24/7 at [facility] (see below)  Call the [place] National Suicide Prevention Lifeline's toll-free, 24-hour hotline at 1-800-273-TALK 8453424109) or TTY: 1-800-799-4 TTY 430-682-9403) to talk to a trained counselor.  If you are in crisis, make sure you are not left alone.   If someone else is in crisis, make sure he or she is not left alone   24 Hour :   [facility]  [address], [place], [place] [address] [phone] or [phone] WALK-IN URGENT CARE 24/7  [facility]rapeutic Alternative Mobile Crisis: [phone]  [place] National Suicide Hotline: 818-445-0298  Family Service of the [employer] (Domestic Violence, Rape & Victim Assistance)  760-022-2813  [facility]  [address], [place]  [address]   [phone] or [phone]   [facility] [employer] Crisis Services: [phone] (8am-4pm) or [phone] (after hours)        [facility], [address] [place], [place]  [phone] Fax: [phone] [email or web address] *Interpreters available *Accepts all insurance and uninsured for Urgent Care needs *Accepts Medicaid and uninsured for outpatient treatment   [facility]   Mon-Fri: 8am-5pm [address], [place], [place] [phone](QQPYP); [phone](fax) [email or web address]  *Accepts Medicare  [facility] [place], Fri: 8am-4pm [address], [place], [place] [address] [phone] (phone); [phone] (fax) ExShows.dk  *Accepts Medicare  [facility] Mon-Fri: 9am-5pm  [address] [place], [place] [phone] (phone); [phone]  MommyCollege.dk  *Accepts Medicaid  [facility] [place], 8:30am-12pm/1pm-2:30pm [address] [place], [place]  [phone] (phone); [phone] (fax) www.[email or web address]  *Accepts Medicaid, sliding-scale*Bilingual services available  [facility] Mon-Fri, 8am-7pm [address] [place],  [place]  [phone](UJWJX); [phone](fax) www.[email or web address]  *Accepts Medicaid *Bilingual services available  [facility] Mon-Fri: 8am-5pm, Saturday by appointment only [address] [place], [place], [place] [phone] (phone); [phone] (fax) [email or web address]   [facility] [address], [place], [place] [phone] www.[email or web address]  *Free & reduced services for uninsured and underinsured individuals *Bilingual services for Spanish-speaking clients 21 and under  [facility], [address], [place], [place] [phone](QVZDG); [phone](fax) KittenExchange.at  *Bring your own interpreter at first visit *Accepts Medicare and [employer]  [facility] Mon-Fri: 9am-5:30pm [address], [place], [place] [phone] (phone), [phone] (fax) After hours crisis line: [phone] www.[email or web address]  *Accepts Medicare and Medicaid  [employer], 8am-6pm [address], [place], [place]  [phone] (phone); [phone] (fax) http://[email or web address]  *Subsidized costs available  [facility] Mon-Fri: 8am-4pm [address] [place], [place] [phone](WNIOE); [phone](fax) www.p[email or web address]  *Accepts Medicaid  [facility] Same day access hours: Mon-Fri, 8:30-3pm Crisis hours: Mon-Fri, 8am-5pm [address], [place], [place] [phone]  [facility] [employer] Same day access hours: Mon-Fri, 8:30-3pm Crisis hours: Mon-Fri, 8am-8pm [address] [place], [place] [phone] (phone); [phone]  (fax) www.[email or web address]  *Accepts Medicaid and Medicare  [facility] [place], [place], Fri: 9am-9pm Tues, Thurs: 9am-6pm [address] [place], [place], [place]  [phone] (phone); [phone] (fax) [email or web address]://[email or web address]  *(Accepts Medicare and Medicaid; payment plans available)*Bilingual services available  [facility] [address] [place], [place] [phone] (phone); [phone] (fax) www.[email or web address]   [facility] [address], [place], [place]  [phone]  RackRewards.fr  *Bilingual services available  [facility]) Mon-Thurs: 8am-8pm [address], [place], [place] [phone] (phone); [phone] (fax) ScrapbookLive.si  *Accepts Medicaid*Bilingual services available  [facility] [address] [place], [place] [phone] (phone) BrotherBig.at  *Accepts Medicaid *Bilingual services available  [facility] Mon-Fri, 9am-4:45pm [address], [place], [place] [phone] (phone); [phone] (fax) http://[email or web address]-[email or web address]  *Accepts Medicare  [facility] Mon-Thurs: 8:30-8pm, Fri: 8:30am-7pm [address] [place], [place] (3rd floor) [phone] (phone); [phone] (fax) [email or web address]  *Accepts Medicaid; income-based reduced rates available  [facility] Mon-Fri: 8am-5pm [address], [place], [place] [address] [phone] (phone); [phone] (fax) http://[email or web address]  *Accepts Medicaid*Bilingual services available   [facility] [place])  [address], [place] [phone] www.[email or web address]  *Provides direct services to individuals in recovery from mental illness, including support groups, recovery skills classes, and one on one peer support  [employer] on Mental Illness) [place] helpline: (873)619-1075  NAMI Experiment helpline: 7174439528 https://namiguilford.org  *A community hub for information relating to local resources and services for the friends and families of individuals living alongside a mental health condition, as well as the individuals themselves. Classes and support groups also provided   /Emotional [employer] and Websites Here are a few free apps meant to help you to help yourself.  To find, try searching on the internet to see if the app is offered on Apple/Android devices. If your first choice doesn't come up on your device, the good news is that there are many choices! Play around with different apps to see which ones are helpful to you.    Calm This is an app meant to help increase calm feelings. Includes info, strategies, and tools for tracking your feelings.      Calm Harm  This app is meant to help with self-harm. Provides many 5-minute or  15-min coping strategies for doing instead of hurting yourself.       Healthy Minds Health Minds is a problem-solving tool to help deal with emotions and cope with stress you encounter wherever you are.      MindShift This app can help people cope with anxiety. Rather than trying to avoid anxiety, you can make an important shift and face it.      MY3  MY3 features a support system, safety plan and resources with the goal of offering a tool to use in a time of need.       My Life My Voice  This mood journal offers a simple solution for tracking your thoughts, feelings and moods. Animated emoticons can help identify your mood.       Relax Melodies Designed to help with sleep, on this app you can mix sounds and meditations for relaxation.      Smiling Mind Smiling Mind is meditation made easy: it's a simple tool that helps put a smile on your mind.        Stop, Breathe & Think  A friendly, simple guide for people through meditations for  mindfulness and compassion.  Stop, Breathe and Think Kids Enter your current feelings and choose a "mission" to help you cope. Offers videos for certain moods instead of just sound recordings.       Team Orange The goal of this tool is to help teens change how they think, act, and react. This app helps you focus on your own good feelings and experiences.      The United Stationers Box The United Stationers Box (VHB) contains simple tools to help patients with coping, relaxation, distraction, and positive thinking.

## 2023-12-05 ENCOUNTER — Other Ambulatory Visit: Payer: Self-pay | Admitting: Hematology and Oncology

## 2023-12-05 ENCOUNTER — Encounter: Payer: Self-pay | Admitting: Hematology and Oncology

## 2023-12-05 NOTE — Addendum Note (Signed)
Addended by: Ihor Austin D on: 12/05/2023 09:04 AM   Modules accepted: Orders

## 2023-12-06 ENCOUNTER — Ambulatory Visit
Admission: RE | Admit: 2023-12-06 | Discharge: 2023-12-06 | Disposition: A | Discharge status: Home / Self Care | Payer: 59 | Source: Ambulatory Visit | Attending: Family Medicine | Admitting: Family Medicine

## 2023-12-06 DIAGNOSIS — Z1231 Encounter for screening mammogram for malignant neoplasm of breast: Secondary | ICD-10-CM | POA: Diagnosis not present

## 2023-12-09 NOTE — BH Specialist Note (Unsigned)
 Integrated Behavioral Health Follow Up In-Person Visit  MRN: [id number] Name: Barbara Dunn  Number of Integrated Behavioral Health Clinician visits: 3- Third Visit  Session Start time: 1400   Session End time: 1452  Total time in minutes: 52   Types of Service: Individual psychotherapy  Interpretor:No. Interpretor Name and Language: n/a  Subjective: Barbara Dunn is a 53 y.o. female accompanied by  n/a Patient was referred by Lorriane Shire, MD for life stress. Patient reports the following symptoms/concerns: Financial stress (working to catch up bills; worry that future car repairs will impede financial goals) and finding herself giving too much to others in need; poor reactions from others if she says no to giving financial support. Pt also concerned about her health with blood clot.  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 Self-Care: Strengthening healthy boundaries; beginning to consider self-care options Life Changes: significant family losses (mother; brothers); daughter moved out of the home  Patient and/or Family's Strengths/Protective Factors: Concrete supports in place (healthy food, safe environments, etc.) and Sense of purpose  Goals Addressed: Patient will:  Reduce symptoms of: anxiety, depression, and stress   Increase knowledge and/or ability of: self-management skills and stress reduction   Demonstrate ability to: Increase healthy adjustment to current life circumstances and Increase motivation to adhere to plan of care  Progress towards Goals: Ongoing  Interventions: Interventions utilized:  Motivational Interviewing and Supportive Reflection Standardized Assessments completed: GAD-7 and PHQ 9  Patient and/or Family Response: Patient  agrees with treatment plan.   Patient Centered Plan: Patient is on the following Treatment Plan(s): IBH Assessment: Patient currently experiencing Adjustment disorder with mixed anxious and depressed mood; Grief; Psychosocial stress.   Patient may benefit from continued therapeutic intervention .  Plan: Follow up with behavioral health clinician on : Two weeks Behavioral recommendations:  -Continue using daily self-coping strategies (relaxation breathing, worry time, etc) -Continue setting healthy boundaries with others (it's okay to say no; remember "you can't pour from an empty pitcher") -Continue striving towards goal of getting all bills caught up -Continue plan to go to ED if you experience any chest pain in the future Referral(s): Integrated [employer] [facility] (In Clinic)  [address] [place], [place]     12/17/2023    1:36 PM 11/19/2023    3:30 PM 11/07/2023    5:08 PM 09/05/2023    4:17 PM 05/14/2023    3:42 PM  Depression screen PHQ 2/9  Decreased Interest 0 1 1 0 1  Down, Depressed, Hopeless 0 1 0 0 0  PHQ - 2 Score 0 2 1 0 1  Altered sleeping  1 1 1 3   Tired, decreased energy  2 1 1    Change in appetite  1 1 1 1   Feeling bad or failure about yourself   0 0 0   Trouble concentrating  0 0 0 0  Moving slowly or fidgety/restless  0 0 0 0  Suicidal thoughts  0 0 0 0  PHQ-9 Score  6 4 3 5       11/19/2023    3:30 PM 11/07/2023    5:08 PM 09/05/2023    4:18 PM 05/14/2023    3:42 PM  GAD 7 : Generalized Anxiety Score  Nervous, Anxious, on Edge 0 1 0 0  Control/stop  worrying 1 1 1 1   Worry too much - different things 2 1 1 1   Trouble relaxing 1 1 1 1   Restless 0 0 0 0  Easily annoyed or irritable 1 0 1 1  Afraid - awful might happen 0 0 0 0  Total GAD 7 Score 5 4 4  4

## 2023-12-11 ENCOUNTER — Encounter: Payer: Self-pay | Admitting: Family Medicine

## 2023-12-17 ENCOUNTER — Ambulatory Visit (INDEPENDENT_AMBULATORY_CARE_PROVIDER_SITE_OTHER): Payer: 59

## 2023-12-17 VITALS — BP 113/77 | HR 80 | Temp 98.9°F | Resp 18 | Ht 64.0 in | Wt 198.0 lb

## 2023-12-17 DIAGNOSIS — D509 Iron deficiency anemia, unspecified: Secondary | ICD-10-CM

## 2023-12-17 DIAGNOSIS — D500 Iron deficiency anemia secondary to blood loss (chronic): Secondary | ICD-10-CM

## 2023-12-17 MED ORDER — IRON SUCROSE 20 MG/ML IV SOLN
200.0000 mg | Freq: Once | INTRAVENOUS | Status: AC
  Administered 2023-12-17: 200 mg via INTRAVENOUS
  Filled 2023-12-17: qty 10

## 2023-12-17 MED ORDER — ACETAMINOPHEN 325 MG PO TABS
650.0000 mg | ORAL_TABLET | Freq: Once | ORAL | Status: AC
  Administered 2023-12-17: 650 mg via ORAL
  Filled 2023-12-17: qty 2

## 2023-12-17 MED ORDER — DIPHENHYDRAMINE HCL 25 MG PO CAPS
25.0000 mg | ORAL_CAPSULE | Freq: Once | ORAL | Status: AC
  Administered 2023-12-17: 25 mg via ORAL
  Filled 2023-12-17: qty 1

## 2023-12-17 NOTE — Progress Notes (Signed)
 Diagnosis: Iron Deficiency Anemia  Provider:  Chilton Greathouse MD  Procedure: IV Push  IV Type: Peripheral, IV Location: L Antecubital  Venofer (Iron Sucrose), Dose: 200 mg  Post Infusion IV Care: Observation period completed and Peripheral IV Discontinued  Discharge: Condition: Stable, Destination: Home . AVS Declined  Performed by:  Wyvonne Lenz, RN

## 2023-12-17 NOTE — Patient Instructions (Signed)

## 2023-12-18 ENCOUNTER — Other Ambulatory Visit (HOSPITAL_COMMUNITY): Payer: Self-pay

## 2023-12-19 ENCOUNTER — Ambulatory Visit (INDEPENDENT_AMBULATORY_CARE_PROVIDER_SITE_OTHER): Payer: 59 | Admitting: Clinical

## 2023-12-19 ENCOUNTER — Other Ambulatory Visit: Payer: Self-pay

## 2023-12-19 DIAGNOSIS — F4323 Adjustment disorder with mixed anxiety and depressed mood: Secondary | ICD-10-CM

## 2023-12-19 DIAGNOSIS — F4321 Adjustment disorder with depressed mood: Secondary | ICD-10-CM

## 2023-12-19 DIAGNOSIS — Z658 Other specified problems related to psychosocial circumstances: Secondary | ICD-10-CM

## 2023-12-21 ENCOUNTER — Emergency Department (HOSPITAL_COMMUNITY)
Admission: EM | Admit: 2023-12-21 | Discharge: 2023-12-21 | Disposition: A | Discharge status: Home / Self Care | Attending: Emergency Medicine | Admitting: Emergency Medicine

## 2023-12-21 ENCOUNTER — Emergency Department (HOSPITAL_COMMUNITY)

## 2023-12-21 ENCOUNTER — Encounter (HOSPITAL_COMMUNITY): Payer: Self-pay | Admitting: *Deleted

## 2023-12-21 ENCOUNTER — Other Ambulatory Visit: Payer: Self-pay

## 2023-12-21 DIAGNOSIS — G43909 Migraine, unspecified, not intractable, without status migrainosus: Secondary | ICD-10-CM | POA: Diagnosis not present

## 2023-12-21 DIAGNOSIS — R0789 Other chest pain: Secondary | ICD-10-CM | POA: Diagnosis not present

## 2023-12-21 DIAGNOSIS — Z7901 Long term (current) use of anticoagulants: Secondary | ICD-10-CM | POA: Insufficient documentation

## 2023-12-21 DIAGNOSIS — Z87891 Personal history of nicotine dependence: Secondary | ICD-10-CM | POA: Insufficient documentation

## 2023-12-21 DIAGNOSIS — J45909 Unspecified asthma, uncomplicated: Secondary | ICD-10-CM | POA: Insufficient documentation

## 2023-12-21 DIAGNOSIS — I10 Essential (primary) hypertension: Secondary | ICD-10-CM | POA: Diagnosis not present

## 2023-12-21 DIAGNOSIS — R079 Chest pain, unspecified: Secondary | ICD-10-CM | POA: Diagnosis not present

## 2023-12-21 DIAGNOSIS — R0602 Shortness of breath: Secondary | ICD-10-CM | POA: Diagnosis not present

## 2023-12-21 HISTORY — DX: Other pulmonary embolism without acute cor pulmonale: I26.99

## 2023-12-21 LAB — BASIC METABOLIC PANEL
Anion gap: 9 (ref 5–15)
BUN: 12 mg/dL (ref 6–20)
CO2: 22 mmol/L (ref 22–32)
Calcium: 9 mg/dL (ref 8.9–10.3)
Chloride: 106 mmol/L (ref 98–111)
Creatinine, Ser: 0.96 mg/dL (ref 0.44–1.00)
GFR, Estimated: >60 mL/min (ref >60)
Glucose, Bld: 71 mg/dL (ref 70–99)
Potassium: 3.5 mmol/L (ref 3.5–5.1)
Sodium: 137 mmol/L (ref 135–145)

## 2023-12-21 LAB — CBC
HCT: 42.2 % (ref 36.0–46.0)
Hemoglobin: 13.4 g/dL (ref 12.0–15.0)
MCH: 29.1 pg (ref 26.0–34.0)
MCHC: 31.8 g/dL (ref 30.0–36.0)
MCV: 91.7 fL (ref 80.0–100.0)
Platelets: 244 10*3/uL (ref 150–400)
RBC: 4.6 MIL/uL (ref 3.87–5.11)
RDW: 14.2 % (ref 11.5–15.5)
WBC: 7.1 10*3/uL (ref 4.0–10.5)
nRBC: 0 % (ref 0.0–0.2)

## 2023-12-21 LAB — TROPONIN I (HIGH SENSITIVITY)
Troponin I (High Sensitivity): 4 ng/L (ref <18)
Troponin I (High Sensitivity): 5 ng/L (ref <18)

## 2023-12-21 LAB — HCG, SERUM, QUALITATIVE: Preg, Serum: NEGATIVE

## 2023-12-21 LAB — D-DIMER, QUANTITATIVE: D-Dimer, Quant: <0.27 ug{FEU}/mL (ref 0.00–0.50)

## 2023-12-21 MED ORDER — ACETAMINOPHEN 325 MG PO TABS
650.0000 mg | ORAL_TABLET | Freq: Once | ORAL | Status: AC
  Administered 2023-12-21: 650 mg via ORAL
  Filled 2023-12-21: qty 2

## 2023-12-21 MED ORDER — PROCHLORPERAZINE EDISYLATE 10 MG/2ML IJ SOLN
10.0000 mg | Freq: Once | INTRAMUSCULAR | Status: AC
  Administered 2023-12-21: 10 mg via INTRAVENOUS
  Filled 2023-12-21: qty 2

## 2023-12-21 MED ORDER — ALUM & MAG HYDROXIDE-SIMETH 200-200-20 MG/5ML PO SUSP
30.0000 mL | Freq: Once | ORAL | Status: AC
  Administered 2023-12-21: 30 mL via ORAL
  Filled 2023-12-21: qty 30

## 2023-12-21 MED ORDER — LACTATED RINGERS IV BOLUS
1000.0000 mL | Freq: Once | INTRAVENOUS | Status: AC
  Administered 2023-12-21: 1000 mL via INTRAVENOUS

## 2023-12-21 NOTE — ED Triage Notes (Signed)
 PT here pov c/o chest pain and sob since Thursday.  Pt is taking eliquis 2 x's per day for PE in R lung. SOB increases with ambulation.  Pain is reproducible. Also c/o headache.

## 2023-12-21 NOTE — ED Provider Notes (Signed)
 [place] EMERGENCY DEPARTMENT AT [facility] Provider Note  Arrival date/time:12/21/2023 5:02 PM  HPI/ROS   Barbara Dunn is a 53 y.o. female with PMH significant for migraine, HTN, SVT, asthma, DVTs on Eliquis, MGUS who presents for chest pain and migraine.  History is provided by patient.  Patient is been experiencing chest pain starting 1 to 2 days ago.  Started intermittent described as shooting pain.  Today has become constant.  No precipitating factors.  Not associated with any time of the day.  Also endorses a migraine headache onset yesterday that has not improved with her home migraine medications or Tylenol.  No neurologic symptoms. Denies associated nausea, emesis, diaphoresis. Has never experienced similar chest pain before. Describes headache as similar to prior migraines.  A complete ROS was performed with pertinent positives/negatives noted above.   ED Course and Medical Decision Making   I personally reviewed the patient's vitals.  ER Provider interpretation of labs: Troponins negative x 2 hCG negative D-dimer undetectable BMP shows no metabolic treatment or AKI CBC shows no leukocytosis or anemia  Assessment/Plan: 53 yo patient presenting for chest pain.  EKG without ischemic changes.  Troponin negative x 2.  Considering acute coronary syndrome, patient has an HEAR score of 2.  Chest x-ray without signs of infiltrate, edema, soft tissue injury or pneumothorax  Considering atypical presentation of pulmonary embolism, patient does have a history of pulmonary embolism, takes Eliquis daily and no recent missed doses. D-dimer here was undetectable.  History and physical exam not suggestive of aortic dissection. No typical symptoms of chest pain radiating through to the back or other parts of the body.  No severe hypertension.  Symmetric bilateral radial pulses.  No associated neurologic deficits.  No associated abdominal or lower extremity symptoms.  No  marfanoid features or evidence of underlying connective tissue disorder. With no signs of mediastinal widening on chest X-ray, and otherwise low clinical suspicion, will not pursue diagnosis further.  No tenderness on abdominal palpation to suggest referred pain from intra-abdominal pathology  Was given migraine cocktail as well as GI cocktail.  On reevaluation, her pain has greatly improved.  I discussed reassuring workup with patient and plan to follow-up with PCP and cardiology.  She voiced agreement and understanding.   Disposition:  I discussed the plan for discharge with the patient and/or their surrogate at bedside prior to discharge and they were in agreement with the plan and verbalized understanding of the return precautions provided. All questions answered to the best of my ability. Ultimately, the patient was discharged in stable condition with stable vital signs. I am reassured that they are capable of close follow up and good social support at home.   Clinical Impression:  1. Chest pain, unspecified type     Rx / DC Orders ED Discharge Orders          Ordered    Ambulatory referral to Cardiology       Comments: If you have not heard from the Cardiology office within the next 72 hours please call [phone].   12/21/23 1658            The plan for this patient was discussed with Dr. Doran Durand, who voiced agreement and who oversaw evaluation and treatment of this patient.   Clinical Complexity A medically appropriate history, review of systems, and physical exam was performed.  Patient's presentation is most consistent with acute presentation with potential threat to life or bodily function.  Medical Decision Making Amount  and/or Complexity of Data Reviewed Labs: ordered. Radiology: ordered.  Risk OTC drugs. Prescription drug management.    Physical Exam and Medical History   Vitals:   12/21/23 1243 12/21/23 1245 12/21/23 1630  BP: 113/85  (!)  134/92  Pulse: 83  64  Resp: 16  18  Temp: 98.6 F (37 C)    TempSrc: Oral    SpO2: 100%  100%  Weight:  89.8 kg   Height:  5\' 4"  (1.626 m)     Physical Exam Vitals and nursing note reviewed.  Constitutional:      General: She is not in acute distress.    Appearance: She is well-developed.  HENT:     Head: Normocephalic and atraumatic.  Eyes:     Conjunctiva/sclera: Conjunctivae normal.  Cardiovascular:     Rate and Rhythm: Normal rate and regular rhythm.     Heart sounds: No murmur heard. Pulmonary:     Effort: Pulmonary effort is normal. No respiratory distress.     Breath sounds: Normal breath sounds.  Abdominal:     Palpations: Abdomen is soft.     Tenderness: There is no abdominal tenderness.  Musculoskeletal:        General: No swelling.     Cervical back: Neck supple.  Skin:    General: Skin is warm and dry.     Capillary Refill: Capillary refill takes less than 2 seconds.  Neurological:     Mental Status: She is alert.  Psychiatric:        Mood and Affect: Mood normal.     Medical History: No Known Allergies Past Medical History:  Diagnosis Date   Anemia    Arthritis    Asthma    Bronchitis    Chronic kidney disease    DVT (deep venous thrombosis) (HCC)    Hypertension    Kidney stone    Migraines    Obesity    Pulmonary embolism (HCC)     Past Surgical History:  Procedure Laterality Date   CARDIAC CATHETERIZATION N/A 08/02/2015   Procedure: Left Heart Cath and Coronary Angiography;  Surgeon: Lennette Bihari, MD;  Location: [facility] INVASIVE CV LAB;  Service: Cardiovascular;  Laterality: N/A;   CERVIX LESION DESTRUCTION     TUBAL LIGATION     TUBAL LIGATION     Family History  Problem Relation Age of Onset   Hypertension Mother    Schizophrenia Mother    Schizophrenia Sister    Schizophrenia Brother    Schizophrenia Brother    Schizophrenia Son    Colon cancer Neg Hx    Esophageal cancer Neg Hx    Colon polyps Neg Hx    Rectal cancer Neg Hx     Stomach cancer Neg Hx     Social History   Tobacco Use   Smoking status: Former    Current packs/day: 0.00    Types: Cigarettes    Quit date: 12/15/2003    Years since quitting: 20.0   Smokeless tobacco: Never   Tobacco comments:    quit 10 years ago  Vaping Use   Vaping status: Never Used  Substance Use Topics   Alcohol use: Yes    Alcohol/week: 2.0 standard drinks of alcohol    Types: 1 Glasses of wine, 1 Shots of liquor per week    Comment: Once every 3 months    Drug use: No    Procedures   If procedures were preformed on this patient, they are listed below:  Procedures   -------- HPI and MDM generated using voice dictation software and may contain dictation errors. Please contact me for any clarification or with any questions.   Cephus Slater, MD Emergency Medicine PGY-2    Caron Presume, MD 12/21/23 Benjaman Lobe    Glyn Ade, MD 12/21/23 [phone]

## 2023-12-21 NOTE — ED Provider Triage Note (Signed)
 Emergency Medicine Provider Triage Evaluation Note  Barbara Dunn , a 53 y.o. female  was evaluated in triage.  Pt complains of chest pain x2 days, sharp and associated with SOB. Worse with ROM and laying flat. Reports "mostly" being compliant with eliquis. Also reports headache.   Review of Systems  Positive: Chest pain, SOB Negative: N/v  Physical Exam  BP 113/85   Pulse 83   Temp 98.6 F (37 C) (Oral)   Resp 16   Ht 5\' 4"  (1.626 m)   Wt 89.8 kg   LMP 12/21/2023   SpO2 100%   BMI 33.99 kg/m  Gen:   Awake, no distress   Resp:  Normal effort  MSK:   Moves extremities without difficulty  Other:  Ttp of chest wall  Medical Decision Making  Medically screening exam initiated at 1:12 PM.  Appropriate orders placed.  Barbara Dunn was informed that the remainder of the evaluation will be completed by another provider, this initial triage assessment does not replace that evaluation, and the importance of remaining in the ED until their evaluation is complete.     Pete Pelt, [place] 12/21/23 1314

## 2023-12-21 NOTE — Discharge Instructions (Signed)
 Barbara Dunn:  Thank you for allowing [place] to take care of you today.  We hope you begin feeling better soon.  To-Do: Please follow-up with cardiology.  Please call their office directly at [phone]  to schedule an appointment. Please return to the Emergency Department or call 911 if you experience chest pain, shortness of breath, severe pain, severe fever, altered mental status, or have any reason to think that you need emergency medical care.  Thank you again.  Hope you feel better soon.  Department of Emergency Medicine [facility]

## 2023-12-23 NOTE — BH Specialist Note (Deleted)
 Integrated Behavioral Health Follow Up In-Person Visit  MRN: [id number] Name: Barbara Dunn  Number of Integrated Behavioral Health Clinician visits: 3- Third Visit  Session Start time: 1400   Session End time: 1452  Total time in minutes: 52   Types of Service: Individual psychotherapy  Interpretor:No. Interpretor Name and Language: n/a  Subjective: Barbara Dunn is a 53 y.o. female accompanied by {Patient accompanied by:[id number]} Patient was referred by Lorriane Shire, MD for ***. Patient reports the following symptoms/concerns: *** Duration of problem: ***; Severity of problem: {Mild/Moderate/Severe:20260}  Objective: Mood: {BHH MOOD:22306} and Affect: {BHH AFFECT:22307} Risk of harm to self or others: {CHL AMB BH Suicide Current Mental Status:21022748}  Life Context: Family and Social: *** School/Work: *** Self-Care: *** Life Changes: ***  Patient and/or Family's Strengths/Protective Factors: {CHL AMB BH PROTECTIVE [phone]}  Goals Addressed: Patient will:  Reduce symptoms of: {IBH Symptoms:21014056}   Increase knowledge and/or ability of: {IBH Patient Tools:21014057}   Demonstrate ability to: {IBH Goals:21014053}  Progress towards Goals: {CHL AMB BH PROGRESS TOWARDS GOALS:[phone]}  Interventions: Interventions utilized:  {IBH Interventions:21014054} Standardized Assessments completed: {IBH Screening Tools:21014051}  Patient and/or Family Response: Patient agrees with treatment plan.   Patient Centered Plan: Patient is on the following Treatment Plan(s): IBH Assessment: Patient currently experiencing ***.   Patient may benefit from continued therapeutic intervention *** .  Plan: Follow up with behavioral health clinician on : *** Behavioral recommendations:  -*** -*** Referral(s): {IBH Referrals:21014055} "From scale of 1-10, how likely are you to follow plan?": ***  Valetta Close Yasiel Goyne, LCSW     12/25/2023    5:39 PM 12/24/2023     1:34 PM 12/17/2023    1:36 PM 11/19/2023    3:30 PM 11/07/2023    5:08 PM  Depression screen PHQ 2/9  Decreased Interest 0 0 0 1 1  Down, Depressed, Hopeless 0 0 0 1 0  PHQ - 2 Score 0 0 0 2 1  Altered sleeping 1   1 1   Tired, decreased energy 1   2 1   Change in appetite 1   1 1   Feeling bad or failure about yourself  0   0 0  Trouble concentrating 0   0 0  Moving slowly or fidgety/restless 0   0 0  Suicidal thoughts 0   0 0  PHQ-9 Score 3   6 4       12/25/2023    5:40 PM 11/19/2023    3:30 PM 11/07/2023    5:08 PM 09/05/2023    4:18 PM  GAD 7 : Generalized Anxiety Score  Nervous, Anxious, on Edge 0 0 1 0  Control/stop worrying 1 1 1 1   Worry too much - different things 1 2 1 1   Trouble relaxing 1 1 1 1   Restless 0 0 0 0  Easily annoyed or irritable 0 1 0 1  Afraid - awful might happen 0 0 0 0  Total GAD 7 Score 3 5 4  4

## 2023-12-24 ENCOUNTER — Ambulatory Visit (INDEPENDENT_AMBULATORY_CARE_PROVIDER_SITE_OTHER): Payer: 59

## 2023-12-24 VITALS — BP 110/73 | HR 66 | Temp 98.8°F | Resp 16 | Ht 62.0 in | Wt 195.4 lb

## 2023-12-24 DIAGNOSIS — D509 Iron deficiency anemia, unspecified: Secondary | ICD-10-CM

## 2023-12-24 DIAGNOSIS — D500 Iron deficiency anemia secondary to blood loss (chronic): Secondary | ICD-10-CM

## 2023-12-24 MED ORDER — DIPHENHYDRAMINE HCL 25 MG PO CAPS
25.0000 mg | ORAL_CAPSULE | Freq: Once | ORAL | Status: AC
  Administered 2023-12-24: 25 mg via ORAL
  Filled 2023-12-24: qty 1

## 2023-12-24 MED ORDER — ACETAMINOPHEN 325 MG PO TABS
650.0000 mg | ORAL_TABLET | Freq: Once | ORAL | Status: AC
  Administered 2023-12-24: 650 mg via ORAL
  Filled 2023-12-24: qty 2

## 2023-12-24 MED ORDER — IRON SUCROSE 20 MG/ML IV SOLN
200.0000 mg | Freq: Once | INTRAVENOUS | Status: AC
Start: 2023-12-24 — End: 2023-12-24
  Administered 2023-12-24: 200 mg via INTRAVENOUS
  Filled 2023-12-24: qty 10

## 2023-12-24 NOTE — Progress Notes (Signed)
 Diagnosis: Iron Deficiency Anemia  Provider:  Chilton Greathouse MD  Procedure: IV Push  IV Type: Peripheral, IV Location: L Antecubital  Venofer (Iron Sucrose), Dose: 200 mg  Post Infusion IV Care: Observation period completed and Peripheral IV Discontinued  Discharge: Condition: Good, Destination: Home . AVS Provided  Performed by:  Loney Hering, LPN

## 2023-12-25 ENCOUNTER — Other Ambulatory Visit: Payer: Self-pay

## 2023-12-25 ENCOUNTER — Other Ambulatory Visit (HOSPITAL_COMMUNITY)
Admission: RE | Admit: 2023-12-25 | Discharge: 2023-12-25 | Disposition: A | Discharge status: Home / Self Care | Source: Ambulatory Visit | Attending: Obstetrics and Gynecology | Admitting: Obstetrics and Gynecology

## 2023-12-25 ENCOUNTER — Ambulatory Visit (INDEPENDENT_AMBULATORY_CARE_PROVIDER_SITE_OTHER): Payer: 59 | Admitting: Obstetrics and Gynecology

## 2023-12-25 VITALS — BP 112/82 | HR 80 | Wt 193.2 lb

## 2023-12-25 DIAGNOSIS — N939 Abnormal uterine and vaginal bleeding, unspecified: Secondary | ICD-10-CM | POA: Diagnosis not present

## 2023-12-25 NOTE — Progress Notes (Signed)
 GYNECOLOGY VISIT  Patient name: Barbara Dunn MRN [id number]  Date of birth: [birth year] Chief Complaint:   Procedure  History:  Barbara Dunn is a 53 y.o. No obstetric history on file. being seen today for EMB.  Menses re-started today, feels it occurred after lifting something heavy because she finished menses not too log ago.   Past Medical History:  Diagnosis Date   Anemia    Arthritis    Asthma    Bronchitis    Chronic kidney disease    DVT (deep venous thrombosis) (HCC)    Hypertension    Kidney stone    Migraines    Obesity    Pulmonary embolism [facility])     Past Surgical History:  Procedure Laterality Date   CARDIAC CATHETERIZATION N/A 08/02/2015   Procedure: Left Heart Cath and Coronary Angiography;  Surgeon: Lennette Bihari, MD;  Location: [facility] INVASIVE CV LAB;  Service: Cardiovascular;  Laterality: N/A;   CERVIX LESION DESTRUCTION     TUBAL LIGATION     TUBAL LIGATION      The following portions of the patient's history were reviewed and updated as appropriate: allergies, current medications, past family history, past medical history, past social history, past surgical history and problem list.   Health Maintenance:   Last pap     Component Value Date/Time   DIAGPAP  11/20/2022 0855    - Negative for Intraepithelial Lesions or Malignancy (NILM)   DIAGPAP - Benign reactive/reparative changes 11/20/2022 0855   DIAGPAP  11/02/2019 0936    - Negative for Intraepithelial Lesions or Malignancy (NILM)   DIAGPAP  11/02/2019 0936    - Benign reactive/reparative changes including parakeratosis   HPVHIGH Negative 11/20/2022 0855   HPVHIGH Negative 11/02/2019 0936   ADEQPAP  11/20/2022 0855    Satisfactory for evaluation; transformation zone component PRESENT.   ADEQPAP  11/02/2019 0936    Satisfactory for evaluation; transformation zone component PRESENT.   ADEQPAP  10/09/2018 0000    Satisfactory for evaluation  endocervical/transformation zone component PRESENT.     High Risk HPV: Positive  Adequacy:  Satisfactory for evaluation, transformation zone component PRESENT  Diagnosis:  Atypical squamous cells of undetermined significance (ASC-US)  Last mammogram: 11/2023 BIRADS 1   Review of Systems:  Pertinent items are noted in HPI. Comprehensive review of systems was otherwise negative.   Objective:  Physical Exam BP 112/82   Pulse 80   Wt 193 lb 3.2 oz (87.6 kg)   LMP 12/25/2023 (Exact Date)   BMI 35.34 kg/m    Physical Exam Vitals and nursing note reviewed. Exam conducted with a chaperone present.  Constitutional:      Appearance: Normal appearance.  HENT:     Head: Normocephalic and atraumatic.  Pulmonary:     Effort: Pulmonary effort is normal.     Breath sounds: Normal breath sounds.  Genitourinary:    General: Normal vulva.     Exam position: Lithotomy position.     Vagina: Normal.     Cervix: Normal.  Skin:    General: Skin is warm and dry.  Neurological:     General: No focal deficit present.     Mental Status: She is alert.  Psychiatric:        Mood and Affect: Mood normal.        Behavior: Behavior normal.        Thought Content: Thought content normal.        Judgment: Judgment normal.  Labs and Imaging Pelvic US 10/07/2023 FINDINGS: The uterus is anteverted in position and measures 8.5 x 4.4 x 5.4 cm. It demonstrates a heterogeneous echotexture with a 2.3 cm right fundal fibroid. The endometrium measures 0.4 cm and demonstrates a normal homogeneous echotexture.   The right ovary measures 3.2 x 2.3 x 2.7 cm and demonstrates a normal echotexture. There is normal color Doppler flow.   The left ovary measures 3.4 x 1.2 x 2.6 cm and demonstrates a normal echotexture. There is normal color Doppler flow.   There is no fluid present within the cul-de-sac.   IMPRESSION: 1.  Heterogeneous echotexture with a 2.3 cm right fundal fibroid.   Thank you for allowing [place] to assist in the care of this  patient.     Electronically Signed   By: Lestine Box M.D.   On: 10/08/2023 08:26   Endometrial Biopsy Procedure  Patient identified, informed consent performed,  indication reviewed, consent signed.  Reviewed risk of perforation, pain, bleeding, insufficient sample, etc were reviewd. Time out was performed.  Urine pregnancy test negative.  Speculum placed in the vagina.  Cervix visualized.  Cleaned with Betadine x 2.  Anterior cervix grasped anteriorly with a single tooth tenaculum.  Paracervical block was not administered.  Endometrial pipelle was used to draw up 1cc of 1% lidocaine, introduced into the cervical os and instilled into the endometrial cavity.  The pipelle was passed twice without difficulty and sample obtained. Tenaculum was removed, good hemostasis noted.  Patient tolerated procedure well.  Patient was given post-procedure instructions.     Assessment & Plan:   1. Abnormal uterine bleeding (AUB) (Primary) Now s/p uncomplicated EMB, will follow up results. Briefly reviewed procedural options for AUB including endometrial ablation, [place], and hysterectomy. Noted that medication options are limited given VTE history and doe snot appear to be imminently menopausal.     Routine preventative health maintenance measures emphasized.  Lorriane Shire, MD Minimally Invasive Gynecologic Surgery [facility] for [facility], [facility]

## 2023-12-25 NOTE — Addendum Note (Signed)
 Addended by: Harlon Ditty on: 12/25/2023 02:02 PM   Modules accepted: Orders

## 2023-12-25 NOTE — Patient Instructions (Signed)
 Endometrial ablation - burning the lining of the uterus to slow down periods  Uterine artery embolization (done by interventional radiologists) - cut off the blood supply to the uterus   Hysterectomy - removal of the uterus, cervix and tubes (ovaries stay in place)

## 2023-12-27 ENCOUNTER — Encounter: Payer: Self-pay | Admitting: Obstetrics and Gynecology

## 2023-12-27 LAB — SURGICAL PATHOLOGY

## 2023-12-29 ENCOUNTER — Encounter (HOSPITAL_COMMUNITY): Payer: Self-pay

## 2023-12-29 ENCOUNTER — Emergency Department (HOSPITAL_COMMUNITY)
Admission: EM | Admit: 2023-12-29 | Discharge: 2023-12-29 | Disposition: A | Discharge status: Home / Self Care | Attending: Emergency Medicine | Admitting: Emergency Medicine

## 2023-12-29 ENCOUNTER — Ambulatory Visit (HOSPITAL_COMMUNITY)
Admission: EM | Admit: 2023-12-29 | Discharge: 2023-12-29 | Disposition: A | Discharge status: Home / Self Care | Attending: Family Medicine | Admitting: Family Medicine

## 2023-12-29 ENCOUNTER — Emergency Department (HOSPITAL_COMMUNITY)

## 2023-12-29 ENCOUNTER — Other Ambulatory Visit: Payer: Self-pay

## 2023-12-29 DIAGNOSIS — N760 Acute vaginitis: Secondary | ICD-10-CM | POA: Diagnosis not present

## 2023-12-29 DIAGNOSIS — N72 Inflammatory disease of cervix uteri: Secondary | ICD-10-CM | POA: Diagnosis not present

## 2023-12-29 DIAGNOSIS — K429 Umbilical hernia without obstruction or gangrene: Secondary | ICD-10-CM | POA: Diagnosis not present

## 2023-12-29 DIAGNOSIS — Z7901 Long term (current) use of anticoagulants: Secondary | ICD-10-CM | POA: Diagnosis not present

## 2023-12-29 DIAGNOSIS — N200 Calculus of kidney: Secondary | ICD-10-CM | POA: Diagnosis not present

## 2023-12-29 DIAGNOSIS — R935 Abnormal findings on diagnostic imaging of other abdominal regions, including retroperitoneum: Secondary | ICD-10-CM | POA: Diagnosis not present

## 2023-12-29 DIAGNOSIS — Z87442 Personal history of urinary calculi: Secondary | ICD-10-CM | POA: Diagnosis not present

## 2023-12-29 DIAGNOSIS — R1031 Right lower quadrant pain: Secondary | ICD-10-CM | POA: Diagnosis present

## 2023-12-29 DIAGNOSIS — B9689 Other specified bacterial agents as the cause of diseases classified elsewhere: Secondary | ICD-10-CM | POA: Insufficient documentation

## 2023-12-29 DIAGNOSIS — R109 Unspecified abdominal pain: Secondary | ICD-10-CM | POA: Diagnosis not present

## 2023-12-29 DIAGNOSIS — N83201 Unspecified ovarian cyst, right side: Secondary | ICD-10-CM | POA: Diagnosis not present

## 2023-12-29 DIAGNOSIS — N898 Other specified noninflammatory disorders of vagina: Secondary | ICD-10-CM | POA: Diagnosis not present

## 2023-12-29 DIAGNOSIS — M545 Low back pain, unspecified: Secondary | ICD-10-CM

## 2023-12-29 LAB — POCT URINALYSIS DIP (MANUAL ENTRY)
Bilirubin, UA: NEGATIVE
Blood, UA: NEGATIVE
Glucose, UA: NEGATIVE mg/dL
Ketones, POC UA: NEGATIVE mg/dL
Leukocytes, UA: NEGATIVE
Nitrite, UA: NEGATIVE
Protein Ur, POC: NEGATIVE mg/dL
Spec Grav, UA: 1.02 (ref 1.010–1.025)
Urobilinogen, UA: 2 U/dL — AB
pH, UA: 7 (ref 5.0–8.0)

## 2023-12-29 LAB — URINALYSIS, ROUTINE W REFLEX MICROSCOPIC
Bilirubin Urine: NEGATIVE
Glucose, UA: NEGATIVE mg/dL
Hgb urine dipstick: NEGATIVE
Ketones, ur: 5 mg/dL — AB
Leukocytes,Ua: NEGATIVE
Nitrite: NEGATIVE
Protein, ur: NEGATIVE mg/dL
Specific Gravity, Urine: 1.014 (ref 1.005–1.030)
pH: 7 (ref 5.0–8.0)

## 2023-12-29 LAB — COMPREHENSIVE METABOLIC PANEL
ALT: 17 U/L (ref 0–44)
AST: 25 U/L (ref 15–41)
Albumin: 3.5 g/dL (ref 3.5–5.0)
Alkaline Phosphatase: 43 U/L (ref 38–126)
Anion gap: 10 (ref 5–15)
BUN: 14 mg/dL (ref 6–20)
CO2: 22 mmol/L (ref 22–32)
Calcium: 9.1 mg/dL (ref 8.9–10.3)
Chloride: 108 mmol/L (ref 98–111)
Creatinine, Ser: 0.9 mg/dL (ref 0.44–1.00)
GFR, Estimated: >60 mL/min (ref >60)
Glucose, Bld: 90 mg/dL (ref 70–99)
Potassium: 4.2 mmol/L (ref 3.5–5.1)
Sodium: 140 mmol/L (ref 135–145)
Total Bilirubin: 0.5 mg/dL (ref 0.0–1.2)
Total Protein: 6.6 g/dL (ref 6.5–8.1)

## 2023-12-29 LAB — CBC WITH DIFFERENTIAL/PLATELET
Abs Immature Granulocytes: 0.01 10*3/uL (ref 0.00–0.07)
Basophils Absolute: 0.1 10*3/uL (ref 0.0–0.1)
Basophils Relative: 1 %
Eosinophils Absolute: 0.2 10*3/uL (ref 0.0–0.5)
Eosinophils Relative: 2 %
HCT: 39.8 % (ref 36.0–46.0)
Hemoglobin: 12.6 g/dL (ref 12.0–15.0)
Immature Granulocytes: 0 %
Lymphocytes Relative: 37 %
Lymphs Abs: 2.4 10*3/uL (ref 0.7–4.0)
MCH: 29.7 pg (ref 26.0–34.0)
MCHC: 31.7 g/dL (ref 30.0–36.0)
MCV: 93.9 fL (ref 80.0–100.0)
Monocytes Absolute: 0.7 10*3/uL (ref 0.1–1.0)
Monocytes Relative: 10 %
Neutro Abs: 3.2 10*3/uL (ref 1.7–7.7)
Neutrophils Relative %: 50 %
Platelets: 276 10*3/uL (ref 150–400)
RBC: 4.24 MIL/uL (ref 3.87–5.11)
RDW: 14.3 % (ref 11.5–15.5)
WBC: 6.5 10*3/uL (ref 4.0–10.5)
nRBC: 0 % (ref 0.0–0.2)

## 2023-12-29 LAB — WET PREP, GENITAL
Sperm: NONE SEEN
Trich, Wet Prep: NONE SEEN
WBC, Wet Prep HPF POC: >=10 — AB (ref <10)
Yeast Wet Prep HPF POC: NONE SEEN

## 2023-12-29 LAB — PREGNANCY, URINE: Preg Test, Ur: NEGATIVE

## 2023-12-29 MED ORDER — DOXYCYCLINE HYCLATE 100 MG PO CAPS
100.0000 mg | ORAL_CAPSULE | Freq: Two times a day (BID) | ORAL | 0 refills | Status: DC
Start: 2023-12-29 — End: 2024-02-11

## 2023-12-29 MED ORDER — METRONIDAZOLE 500 MG PO TABS
500.0000 mg | ORAL_TABLET | Freq: Once | ORAL | Status: AC
  Administered 2023-12-29: 500 mg via ORAL
  Filled 2023-12-29: qty 1

## 2023-12-29 MED ORDER — DOXYCYCLINE HYCLATE 100 MG PO TABS
100.0000 mg | ORAL_TABLET | Freq: Once | ORAL | Status: AC
  Administered 2023-12-29: 100 mg via ORAL
  Filled 2023-12-29: qty 1

## 2023-12-29 MED ORDER — OXYCODONE-ACETAMINOPHEN 5-325 MG PO TABS
1.0000 | ORAL_TABLET | Freq: Once | ORAL | Status: AC
  Administered 2023-12-29: 1 via ORAL
  Filled 2023-12-29: qty 1

## 2023-12-29 MED ORDER — METRONIDAZOLE 500 MG PO TABS: 500.0000 mg | ORAL_TABLET | Freq: Two times a day (BID) | ORAL | 0 refills | Status: DC

## 2023-12-29 MED ORDER — KETOROLAC TROMETHAMINE 15 MG/ML IJ SOLN
15.0000 mg | Freq: Once | INTRAMUSCULAR | Status: AC
  Administered 2023-12-29: 15 mg via INTRAVENOUS
  Filled 2023-12-29: qty 1

## 2023-12-29 MED ORDER — IOHEXOL 350 MG/ML SOLN
75.0000 mL | Freq: Once | INTRAVENOUS | Status: AC | PRN
Start: 2023-12-29 — End: 2023-12-29
  Administered 2023-12-29: 75 mL via INTRAVENOUS

## 2023-12-29 MED ORDER — LIDOCAINE HCL (PF) 1 % IJ SOLN
1.0000 mL | Freq: Once | INTRAMUSCULAR | Status: AC
  Administered 2023-12-29: 1 mL
  Filled 2023-12-29: qty 5

## 2023-12-29 MED ORDER — ONDANSETRON HCL 4 MG/2ML IJ SOLN
4.0000 mg | Freq: Once | INTRAMUSCULAR | Status: AC
  Administered 2023-12-29: 4 mg via INTRAVENOUS
  Filled 2023-12-29: qty 2

## 2023-12-29 MED ORDER — OXYCODONE-ACETAMINOPHEN 5-325 MG PO TABS
1.0000 | ORAL_TABLET | Freq: Four times a day (QID) | ORAL | 0 refills | Status: DC | PRN
Start: 2023-12-29 — End: 2024-01-09

## 2023-12-29 MED ORDER — CEFTRIAXONE SODIUM 500 MG IJ SOLR
500.0000 mg | Freq: Once | INTRAMUSCULAR | Status: AC
  Administered 2023-12-29: 500 mg via INTRAMUSCULAR
  Filled 2023-12-29: qty 500

## 2023-12-29 MED ORDER — MORPHINE SULFATE (PF) 4 MG/ML IV SOLN
4.0000 mg | Freq: Once | INTRAVENOUS | Status: AC
  Administered 2023-12-29: 4 mg via INTRAVENOUS
  Filled 2023-12-29: qty 1

## 2023-12-29 NOTE — ED Provider Triage Note (Signed)
 Emergency Medicine Provider Triage Evaluation Note  Barbara Dunn , a 53 y.o. female  was evaluated in triage.  Pt complains of right flank pain.  Patient reports right flank pain that began this morning that radiates to her right lower quadrant.  States her urine looks darker but denies any blood in the urine, anuria, or dysuria.  No fevers, nausea, vomiting, or diarrhea.  History of tubal ligation in the past.  Review of Systems  Positive: Right flank pain Negative: Fevers, nausea, vomiting  Physical Exam  BP (!) 132/97 (BP Location: Right Arm)   Pulse 68   Temp 98.6 F (37 C)   Resp 16   LMP 12/25/2023 (Exact Date)   SpO2 100%  Gen:   Awake, no distress   Resp:  Normal effort  MSK:   Moves extremities without difficulty  Other:  Right flank tenderness  Medical Decision Making  Medically screening exam initiated at 1:49 PM.  Appropriate orders placed.  Barbara Dunn was informed that the remainder of the evaluation will be completed by another provider, this initial triage assessment does not replace that evaluation, and the importance of remaining in the ED until their evaluation is complete.   Barbara Marion, PA-C 12/29/23 1351

## 2023-12-29 NOTE — ED Provider Notes (Addendum)
 [place] [facility]MENT AT [facility] Provider Note   CSN: [id number] Arrival date & time: 12/29/23  1322    History  Chief Complaint  Patient presents with   Flank Pain    Barbara Dunn is a 53 y.o. female here for evaluation of right lower back pain and right lower quadrant pain.  Started this morning.  Has had a mild increase in urinary frequency however denies dysuria, hematuria.  She has a history of kidney stones.  Pain constant, not worse with movement.  Recent cervical biopsy for abnormal uterine bleeding.  She has had pain previously however this significant.  No fever, nausea, vomiting, diarrhea, vaginal bleeding, pelvic pain.  She is no concern for any STIs.  No chest pain, shortness of breath.  HPI     Home Medications Prior to Admission medications   Medication Sig Start Date End Date Taking? Authorizing Provider  doxycycline (VIBRAMYCIN) 100 MG capsule Take 1 capsule (100 mg total) by mouth 2 (two) times daily. 12/29/23  Yes Abyan Cadman A, PA-C  metroNIDAZOLE (FLAGYL) 500 MG tablet Take 1 tablet (500 mg total) by mouth 2 (two) times daily. 12/29/23  Yes Aydon Swamy A, PA-C  oxyCODONE-acetaminophen (PERCOCET/ROXICET) 5-325 MG tablet Take 1 tablet by mouth every 6 (six) hours as needed for severe pain (pain score 7-10). 12/29/23  Yes Avenell Sellers A, PA-C  albuterol (VENTOLIN HFA) 108 (90 Base) MCG/ACT inhaler Inhale 1 puff into the lungs every 6 (six) hours as needed for wheezing and shortness of breath 07/18/22   Hoy Register, MD  amLODipine (NORVASC) 2.5 MG tablet Take 1 tablet (2.5 mg total) by mouth daily. 11/19/23   Hoy Register, MD  apixaban (ELIQUIS) 5 MG TABS tablet Take 1 tablet (5 mg total) by mouth 2 (two) times daily. Start taking after completion of starter pack. 09/30/23   Pervis Hocking B, RPH-CPP  APPLE CIDER VINEGAR PO Take 1 capsule by mouth daily.    [provider]  BIOTIN PO Take 2 tablets by mouth daily.     [provider]  cetirizine (ZYRTEC) 10 MG tablet Take 10 mg by mouth daily.    [provider]  [employer] FIBER GUMMY BEARS CHILDREN PO Take by mouth. Also contains B12, vitamin C, and vitamin D    [provider]  docusate sodium (COLACE) 100 MG capsule Take 100 mg by mouth as needed.     [provider]  ELDERBERRY PO Take by mouth.    [provider]  fluticasone (FLONASE) 50 MCG/ACT nasal spray PLACE 2 SPRAYS INTO BOTH NOSTRILS DAILY. 01/11/20   Hoy Register, MD  fluticasone (FLOVENT HFA) 110 MCG/ACT inhaler INHALE 1 PUFF INTO THE LUNGS 2 (TWO) TIMES DAILY. 07/18/22   Hoy Register, MD  hydrOXYzine (ATARAX) 25 MG tablet Take 1 tablet (25 mg total) by mouth at bedtime as needed. Patient not taking: Reported on 12/25/2023 11/19/23   Hoy Register, MD  Iron, Ferrous Sulfate, 325 (65 Fe) MG TABS Take 325 mg by mouth 2 (two) times daily. 09/18/23   Hoy Register, MD  lisinopril (ZESTRIL) 20 MG tablet Take 1 tablet (20 mg total) by mouth daily. 11/19/23   Hoy Register, MD  Magnesium 250 MG TABS Take 250 mg by mouth daily.    [provider]  meclizine (ANTIVERT) 25 MG tablet Take 1 tablet (25 mg total) by mouth 3 (three) times daily as needed for dizziness. 11/19/23   Hoy Register, MD  Multiple Vitamins-Minerals (MULTIVITAMIN ADULT  PO) Take 1 tablet by mouth daily.     [provider]  oxymetazoline (AFRIN NASAL SPRAY) 0.05 % nasal spray Place 1 spray into both nostrils 2 (two) times daily. 10/31/21   Hoy Register, MD  SUMAtriptan (IMITREX) 100 MG tablet Take 1 tablet (100 mg total) by mouth once for 1 dose at the onset of a migraine. May repeat 1 tablet in 2 hours if migraine persists.  Maximum daily dose 200 mg 11/19/23 11/21/23  Hoy Register, MD  topiramate (TOPAMAX) 50 MG tablet Take 1 tablet (50 mg total) by mouth 2 (two) times daily. 11/19/23   Hoy Register, MD  triamcinolone cream (KENALOG) 0.1 % APPLY 1 APPLICATION TOPICALLY 2 (TWO)  TIMES DAILY. 11/20/22   Hoy Register, MD  vitamin C (ASCORBIC ACID) 250 MG tablet Take 250 mg by mouth daily.    [provider]  diphenhydrAMINE (BENADRYL) 25 mg capsule Take 25 mg by mouth every 6 (six) hours as needed for itching. Reported on 01/23/2016  12/07/19  [provider]      Allergies    Patient has no known allergies.    Review of Systems   Review of Systems  Constitutional: Negative.   HENT: Negative.    Respiratory: Negative.    Cardiovascular: Negative.   Gastrointestinal:  Positive for abdominal pain. Negative for abdominal distention, anal bleeding, blood in stool, constipation, diarrhea, nausea, rectal pain and vomiting.  Genitourinary: Negative.   Musculoskeletal: Negative.   Neurological: Negative.   All other systems reviewed and are negative.   Physical Exam Updated Vital Signs BP 128/80   Pulse 82   Temp 98.6 F (37 C)   Resp 18   Ht 5\' 2"  (1.575 m)   Wt 88.5 kg   LMP 12/20/2023 (Exact Date)   SpO2 100%   BMI 35.67 kg/m  Physical Exam Vitals and nursing note reviewed. Exam conducted with a chaperone present.  Constitutional:      General: She is not in acute distress.    Appearance: She is well-developed. She is not ill-appearing, toxic-appearing or diaphoretic.  HENT:     Head: Normocephalic and atraumatic.     Nose: Nose normal.     Mouth/Throat:     Mouth: Mucous membranes are moist.  Eyes:     Pupils: Pupils are equal, round, and reactive to light.  Cardiovascular:     Rate and Rhythm: Normal rate.     Pulses: Normal pulses.     Heart sounds: Normal heart sounds.  Pulmonary:     Effort: Pulmonary effort is normal. No respiratory distress.     Breath sounds: Normal breath sounds.  Abdominal:     General: Bowel sounds are normal. There is no distension.     Palpations: Abdomen is soft.     Tenderness: There is abdominal tenderness. There is no right CVA tenderness, left CVA tenderness, guarding or rebound.      Comments: Tenderness to RLQ, right lower back  Genitourinary:    Comments: Normal appearing external female genitalia without rashes or lesions, normal vaginal epithelium. Normal appearing cervix with thick light yellow discharge. No cervical petechiae. Cervical os is closed. There is no bleeding noted at the os.no  Odor. Bimanual: Mild CMT,  nontender.  No palpable adnexal masses or tenderness. Uterus midline and not fixed. Rectovaginal exam was deferred.  No cystocele or rectocele noted. No pelvic lymphadenopathy noted. Wet prep was obtained.  Cultures for gonorrhea and chlamydia collected. Exam performed with chaperone in room.  Musculoskeletal:        General: No swelling, tenderness, deformity or signs of injury. Normal range of motion.     Cervical back: Normal range of motion.     Right lower leg: No edema.     Left lower leg: No edema.  Skin:    General: Skin is warm and dry.     Capillary Refill: Capillary refill takes less than 2 seconds.  Neurological:     General: No focal deficit present.     Mental Status: She is alert.  Psychiatric:        Mood and Affect: Mood normal.     ED Results / Procedures / Treatments   Labs (all labs ordered are listed, but only abnormal results are displayed) Labs Reviewed  WET PREP, GENITAL - Abnormal; Notable for the following components:      Result Value   Clue Cells Wet Prep HPF POC PRESENT (*)    WBC, Wet Prep HPF POC >=10 (*)    All other components within normal limits  URINALYSIS, ROUTINE W REFLEX MICROSCOPIC - Abnormal; Notable for the following components:   APPearance HAZY (*)    Ketones, ur 5 (*)    All other components within normal limits  COMPREHENSIVE METABOLIC PANEL  CBC WITH DIFFERENTIAL/PLATELET  PREGNANCY, URINE  POC URINE PREG, ED  GC/CHLAMYDIA PROBE AMP ([place]) NOT AT [facility]    EKG None  Radiology US Pelvis Complete Result Date: 12/29/2023 CLINICAL DATA:  pelvic, rlq pain EXAM: TRANSABDOMINAL AND  TRANSVAGINAL ULTRASOUND OF PELVIS DOPPLER ULTRASOUND OF OVARIES TECHNIQUE: Both transabdominal and transvaginal ultrasound examinations of the pelvis were performed. Transabdominal technique was performed for global imaging of the pelvis including uterus, ovaries, adnexal regions, and pelvic cul-de-sac. It was necessary to proceed with endovaginal exam following the transabdominal exam to visualize the uterus and bilateral ovaries. Color and duplex Doppler ultrasound was utilized to evaluate blood flow to the ovaries. COMPARISON:  CT abdomen pelvis 12/29/2023 FINDINGS: Uterus Measurements: 11.6 x 4.5 x 6.6 cm. Couple intramural uterine fibroids measuring up to 2.6 cm along the anterior right wall. Nabothian cysts. Endometrium Thickness: 9 mm.  No focal abnormality visualized. Right ovary Measurements: 5.1 x 2.7 x 2.6 cm = volume: 18.3 mL. Normal appearance/no adnexal mass. Left ovary Measurements: 3.8 x 1.8 x 2.7 cm = volume: 9.7 mL. Normal appearance/no adnexal mass. Pulsed Doppler evaluation of both ovaries demonstrates normal low-resistance arterial and venous waveforms. Other findings No abnormal free fluid. IMPRESSION: 1. Intramural uterine fibroids. 2. Nabothian cysts. Electronically Signed   By: Tish Frederickson M.D.   On: 12/29/2023 22:03   US Transvaginal Non-OB Result Date: 12/29/2023 CLINICAL DATA:  pelvic, rlq pain EXAM: TRANSABDOMINAL AND TRANSVAGINAL ULTRASOUND OF PELVIS DOPPLER ULTRASOUND OF OVARIES TECHNIQUE: Both transabdominal and transvaginal ultrasound examinations of the pelvis were performed. Transabdominal technique was performed for global imaging of the pelvis including uterus, ovaries, adnexal regions, and pelvic cul-de-sac. It was necessary to proceed with endovaginal exam following the transabdominal exam to visualize the uterus and bilateral ovaries. Color and duplex Doppler ultrasound was utilized to evaluate blood flow to the ovaries. COMPARISON:  CT abdomen pelvis 12/29/2023  FINDINGS: Uterus Measurements: 11.6 x 4.5 x 6.6 cm. Couple intramural uterine fibroids measuring up to 2.6 cm along the anterior right wall. Nabothian cysts. Endometrium Thickness: 9 mm.  No focal abnormality visualized. Right ovary Measurements: 5.1 x 2.7 x 2.6 cm = volume: 18.3 mL. Normal appearance/no adnexal mass. Left ovary Measurements: 3.8 x 1.8  x 2.7 cm = volume: 9.7 mL. Normal appearance/no adnexal mass. Pulsed Doppler evaluation of both ovaries demonstrates normal low-resistance arterial and venous waveforms. Other findings No abnormal free fluid. IMPRESSION: 1. Intramural uterine fibroids. 2. Nabothian cysts. Electronically Signed   By: Tish Frederickson M.D.   On: 12/29/2023 22:03   [place] Art/Ven Flow Abd Pelv Doppler Result Date: 12/29/2023 CLINICAL DATA:  pelvic, rlq pain EXAM: TRANSABDOMINAL AND TRANSVAGINAL ULTRASOUND OF PELVIS DOPPLER ULTRASOUND OF OVARIES TECHNIQUE: Both transabdominal and transvaginal ultrasound examinations of the pelvis were performed. Transabdominal technique was performed for global imaging of the pelvis including uterus, ovaries, adnexal regions, and pelvic cul-de-sac. It was necessary to proceed with endovaginal exam following the transabdominal exam to visualize the uterus and bilateral ovaries. Color and duplex Doppler ultrasound was utilized to evaluate blood flow to the ovaries. COMPARISON:  CT abdomen pelvis 12/29/2023 FINDINGS: Uterus Measurements: 11.6 x 4.5 x 6.6 cm. Couple intramural uterine fibroids measuring up to 2.6 cm along the anterior right wall. Nabothian cysts. Endometrium Thickness: 9 mm.  No focal abnormality visualized. Right ovary Measurements: 5.1 x 2.7 x 2.6 cm = volume: 18.3 mL. Normal appearance/no adnexal mass. Left ovary Measurements: 3.8 x 1.8 x 2.7 cm = volume: 9.7 mL. Normal appearance/no adnexal mass. Pulsed Doppler evaluation of both ovaries demonstrates normal low-resistance arterial and venous waveforms. Other findings No abnormal free fluid.  IMPRESSION: 1. Intramural uterine fibroids. 2. Nabothian cysts. Electronically Signed   By: Tish Frederickson M.D.   On: 12/29/2023 22:03   CT ABDOMEN PELVIS W CONTRAST Result Date: 12/29/2023 CLINICAL DATA:  Right lower quadrant pain EXAM: CT ABDOMEN AND PELVIS WITH CONTRAST TECHNIQUE: Multidetector CT imaging of the abdomen and pelvis was performed using the standard protocol following bolus administration of intravenous contrast. RADIATION DOSE REDUCTION: This exam was performed according to the departmental dose-optimization program which includes automated exposure control, adjustment of the mA and/or kV according to patient size and/or use of iterative reconstruction technique. CONTRAST:  75mL OMNIPAQUE IOHEXOL 350 MG/ML SOLN COMPARISON:  CT abdomen and pelvis 09/16/2023 FINDINGS: Lower chest: No acute abnormality. Hepatobiliary: No focal liver abnormality is seen. No gallstones, gallbladder wall thickening, or biliary dilatation. Pancreas: Unremarkable. No pancreatic ductal dilatation or surrounding inflammatory changes. Spleen: Normal in size without focal abnormality. Adrenals/Urinary Tract: There is a 6 mm calculus in the lower pole of the left kidney. There are rounded hypodensities in both kidneys which are too small to characterize, likely cysts. There is no hydronephrosis or perinephric stranding. The adrenal glands and bladder are within normal limits. Stomach/Bowel: Stomach is within normal limits. Appendix appears normal. No evidence of bowel wall thickening, distention, or inflammatory changes. Vascular/Lymphatic: No significant vascular findings are present. No enlarged abdominal or pelvic lymph nodes. Reproductive: There is a rounded 2.1 cm cyst in the right ovary. The left ovary appears within normal limits. There some fluid in the endometrial canal. Cervix is prominent in size and heterogeneous. Fundal fibroid is likely present measuring 19 mm. Other: There is trace free fluid in the pelvis.  There is a small fat containing umbilical hernia. Musculoskeletal: No fracture is seen. IMPRESSION: 1. 2.1 cm right ovarian cyst.  No follow-up imaging necessary. 2. Trace free fluid in the pelvis. 3. Nonobstructing left renal calculus. 4. Prominent cervix with heterogeneous appearance. Correlate clinically for cervicitis. Consider ultrasound. 5. Probable fundal fibroid. Aortic Atherosclerosis (ICD10-I70.0). Electronically Signed   By: Darliss Cheney M.D.   On: 12/29/2023 19:31    Procedures Procedures  Medications Ordered in ED Medications  doxycycline (VIBRA-TABS) tablet 100 mg (has no administration in time range)  metroNIDAZOLE (FLAGYL) tablet 500 mg (has no administration in time range)  cefTRIAXone (ROCEPHIN) injection 500 mg (has no administration in time range)  lidocaine (PF) (XYLOCAINE) 1 % injection 1-2.1 mL (has no administration in time range)  oxyCODONE-acetaminophen (PERCOCET/ROXICET) 5-325 MG per tablet 1 tablet (1 tablet Oral Given 12/29/23 1405)  iohexol (OMNIPAQUE) 350 MG/ML injection 75 mL (75 mLs Intravenous Contrast Given 12/29/23 1911)  morphine (PF) 4 MG/ML injection 4 mg (4 mg Intravenous Given 12/29/23 1958)  ondansetron (ZOFRAN) injection 4 mg (4 mg Intravenous Given 12/29/23 1958)  ketorolac (TORADOL) 15 MG/ML injection 15 mg (15 mg Intravenous Given 12/29/23 1958)    ED Course/ Medical Decision Making/ A&P   53 year old here for evaluation of right lower quadrant pain.  Began this morning.  No hematuria, dysuria no fever, nausea vomiting, diarrhea.  No concern for any STIs.  Did recently have a cervix biopsy a few days ago for abnormal uterine bleeding.  Does have a history of kidney stones.  Initially seen by urgent care, sent over for imaging.  Labs and imaging personally viewed and interpreted:  CBC without leukocytosis Metabolic panel without significant abnormality Pregnancy test negative UA negative for infection CT abdomen pelvis shows right ovarian cyst,  prominent cervix correlate for cervicitis [place] neg for torsion  Will give additional pain control here.  We discussed labs and imaging.  She has no concern for any STI however given recent biopsy will plan on pelvic exam, ultrasound.  Will obtain cultures.  GU exam with yellow discharge, mild CMT tenderness, no adnexal tenderness.  Given recent instrumentation will treat for infectious process.  Given Rocephin here.  She is positive for BV.  Will treat with Flagyl.  Will have her follow-up with GYN, return for new or worsening symptoms  The patient has been appropriately medically screened and/or stabilized in the ED. I have low suspicion for any other emergent medical condition which would require further screening, evaluation or treatment in the ED or require inpatient management.  Patient is hemodynamically stable and in no acute distress.  Patient able to ambulate in department prior to ED.  Evaluation does not show acute pathology that would require ongoing or additional emergent interventions while in the emergency department or further inpatient treatment.  I have discussed the diagnosis with the patient and answered all questions.  Pain is been managed while in the emergency department and patient has no further complaints prior to discharge.  Patient is comfortable with plan discussed in room and is stable for discharge at this time.  I have discussed strict return precautions for returning to the emergency department.  Patient was encouraged to follow-up with PCP/specialist refer to at discharge.                                 Medical Decision Making Amount and/or Complexity of Data Reviewed External Data Reviewed: labs, radiology and notes. Labs: ordered. Decision-making details documented in ED Course. Radiology: ordered and independent interpretation performed. Decision-making details documented in ED Course.  Risk OTC drugs. Prescription drug management. Parenteral controlled  substances. Decision regarding hospitalization. Diagnosis or treatment significantly limited by social determinants of health.    Final Clinical Impression(s) / ED Diagnoses Final diagnoses:  Cervicitis  Bacterial vaginosis    Rx / DC Orders ED Discharge Orders  Ordered    oxyCODONE-acetaminophen (PERCOCET/ROXICET) 5-325 MG tablet  Every 6 hours PRN        12/29/23 2311    doxycycline (VIBRAMYCIN) 100 MG capsule  2 times daily        12/29/23 2311    metroNIDAZOLE (FLAGYL) 500 MG tablet  2 times daily        12/29/23 2311                Argil Mahl A, PA-C 12/29/23 2316    Tegeler, Canary Brim, MD 12/29/23 2321

## 2023-12-29 NOTE — ED Triage Notes (Addendum)
 Patient reports right flank, right lower back, and urinary frequency since this morning.  Patient has not had any medications for her symptoms.  Patient states she has a known kidney stone.

## 2023-12-29 NOTE — ED Provider Notes (Signed)
 [facility]    CSN: [id number] Arrival date & time: 12/29/23  1203      History   Chief Complaint Chief Complaint  Patient presents with   Back Pain   Flank Pain   Urinary Frequency    HPI Barbara Dunn is a 53 y.o. female.   HPI  Patient presents today with concerns for right flank pain along with right lower back pain and increased urinary frequency since this morning. She reports she is in a lot of pain - she states it is 10/10 in severity She states pain is present no matter her position and hurts worse when she lays down  She reports pain is predominantly in lower back and side. She denies radiation to anterior or abdominal pain, suprapubic pain She reports she has had pain in this area before but never this bad She denies injuries to the area, falls, trauma  She reports she has had tingling in her feet but states this was present prior to the back pain   She states she has not taken anything for pain     Chart review: Review of patient's imaging demonstrates nonobstructive renal calculus on the left as of 09/16/2023 on CT venogram of abdomen pelvis and lower extremity.  She did have CT renal study on 12/21/2021 which demonstrated 4 mm calculus in the inferior pole of left kidney as well as punctate calculus in the inferior pole of the right kidney.  Past Medical History:  Diagnosis Date   Anemia    Arthritis    Asthma    Bronchitis    Chronic kidney disease    DVT (deep venous thrombosis) (HCC)    Hypertension    Kidney stone    Migraines    Obesity    Pulmonary embolism [facility])     Patient Active Problem List   Diagnosis Date Noted   Acute deep vein thrombosis (DVT) of femoral vein of left lower extremity (HCC) 09/30/2023   Single subsegmental pulmonary embolism without acute cor pulmonale (HCC) 09/30/2023   MGUS (monoclonal gammopathy of unknown significance) 06/25/2023   Chronic anticoagulation 03/29/2023   Anemia 03/28/2023   Primary  hypercoagulable state (HCC) 03/24/2023   Acute deep vein thrombosis (DVT) of right peroneal vein (HCC) 02/01/2023   Pulmonary nodule 01/24/2022   Asthma 01/24/2022   Paroxysmal SVT (supraventricular tachycardia) (HCC) 04/21/2020   Personal history of COVID-19 04/21/2020   Seasonal allergies 01/23/2016   Oral contraceptive use 10/19/2015   Non-cardiac chest pain    Palpitations    Abnormal nuclear stress test    Dizziness and giddiness 05/30/2015   Chest pain 05/30/2015   Knee contusion 05/04/2015   Vaginal discharge 02/12/2015   Abnormal uterine bleeding 02/11/2015   Right brachial plexitis 09/03/2014   Shortness of breath 04/20/2014   Pain in left knee 04/20/2014   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   Candidal intertrigo 11/09/2013   Health care maintenance 11/09/2013   Hair loss 09/15/2013   Pain in right shoulder 08/18/2013   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   Hypertension 07/13/2012   Leg pain 03/27/2012   ANEMIA, IRON DEFICIENCY NOS 03/21/2007   Migraine without aura 01/17/2007   OBESITY, NOS 12/19/2006    Past Surgical History:  Procedure Laterality Date   CARDIAC CATHETERIZATION N/A 08/02/2015   Procedure: Left Heart Cath and Coronary Angiography;  Surgeon: Lennette Bihari, MD;  Location: [facility];  Service: Cardiovascular;  Laterality: N/A;   CERVIX LESION DESTRUCTION  TUBAL LIGATION     TUBAL LIGATION      OB History   No obstetric history on file.      Home Medications    Prior to Admission medications   Medication Sig Start Date End Date Taking? Authorizing Provider  albuterol (VENTOLIN HFA) 108 (90 Base) MCG/ACT inhaler Inhale 1 puff into the lungs every 6 (six) hours as needed for wheezing and shortness of breath 07/18/22   Hoy Register, MD  amLODipine (NORVASC) 2.5 MG tablet Take 1 tablet (2.5 mg total) by mouth daily. 11/19/23   Hoy Register, MD  apixaban (ELIQUIS) 5 MG TABS tablet Take 1 tablet (5 mg total) by mouth 2 (two) times  daily. Start taking after completion of starter pack. 09/30/23   Pervis Hocking B, RPH-CPP  APPLE CIDER VINEGAR PO Take 1 capsule by mouth daily.    [provider]  BIOTIN PO Take 2 tablets by mouth daily.     [provider]  cetirizine (ZYRTEC) 10 MG tablet Take 10 mg by mouth daily.    [provider]  [employer] FIBER GUMMY BEARS CHILDREN PO Take by mouth. Also contains B12, vitamin C, and vitamin D    [provider]  docusate sodium (COLACE) 100 MG capsule Take 100 mg by mouth as needed.     [provider]  ELDERBERRY PO Take by mouth.    [provider]  fluticasone (FLONASE) 50 MCG/ACT nasal spray PLACE 2 SPRAYS INTO BOTH NOSTRILS DAILY. 01/11/20   Hoy Register, MD  fluticasone (FLOVENT HFA) 110 MCG/ACT inhaler INHALE 1 PUFF INTO THE LUNGS 2 (TWO) TIMES DAILY. 07/18/22   Hoy Register, MD  hydrOXYzine (ATARAX) 25 MG tablet Take 1 tablet (25 mg total) by mouth at bedtime as needed. Patient not taking: Reported on 12/25/2023 11/19/23   Hoy Register, MD  Iron, Ferrous Sulfate, 325 (65 Fe) MG TABS Take 325 mg by mouth 2 (two) times daily. 09/18/23   Hoy Register, MD  lisinopril (ZESTRIL) 20 MG tablet Take 1 tablet (20 mg total) by mouth daily. 11/19/23   Hoy Register, MD  Magnesium 250 MG TABS Take 250 mg by mouth daily.    [provider]  meclizine (ANTIVERT) 25 MG tablet Take 1 tablet (25 mg total) by mouth 3 (three) times daily as needed for dizziness. 11/19/23   Hoy Register, MD  Multiple Vitamins-Minerals (MULTIVITAMIN ADULT PO) Take 1 tablet by mouth daily.     [provider]  oxymetazoline (AFRIN NASAL SPRAY) 0.05 % nasal spray Place 1 spray into both nostrils 2 (two) times daily. 10/31/21   Hoy Register, MD  SUMAtriptan (IMITREX) 100 MG tablet Take 1 tablet (100 mg total) by mouth once for 1 dose at the onset of a migraine. May repeat 1 tablet in 2 hours if migraine persists.  Maximum daily dose 200 mg  11/19/23 11/21/23  Hoy Register, MD  topiramate (TOPAMAX) 50 MG tablet Take 1 tablet (50 mg total) by mouth 2 (two) times daily. 11/19/23   Hoy Register, MD  triamcinolone cream (KENALOG) 0.1 % APPLY 1 APPLICATION TOPICALLY 2 (TWO) TIMES DAILY. 11/20/22   Hoy Register, MD  vitamin C (ASCORBIC ACID) 250 MG tablet Take 250 mg by mouth daily.    [provider]  diphenhydrAMINE (BENADRYL) 25 mg capsule Take 25 mg by mouth every 6 (six) hours as needed for itching. Reported on 01/23/2016  12/07/19  [provider]    Family History Family History  Problem Relation Age  of Onset   Hypertension Mother    Schizophrenia Mother    Schizophrenia Sister    Schizophrenia Brother    Schizophrenia Brother    Schizophrenia Son    Colon cancer Neg Hx    Esophageal cancer Neg Hx    Colon polyps Neg Hx    Rectal cancer Neg Hx    Stomach cancer Neg Hx     Social History Social History   Tobacco Use   Smoking status: Former    Current packs/day: 0.00    Types: Cigarettes    Quit date: 12/15/2003    Years since quitting: 20.0   Smokeless tobacco: Never   Tobacco comments:    quit 10 years ago  Vaping Use   Vaping status: Never Used  Substance Use Topics   Alcohol use: Yes    Alcohol/week: 2.0 standard drinks of alcohol    Types: 1 Glasses of wine, 1 Shots of liquor per week    Comment: Once every 3 months    Drug use: No     Allergies   Patient has no known allergies.   Review of Systems Review of Systems  Constitutional:  Negative for chills and fever.  Genitourinary:  Positive for flank pain. Negative for dysuria.  Musculoskeletal:  Positive for back pain and gait problem.  Neurological:  Negative for weakness and numbness.     Physical Exam Triage Vital Signs ED Triage Vitals  Encounter Vitals Group     BP 12/29/23 1214 136/83     Systolic BP Percentile --      Diastolic BP Percentile --      Pulse Rate 12/29/23 1214 70     Resp 12/29/23 1214 16      Temp 12/29/23 1214 98.2 F (36.8 C)     Temp Source 12/29/23 1214 Oral     SpO2 12/29/23 1214 99 %     Weight --      Height --      Head Circumference --      Peak Flow --      Pain Score 12/29/23 1217 10     Pain Loc --      Pain Education --      Exclude from Growth Chart --    No data found.  Updated Vital Signs BP 136/83 (BP Location: Left Arm)   Pulse 70   Temp 98.2 F (36.8 C) (Oral)   Resp 16   LMP 12/25/2023 (Exact Date)   SpO2 99%   Visual Acuity Right Eye Distance:   Left Eye Distance:   Bilateral Distance:    Right Eye Near:   Left Eye Near:    Bilateral Near:     Physical Exam Vitals reviewed.  Constitutional:      General: She is awake.     Appearance: Normal appearance. She is well-developed and well-groomed.  HENT:     Head: Normocephalic and atraumatic.  Pulmonary:     Effort: Pulmonary effort is normal.  Abdominal:     General: Abdomen is flat. Bowel sounds are normal.     Palpations: Abdomen is soft.     Tenderness: There is abdominal tenderness in the right lower quadrant and suprapubic area. There is right CVA tenderness and left CVA tenderness. Positive signs include McBurney's sign.  Musculoskeletal:     Cervical back: Normal range of motion.     Thoracic back: No swelling, edema, spasms or tenderness.     Lumbar back: Tenderness present. No swelling,  edema or spasms. Normal range of motion. Positive right straight leg raise test and positive left straight leg raise test.     Comments: No obvious step-offs or malformations noted to palpation along spine.  She does have tenderness along the right flank and positive CVA tenderness bilaterally. Patient is in notable pain with being laid down flat for physical exam.  Neurological:     Mental Status: She is alert.  Psychiatric:        Behavior: Behavior is cooperative.      UC Treatments / Results  Labs (all labs ordered are listed, but only abnormal results are displayed) Labs Reviewed   POCT URINALYSIS DIP (MANUAL ENTRY) - Abnormal; Notable for the following components:      Result Value   Urobilinogen, UA 2.0 (*)    All other components within normal limits    EKG   Radiology No results found.  Procedures Procedures (including critical care time)  Medications Ordered in UC Medications - No data to display  Initial Impression / Assessment and Plan / UC Course  I have reviewed the triage vital signs and the nursing notes.  Pertinent labs & imaging results that were available during my care of the patient were reviewed by me and considered in my medical decision making (see chart for details).      Final Clinical Impressions(s) / UC Diagnoses   Final diagnoses:  Acute right-sided low back pain, unspecified whether sciatica present  Right flank pain  RLQ abdominal pain   Patient was seen today for concerns for right flank pain as well as right sided low back pain.  On physical exam she has positive straight leg raise, positive CVA tenderness bilaterally as well as right lower quadrant tenderness.  Vitals appear overall normal and stable, no fever in clinic.  Chart review demonstrates significant medical history as she has had recent diagnosis of DVT as of November.  She is currently taking Eliquis.  She is being seen for ongoing kidney calculus most recent appointment for this appears to have been in December but I am not able to review notes or results.  Urinalysis is reassuring today without hematuria or leukocytes.  Given degree of pain and positive McBurney sign I am concerned for potential appendicitis.  Reviewed these findings with patient and recommend that with her degree of pain as well as physical exam findings that she should go to the emergency room for CT scan of the abdomen for further rule out.  Patient agrees with disposition and voices understanding.  Patient declines EMS services and states that she can get herself to the emergency room on her own.   Recommend close follow-up with primary care provider for ongoing management.     Discharge Instructions      At this time I recommend he go to the emergency room for further evaluation and management.  Given the severity of your pain as well as your discomfort in your back and flanks I am concerned that you may potentially be passing a kidney stone.  I am also concerned that there may be something going on with your appendix given the degree of tenderness in your right lower quadrant.  Urgent care does not have the imaging capability to rule out these concerns or manage her pain so I recommend prompt emergency room evaluation.     ED Prescriptions   None    PDMP not reviewed this encounter.   Courtney Fenlon, Oswaldo Conroy, PA-C 12/29/23 1306

## 2023-12-29 NOTE — Discharge Instructions (Signed)
 At this time I recommend he go to the emergency room for further evaluation and management.  Given the severity of your pain as well as your discomfort in your back and flanks I am concerned that you may potentially be passing a kidney stone.  I am also concerned that there may be something going on with your appendix given the degree of tenderness in your right lower quadrant.  Urgent care does not have the imaging capability to rule out these concerns or manage her pain so I recommend prompt emergency room evaluation.

## 2023-12-29 NOTE — Discharge Instructions (Signed)
 Take the medication as prescribed  The percocet is a controlled substance. Do not drive or operate heavy machinery while taking the medication  Follow up with your GYN  Return for new or worsening symptoms

## 2023-12-29 NOTE — ED Notes (Signed)
 Patient is being discharged from the Urgent Care and sent to the Emergency Department via pov . Per Jacquelin Hawking, PA, patient is in need of higher level of care due to limited resources. Patient is aware and verbalizes understanding of plan of care.  Vitals:   12/29/23 1214  BP: 136/83  Pulse: 70  Resp: 16  Temp: 98.2 F (36.8 C)  SpO2: 99%

## 2023-12-29 NOTE — ED Triage Notes (Signed)
 Pt presents with sudden onset of R flank and R lower back pain with urinary frequency since this AM. Pt was seen at [facility] and referred to ED for imaging. Pt denies N/V, fever.

## 2023-12-29 NOTE — ED Notes (Signed)
 Patient transported to ultrasound.

## 2023-12-30 ENCOUNTER — Ambulatory Visit (INDEPENDENT_AMBULATORY_CARE_PROVIDER_SITE_OTHER): Admitting: Family

## 2023-12-30 ENCOUNTER — Encounter (HOSPITAL_BASED_OUTPATIENT_CLINIC_OR_DEPARTMENT_OTHER): Payer: Self-pay | Admitting: Family

## 2023-12-30 VITALS — BP 124/64 | HR 75 | Ht 62.0 in | Wt 194.4 lb

## 2023-12-30 DIAGNOSIS — I10 Essential (primary) hypertension: Secondary | ICD-10-CM | POA: Diagnosis not present

## 2023-12-30 DIAGNOSIS — R0789 Other chest pain: Secondary | ICD-10-CM | POA: Diagnosis not present

## 2023-12-30 DIAGNOSIS — Z6835 Body mass index (BMI) 35.0-35.9, adult: Secondary | ICD-10-CM

## 2023-12-30 DIAGNOSIS — I700 Atherosclerosis of aorta: Secondary | ICD-10-CM

## 2023-12-30 LAB — GC/CHLAMYDIA PROBE AMP (~~LOC~~) NOT AT ARMC
Chlamydia: NEGATIVE
Comment: NEGATIVE
Comment: NORMAL
Neisseria Gonorrhea: NEGATIVE

## 2023-12-30 NOTE — Patient Instructions (Addendum)
 Medication Instructions:  Continue your current medications.   *If you need a refill on your cardiac medications before your next appointment, please call your pharmacy*  Lab Work: Your physician recommends that you return for lab work today: lipoprotein (a), lipid panel  If you have labs (blood work) drawn today and your tests are completely normal, you will receive your results only by: MyChart Message (if you have MyChart) OR A paper copy in the mail If you have any lab test that is abnormal or we need to change your treatment, we will call you to review the results.   Follow-Up: At [facility], you and your health needs are our priority.  As part of our continuing mission to provide you with exceptional heart care, we have created designated Provider Care Teams.  These Care Teams include your primary Cardiologist (physician) and Advanced Practice Providers (APPs -  Physician Assistants and Nurse Practitioners) who all work together to provide you with the care you need, when you need it.  We recommend signing up for the patient portal called "MyChart".  Sign up information is provided on this After Visit Summary.  MyChart is used to connect with patients for Virtual Visits (Telemedicine).  Patients are able to view lab/test results, encounter notes, upcoming appointments, etc.  Non-urgent messages can be sent to your provider as well.   To learn more about what you can do with MyChart, go to [email or web address].    Your next appointment:   1 year(s)  Provider:   Jodelle Red, MD    Other Instructions  Your CT scan 08/2023 showed no plaque build up in your heart arteries which is reassuring!  Heart Healthy Diet Recommendations: A low-salt diet is recommended. Meats should be grilled, baked, or boiled. Avoid fried foods. Focus on lean protein sources like fish or chicken with vegetables and fruits. The American Heart Association is a Chief Technology Officer!  American  Heart Association Diet and Lifeystyle Recommendations   Exercise recommendations: The American Heart Association recommends 150 minutes of moderate intensity exercise weekly. Try 30 minutes of moderate intensity exercise 4-5 times per week. This could include walking, jogging, or swimming.

## 2023-12-30 NOTE — Progress Notes (Signed)
 Cardiology Office Note:  .   Date:  12/30/2023  ID:  Barbara Dunn, DOB 04/16/1971, MRN [id number] PCP: Hoy Register, MD  [facility] Providers Cardiologist:  Jodelle Red, MD    History of Present Illness: .   Barbara Dunn is a 53 y.o. female with history of migraine, hypertension, SVT, asthma, DVT/PE on Eliquis, MGUS, aortic atherosclerosis.  Prior recommend 2016 with abnormal Myoview.  Subsequent LHC with normal LVEF and normal coronary arteries.  There were burst of SVT during procedure.  ED visit for chest pain 09/11/2023.  More recent visit to the ED 12/21/2023 with chest pain.  Described as shooting intermittent pain which then became constant.  EKG without ischemic changes.  Troponin negative X2.  Chest x-ray no concerning findings.  D-dimer was normal.  She was giving migraine cocktail and GI cocktail with improvement in her pain.  Seen in urgent care yesterday and referred to ED for her right lower back and right lower quadrant pain.  She had recently had cervical biopsy for abnormal uterine bleeding.  CT ABD/pelvis with right ovarian cyst, prominent cervix consistent with cervicitis.  She was given Rocephin, Flagyl.  Presents today for follow up. Reports her chest was "hurting real bad" last weekend. Reports the pain hurt both at rest and with activity in her right chest described as "aching". Reviewed reassuring ED workup. She has not had chest pain since ED visit. She reports an aunt with heart issues but not much knowledge of her family history. Reports no indigestion. Notes lots of stress and a friend suggested she might be having panic attacks and not noticing. Reviewed CT chest 08/2023 with no reported coronary calcification. CT abd 12/1023 does show aortic atherosclerosis. No formal exercise routine. Has been less active since diagnosis of PE 08/2023 as uncertain how to increase activity safely. Reports dark stool even prior to iron. Reviewed normal Hb. Encouraged to  discuss with PCP.   ROS: Please see the history of present illness.    All other systems reviewed and are negative.   Studies Reviewed: .         Risk Assessment/Calculations:             Physical Exam:   VS:  BP 124/64   Pulse 75   Ht 5\' 2"  (1.575 m)   Wt 194 lb 6.4 oz (88.2 kg)   LMP 12/20/2023 (Exact Date)   BMI 35.56 kg/m    Wt Readings from Last 3 Encounters:  12/30/23 194 lb 6.4 oz (88.2 kg)  12/29/23 195 lb (88.5 kg)  12/25/23 193 lb 3.2 oz (87.6 kg)    GEN: Well nourished, well developed in no acute distress NECK: No JVD; No carotid bruits CARDIAC: RRR, no murmurs, rubs, gallops RESPIRATORY:  Clear to auscultation without rales, wheezing or rhonchi  ABDOMEN: Soft, non-tender, non-distended EXTREMITIES:  No edema; No deformity   ASSESSMENT AND PLAN: .    Atypical chest pain -ED visit 01/09/2024 with right-sided chest pain at rest or with activity described as sharp pain.  Pain relieved by GI and migraine cockatil. EKG no acute ST/T wave changes. Reviewed atypical for angina.  Prior CT Chest 08/2023 with no reported coronary atherosclerosis. Reassurance provided. No recurrence since ED visit. No indication for ischemic evaluation as symptoms atypical for angina.   Aortic atherosclerosis -inadvertent finding by  CT abdomen 12/29/2023.  Lipid panel, lipoprotein a to assess cardiovascular risk factors. Recommend aiming for 150 minutes of moderate intensity activity per week and following a  heart healthy diet.    HTN - BP well controlled. Continue current antihypertensive regimen per PCP Motifene 2.5 mg daily, lisinopril 20 mg daily.  BMI 35 -refer to prep exercise program        Dispo: follow up in 1 year  Signed, Alver Sorrow, NP

## 2023-12-31 ENCOUNTER — Ambulatory Visit (INDEPENDENT_AMBULATORY_CARE_PROVIDER_SITE_OTHER): Payer: 59

## 2023-12-31 VITALS — BP 122/78 | HR 65 | Temp 98.0°F | Resp 18 | Ht 62.0 in | Wt 194.6 lb

## 2023-12-31 DIAGNOSIS — D509 Iron deficiency anemia, unspecified: Secondary | ICD-10-CM

## 2023-12-31 DIAGNOSIS — D500 Iron deficiency anemia secondary to blood loss (chronic): Secondary | ICD-10-CM

## 2023-12-31 MED ORDER — DIPHENHYDRAMINE HCL 25 MG PO CAPS
25.0000 mg | ORAL_CAPSULE | Freq: Once | ORAL | Status: AC
  Administered 2023-12-31: 25 mg via ORAL
  Filled 2023-12-31: qty 1

## 2023-12-31 MED ORDER — ACETAMINOPHEN 325 MG PO TABS
650.0000 mg | ORAL_TABLET | Freq: Once | ORAL | Status: AC
  Administered 2023-12-31: 650 mg via ORAL
  Filled 2023-12-31: qty 2

## 2023-12-31 MED ORDER — IRON SUCROSE 20 MG/ML IV SOLN
200.0000 mg | Freq: Once | INTRAVENOUS | Status: AC
  Administered 2023-12-31: 200 mg via INTRAVENOUS
  Filled 2023-12-31: qty 10

## 2023-12-31 NOTE — Progress Notes (Signed)
 Diagnosis: Iron Deficiency Anemia  Provider:  Chilton Greathouse MD  Procedure: IV Push  IV Type: Peripheral, IV Location: R Antecubital  Venofer (Iron Sucrose), Dose: 200 mg  Post Infusion IV Care: Observation period completed and Peripheral IV Discontinued  Discharge: Condition: Good, Destination: Home . AVS Declined  Performed by:  Adriana Mccallum, RN

## 2024-01-01 ENCOUNTER — Encounter (HOSPITAL_BASED_OUTPATIENT_CLINIC_OR_DEPARTMENT_OTHER): Payer: Self-pay

## 2024-01-01 ENCOUNTER — Other Ambulatory Visit (HOSPITAL_COMMUNITY): Payer: Self-pay

## 2024-01-01 DIAGNOSIS — I10 Essential (primary) hypertension: Secondary | ICD-10-CM

## 2024-01-01 DIAGNOSIS — Z5181 Encounter for therapeutic drug level monitoring: Secondary | ICD-10-CM

## 2024-01-01 DIAGNOSIS — I700 Atherosclerosis of aorta: Secondary | ICD-10-CM

## 2024-01-01 DIAGNOSIS — E785 Hyperlipidemia, unspecified: Secondary | ICD-10-CM

## 2024-01-01 LAB — LIPID PANEL
Chol/HDL Ratio: 2.2 ratio (ref 0.0–4.4)
Cholesterol, Total: 211 mg/dL — ABNORMAL HIGH (ref 100–199)
HDL: 95 mg/dL (ref >39)
LDL Chol Calc (NIH): 105 mg/dL — ABNORMAL HIGH (ref 0–99)
Triglycerides: 62 mg/dL (ref 0–149)
VLDL Cholesterol Cal: 11 mg/dL (ref 5–40)

## 2024-01-01 LAB — LIPOPROTEIN A (LPA): Lipoprotein (a): 212.9 nmol/L — ABNORMAL HIGH (ref <75.0)

## 2024-01-01 MED ORDER — ROSUVASTATIN CALCIUM 10 MG PO TABS
10.0000 mg | ORAL_TABLET | Freq: Every day | ORAL | 3 refills | Status: AC
  Filled 2024-01-01: qty 30, 30d supply, fill #0
  Filled 2024-02-02: qty 30, 30d supply, fill #1
  Filled 2024-03-08: qty 30, 30d supply, fill #2
  Filled 2024-04-05: qty 30, 30d supply, fill #3
  Filled 2024-05-10: qty 30, 30d supply, fill #4

## 2024-01-02 ENCOUNTER — Telehealth: Payer: Self-pay

## 2024-01-02 NOTE — Telephone Encounter (Signed)
 Called to discuss PREP program; she would like to attend at [facility] on April 15, every T/Th at 2pm; will contact first week of April to confirm and set up assessment visit.

## 2024-01-06 ENCOUNTER — Ambulatory Visit: Payer: 59

## 2024-01-06 ENCOUNTER — Other Ambulatory Visit (HOSPITAL_COMMUNITY): Payer: Self-pay

## 2024-01-06 ENCOUNTER — Encounter: Payer: Self-pay | Admitting: Hematology and Oncology

## 2024-01-07 ENCOUNTER — Ambulatory Visit (INDEPENDENT_AMBULATORY_CARE_PROVIDER_SITE_OTHER): Payer: 59 | Admitting: *Deleted

## 2024-01-07 VITALS — BP 127/80 | HR 62 | Temp 99.3°F | Resp 18 | Ht 62.5 in | Wt 195.8 lb

## 2024-01-07 DIAGNOSIS — D509 Iron deficiency anemia, unspecified: Secondary | ICD-10-CM | POA: Diagnosis not present

## 2024-01-07 DIAGNOSIS — D500 Iron deficiency anemia secondary to blood loss (chronic): Secondary | ICD-10-CM

## 2024-01-07 MED ORDER — IRON SUCROSE 20 MG/ML IV SOLN
200.0000 mg | Freq: Once | INTRAVENOUS | Status: AC
  Administered 2024-01-07: 200 mg via INTRAVENOUS
  Filled 2024-01-07: qty 10

## 2024-01-07 MED ORDER — DIPHENHYDRAMINE HCL 25 MG PO CAPS
25.0000 mg | ORAL_CAPSULE | Freq: Once | ORAL | Status: AC
  Administered 2024-01-07: 25 mg via ORAL
  Filled 2024-01-07: qty 1

## 2024-01-07 MED ORDER — ACETAMINOPHEN 325 MG PO TABS
650.0000 mg | ORAL_TABLET | Freq: Once | ORAL | Status: AC
  Administered 2024-01-07: 650 mg via ORAL
  Filled 2024-01-07: qty 2

## 2024-01-07 NOTE — Progress Notes (Signed)
 Diagnosis: Iron Deficiency Anemia  Provider:  Chilton Greathouse MD  Procedure: IV Push  IV Type: Peripheral, IV Location: L Antecubital  Venofer (Iron Sucrose), Dose: 200 mg  Post Infusion IV Care: Observation period completed and Peripheral IV Discontinued  Discharge: Condition: Good, Destination: Home . AVS Provided  Performed by:  Forrest Moron, RN

## 2024-01-09 ENCOUNTER — Other Ambulatory Visit (HOSPITAL_COMMUNITY): Payer: Self-pay

## 2024-01-09 ENCOUNTER — Ambulatory Visit: Attending: Family Medicine | Admitting: Family Medicine

## 2024-01-09 ENCOUNTER — Encounter: Payer: Self-pay | Admitting: Family Medicine

## 2024-01-09 VITALS — BP 130/84 | HR 62 | Ht 62.5 in | Wt 192.8 lb

## 2024-01-09 DIAGNOSIS — I749 Embolism and thrombosis of unspecified artery: Secondary | ICD-10-CM

## 2024-01-09 DIAGNOSIS — B3731 Acute candidiasis of vulva and vagina: Secondary | ICD-10-CM

## 2024-01-09 DIAGNOSIS — E7841 Elevated Lipoprotein(a): Secondary | ICD-10-CM | POA: Diagnosis not present

## 2024-01-09 DIAGNOSIS — N72 Inflammatory disease of cervix uteri: Secondary | ICD-10-CM | POA: Diagnosis not present

## 2024-01-09 DIAGNOSIS — D500 Iron deficiency anemia secondary to blood loss (chronic): Secondary | ICD-10-CM | POA: Diagnosis not present

## 2024-01-09 DIAGNOSIS — R0789 Other chest pain: Secondary | ICD-10-CM | POA: Diagnosis not present

## 2024-01-09 MED ORDER — FLUCONAZOLE 150 MG PO TABS
150.0000 mg | ORAL_TABLET | Freq: Once | ORAL | 0 refills | Status: AC
Start: 2024-01-09 — End: 2024-01-10
  Filled 2024-01-09: qty 2, 3d supply, fill #0

## 2024-01-09 NOTE — Patient Instructions (Signed)
 VISIT SUMMARY:  During today's visit, we discussed your recent biopsy and subsequent infection, your concerns about chest pain, and the need for lifelong cholesterol medication. We also reviewed your current treatment for venous thromboembolism, iron deficiency anemia, and the new symptoms of discharge and itching. Additionally, we talked about your recent emergency room visit and your upcoming exercise program.  YOUR PLAN:  -CHEST PAIN: Your chest pain does not appear to be related to heart disease and may be due to muscle strain, anxiety, or past blood clots. If you experience chest pain again, please seek emergency care immediately.  -VENOUS THROMBOEMBOLISM: You are currently taking Eliquis to prevent blood clots. The duration of this treatment is still being determined, and you will have a follow-up appointment at the anticoagulation clinic. Please continue taking Eliquis as prescribed.  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 You should start taking Crestor at night and continue this medication indefinitely to manage your cholesterol levels.  -IRON DEFICIENCY ANEMIA: Your iron levels are being managed with IV iron infusions, and your hemoglobin levels are normal. You should stop taking oral iron supplements while you are receiving IV infusions.  -POST-BIOPSY INFECTION: You had an infection after your biopsy, which was treated with antibiotics. Due to the side effects of Flagyl, you were unable to complete the full course. Your current symptoms suggest a yeast infection, and you have been prescribed Diflucan to treat it. Please take one dose now and another in 72 hours. The prescription has been sent to [facility].  -GENERAL HEALTH MAINTENANCE: You are scheduled to start an exercise program on the 15th, which has been tailored to accommodate your health conditions. Regular exercise is important for your  overall health.  INSTRUCTIONS:  Please follow up with your cardiologist in one year, return for blood work in three months, and attend your regular check-up in July.

## 2024-01-09 NOTE — BH Specialist Note (Signed)
 Integrated Behavioral Health via Telemedicine Visit  01/23/2024 Barbara Dunn [id number]  Number of Integrated Behavioral Health Clinician visits: 4- Fourth Visit  Session Start time: 1318   Session End time: 1403  Total time in minutes: 45  Referring Provider: Lorriane Shire, MD Patient/Family location: [facility] Provider location: [facility]  All persons participating in visit: Patient Barbara Dunn and [facility] Barbara Dunn   Types of Service: Individual psychotherapy and Video visit  I connected with Barbara Dunn and/or Barbara Dunn's  n/a  via  Telephone or Video Enabled Telemedicine Application  (Video is Caregility application) and verified that I am speaking with the correct person using two identifiers. Discussed confidentiality: Yes   I discussed the limitations of telemedicine and the availability of in person appointments.  Discussed there is a possibility of technology failure and discussed alternative modes of communication if that failure occurs.  I discussed that engaging in this telemedicine visit, they consent to the provision of behavioral healthcare and the services will be billed under their insurance.  Patient and/or legal guardian expressed understanding and consented to Telemedicine visit: Yes   Presenting Concerns: Patient and/or family reports the following symptoms/concerns: Worries about son being incarcerated; grandchildren in DSS custody; financial stress; health concerns. Duration of problem: Ongoing; Severity of problem: moderate  Patient and/or Family's Strengths/Protective Factors: Concrete supports in place (healthy food, safe environments, etc.) and Sense of purpose  Goals Addressed: Patient will:  Reduce symptoms of: anxiety, depression, and stress   Increase knowledge and/or ability of: stress reduction   Demonstrate ability to: Increase healthy adjustment to current life circumstances and  Increase motivation to adhere to plan of care  Progress towards Goals: Ongoing  Interventions: Interventions utilized:  Motivational Interviewing and Supportive Reflection Standardized Assessments completed: Not Needed  Patient and/or Family Response: Patient agrees with treatment plan.   Assessment: Patient currently experiencing Adjustment disorder with mixed anxious and depressed mood; Grief; Psychosocial stress.   Patient may benefit from continued therapeutic intervention  .  Plan: Follow up with behavioral health clinician on : Three weeks Behavioral recommendations:  -Continue daily self-coping strategies (relaxation breathing, worry time, setting healthy boundaries; focus on things within personal control) -Continue plan to attend upcoming court date on 02/12/24; being present for son and grandchildren as able -Continue plan to set up deferred car payment with call at end of May 2025; consider options for extended warranty to help with future repairs -Continue plan to pay off smallest credit card first  Referral(s): Integrated [employer] (In Clinic)  I discussed the assessment and treatment plan with the patient and/or parent/guardian. They were provided an opportunity to ask questions and all were answered. They agreed with the plan and demonstrated an understanding of the instructions.   They were advised to call back or seek an in-person evaluation if the symptoms worsen or if the condition fails to improve as anticipated.  Barbara Close Danell Verno, LCSW

## 2024-01-09 NOTE — Progress Notes (Signed)
 Subjective:  Patient ID: Barbara Dunn, female    DOB: 1971/05/15  Age: 53 y.o. MRN: [id number]  CC: Hospitalization Follow-up (Discuss iron pills/)     Discussed the use of AI scribe software for clinical note transcription with the patient, who gave verbal consent to proceed.  History of Present Illness The patient, with a history of hypertension, migraine, thromboembolic disorder- right peroneal DVT (in 01/2023 completed treatment with Eliquis), left lower extremity DVT involving left femoral, peroneal, popliteal, tibioperoneal trunk, PE in 08/2023, MGUS, iron deficiency anemia  presents for a follow-up visit after a couple of ED visits.    She had presented to the ED with chest pain and acute coronary syndrome was ruled out with a subsequent follow-up with cardiology. She had undergone a uterine biopsy and subsequently developed cervicitis which was diagnosed at an ED visit and she was placed on doxycycline, Flagyl and Percocet. The patient reports having right-sided chest pain and is concerned about the need for lifelong cholesterol medication.  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  The patient has not yet started taking the prescribed Crestor and is seeking further clarification on its necessity and timing of administration.  The patient also reports having completed a course of antibiotics (Flagyl and Doxycycline) for the cervicitis. However, the patient experienced significant nausea and vomiting, particularly with Flagyl, and was only able to complete five out of seven days of the medication. The patient completed the full course of Doxycycline. The patient now reports new symptoms of discharge and itching.  The patient also mentions ongoing fatigue and a recent enrollment in an exercise program. The patient is receiving IV iron infusions and is unsure if she should continue taking oral iron supplements.  Last  hemoglobin was normal at 12.6.    Past Medical History:  Diagnosis Date   Anemia    Arthritis    Asthma    Bronchitis    Chronic kidney disease    DVT (deep venous thrombosis) (HCC)    Hypertension    Kidney stone    Migraines    Obesity    Pulmonary embolism [facility])     Past Surgical History:  Procedure Laterality Date   CARDIAC CATHETERIZATION N/A 08/02/2015   Procedure: Left Heart Cath and Coronary Angiography;  Surgeon: Lennette Bihari, MD;  Location: [facility] INVASIVE CV LAB;  Service: Cardiovascular;  Laterality: N/A;   CERVIX LESION DESTRUCTION     TUBAL LIGATION     TUBAL LIGATION      Family History  Problem Relation Age of Onset   Hypertension Mother    Schizophrenia Mother    Schizophrenia Sister    Schizophrenia Brother    Schizophrenia Brother    Schizophrenia Son    Colon cancer Neg Hx    Esophageal cancer Neg Hx    Colon polyps Neg Hx    Rectal cancer Neg Hx    Stomach cancer Neg Hx     Social History   Socioeconomic History   Marital status: Single    Spouse name: Not on file   Number of children: Not on file   Years of education: Not on file   Highest education level: Not on file  Occupational History   Not on file  Tobacco Use   Smoking status: Former    Current packs/day: 0.00    Types: Cigarettes    Quit date: 12/15/2003    Years since  quitting: 20.0   Smokeless tobacco: Never   Tobacco comments:    quit 10 years ago  Vaping Use   Vaping status: Never Used  Substance and Sexual Activity   Alcohol use: Yes    Alcohol/week: 2.0 standard drinks of alcohol    Types: 1 Glasses of wine, 1 Shots of liquor per week    Comment: Once every 3 months    Drug use: No   Sexual activity: Yes    Birth control/protection: Other-see comments    Comment: tubal  Other Topics Concern   Not on file  Social History Narrative   Pt has GED. CNA as well.    Social Drivers of Health   Financial Resource Strain: Medium Risk (11/19/2023)   Overall  Financial Resource Strain (CARDIA)    Difficulty of Paying Living Expenses: Somewhat hard  Food Insecurity: Food Insecurity Present (11/19/2023)   Hunger Vital Sign    Worried About Running Out of Food in the Last Year: Sometimes true    Ran Out of Food in the Last Year: Sometimes true  Transportation Needs: No Transportation Needs (11/19/2023)   PRAPARE - Administrator, Civil Service (Medical): No    Lack of Transportation (Non-Medical): No  Physical Activity: Inactive (11/19/2023)   Exercise Vital Sign    Days of Exercise per Week: 0 days    Minutes of Exercise per Session: 0 min  Stress: Stress Concern Present (11/19/2023)   [employer] of Occupational Health - Occupational Stress Questionnaire    Feeling of Stress : To some extent  Social Connections: Moderately Isolated (11/19/2023)   Social Connection and Isolation Panel [NHANES]    Frequency of Communication with Friends and Family: Three times a week    Frequency of Social Gatherings with Friends and Family: Once a week    Attends Religious Services: Never    Database administrator or Organizations: No    Attends Engineer, structural: 1 to 4 times per year    Marital Status: Never married    No Known Allergies  Outpatient Medications Prior to Visit  Medication Sig Dispense Refill   albuterol (VENTOLIN HFA) 108 (90 Base) MCG/ACT inhaler Inhale 1 puff into the lungs every 6 (six) hours as needed for wheezing and shortness of breath 6.7 g 1   amLODipine (NORVASC) 2.5 MG tablet Take 1 tablet (2.5 mg total) by mouth daily. 90 tablet 1   apixaban (ELIQUIS) 5 MG TABS tablet Take 1 tablet (5 mg total) by mouth 2 (two) times daily. Start taking after completion of starter pack. 60 tablet 5   APPLE CIDER VINEGAR PO Take 1 capsule by mouth daily.     BIOTIN PO Take 2 tablets by mouth daily.      cetirizine (ZYRTEC) 10 MG tablet Take 10 mg by mouth daily.     [employer] FIBER GUMMY BEARS CHILDREN PO Take by mouth. Also  contains B12, vitamin C, and vitamin D     docusate sodium (COLACE) 100 MG capsule Take 100 mg by mouth as needed.      ELDERBERRY PO Take by mouth.     fluticasone (FLONASE) 50 MCG/ACT nasal spray PLACE 2 SPRAYS INTO BOTH NOSTRILS DAILY. 16 g 2   fluticasone (FLOVENT HFA) 110 MCG/ACT inhaler INHALE 1 PUFF INTO THE LUNGS 2 (TWO) TIMES DAILY. 12 g 2   hydrOXYzine (ATARAX) 25 MG tablet Take 1 tablet (25 mg total) by mouth at bedtime as needed. 90 tablet 1  Iron, Ferrous Sulfate, 325 (65 Fe) MG TABS Take 325 mg by mouth 2 (two) times daily. 60 tablet 3   lisinopril (ZESTRIL) 20 MG tablet Take 1 tablet (20 mg total) by mouth daily. 90 tablet 1   Magnesium 250 MG TABS Take 250 mg by mouth daily.     meclizine (ANTIVERT) 25 MG tablet Take 1 tablet (25 mg total) by mouth 3 (three) times daily as needed for dizziness. 60 tablet 1   Multiple Vitamins-Minerals (MULTIVITAMIN ADULT PO) Take 1 tablet by mouth daily.      oxymetazoline (AFRIN NASAL SPRAY) 0.05 % nasal spray Place 1 spray into both nostrils 2 (two) times daily. 30 mL 0   rosuvastatin (CRESTOR) 10 MG tablet Take 1 tablet (10 mg total) by mouth daily. 90 tablet 3   topiramate (TOPAMAX) 50 MG tablet Take 1 tablet (50 mg total) by mouth 2 (two) times daily. 180 tablet 1   triamcinolone cream (KENALOG) 0.1 % APPLY 1 APPLICATION TOPICALLY 2 (TWO) TIMES DAILY. 30 g 2   doxycycline (VIBRAMYCIN) 100 MG capsule Take 1 capsule (100 mg total) by mouth 2 (two) times daily. (Patient not taking: Reported on 01/09/2024) 20 capsule 0   metroNIDAZOLE (FLAGYL) 500 MG tablet Take 1 tablet (500 mg total) by mouth 2 (two) times daily. (Patient not taking: Reported on 01/09/2024) 14 tablet 0   SUMAtriptan (IMITREX) 100 MG tablet Take 1 tablet (100 mg total) by mouth once for 1 dose at the onset of a migraine. May repeat 1 tablet in 2 hours if migraine persists.  Maximum daily dose 200 mg 10 tablet 6   vitamin C (ASCORBIC ACID) 250 MG tablet Take 250 mg by mouth daily.  (Patient not taking: Reported on 01/09/2024)     oxyCODONE-acetaminophen (PERCOCET/ROXICET) 5-325 MG tablet Take 1 tablet by mouth every 6 (six) hours as needed for severe pain (pain score 7-10). (Patient not taking: Reported on 01/09/2024) 15 tablet 0   No facility-administered medications prior to visit.     ROS Review of Systems  Constitutional:  Negative for activity change and appetite change.  HENT:  Negative for sinus pressure and sore throat.   Respiratory:  Negative for chest tightness, shortness of breath and wheezing.   Cardiovascular:  Positive for chest pain. Negative for palpitations.  Gastrointestinal:  Negative for abdominal distention, abdominal pain and constipation.  Genitourinary: Negative.   Musculoskeletal: Negative.   Psychiatric/Behavioral:  Negative for behavioral problems and dysphoric mood.     Objective:  BP 130/84   Pulse 62   Ht 5' 2.5" (1.588 m)   Wt 192 lb 12.8 oz (87.5 kg)   LMP 12/20/2023 (Exact Date)   SpO2 100%   BMI 34.70 kg/m      01/09/2024   10:23 AM 01/07/2024    2:39 PM 01/07/2024    1:33 PM  BP/Weight  Systolic BP 130 127 120  Diastolic BP 84 80 78  Wt. (Lbs) 192.8  195.8  BMI 34.7 kg/m2  35.24 kg/m2      Physical Exam Constitutional:      Appearance: She is well-developed.  Cardiovascular:     Rate and Rhythm: Normal rate.     Heart sounds: Normal heart sounds. No murmur heard. Pulmonary:     Effort: Pulmonary effort is normal.     Breath sounds: Normal breath sounds. No wheezing or rales.  Chest:     Chest wall: No tenderness.  Abdominal:     General: Bowel sounds are normal. There  is no distension.     Palpations: Abdomen is soft. There is no mass.     Tenderness: There is no abdominal tenderness.  Musculoskeletal:        General: Normal range of motion.     Right lower leg: No edema.     Left lower leg: No edema.  Neurological:     Mental Status: She is alert and oriented to person, place, and time.   Psychiatric:        Mood and Affect: Mood normal.        Latest Ref Rng & Units 12/29/2023    1:57 PM 12/21/2023   12:49 PM 11/11/2023    2:13 PM  CMP  Glucose 70 - 99 mg/dL 90  71  86   BUN 6 - 20 mg/dL 14  12  13    Creatinine 0.44 - 1.00 mg/dL 7.84  6.96  2.95   Sodium 135 - 145 mmol/L 140  137  139   Potassium 3.5 - 5.1 mmol/L 4.2  3.5  3.6   Chloride 98 - 111 mmol/L 108  106  108   CO2 22 - 32 mmol/L 22  22  26    Calcium 8.9 - 10.3 mg/dL 9.1  9.0  9.0   Total Protein 6.5 - 8.1 g/dL 6.6   6.8   Total Bilirubin 0.0 - 1.2 mg/dL 0.5   0.4   Alkaline Phos 38 - 126 U/L 43   48   AST 15 - 41 U/L 25   20   ALT 0 - 44 U/L 17   14     Lipid Panel     Component Value Date/Time   CHOL 211 (H) 12/30/2023 0927   TRIG 62 12/30/2023 0927   HDL 95 12/30/2023 0927   CHOLHDL 2.2 12/30/2023 0927   CHOLHDL 1.8 07/27/2016 0917   VLDL 14 07/27/2016 0917   LDLCALC 105 (H) 12/30/2023 0927    CBC    Component Value Date/Time   WBC 6.5 12/29/2023 1357   RBC 4.24 12/29/2023 1357   HGB 12.6 12/29/2023 1357   HGB 11.2 (L) 11/11/2023 1413   HGB 11.6 02/15/2023 0944   HCT 39.8 12/29/2023 1357   HCT 36.9 02/15/2023 0944   PLT 276 12/29/2023 1357   PLT 257 11/11/2023 1413   PLT 265 02/15/2023 0944   MCV 93.9 12/29/2023 1357   MCV 90 02/15/2023 0944   MCH 29.7 12/29/2023 1357   MCHC 31.7 12/29/2023 1357   RDW 14.3 12/29/2023 1357   RDW 12.1 02/15/2023 0944   LYMPHSABS 2.4 12/29/2023 1357   LYMPHSABS 2.1 11/20/2022 0917   MONOABS 0.7 12/29/2023 1357   EOSABS 0.2 12/29/2023 1357   EOSABS 0.1 11/20/2022 0917   BASOSABS 0.1 12/29/2023 1357   BASOSABS 0.1 11/20/2022 0917    Lab Results  Component Value Date   HGBA1C 5.3 11/20/2022    The 10-year ASCVD risk score (Arnett DK, et al., 2019) is: 2%   Values used to calculate the score:     Age: 53 years     Sex: Female     Is Non-Hispanic African American: Yes     Diabetic: No     Tobacco smoker: No     Systolic Blood Pressure: 130  mmHg     Is BP treated: Yes     HDL Cholesterol: 95 mg/dL     Total Cholesterol: 211 mg/dL     Assessment & Plan Atypical Chest Pain Evaluated in the ED  and coronary syndrome ruled out. -Cardiac workup in 2019 revealed echo with EF of 60 to 65% -Evaluated by the cardiology APP and etiology thought to be noncardiac -Intermittent chest pain with no heart disease. Possible musculoskeletal, anxiety, or post-pulmonary embolism causes. - Advise emergency care if chest pain occurs.  Venous Thromboembolism -History of PE, recurrent DVT On Eliquis for venous thromboembolism. Therapy duration uncertain.  - Continue Eliquis as prescribed. 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 Crestor prescribed indefinitely. - Start Crestor at night. - Educated on indefinite medication continuation.  Iron Deficiency Anemia Receiving IV iron infusions. Hemoglobin 12.6, normal. Advised to discontinue oral iron. - Discontinue oral iron supplements during IV infusions.  Cervicitis Post-biopsy infection treated with antibiotics. Adverse effects from Flagyl. Symptoms suggestive of yeast infection. - Prescribe Diflucan for yeast infection: one dose now, another in 72 hours. - Send prescription to [facility].  General Health Maintenance Exercise program scheduled to start on the 15th, accommodating health conditions. - Start exercise program on the 15th.  Follow-up Follow-up appointments scheduled with cardiologist, for blood work, and regular check-up. - Follow up with cardiologist in one year. - Return for blood work in three months. - Attend regular check-up in July.      Meds ordered this encounter  Medications   fluconazole (DIFLUCAN) 150 MG tablet    Sig: Take 1 tablet (150 mg total) by mouth once for 1 dose. Then repeat in 72 hours    Dispense:  2 tablet    Refill:  0    Follow-up: Return for  previously scheduled appointment.       Hoy Register, MD, FAAFP. [facility] [place], [place] [phone]   01/09/2024, 12:50 PM

## 2024-01-14 ENCOUNTER — Ambulatory Visit (INDEPENDENT_AMBULATORY_CARE_PROVIDER_SITE_OTHER): Payer: 59

## 2024-01-14 VITALS — BP 158/77 | HR 74 | Temp 97.8°F | Resp 16 | Ht 62.5 in | Wt 190.0 lb

## 2024-01-14 DIAGNOSIS — D509 Iron deficiency anemia, unspecified: Secondary | ICD-10-CM

## 2024-01-14 DIAGNOSIS — D500 Iron deficiency anemia secondary to blood loss (chronic): Secondary | ICD-10-CM

## 2024-01-14 MED ORDER — IRON SUCROSE 20 MG/ML IV SOLN
200.0000 mg | Freq: Once | INTRAVENOUS | Status: AC
  Administered 2024-01-14: 200 mg via INTRAVENOUS
  Filled 2024-01-14: qty 10

## 2024-01-14 MED ORDER — DIPHENHYDRAMINE HCL 25 MG PO CAPS
25.0000 mg | ORAL_CAPSULE | Freq: Once | ORAL | Status: AC
  Administered 2024-01-14: 25 mg via ORAL
  Filled 2024-01-14: qty 1

## 2024-01-14 MED ORDER — ACETAMINOPHEN 325 MG PO TABS
650.0000 mg | ORAL_TABLET | Freq: Once | ORAL | Status: AC
  Administered 2024-01-14: 650 mg via ORAL
  Filled 2024-01-14: qty 2

## 2024-01-14 NOTE — Progress Notes (Signed)
 Diagnosis: Iron Deficiency Anemia  Provider:  Chilton Greathouse MD  Procedure: IV Push  IV Type: Peripheral, IV Location: L Antecubital  Venofer (Iron Sucrose), Dose: 200 mg  Post Infusion IV Care: Observation period completed and Peripheral IV Discontinued  Discharge: Condition: Good, Destination: Home . AVS Declined  Performed by:  Adriana Mccallum, RN

## 2024-01-21 ENCOUNTER — Ambulatory Visit: Payer: 59

## 2024-01-23 ENCOUNTER — Ambulatory Visit (INDEPENDENT_AMBULATORY_CARE_PROVIDER_SITE_OTHER): Admitting: Clinical

## 2024-01-23 DIAGNOSIS — Z658 Other specified problems related to psychosocial circumstances: Secondary | ICD-10-CM

## 2024-01-23 DIAGNOSIS — F4323 Adjustment disorder with mixed anxiety and depressed mood: Secondary | ICD-10-CM

## 2024-01-23 DIAGNOSIS — F4321 Adjustment disorder with depressed mood: Secondary | ICD-10-CM

## 2024-01-27 ENCOUNTER — Telehealth: Payer: Self-pay

## 2024-01-27 NOTE — Telephone Encounter (Signed)
 Called to confirm start date of next PREP class on April 22, every T/Th at 2pm; assessment visit scheduled for 4/15 at 2pm.

## 2024-02-04 ENCOUNTER — Other Ambulatory Visit: Payer: Self-pay | Admitting: Hematology and Oncology

## 2024-02-04 ENCOUNTER — Inpatient Hospital Stay: Payer: 59 | Attending: Hematology and Oncology

## 2024-02-04 DIAGNOSIS — Z7901 Long term (current) use of anticoagulants: Secondary | ICD-10-CM | POA: Insufficient documentation

## 2024-02-04 DIAGNOSIS — Z86718 Personal history of other venous thrombosis and embolism: Secondary | ICD-10-CM | POA: Diagnosis not present

## 2024-02-04 DIAGNOSIS — Z87891 Personal history of nicotine dependence: Secondary | ICD-10-CM | POA: Insufficient documentation

## 2024-02-04 DIAGNOSIS — D500 Iron deficiency anemia secondary to blood loss (chronic): Secondary | ICD-10-CM | POA: Diagnosis not present

## 2024-02-04 DIAGNOSIS — N920 Excessive and frequent menstruation with regular cycle: Secondary | ICD-10-CM | POA: Insufficient documentation

## 2024-02-04 DIAGNOSIS — D472 Monoclonal gammopathy: Secondary | ICD-10-CM

## 2024-02-04 LAB — CBC WITH DIFFERENTIAL (CANCER CENTER ONLY)
Abs Immature Granulocytes: 0.01 10*3/uL (ref 0.00–0.07)
Basophils Absolute: 0.1 10*3/uL (ref 0.0–0.1)
Basophils Relative: 1 %
Eosinophils Absolute: 0.2 10*3/uL (ref 0.0–0.5)
Eosinophils Relative: 2 %
HCT: 39.7 % (ref 36.0–46.0)
Hemoglobin: 13 g/dL (ref 12.0–15.0)
Immature Granulocytes: 0 %
Lymphocytes Relative: 40 %
Lymphs Abs: 2.7 10*3/uL (ref 0.7–4.0)
MCH: 29.7 pg (ref 26.0–34.0)
MCHC: 32.7 g/dL (ref 30.0–36.0)
MCV: 90.8 fL (ref 80.0–100.0)
Monocytes Absolute: 0.7 10*3/uL (ref 0.1–1.0)
Monocytes Relative: 10 %
Neutro Abs: 3.3 10*3/uL (ref 1.7–7.7)
Neutrophils Relative %: 47 %
Platelet Count: 210 10*3/uL (ref 150–400)
RBC: 4.37 MIL/uL (ref 3.87–5.11)
RDW: 13.6 % (ref 11.5–15.5)
WBC Count: 6.9 10*3/uL (ref 4.0–10.5)
nRBC: 0 % (ref 0.0–0.2)

## 2024-02-04 LAB — CMP (CANCER CENTER ONLY)
ALT: 15 U/L (ref 0–44)
AST: 17 U/L (ref 15–41)
Albumin: 4.5 g/dL (ref 3.5–5.0)
Alkaline Phosphatase: 51 U/L (ref 38–126)
Anion gap: 5 (ref 5–15)
BUN: 12 mg/dL (ref 6–20)
CO2: 24 mmol/L (ref 22–32)
Calcium: 9.2 mg/dL (ref 8.9–10.3)
Chloride: 112 mmol/L — ABNORMAL HIGH (ref 98–111)
Creatinine: 1.12 mg/dL — ABNORMAL HIGH (ref 0.44–1.00)
GFR, Estimated: 60 mL/min — ABNORMAL LOW (ref >60)
Glucose, Bld: 78 mg/dL (ref 70–99)
Potassium: 3.7 mmol/L (ref 3.5–5.1)
Sodium: 141 mmol/L (ref 135–145)
Total Bilirubin: 0.6 mg/dL (ref 0.0–1.2)
Total Protein: 7.4 g/dL (ref 6.5–8.1)

## 2024-02-04 LAB — IRON AND IRON BINDING CAPACITY (CC-WL,HP ONLY)
Iron: 128 ug/dL (ref 28–170)
Saturation Ratios: 51 % — ABNORMAL HIGH (ref 10.4–31.8)
TIBC: 253 ug/dL (ref 250–450)
UIBC: 125 ug/dL — ABNORMAL LOW (ref 148–442)

## 2024-02-04 LAB — RETIC PANEL
Immature Retic Fract: 4.5 % (ref 2.3–15.9)
RBC.: 4.31 MIL/uL (ref 3.87–5.11)
Retic Count, Absolute: 40.5 10*3/uL (ref 19.0–186.0)
Retic Ct Pct: 0.9 % (ref 0.4–3.1)
Reticulocyte Hemoglobin: 33.9 pg (ref >27.9)

## 2024-02-04 LAB — FERRITIN: Ferritin: 230 ng/mL (ref 11–307)

## 2024-02-04 NOTE — BH Specialist Note (Unsigned)
 Integrated Behavioral Health Follow Up In-Person Visit  MRN: [id number] Name: Barbara Dunn  Number of Integrated Behavioral Health Clinician visits: 5-Fifth Visit  Session Start time: 1515   Session End time: 1550  Total time in minutes: 35   Types of Service: Individual psychotherapy  Interpretor:No. Interpretor Name and Language: n/a  Subjective: Barbara Dunn is a 53 y.o. female accompanied by  n/a Patient was referred by Kiki Pelton, MD for life stress. Patient reports the following symptoms/concerns: Continued life stress (son incarcerated, has to bring his BH medication that county will not provide due to funding; unable to keep grandchildren while they are involved with CPS; caring for clients; financial stress; waiting for results of recent labwork); pt is coping by keeping her faith, prayer, and [facility]  Duration of problem: Ongoing; Severity of problem: moderate  Objective: Mood: Anxious and Affect: Appropriate Risk of harm to self or others: No plan to harm self or others  Life Context: Family and Social: Pt lives by herself School/Work: Working part-time Self-Care: Estate agent class at [employer]; prayer Life Changes: Life stress  Patient and/or Family's Strengths/Protective Factors: Social connections, Concrete supports in place (healthy food, safe environments, etc.), and Sense of purpose  Goals Addressed: Patient will:  Reduce symptoms of: anxiety, depression, and stress   Increase knowledge and/or ability of: healthy habits and stress reduction   Demonstrate ability to: Increase healthy adjustment to current life circumstances, Increase adequate support systems for patient/family, and Increase motivation to adhere to plan of care  Progress towards Goals: Ongoing  Interventions: Interventions utilized:  Motivational Interviewing and Supportive Reflection Standardized Assessments  completed: Not Needed  Patient and/or Family Response: Patient agrees with treatment plan.   Patient Centered Plan: Patient is on the following Treatment Plan(s): IBH Assessment: Patient currently experiencing Adjustment disorder with mixed anxious and depressed mood; Grief; Psychosocial stress.   Patient may benefit from continued therapeutic intervention  .  Plan: Follow up with behavioral health clinician on : One month Behavioral recommendations:  -[facility] -Continue using daily self-coping strategies (prayer, relaxation breathing, worry time, setting healthy boundaries; paying down debt) -Continue time with loved ones as much as able (children, grandchildren, etc)  Referral(s): Integrated [employer] [facility] (In Clinic)  Elfredia Grippe [place], [place]     12/25/2023    5:39 PM 12/24/2023    1:34 PM 12/17/2023    1:36 PM 11/19/2023    3:30 PM 11/07/2023    5:08 PM  Depression screen PHQ 2/9  Decreased Interest 0 0 0 1 1  Down, Depressed, Hopeless 0 0 0 1 0  PHQ - 2 Score 0 0 0 2 1  Altered sleeping 1   1 1   Tired, decreased energy 1   2 1   Change in appetite 1   1 1   Feeling bad or failure about yourself  0   0 0  Trouble concentrating 0   0 0  Moving slowly or fidgety/restless 0   0 0  Suicidal thoughts 0   0 0  PHQ-9 Score 3   6 4       12/25/2023    5:40 PM 11/19/2023    3:30 PM 11/07/2023    5:08 PM 09/05/2023    4:18 PM  GAD 7 : Generalized Anxiety Score  Nervous, Anxious, on Edge 0 0 1 0  Control/stop worrying 1 1 1  1  Worry too much - different things 1 2 1 1   Trouble relaxing 1 1 1 1   Restless 0 0 0 0  Easily annoyed or irritable 0 1 0 1  Afraid - awful might happen 0 0 0 0  Total GAD 7 Score 3 5 4  4

## 2024-02-04 NOTE — Progress Notes (Signed)
 [facility] PREP Evaluation  Patient Details  Name: Barbara Dunn MRN: [id number] Date of Birth: [birth year] Age: 53 y.o. PCP: Hoy Register, MD  Vitals:   02/04/24 1457  BP: 126/76  Pulse: 75  SpO2: 99%  Weight: 193 lb (87.5 kg)     [facility] Eval - 02/04/24 1400       [facility] "PREP" Location   [facility] "PREP" Location [name]      Referral    Referring Provider [name]    Reason for referral High Cholesterol;Hypertension;Inactivity;Obesitity/Overweight    Program Start Date 02/11/24      Measurement   Waist Circumference 38 inches    Hip Circumference 46 inches    Body fat 40.5 percent      Information for Trainer   Goals --   Lose 10-15 pounds by end of program; lower cholesterol, better BP control; more active   Current Exercise none    Pertinent Medical History --   HTN, SVT, ashtma, DVT/PE     Timed Up and Go (TUGS)   Timed Up and Go Low risk <9 seconds      Mobility and Daily Activities   I find it easy to walk up or down two or more flights of stairs. 2    I have no trouble taking out the trash. 4    I do housework such as vacuuming and dusting on my own without difficulty. 4    I can easily lift a gallon of milk (8lbs). 4    I can easily walk a mile. 2    I have no trouble reaching into high cupboards or reaching down to pick up something from the floor. 2    I do not have trouble doing out-door work such as Loss adjuster, chartered, raking leaves, or gardening. 2      Mobility and Daily Activities   I feel younger than my age. 2    I feel independent. 3    I feel energetic. 1    I live an active life.  3    I feel strong. 2    I feel healthy. 1    I feel active as other people my age. 2      How fit and strong are you.   Fit and Strong Total Score 34            Past Medical History:  Diagnosis Date   Anemia    Arthritis    Asthma    Bronchitis    Chronic kidney disease    DVT (deep venous thrombosis) (HCC)    Hypertension    Kidney stone    Migraines     Obesity    Pulmonary embolism (HCC)    Past Surgical History:  Procedure Laterality Date   CARDIAC CATHETERIZATION N/A 08/02/2015   Procedure: Left Heart Cath and Coronary Angiography;  Surgeon: Lennette Bihari, MD;  Location: [facility];  Service: Cardiovascular;  Laterality: N/A;   CERVIX LESION DESTRUCTION     TUBAL LIGATION     TUBAL LIGATION     Social History   Tobacco Use  Smoking Status Former   Current packs/day: 0.00   Types: Cigarettes   Quit date: 12/15/2003   Years since quitting: 20.1  Smokeless Tobacco Never  Tobacco Comments   quit 10 years ago  To begin PREP program at [facility] on April 22, every T/Th at [employer] [facility] Landynn Dupler 02/04/2024, 3:00 PM

## 2024-02-05 LAB — KAPPA/LAMBDA LIGHT CHAINS
Kappa free light chain: 34.7 mg/L — ABNORMAL HIGH (ref 3.3–19.4)
Kappa, lambda light chain ratio: 2.57 — ABNORMAL HIGH (ref 0.26–1.65)
Lambda free light chains: 13.5 mg/L (ref 5.7–26.3)

## 2024-02-07 LAB — MULTIPLE MYELOMA PANEL, SERUM
Albumin SerPl Elph-Mcnc: 4.1 g/dL (ref 2.9–4.4)
Albumin/Glob SerPl: 1.6 (ref 0.7–1.7)
Alpha 1: 0.2 g/dL (ref 0.0–0.4)
Alpha2 Glob SerPl Elph-Mcnc: 0.7 g/dL (ref 0.4–1.0)
B-Globulin SerPl Elph-Mcnc: 0.9 g/dL (ref 0.7–1.3)
Gamma Glob SerPl Elph-Mcnc: 0.9 g/dL (ref 0.4–1.8)
Globulin, Total: 2.7 g/dL (ref 2.2–3.9)
IgA: 294 mg/dL (ref 87–352)
IgG (Immunoglobin G), Serum: 1164 mg/dL (ref 586–1602)
IgM (Immunoglobulin M), Srm: 28 mg/dL (ref 26–217)
M Protein SerPl Elph-Mcnc: 0.4 g/dL — ABNORMAL HIGH
Total Protein ELP: 6.8 g/dL (ref 6.0–8.5)

## 2024-02-11 ENCOUNTER — Inpatient Hospital Stay: Payer: 59 | Admitting: Physician Assistant

## 2024-02-11 VITALS — BP 122/77 | HR 61 | Temp 97.9°F | Resp 16 | Ht 62.5 in | Wt 191.4 lb

## 2024-02-11 DIAGNOSIS — D500 Iron deficiency anemia secondary to blood loss (chronic): Secondary | ICD-10-CM | POA: Diagnosis not present

## 2024-02-11 DIAGNOSIS — D472 Monoclonal gammopathy: Secondary | ICD-10-CM | POA: Diagnosis not present

## 2024-02-11 DIAGNOSIS — Z7901 Long term (current) use of anticoagulants: Secondary | ICD-10-CM | POA: Diagnosis not present

## 2024-02-11 DIAGNOSIS — Z87891 Personal history of nicotine dependence: Secondary | ICD-10-CM | POA: Diagnosis not present

## 2024-02-11 DIAGNOSIS — N920 Excessive and frequent menstruation with regular cycle: Secondary | ICD-10-CM | POA: Diagnosis not present

## 2024-02-11 DIAGNOSIS — Z86718 Personal history of other venous thrombosis and embolism: Secondary | ICD-10-CM | POA: Diagnosis not present

## 2024-02-11 NOTE — Progress Notes (Signed)
 [facility] [phone]) [phone]   [phone]  PROGRESS NOTE  Patient Care Team: Joaquin Mulberry, MD as PCP - General (Family Medicine) Sheryle Donning, MD as PCP - Cardiology (Cardiology) Dorothe Gaster, RD as Dietitian [facility])  Hematological/Oncological History # IgG Kappa MGUS # Iron  Deficiency Anemia 2/2 to GYN Bleeding #  Lower Extremity DVTs 05/31/2023: last visit with Dr. Veneta Gilding 11/11/2023: establish care with Dr. Rosaline Coma   Interval History:  Barbara Dunn 53 y.o. female with medical history significant for MGUS, IDA, and prior DVT who presents for a follow up visit. The patient's last visit was on 11/11/2023. In the interim since the last visit she received IV venofer  200 mg x 5 doses from 12/17/2023-01/14/2024  On exam today Barbara Dunn reports having ongoing fatigue but is able to complete her ADLs on her own. She is compliant with taking Eliquis  and denies any new signs or symptoms of a VTE. She continues to have heavy menstrual cycles lasting 5 days with 2 days of heavy bleeding. She reports that during her heavy days of bleeding, she uses up to 2 boxes of tampons. She is no longer on any hormone therapy due to history of DVTs. She denies fevers, chills, sweats, shortness of breath, chest pain, cough, bone pain, headaches or dizziness.  A full 10 point ROS is otherwise negative.  MEDICAL HISTORY:  Past Medical History:  Diagnosis Date   Anemia    Arthritis    Asthma    Bronchitis    Chronic kidney disease    DVT (deep venous thrombosis) (HCC)    Hypertension    Kidney stone    Migraines    Obesity    Pulmonary embolism (HCC)     SURGICAL HISTORY: Past Surgical History:  Procedure Laterality Date   CARDIAC CATHETERIZATION N/A 08/02/2015   Procedure: Left Heart Cath and Coronary Angiography;  Surgeon: Millicent Ally, MD;  Location: [facility] INV[facility];  Service: Cardiovascular;  Laterality: N/A;   CERVIX LESION DESTRUCTION      TUBAL LIGATION     TUBAL LIGATION      SOCIAL HISTORY: Social History   Socioeconomic History   Marital status: Single    Spouse name: Not on file   Number of children: Not on file   Years of education: Not on file   Highest education level: Not on file  Occupational History   Not on file  Tobacco Use   Smoking status: Former    Current packs/day: 0.00    Types: Cigarettes    Quit date: 12/15/2003    Years since quitting: 20.1   Smokeless tobacco: Never   Tobacco comments:    quit 10 years ago  Vaping Use   Vaping status: Never Used  Substance and Sexual Activity   Alcohol use: Yes    Alcohol/week: 2.0 standard drinks of alcohol    Types: 1 Glasses of wine, 1 Shots of liquor per week    Comment: Once every 3 months    Drug use: No   Sexual activity: Yes    Birth control/protection: Other-see comments    Comment: tubal  Other Topics Concern   Not on file  Social History Narrative   Pt has GED. CNA as well.    Social Drivers of Health   Financial Resource Strain: Medium Risk (11/19/2023)   Overall Financial Resource Strain (CARDIA)    Difficulty of Paying Living Expenses: Somewhat hard  Food Insecurity: Food Insecurity Present (11/19/2023)  Hunger Vital Sign    Worried About Running Out of Food in the Last Year: Sometimes true    Ran Out of Food in the Last Year: Sometimes true  Transportation Needs: No Transportation Needs (11/19/2023)   PRAPARE - Administrator, Civil Service (Medical): No    Lack of Transportation (Non-Medical): No  Physical Activity: Inactive (11/19/2023)   Exercise Vital Sign    Days of Exercise per Week: 0 days    Minutes of Exercise per Session: 0 min  Stress: Stress Concern Present (11/19/2023)   [employer] of Occupational Health - Occupational Stress Questionnaire    Feeling of Stress : To some extent  Social Connections: Moderately Isolated (11/19/2023)   Social Connection and Isolation Panel [NHANES]    Frequency of  Communication with Friends and Family: Three times a week    Frequency of Social Gatherings with Friends and Family: Once a week    Attends Religious Services: Never    Database administrator or Organizations: No    Attends Engineer, structural: 1 to 4 times per year    Marital Status: Never married  Intimate Partner Violence: Not At Risk (11/19/2023)   Humiliation, Afraid, Rape, and Kick questionnaire    Fear of Current or Ex-Partner: No    Emotionally Abused: No    Physically Abused: No    Sexually Abused: No    FAMILY HISTORY: Family History  Problem Relation Age of Onset   Hypertension Mother    Schizophrenia Mother    Schizophrenia Sister    Schizophrenia Brother    Schizophrenia Brother    Schizophrenia Son    Colon cancer Neg Hx    Esophageal cancer Neg Hx    Colon polyps Neg Hx    Rectal cancer Neg Hx    Stomach cancer Neg Hx     ALLERGIES:  has no known allergies.  MEDICATIONS:  Current Outpatient Medications  Medication Sig Dispense Refill   albuterol  (VENTOLIN  HFA) 108 (90 Base) MCG/ACT inhaler Inhale 1 puff into the lungs every 6 (six) hours as needed for wheezing and shortness of breath 6.7 g 1   amLODipine  (NORVASC ) 2.5 MG tablet Take 1 tablet (2.5 mg total) by mouth daily. 90 tablet 1   apixaban  (ELIQUIS ) 5 MG TABS tablet Take 1 tablet (5 mg total) by mouth 2 (two) times daily. Start taking after completion of starter pack. 60 tablet 5   APPLE CIDER VINEGAR PO Take 1 capsule by mouth daily.     BIOTIN PO Take 2 tablets by mouth daily.      cetirizine (ZYRTEC) 10 MG tablet Take 10 mg by mouth daily.     [employer] FIBER GUMMY BEARS CHILDREN PO Take by mouth. Also contains B12, vitamin C, and vitamin D      docusate sodium (COLACE) 100 MG capsule Take 100 mg by mouth as needed.      ELDERBERRY PO Take by mouth.     fluticasone  (FLONASE ) 50 MCG/ACT nasal spray PLACE 2 SPRAYS INTO BOTH NOSTRILS DAILY. 16 g 2   fluticasone  (FLOVENT  HFA) 110 MCG/ACT inhaler  INHALE 1 PUFF INTO THE LUNGS 2 (TWO) TIMES DAILY. 12 g 2   hydrOXYzine  (ATARAX ) 25 MG tablet Take 1 tablet (25 mg total) by mouth at bedtime as needed. 90 tablet 1   lisinopril  (ZESTRIL ) 20 MG tablet Take 1 tablet (20 mg total) by mouth daily. 90 tablet 1   Magnesium 250 MG TABS Take 250 mg by mouth  daily.     meclizine  (ANTIVERT ) 25 MG tablet Take 1 tablet (25 mg total) by mouth 3 (three) times daily as needed for dizziness. 60 tablet 1   Multiple Vitamins-Minerals (MULTIVITAMIN ADULT PO) Take 1 tablet by mouth daily.      oxymetazoline  (AFRIN NASAL SPRAY) 0.05 % nasal spray Place 1 spray into both nostrils 2 (two) times daily. 30 mL 0   rosuvastatin  (CRESTOR ) 10 MG tablet Take 1 tablet (10 mg total) by mouth daily. 90 tablet 3   SUMAtriptan  (IMITREX ) 100 MG tablet Take 1 tablet (100 mg total) by mouth once for 1 dose at the onset of a migraine. May repeat 1 tablet in 2 hours if migraine persists.  Maximum daily dose 200 mg 10 tablet 6   topiramate  (TOPAMAX ) 50 MG tablet Take 1 tablet (50 mg total) by mouth 2 (two) times daily. 180 tablet 1   triamcinolone  cream (KENALOG ) 0.1 % APPLY 1 APPLICATION TOPICALLY 2 (TWO) TIMES DAILY. 30 g 2   vitamin C (ASCORBIC ACID) 250 MG tablet Take 250 mg by mouth daily.     doxycycline  (VIBRAMYCIN ) 100 MG capsule Take 1 capsule (100 mg total) by mouth 2 (two) times daily. (Patient not taking: Reported on 02/11/2024) 20 capsule 0   Iron , Ferrous Sulfate , 325 (65 Fe) MG TABS Take 325 mg by mouth 2 (two) times daily. (Patient not taking: Reported on 02/11/2024) 60 tablet 3   metroNIDAZOLE  (FLAGYL ) 500 MG tablet Take 1 tablet (500 mg total) by mouth 2 (two) times daily. (Patient not taking: Reported on 02/11/2024) 14 tablet 0   No current facility-administered medications for this visit.    REVIEW OF SYSTEMS:   Constitutional: ( - ) fevers, ( - )  chills , ( - ) night sweats Eyes: ( - ) blurriness of vision, ( - ) double vision, ( - ) watery eyes Ears, nose, mouth,  throat, and face: ( - ) mucositis, ( - ) sore throat Respiratory: ( - ) cough, ( - ) dyspnea, ( - ) wheezes Cardiovascular: ( - ) palpitation, ( - ) chest discomfort, ( - ) lower extremity swelling Gastrointestinal:  ( - ) nausea, ( - ) heartburn, ( - ) change in bowel habits Skin: ( - ) abnormal skin rashes Lymphatics: ( - ) new lymphadenopathy, ( - ) easy bruising Neurological: ( - ) numbness, ( - ) tingling, ( - ) new weaknesses Behavioral/Psych: ( - ) mood change, ( - ) new changes  All other systems were reviewed with the patient and are negative.  PHYSICAL EXAMINATION: Vitals:   02/11/24 1359  BP: 122/77  Pulse: 61  Resp: 16  Temp: 97.9 F (36.6 C)  SpO2: 100%   Filed Weights   02/11/24 1359  Weight: 191 lb 6.4 oz (86.8 kg)    GENERAL: Well-appearing middle-age African-American female, alert, no distress and comfortable SKIN: skin color, texture, turgor are normal, no rashes or significant lesions EYES: conjunctiva are pink and non-injected, sclera clear LUNGS: clear to auscultation and percussion with normal breathing effort HEART: regular rate & rhythm and no murmurs and no lower extremity edema Musculoskeletal: no cyanosis of digits and no clubbing  PSYCH: alert & oriented x 3, fluent speech NEURO: no focal motor/sensory deficits  LABORATORY DATA:  I have reviewed the data as listed    Latest Ref Rng & Units 02/04/2024   12:57 PM 12/29/2023    1:57 PM 12/21/2023   12:49 PM  CBC  WBC 4.0 -  10.5 K/uL 6.9  6.5  7.1   Hemoglobin 12.0 - 15.0 g/dL 25.3  66.4  40.3   Hematocrit 36.0 - 46.0 % 39.7  39.8  42.2   Platelets 150 - 400 K/uL 210  276  244        Latest Ref Rng & Units 02/04/2024   12:57 PM 12/29/2023    1:57 PM 12/21/2023   12:49 PM  CMP  Glucose 70 - 99 mg/dL 78  90  71   BUN 6 - 20 mg/dL 12  14  12    Creatinine 0.44 - 1.00 mg/dL 4.74  2.59  5.63   Sodium 135 - 145 mmol/L 141  140  137   Potassium 3.5 - 5.1 mmol/L 3.7  4.2  3.5   Chloride 98 - 111 mmol/L  112  108  106   CO2 22 - 32 mmol/L 24  22  22    Calcium  8.9 - 10.3 mg/dL 9.2  9.1  9.0   Total Protein 6.5 - 8.1 g/dL 7.4  6.6    Total Bilirubin 0.0 - 1.2 mg/dL 0.6  0.5    Alkaline Phos 38 - 126 U/L 51  43    AST 15 - 41 U/L 17  25    ALT 0 - 44 U/L 15  17      Lab Results  Component Value Date   MPROTEIN 0.4 (H) 02/04/2024   MPROTEIN 0.5 (H) 11/11/2023   MPROTEIN 0.7 (H) 03/29/2023   Lab Results  Component Value Date   KPAFRELGTCHN 34.7 (H) 02/04/2024   KPAFRELGTCHN 37.6 (H) 11/11/2023   KPAFRELGTCHN 32.0 (H) 03/29/2023   LAMBDASER 13.5 02/04/2024   LAMBDASER 12.6 11/11/2023   LAMBDASER 10.1 03/29/2023   KAPLAMBRATIO 2.57 (H) 02/04/2024   KAPLAMBRATIO 2.98 (H) 11/11/2023   KAPLAMBRATIO 3.17 (H) 03/29/2023    RADIOGRAPHIC STUDIES: I have personally reviewed the radiological images as listed and agreed with the findings in the report. No results found.   ASSESSMENT & PLAN Barbara Dunn is a 53 y.o. female with medical history significant for MGUS, IDA, and prior DVT who presents for a follow up visit.   # IgG Kappa MGUS -- Labs from 02/04/2024 detected M protein measuring 0.4 g/dL. Kappa light chain stable at 34.7, ratio 2.57. No cytopenias detected. Creatinine mildly above normal at 1.12 and normal calcium  level.  --Bone met survey due and scheduled for 02/13/2024.  --Pending bone met survey, no clinical signs of transformation to active myeloma.  --Continue with surveillance and repeat labs in 6 months.   # Iron  Deficiency Anemia 2/2 to GYN Bleeding --Continue ferrous sulfate  325 mg p.o. daily --Last received IV venofer  200 mg x 5 doses from 12/17/2023-01/14/2024.  -- Labs from 44/15/2025 showed no evidence of anemia with Hgb 13.0, MCV 90.8. Iron  panel shows no deficiency with ferritin 230, iron  128, TIBC 253, saturation 51%. --No need for additional IV iron  at this time.  --Repeat labs in 3 months for continued monitoring.   # Lower Extremity DVTs --First episode was  secondary to birth control, estrogen based.  S --Subsequent blood clot was unprovoked in nature. -- Continue Eliquis  5 mg twice daily. --Workup from 02/29/2024 including cardiolipin abs, beta-2 -glycoprotein abs, factor 5 leiden and prothrombin gene. All negative.  --  Recommend indefinite anticoagulation.   Orders Placed This Encounter  Procedures   DG Bone Survey Met    Standing Status:   Future    Expected Date:   02/18/2024    Expiration  Date:   02/10/2025    Reason for Exam (SYMPTOM  OR DIAGNOSIS REQUIRED):   MGUS, evaluate for bone lesion    Is patient pregnant?:   No    Preferred imaging location?:   [facility]    All questions were answered. The patient knows to call the clinic with any problems, questions or concerns.  I have spent a total of 25 minutes minutes of face-to-face and non-face-to-face time, preparing to see the patient, performing a medically appropriate examination, counseling and educating the patient, ordering medications/tests/procedures, documenting clinical information in the electronic health record, independently interpreting results and communicating results to the patient, and care coordination.   Wyline Hearing PA-C Dept of Hematology and Oncology [facility] Phone: [phone]   02/11/2024 4:21 PM

## 2024-02-13 ENCOUNTER — Ambulatory Visit (HOSPITAL_COMMUNITY)
Admission: RE | Admit: 2024-02-13 | Discharge: 2024-02-13 | Disposition: A | Discharge status: Home / Self Care | Source: Ambulatory Visit | Attending: Physician Assistant | Admitting: Physician Assistant

## 2024-02-13 DIAGNOSIS — M47819 Spondylosis without myelopathy or radiculopathy, site unspecified: Secondary | ICD-10-CM | POA: Diagnosis not present

## 2024-02-13 DIAGNOSIS — D472 Monoclonal gammopathy: Secondary | ICD-10-CM | POA: Diagnosis not present

## 2024-02-13 DIAGNOSIS — M170 Bilateral primary osteoarthritis of knee: Secondary | ICD-10-CM | POA: Diagnosis not present

## 2024-02-17 ENCOUNTER — Ambulatory Visit (INDEPENDENT_AMBULATORY_CARE_PROVIDER_SITE_OTHER): Admitting: Clinical

## 2024-02-17 DIAGNOSIS — F4323 Adjustment disorder with mixed anxiety and depressed mood: Secondary | ICD-10-CM | POA: Diagnosis not present

## 2024-02-17 DIAGNOSIS — Z658 Other specified problems related to psychosocial circumstances: Secondary | ICD-10-CM

## 2024-02-17 DIAGNOSIS — F4321 Adjustment disorder with depressed mood: Secondary | ICD-10-CM

## 2024-02-18 ENCOUNTER — Other Ambulatory Visit: Payer: Self-pay

## 2024-02-18 ENCOUNTER — Other Ambulatory Visit (HOSPITAL_COMMUNITY): Payer: Self-pay

## 2024-02-18 NOTE — Progress Notes (Signed)
 [facility] PRE[facility] Weekly Session  Patient Details  Name: Barbara Dunn MRN: [id number] Date of Birth: Sep 17, 1971 Age: 53 y.o. PCP: Newlin, Enobong, MD  There were no vitals filed for this visit.   [facility] Weekly seesion - 02/18/24 1500       [facility] "PREP" Location   [facility] "PREP" Location [name]      Weekly Session   Topic Discussed Goal setting and welcome to the program   Introductions, reivew of notebook, tour of facility, offered option to work out on cardio machine   Classes attended to date 1             Jim Motts 02/18/2024, 3:29 PM

## 2024-02-25 NOTE — Progress Notes (Signed)
 [facility] PRE[facility] Weekly Session  Patient Details  Name: Barbara Dunn MRN: [id number] Date of Birth: December 01, 1970 Age: 53 y.o. PCP: Joaquin Mulberry, MD  Vitals:   02/25/24 1525  Weight: 193 lb 12.8 oz (87.9 kg)     [facility] Weekly seesion - 02/25/24 1500       [facility] "PREP" Location   [facility] "PREP" Location [name]      Weekly Session   Topic Discussed Importance of resistance training;Other ways to be active   Goal: work up to 150 min of cardio/wk and strength training, start at 2, work up to 3, for 20-40 minutes   Classes attended to date 3             Barbara Dunn 02/25/2024, 3:25 PM

## 2024-02-26 ENCOUNTER — Encounter: Payer: Self-pay | Admitting: Physician Assistant

## 2024-03-03 NOTE — Progress Notes (Signed)
 [facility] PRE[facility] Weekly Session  Patient Details  Name: Jerae Tutor MRN: [id number] Date of Birth: 07/14/1971 Age: 53 y.o. PCP: Joaquin Mulberry, MD  Vitals:   03/03/24 1519  Weight: 195 lb 6.4 oz (88.6 kg)     [facility] Weekly seesion - 03/03/24 1500       [facility] "PREP" Location   [facility] "PREP" Location [name]      Weekly Session   Topic Discussed Healthy eating tips   Foods to reduce, foods to increase, eat the rainbow of colors; introduced to [employer] app.   Minutes exercised this week 90 minutes    Classes attended to date 5             Kalan Yeley B Khalise Billard 03/03/2024, 3:20 PM

## 2024-03-10 NOTE — Progress Notes (Signed)
 [facility] PRE[facility] Weekly Session  Patient Details  Name: Barbara Dunn MRN: [id number] Date of Birth: [birth year] Age: 53 y.o. PCP: Joaquin Mulberry, MD  Vitals:   03/10/24 1520  Weight: 192 lb 12.8 oz (87.5 kg)     [facility] Weekly seesion - 03/10/24 1500       [facility] "PREP" Location   [facility] "PREP" Location [name]      Weekly Session   Topic Discussed Health habits   Limit added sugars, women 24 grams, men 36 grams a day: sugar demo   Minutes exercised this week 169 minutes    Classes attended to date [address] 03/10/2024, 3:24 PM

## 2024-03-12 ENCOUNTER — Other Ambulatory Visit (HOSPITAL_COMMUNITY): Payer: Self-pay

## 2024-03-17 NOTE — Progress Notes (Signed)
 [facility] PRE[facility] Weekly Session  Patient Details  Name: Barbara Dunn MRN: [id number] Date of Birth: 08-18-1971 Age: 53 y.o. PCP: Joaquin Mulberry, MD  Vitals:   03/17/24 1511  Weight: 191 lb 6.4 oz (86.8 kg)     [facility] Weekly seesion - 03/17/24 1500       [facility] "PREP" Location   [facility] "PREP[facility] Location [name]      Weekly Session   Topic Discussed Restaurant Eating   Sodium demo, healthy seasoning options   Minutes exercised this week 154 minutes    Classes attended to date [address] 03/17/2024, 3:12 PM

## 2024-03-18 ENCOUNTER — Ambulatory Visit (INDEPENDENT_AMBULATORY_CARE_PROVIDER_SITE_OTHER): Admitting: Clinical

## 2024-03-18 DIAGNOSIS — Z658 Other specified problems related to psychosocial circumstances: Secondary | ICD-10-CM

## 2024-03-18 DIAGNOSIS — F4323 Adjustment disorder with mixed anxiety and depressed mood: Secondary | ICD-10-CM

## 2024-03-18 NOTE — BH Specialist Note (Unsigned)
 Integrated Behavioral Health Follow Up In-Person Visit  MRN: [id number] Name: Barbara Dunn  Number of Integrated Behavioral Health Clinician visits: 6-Sixth Visit  Session Start time: 1530   Session End time: 1615  Total time in minutes: 45   Types of Service: Individual psychotherapy and Video visit  Interpretor:No. Interpretor Name and Language: n/a  Subjective: Barbara Dunn is a 53 y.o. female accompanied by n/a Patient was referred by Kiki Pelton, MD for life stress. Patient reports the following symptoms/concerns: Continued life stress (unable to see some grandchildren while they are CPS involved, car problems, and having to lower work load to [facility], leading to further financial stress).  Duration of problem: Ongoing; Severity of problem: moderate  Objective: Mood: NA and Affect: Appropriate Risk of harm to self or others: No plan to harm self or others  Life Context: Family and Social: Pt lives by herself School/Work: Working part-time  Self-Care: [facility]; prayer Life Changes: ongoing life stress  Patient and/or Family's Strengths/Protective Factors: Social connections, Concrete supports in place (healthy food, safe environments, etc.), Sense of purpose, and Physical Health (exercise, healthy diet, medication compliance, etc.)  Goals Addressed: Patient will:  Reduce symptoms of: anxiety, depression, and stress   Increase knowledge and/or ability of: healthy habits and stress reduction   Demonstrate ability to: Increase healthy adjustment to current life circumstances  Progress towards Goals: Ongoing  Interventions: Interventions utilized:  Motivational Interviewing and Supportive Reflection Standardized Assessments completed: Not Needed  Patient and/or Family Response: Patient agrees with treatment plan.   Patient Centered Plan: Patient is on the following Treatment Plan(s):  IBH  Assessment: Patient currently experiencing Adjustment disorder with mixed anxious and depressed mood; Psychosocial stress.   Patient may benefit from continued therapeutic intervention  .  Plan: Follow up with behavioral health clinician on : One month Behavioral recommendations:  -Continue [facility] -Continue plan to obtain new clients after [facility] classes end -Continue daily self-coping strategies (positive outlook, prayer, relaxation breathing, worry time, setting healthy boundaries; paying down debt when increased work resumes; time with children and grandchildren as able) Referral(s): Integrated [employer] [facility] (In Clinic)  Elfredia Grippe [place], [place]     12/25/2023    5:39 PM 12/24/2023    1:34 PM 12/17/2023    1:36 PM 11/19/2023    3:30 PM 11/07/2023    5:08 PM  Depression screen PHQ 2/9  Decreased Interest 0 0 0 1 1  Down, Depressed, Hopeless 0 0 0 1 0  PHQ - 2 Score 0 0 0 2 1  Altered sleeping 1   1 1   Tired, decreased energy 1   2 1   Change in appetite 1   1 1   Feeling bad or failure about yourself  0   0 0  Trouble concentrating 0   0 0  Moving slowly or fidgety/restless 0   0 0  Suicidal thoughts 0   0 0  PHQ-9 Score 3   6 4       12/25/2023    5:40 PM 11/19/2023    3:30 PM 11/07/2023    5:08 PM 09/05/2023    4:18 PM  GAD 7 : Generalized Anxiety Score  Nervous, Anxious, on Edge 0 0 1 0  Control/stop worrying 1 1 1 1   Worry too much - different things 1 2 1 1   Trouble relaxing 1 1 1 1   Restless 0 0 0 0  Easily annoyed or irritable 0 1 0 1  Afraid - awful might happen 0 0 0 0  Total GAD 7 Score 3 5 4  4

## 2024-03-23 ENCOUNTER — Other Ambulatory Visit (HOSPITAL_COMMUNITY): Payer: Self-pay

## 2024-03-23 ENCOUNTER — Other Ambulatory Visit: Payer: Self-pay | Admitting: Family Medicine

## 2024-03-23 DIAGNOSIS — J302 Other seasonal allergic rhinitis: Secondary | ICD-10-CM

## 2024-03-24 ENCOUNTER — Other Ambulatory Visit (HOSPITAL_COMMUNITY): Payer: Self-pay

## 2024-03-24 MED ORDER — ALBUTEROL SULFATE HFA 108 (90 BASE) MCG/ACT IN AERS
1.0000 | INHALATION_SPRAY | Freq: Four times a day (QID) | RESPIRATORY_TRACT | 1 refills | Status: DC | PRN
  Filled 2024-03-24: qty 6.7, 30d supply, fill #0
  Filled 2024-07-29: qty 6.7, 30d supply, fill #1

## 2024-03-24 NOTE — Progress Notes (Signed)
 [facility] Weekly Session  Patient Details  Name: Barbara Dunn MRN: [id number] Date of Birth: 1971/04/04 Age: 53 y.o. PCP: Joaquin Mulberry, MD  Vitals:   03/24/24 1510  Weight: 189 lb 6.4 oz (85.9 kg)     [facility] Weekly seesion - 03/24/24 1500       [facility] "PREP" Location   [facility] "PREP[facility] Location [name]      Weekly Session   Topic Discussed Stress management and problem solving   Electronic version of better sleep guide book offered, encougrage used of CPAP for sleep apnea, fingertip breath work example, baby to bed concept discussed   Minutes exercised this week 110 minutes    Classes attended to date [address] 03/24/2024, 3:12 PM

## 2024-03-27 DIAGNOSIS — Z8249 Family history of ischemic heart disease and other diseases of the circulatory system: Secondary | ICD-10-CM | POA: Diagnosis not present

## 2024-03-27 DIAGNOSIS — M545 Low back pain, unspecified: Secondary | ICD-10-CM | POA: Diagnosis not present

## 2024-03-27 DIAGNOSIS — Z833 Family history of diabetes mellitus: Secondary | ICD-10-CM | POA: Diagnosis not present

## 2024-03-27 DIAGNOSIS — M1990 Unspecified osteoarthritis, unspecified site: Secondary | ICD-10-CM | POA: Diagnosis not present

## 2024-03-27 DIAGNOSIS — G629 Polyneuropathy, unspecified: Secondary | ICD-10-CM | POA: Diagnosis not present

## 2024-03-27 DIAGNOSIS — E669 Obesity, unspecified: Secondary | ICD-10-CM | POA: Diagnosis not present

## 2024-03-27 DIAGNOSIS — Z823 Family history of stroke: Secondary | ICD-10-CM | POA: Diagnosis not present

## 2024-03-27 DIAGNOSIS — J4489 Other specified chronic obstructive pulmonary disease: Secondary | ICD-10-CM | POA: Diagnosis not present

## 2024-03-27 DIAGNOSIS — I10 Essential (primary) hypertension: Secondary | ICD-10-CM | POA: Diagnosis not present

## 2024-03-27 DIAGNOSIS — I700 Atherosclerosis of aorta: Secondary | ICD-10-CM | POA: Diagnosis not present

## 2024-03-27 DIAGNOSIS — E785 Hyperlipidemia, unspecified: Secondary | ICD-10-CM | POA: Diagnosis not present

## 2024-03-27 DIAGNOSIS — Z87891 Personal history of nicotine dependence: Secondary | ICD-10-CM | POA: Diagnosis not present

## 2024-03-31 NOTE — Progress Notes (Signed)
 [facility] PRE[facility] Weekly Session  Patient Details  Name: Barbara Dunn MRN: [id number] Date of Birth: [birth year] Age: 53 y.o. PCP: Joaquin Mulberry, MD  Vitals:   03/31/24 1449  Weight: 191 lb (86.6 kg)     [facility] Weekly seesion - 03/31/24 1400       [facility] "PREP" Location   [facility] "PREP[facility] Location [name]      Weekly Session   Topic Discussed Hitting roadblocks   Dealing with negative people, celebrating non-scale victories   Minutes exercised this week 160 minutes    Classes attended to date 13             [facility] 03/31/2024, 2:50 PM

## 2024-04-05 ENCOUNTER — Other Ambulatory Visit: Payer: Self-pay | Admitting: Student-PharmD

## 2024-04-05 ENCOUNTER — Other Ambulatory Visit: Payer: Self-pay | Admitting: Family Medicine

## 2024-04-06 ENCOUNTER — Other Ambulatory Visit (HOSPITAL_COMMUNITY): Payer: Self-pay

## 2024-04-06 MED ORDER — TRIAMCINOLONE ACETONIDE 0.1 % EX CREA
TOPICAL_CREAM | Freq: Two times a day (BID) | CUTANEOUS | 2 refills | Status: AC
  Filled 2024-04-06: qty 30, 15d supply, fill #0
  Filled 2024-07-26: qty 30, 15d supply, fill #1
  Filled 2024-10-11: qty 30, 15d supply, fill #2

## 2024-04-07 DIAGNOSIS — Z5181 Encounter for therapeutic drug level monitoring: Secondary | ICD-10-CM | POA: Diagnosis not present

## 2024-04-07 DIAGNOSIS — I700 Atherosclerosis of aorta: Secondary | ICD-10-CM | POA: Diagnosis not present

## 2024-04-07 DIAGNOSIS — E785 Hyperlipidemia, unspecified: Secondary | ICD-10-CM | POA: Diagnosis not present

## 2024-04-07 DIAGNOSIS — I10 Essential (primary) hypertension: Secondary | ICD-10-CM | POA: Diagnosis not present

## 2024-04-07 NOTE — Progress Notes (Signed)
 [facility] PRE[facility] Weekly Session  Patient Details  Name: Laurisa Sahakian MRN: [id number] Date of Birth: 27-Feb-1971 Age: 53 y.o. PCP: Joaquin Mulberry, MD  Vitals:   04/07/24 1542  Weight: 189 lb 3.2 oz (85.8 kg)     [facility] Weekly seesion - 04/07/24 1500       [facility] PREP Location   [facility] PREP Location [name]      Weekly Session   Topic Discussed Other   Portion control; review of Red [employer]is[employer] nutrition label; visualize your portion size demo   Minutes exercised this week 89 minutes    Classes attended to date 65          Bomani Oommen B Tayelor Osborne 04/07/2024, 3:44 PM

## 2024-04-08 ENCOUNTER — Ambulatory Visit (HOSPITAL_BASED_OUTPATIENT_CLINIC_OR_DEPARTMENT_OTHER): Payer: Self-pay | Admitting: Family

## 2024-04-08 LAB — HEPATIC FUNCTION PANEL
ALT: 14 IU/L (ref 0–32)
AST: 21 IU/L (ref 0–40)
Albumin: 4.1 g/dL (ref 3.8–4.9)
Alkaline Phosphatase: 61 IU/L (ref 44–121)
Bilirubin Total: 0.5 mg/dL (ref 0.0–1.2)
Bilirubin, Direct: 0.2 mg/dL (ref 0.00–0.40)
Total Protein: 6.5 g/dL (ref 6.0–8.5)

## 2024-04-08 LAB — LIPID PANEL
Chol/HDL Ratio: 2.1 ratio (ref 0.0–4.4)
Cholesterol, Total: 149 mg/dL (ref 100–199)
HDL: 71 mg/dL (ref >39)
LDL Chol Calc (NIH): 64 mg/dL (ref 0–99)
Triglycerides: 70 mg/dL (ref 0–149)
VLDL Cholesterol Cal: 14 mg/dL (ref 5–40)

## 2024-04-13 ENCOUNTER — Other Ambulatory Visit (HOSPITAL_COMMUNITY): Payer: Self-pay

## 2024-04-13 ENCOUNTER — Telehealth: Payer: Self-pay | Admitting: Student-PharmD

## 2024-04-13 MED ORDER — APIXABAN 5 MG PO TABS
5.0000 mg | ORAL_TABLET | Freq: Two times a day (BID) | ORAL | 0 refills | Status: DC
  Filled 2024-04-13: qty 60, 30d supply, fill #0

## 2024-04-13 NOTE — Telephone Encounter (Signed)
 Patient presented to [facility] today saying she is about to run out of Eliquis . We were managing her initial treatment for DVT and referred her to hematology for long-term anticoagulation management, who she last saw 02/11/24. Patient says the pharmacy requested refills but none have come through to approve - unclear if this is a discrepancy as a result of the move to the new H&V building/new Epic context. Sent in a 30 day refill of Eliquis  today (preferred pharmacy is [facility]) to prevent her from running out of Eliquis  and will route to hematologist as an Financial planner. Patient aware future refills will need to come from hematology and she may need to reach out to their office.

## 2024-04-14 NOTE — BH Specialist Note (Deleted)
 ADULT Comprehensive Clinical Assessment (CCA) Note   04/14/2024 Barbara Dunn [id number]   Referring Provider: *** Session Start time: 1530    Session End time: 1615  Total time in minutes: 45    SUBJECTIVE: Barbara Dunn is a 53 y.o.   female accompanied by {CHL AMB ACCOMPANIED BY:[phone]}  Barbara Dunn was seen in consultation at the request of Delbert Clam, MD for evaluation of {CHL AMB PED BEHAVIORAL LEARNING PROBLEMS:210130101}.  Types of Service: Comprehensive Clinical Assessment (CCA)  Reason for referral in patient/family's own words:  ***    She likes to be called ***.  She came to the appointment with {CHL AMB ACCOMPANIED AB:[id number]}.  Primary language at home is {CHL AMB BASIC LANGUAGE [phone]}  Constitutional Appearance: {CHL AMB PED CONSTITUTIONAL:210130113}, well-nourished, well-developed, alert and well-appearing  (Patient to answer as appropriate) Gender identity: *** Sex assigned at birth: *** Pronouns: {he/she/they:23295}   Mental status exam:   General Appearance /Behavior:  {BHH GENERALAPPEARANCE/BEHAVIOR:22300} Eye Contact:  {BHH EYE CONTACT:22301} Motor Behavior:  {BHH MOTOR BEHAVIOR:22302} Speech:  {BHH SPEECH:22304} Level of Consciousness:  {BHH LEVEL OF CONSCIOUSNESS:22305} Mood:  {BHH MOOD:22306} Affect:  {BHH AFFECT:22307} Anxiety Level:  {BHH ANXIETY LEVEL:22308} Thought Process:  {BHH THOUGHT PROCESS:22309} Thought Content:  {BHH THOUGHT CONTENT:22310} Perception:  {BHH PERCEPTION:22311} Judgment:  {BHH JUDGMENT:22312} Insight:  {BHH INSIGHT:22313}   Current Medications and therapies: She is taking:  {CHL AMB TAKING MEDICATIONS:220130102}   Therapies:  {CHL AMB THERAPIES:[phone]}  Family history: Family mental illness:  {CHL AMB FAMILY MENTAL ILLNESS:7271339140} Family school achievement history:  {CHL AMB FAMILY SCHOOL ACHIEVEMENT HISTORY:9036008842} Other relevant family history:  {CHL AMB OTHER RELEVANT FAMILY  HISTORY:210130114}  Social History: Now living with {CHL AMB LIVING TPUY:7898698971}. {CHL AMB PED PARENT/GUARDIAN RELATIONS:210130115}. Employment:  {CHL AMB PARENT/GUARDIAN EMPLOYMENT:2154592418} Main caregiver's health:  {CHL AMB CAREGIVER HEALTH:9596596179} Religious or Spiritual Beliefs: ***  Mood: She {CHL AMB PARENTS MOOD CONCERNS:[phone]}. {CHL AMB [phone]}  Negative Mood Concerns {CHL AMB NEGATIVE THOUGHTS:210130169}. Self-injury:  {CHL AMB DID NOT JDX:789869825} Suicidal ideation:  {CHL AMB DID NOT JDX:789869825} Suicide attempt:  {CHL AMB DID NOT JDX:789869825}  Additional Anxiety Concerns: Panic attacks:  {CHL AMB YES/NO/NOT APPLICABLE:210130111} Obsessions:  {CHL AMB YES/NO/NOT APPLICABLE:210130111} Compulsions:  {CHL AMB YES/NO/NOT APPLICABLE:210130111}  Stressors:  {CHL AMB BH STRESSORS:[phone]}  Alcohol and/or Substance Use: Have you recently consumed alcohol? {YES/NO/WILD RJMID:81418}  Have you recently used any drugs?  {YES/NO/WILD RJMID:81418}  Have you recently consumed any tobacco? {YES/NO/WILD CARDS:18581} Does patient seem concerned about dependence or abuse of any substance? {YES/NO/WILD RJMID:81418}  Substance Use Disorder Checklist:  {CHL AMB BH CHECKLIST FOR SUBSTANCE USE DISORDER:[phone]}  Severity Risk Scoring based on DSM-5 Criteria for Substance Use Disorder. The presence of at least two (2) criteria in the last 12 months indicate a substance use disorder. The severity of the substance use disorder is defined as:  Mild: Presence of 2-3 criteria Moderate: Presence of 4-5 criteria Severe: Presence of 6 or more criteria  Traumatic Experiences: History or current traumatic events (natural disaster, house fire, etc.)? {YES/NO/WILD RJMID:81418} History or current physical trauma?  {YES/NO/WILD RJMID:81418} History or current emotional trauma?  {YES/NO/WILD RJMID:81418} History or current sexual trauma?  {YES/NO/WILD  RJMID:81418} History or current domestic or intimate partner violence?  {YES/NO/WILD RJMID:81418} History of bullying:  {YES/NO/WILD CARDS:18581}  Risk Assessment: Suicidal or homicidal thoughts?   {YES/NO/WILD RJMID:79211} Self injurious behaviors?  {YES/NO/WILD RJMID:79211} Guns in the home?  {YES/NO/WILD RJMID:79211}  Self Harm Risk Factors: {CHL AMB BH SELF HARM RISK FACTORS:[phone]}  Self Harm Thoughts?: {CHL AMB BH SELF HARM THOUGHTS:[phone]}  Patient and/or Family's Strengths/Protective Factors: {CHL AMB BH PROTECTIVE [phone]}  Patient's and/or Family's Goals in their own words: ***  Interventions: Interventions utilized:  {IBH Interventions:21014054:::0}   Patient and/or Family Response: ***  Standardized Assessments completed: {IBH Screening Tools:21014051:::0}  Patient Centered Plan: Patient is on the following Treatment Plan(s):  ***  Clinical Assessment/Diagnosis  No diagnosis found.      Assessment: Patient currently experiencing ***.   Patient may benefit from continued therapeutic intervention *** ***.  Coordination of Care: {CHL AMB BH COORDINATION OF CARE:[phone]}  DSM-5 Diagnosis: ***  Recommendations for Services/Supports/Treatments: ***  Progress towards Goals: {CHL AMB BH PROGRESS TOWARDS HNJOD:7896499943}  Treatment Plan Summary: Behavioral Health Clinician will: {CHL AMB BH TREATMENT PLAN SUMMARY THERAPIST TPOO:7896499945}  Individual will: {CHL AMB BH TREATMENT PLAN SUMMARY INDIVDUAL TPOO:7896499946}  Referral(s): {IBH Referrals:21014055}  Warren BROCKS Nikolaus Pienta, LCSW     12/25/2023    5:39 PM 12/24/2023    1:34 PM 12/17/2023    1:36 PM 11/19/2023    3:30 PM 11/07/2023    5:08 PM  Depression screen PHQ 2/9  Decreased Interest 0 0 0 1 1  Down, Depressed, Hopeless 0 0 0 1 0  PHQ - 2 Score 0 0 0 2 1  Altered sleeping 1   1 1   Tired, decreased energy 1   2 1   Change in appetite 1   1 1   Feeling bad or failure about  yourself  0   0 0  Trouble concentrating 0   0 0  Moving slowly or fidgety/restless 0   0 0  Suicidal thoughts 0   0 0  PHQ-9 Score 3   6 4       12/25/2023    5:40 PM 11/19/2023    3:30 PM 11/07/2023    5:08 PM 09/05/2023    4:18 PM  GAD 7 : Generalized Anxiety Score  Nervous, Anxious, on Edge 0 0 1 0  Control/stop worrying 1 1 1 1   Worry too much - different things 1 2 1 1   Trouble relaxing 1 1 1 1   Restless 0 0 0 0  Easily annoyed or irritable 0 1 0 1  Afraid - awful might happen 0 0 0 0  Total GAD 7 Score 3 5 4  4

## 2024-04-14 NOTE — Progress Notes (Signed)
 [facility] Weekly Session  Patient Details  Name: Barbara Dunn MRN: [id number] Date of Birth: 05-09-1971 Age: 53 y.o. PCP: Delbert Clam, MD  Vitals:   04/14/24 1501  Weight: 189 lb 12.8 oz (86.1 kg)     [facility] Weekly seesion - 04/14/24 1500       [facility] PRE[facility] Location   [facility] PRE[facility] Location [name]      Weekly Session   Topic Discussed Calorie breakdown   USDA dietary guidelines for the Macronutrients Carbs, proteins and fats; trustworthy organizations that evaluate supplements; difference between simple vs. complex carbs   Minutes exercised this week 40 minutes    Classes attended to date 43          Coleby Yett B Seidy Labreck 04/14/2024, 3:01 PM

## 2024-04-15 ENCOUNTER — Ambulatory Visit

## 2024-04-21 NOTE — Progress Notes (Signed)
 [facility] Weekly Session  Patient Details  Name: Barbara Dunn MRN: [id number] Date of Birth: 05/12/1971 Age: 53 y.o. PCP: Delbert Clam, MD  Vitals:   04/21/24 1502  Weight: 192 lb 3.2 oz (87.2 kg)     [facility] Weekly seesion - 04/21/24 1500       [facility] PREP Location   [facility] PREP Location [name]      Weekly Session   Topic Discussed Finding support   Y membership talk w/Nick   Minutes exercised this week 40 minutes    Classes attended to date 54          Tallie Dodds B Hannah Crill 04/21/2024, 3:03 PM

## 2024-04-28 NOTE — Progress Notes (Signed)
 [facility] Weekly Session  Patient Details  Name: Barbara Dunn MRN: [id number] Date of Birth: [birth year] Age: 52 y.o. PCP: Delbert Clam, MD  Vitals:   04/28/24 1505  Weight: 191 lb 9.6 oz (86.9 kg)     [facility] Weekly seesion - 04/28/24 1500       [facility] PREP Location   [facility] PREP Location [name]      Weekly Session   Topic Discussed Hitting roadblocks   End of program document review; 100 calorie snack label comparison   Minutes exercised this week 40 minutes    Classes attended to date 30          Abrey Bradway B Zebbie Ace 04/28/2024, 3:06 PM

## 2024-05-05 NOTE — Progress Notes (Signed)
 [facility] Weekly Session  Patient Details  Name: Barbara Dunn MRN: [id number] Date of Birth: 09/19/1971 Age: 53 y.o. PCP: Delbert Clam, MD  Vitals:   05/05/24 1457  Weight: 192 lb 6.4 oz (87.3 kg)     [facility] Weekly seesion - 05/05/24 1400       [facility] PREP Location   [facility] PREP Location [name]      Weekly Session   Topic Discussed Other   Fit testing completed; how fit and strong survey completed; final assessment visit scheduled for Thursday   Minutes exercised this week 70 minutes    Classes attended to date 39          Barbara Dunn B [name] 05/05/2024, 2:58 PM

## 2024-05-07 NOTE — Progress Notes (Signed)
 [facility] PRE[facility] Evaluation  Patient Details  Name: Barbara Dunn MRN: [id number] Date of Birth: 1970/11/17 Age: 53 y.o. PCP: Delbert Clam, MD  Vitals:   05/07/24 1439  BP: 118/70  Pulse: 77  SpO2: 99%  Weight: 192 lb (87.1 kg)     [facility] Eval - 05/07/24 1400       [facility] PREP Location   [facility] PREP Location [name]      Referral    Program Start Date 02/11/24    Program End Date 05/07/24      Measurement   Waist Circumference 38 inches    Hip Circumference 46 inches    Body fat 39.4 percent      Timed Up and Go (TUGS)   Timed Up and Go Low risk <9 seconds      Mobility and Daily Activities   I find it easy to walk up or down two or more flights of stairs. 3    I have no trouble taking out the trash. 4    I do housework such as vacuuming and dusting on my own without difficulty. 4    I can easily lift a gallon of milk (8lbs). 4    I can easily walk a mile. 2    I have no trouble reaching into high cupboards or reaching down to pick up something from the floor. 3    I do not have trouble doing out-door work such as Loss adjuster, chartered, raking leaves, or gardening. 1      Mobility and Daily Activities   I feel younger than my age. 2    I feel independent. 3    I feel energetic. 2    I live an active life.  3    I feel strong. 2    I feel healthy. 2    I feel active as other people my age. 3      How fit and strong are you.   Fit and Strong Total Score 38         Past Medical History:  Diagnosis Date   Anemia    Arthritis    Asthma    Bronchitis    Chronic kidney disease    DVT (deep venous thrombosis) (HCC)    Hypertension    Kidney stone    Migraines    Obesity    Pulmonary embolism (HCC)    Past Surgical History:  Procedure Laterality Date   CARDIAC CATHETERIZATION N/A 08/02/2015   Procedure: Left Heart Cath and Coronary Angiography;  Surgeon: Debby DELENA Sor, MD;  [facility];  Service: Cardiovascular;  Laterality: N/A;    CERVIX LESION DESTRUCTION     TUBAL LIGATION     TUBAL LIGATION     Social History   Tobacco Use  Smoking Status Former   Current packs/day: 0.00   Types: Cigarettes   Quit date: 12/15/2003   Years since quitting: 20.4  Smokeless Tobacco Never  Tobacco Comments   quit 10 years ago  Wt loss: 1 pound Cardio march increased form 236 to 250 Sit to stands increased from 12 to 13 How fit and strong increased from 34 to 38  Education sessions completed:12 Workout sessions completed: 12  Prisha Hiley B Jamyrah Saur 05/07/2024, 2:40 PM

## 2024-05-12 ENCOUNTER — Ambulatory Visit: Payer: Self-pay | Admitting: Physician Assistant

## 2024-05-12 ENCOUNTER — Inpatient Hospital Stay

## 2024-05-12 ENCOUNTER — Other Ambulatory Visit: Payer: Self-pay | Admitting: Physician Assistant

## 2024-05-12 ENCOUNTER — Ambulatory Visit (HOSPITAL_COMMUNITY)
Admission: RE | Admit: 2024-05-12 | Discharge: 2024-05-12 | Disposition: A | Discharge status: Home / Self Care | Source: Ambulatory Visit | Attending: Physician Assistant | Admitting: Physician Assistant

## 2024-05-12 DIAGNOSIS — Z86718 Personal history of other venous thrombosis and embolism: Secondary | ICD-10-CM | POA: Insufficient documentation

## 2024-05-12 DIAGNOSIS — D500 Iron deficiency anemia secondary to blood loss (chronic): Secondary | ICD-10-CM

## 2024-05-12 LAB — CMP (CANCER CENTER ONLY)
ALT: 13 U/L (ref 0–44)
AST: 20 U/L (ref 15–41)
Albumin: 4 g/dL (ref 3.5–5.0)
Alkaline Phosphatase: 49 U/L (ref 38–126)
Anion gap: 4 — ABNORMAL LOW (ref 5–15)
BUN: 8 mg/dL (ref 6–20)
CO2: 26 mmol/L (ref 22–32)
Calcium: 8.7 mg/dL — ABNORMAL LOW (ref 8.9–10.3)
Chloride: 110 mmol/L (ref 98–111)
Creatinine: 0.88 mg/dL (ref 0.44–1.00)
GFR, Estimated: >60 mL/min (ref >60)
Glucose, Bld: 89 mg/dL (ref 70–99)
Potassium: 3.8 mmol/L (ref 3.5–5.1)
Sodium: 140 mmol/L (ref 135–145)
Total Bilirubin: 0.4 mg/dL (ref 0.0–1.2)
Total Protein: 6.8 g/dL (ref 6.5–8.1)

## 2024-05-12 LAB — CBC WITH DIFFERENTIAL (CANCER CENTER ONLY)
Abs Immature Granulocytes: 0.01 K/uL (ref 0.00–0.07)
Basophils Absolute: 0.1 K/uL (ref 0.0–0.1)
Basophils Relative: 1 %
Eosinophils Absolute: 0.1 K/uL (ref 0.0–0.5)
Eosinophils Relative: 2 %
HCT: 34.7 % — ABNORMAL LOW (ref 36.0–46.0)
Hemoglobin: 11.2 g/dL — ABNORMAL LOW (ref 12.0–15.0)
Immature Granulocytes: 0 %
Lymphocytes Relative: 39 %
Lymphs Abs: 2.2 K/uL (ref 0.7–4.0)
MCH: 30.1 pg (ref 26.0–34.0)
MCHC: 32.3 g/dL (ref 30.0–36.0)
MCV: 93.3 fL (ref 80.0–100.0)
Monocytes Absolute: 0.5 K/uL (ref 0.1–1.0)
Monocytes Relative: 9 %
Neutro Abs: 2.8 K/uL (ref 1.7–7.7)
Neutrophils Relative %: 49 %
Platelet Count: 241 K/uL (ref 150–400)
RBC: 3.72 MIL/uL — ABNORMAL LOW (ref 3.87–5.11)
RDW: 13.4 % (ref 11.5–15.5)
WBC Count: 5.7 K/uL (ref 4.0–10.5)
nRBC: 0 % (ref 0.0–0.2)

## 2024-05-12 LAB — IRON AND IRON BINDING CAPACITY (CC-WL,HP ONLY)
Iron: 50 ug/dL (ref 28–170)
Saturation Ratios: 18 % (ref 10.4–31.8)
TIBC: 276 ug/dL (ref 250–450)
UIBC: 226 ug/dL (ref 148–442)

## 2024-05-12 LAB — FERRITIN: Ferritin: 53 ng/mL (ref 11–307)

## 2024-05-13 ENCOUNTER — Ambulatory Visit: Payer: Self-pay

## 2024-05-13 NOTE — Telephone Encounter (Signed)
-----   Message from [place] [employer] [employer] sent at 05/12/2024 10:47 PM EDT ----- Please notify patient there is no evidence of DVT ----- Message ----- From: Interface, [employer] Sent: 05/12/2024   1:26 PM EDT To: [place] [employer], PA-C

## 2024-05-13 NOTE — Telephone Encounter (Signed)
 Pt advised and voiced understanding.

## 2024-05-15 ENCOUNTER — Telehealth: Payer: Self-pay | Admitting: Family Medicine

## 2024-05-15 NOTE — Telephone Encounter (Signed)
 Pt confirmed appt 7/25

## 2024-05-18 ENCOUNTER — Ambulatory Visit: Payer: 59 | Attending: Family Medicine | Admitting: Family Medicine

## 2024-05-18 ENCOUNTER — Other Ambulatory Visit (HOSPITAL_COMMUNITY): Payer: Self-pay

## 2024-05-18 ENCOUNTER — Encounter: Payer: Self-pay | Admitting: Family Medicine

## 2024-05-18 ENCOUNTER — Other Ambulatory Visit: Payer: Self-pay | Admitting: Family Medicine

## 2024-05-18 ENCOUNTER — Other Ambulatory Visit: Payer: Self-pay

## 2024-05-18 ENCOUNTER — Other Ambulatory Visit (HOSPITAL_COMMUNITY)
Admission: RE | Admit: 2024-05-18 | Discharge: 2024-05-18 | Disposition: A | Discharge status: Home / Self Care | Source: Ambulatory Visit | Attending: Family Medicine | Admitting: Family Medicine

## 2024-05-18 VITALS — BP 133/86 | HR 61 | Wt 196.4 lb

## 2024-05-18 DIAGNOSIS — I10 Essential (primary) hypertension: Secondary | ICD-10-CM

## 2024-05-18 DIAGNOSIS — N898 Other specified noninflammatory disorders of vagina: Secondary | ICD-10-CM | POA: Diagnosis not present

## 2024-05-18 DIAGNOSIS — M791 Myalgia, unspecified site: Secondary | ICD-10-CM | POA: Diagnosis not present

## 2024-05-18 DIAGNOSIS — I824Y2 Acute embolism and thrombosis of unspecified deep veins of left proximal lower extremity: Secondary | ICD-10-CM | POA: Diagnosis not present

## 2024-05-18 DIAGNOSIS — I749 Embolism and thrombosis of unspecified artery: Secondary | ICD-10-CM

## 2024-05-18 DIAGNOSIS — G43009 Migraine without aura, not intractable, without status migrainosus: Secondary | ICD-10-CM

## 2024-05-18 DIAGNOSIS — G629 Polyneuropathy, unspecified: Secondary | ICD-10-CM

## 2024-05-18 DIAGNOSIS — M79604 Pain in right leg: Secondary | ICD-10-CM

## 2024-05-18 DIAGNOSIS — M79605 Pain in left leg: Secondary | ICD-10-CM | POA: Diagnosis not present

## 2024-05-18 DIAGNOSIS — E669 Obesity, unspecified: Secondary | ICD-10-CM

## 2024-05-18 LAB — POCT GLYCOSYLATED HEMOGLOBIN (HGB A1C): HbA1c, POC (controlled diabetic range): 5 % (ref 0.0–7.0)

## 2024-05-18 MED ORDER — AMLODIPINE BESYLATE 2.5 MG PO TABS
2.5000 mg | ORAL_TABLET | Freq: Every day | ORAL | 1 refills | Status: DC
  Filled 2024-05-18: qty 30, 30d supply, fill #0
  Filled 2024-06-21: qty 30, 30d supply, fill #1
  Filled 2024-07-26: qty 30, 30d supply, fill #2
  Filled 2024-08-23: qty 30, 30d supply, fill #3
  Filled 2024-09-27: qty 30, 30d supply, fill #4
  Filled 2024-10-25: qty 30, 30d supply, fill #5

## 2024-05-18 MED ORDER — TOPIRAMATE 50 MG PO TABS
50.0000 mg | ORAL_TABLET | Freq: Two times a day (BID) | ORAL | 1 refills | Status: AC
  Filled 2024-05-18: qty 60, 30d supply, fill #0
  Filled 2024-07-26: qty 60, 30d supply, fill #1
  Filled 2024-10-11: qty 60, 30d supply, fill #2

## 2024-05-18 MED ORDER — APIXABAN 5 MG PO TABS
5.0000 mg | ORAL_TABLET | Freq: Two times a day (BID) | ORAL | 0 refills | Status: DC
  Filled 2024-05-18: qty 60, 30d supply, fill #0

## 2024-05-18 MED ORDER — FLUCONAZOLE 150 MG PO TABS
150.0000 mg | ORAL_TABLET | Freq: Once | ORAL | 0 refills | Status: AC
Start: 2024-05-18 — End: 2024-05-19
  Filled 2024-05-18: qty 1, 1d supply, fill #0

## 2024-05-18 MED ORDER — LISINOPRIL 20 MG PO TABS
20.0000 mg | ORAL_TABLET | Freq: Every day | ORAL | 1 refills | Status: DC
  Filled 2024-05-18: qty 30, 30d supply, fill #0
  Filled 2024-06-21: qty 30, 30d supply, fill #1
  Filled 2024-07-26: qty 30, 30d supply, fill #2
  Filled 2024-08-23: qty 30, 30d supply, fill #3
  Filled 2024-09-27: qty 30, 30d supply, fill #4
  Filled 2024-10-25: qty 30, 30d supply, fill #5

## 2024-05-18 NOTE — Progress Notes (Signed)
 Subjective:  Patient ID: Barbara Dunn, female    DOB: Nov 24, 1970  Age: 53 y.o. MRN: [id number]  CC: Medical Management of Chronic Issues     Discussed the use of AI scribe software for clinical note transcription with the patient, who gave verbal consent to proceed.  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  She experiences severe shooting pains in both legs, left >right  sometimes radiating upwards, which require her to sit down. The pain is persistent and affects her ability to exercise. She was recently checked for DVT and this was -6 days ago. She takes Eliquis  5 mg twice daily for chronic anticoagulation.  She is concerned about a possible genetic muscle disease, as her mother had a muscle condition that led to her inability to walk. She is unsure of the exact diagnosis her mother had.  She reports symptoms suggestive of a yeast infection, including itching and a slight discharge, which she associates with using a different soap. Her current medications include hydroxyzine , Crestor , Topamax , and Meclizine  as needed. She has not been taking any specific medication for the leg pain.  She denies smoking and has not smoked in over ten years. She is concerned about her weight and has inquired about weight loss options, noting that her leg pain impacts her ability to exercise.    Past Medical History:  Diagnosis Date   Anemia    Arthritis    Asthma    Bronchitis    Chronic kidney disease    DVT (deep venous thrombosis) (HCC)    Hypertension    Kidney stone    Migraines    Obesity    Pulmonary embolism [facility])     Past Surgical  History:  Procedure Laterality Date   CARDIAC CATHETERIZATION N/A 08/02/2015   Procedure: Left Heart Cath and Coronary Angiography;  Surgeon: Debby DELENA Sor, MD;  Location: [facility];  Service: Cardiovascular;  Laterality: N/A;   CERVIX LESION DESTRUCTION     TUBAL LIGATION     TUBAL LIGATION      Family History  Problem Relation Age of Onset   Hypertension Mother    Schizophrenia Mother    Schizophrenia Sister    Schizophrenia Brother    Schizophrenia Brother    Schizophrenia Son    Colon cancer Neg Hx    Esophageal cancer Neg Hx    Colon polyps Neg Hx    Rectal cancer Neg Hx    Stomach cancer Neg Hx     Social History   Socioeconomic History   Marital status: Single    Spouse name: Not on file   Number of children: Not on file   Years of education: Not on file   Highest education level: Not on file  Occupational History   Not on file  Tobacco Use   Smoking status: Former    Current packs/day: 0.00    Types: Cigarettes    Quit date: 12/15/2003    Years since quitting: 20.4   Smokeless tobacco: Never   Tobacco comments:    quit 10 years ago  Vaping Use   Vaping status: Never Used  Substance and Sexual Activity   Alcohol use: Yes  Alcohol/week: 2.0 standard drinks of alcohol    Types: 1 Glasses of wine, 1 Shots of liquor per week    Comment: Once every 3 months    Drug use: No   Sexual activity: Yes    Birth control/protection: Other-see comments    Comment: tubal  Other Topics Concern   Not on file  Social History Narrative   Pt has GED. CNA as well.    Social Drivers of Health   Financial Resource Strain: Medium Risk (11/19/2023)   Overall Financial Resource Strain (CARDIA)    Difficulty of Paying Living Expenses: Somewhat hard  Food Insecurity: Food Insecurity Present (11/19/2023)   Hunger Vital Sign    Worried About Running Out of Food in the Last Year: Sometimes true    Ran Out of Food in the Last Year: Sometimes true  Transportation  Needs: No Transportation Needs (11/19/2023)   PRAPARE - Administrator, Civil Service (Medical): No    Lack of Transportation (Non-Medical): No  Physical Activity: Inactive (11/19/2023)   Exercise Vital Sign    Days of Exercise per Week: 0 days    Minutes of Exercise per Session: 0 min  Stress: Stress Concern Present (11/19/2023)   [employer] of Occupational Health - Occupational Stress Questionnaire    Feeling of Stress : To some extent  Social Connections: Moderately Isolated (11/19/2023)   Social Connection and Isolation Panel    Frequency of Communication with Friends and Family: Three times a week    Frequency of Social Gatherings with Friends and Family: Once a week    Attends Religious Services: Never    Database administrator or Organizations: No    Attends Engineer, structural: 1 to 4 times per year    Marital Status: Never married    No Known Allergies  Outpatient Medications Prior to Visit  Medication Sig Dispense Refill   albuterol  (VENTOLIN  HFA) 108 (90 Base) MCG/ACT inhaler Inhale 1 puff into the lungs every 6 (six) hours as needed for wheezing and shortness of breath 6.7 g 1   amLODipine  (NORVASC ) 2.5 MG tablet Take 1 tablet (2.5 mg total) by mouth daily. 90 tablet 1   apixaban  (ELIQUIS ) 5 MG TABS tablet Take 1 tablet (5 mg total) by mouth 2 (two) times daily. Hematology needs to send future refills. 60 tablet 0   APPLE CIDER VINEGAR PO Take 1 capsule by mouth daily.     BIOTIN PO Take 2 tablets by mouth daily.      cetirizine (ZYRTEC) 10 MG tablet Take 10 mg by mouth daily.     [employer] FIBER GUMMY BEARS CHILDREN PO Take by mouth. Also contains B12, vitamin C, and vitamin D      docusate sodium (COLACE) 100 MG capsule Take 100 mg by mouth as needed.      ELDERBERRY PO Take by mouth.     fluticasone  (FLONASE ) 50 MCG/ACT nasal spray PLACE 2 SPRAYS INTO BOTH NOSTRILS DAILY. 16 g 2   fluticasone  (FLOVENT  HFA) 110 MCG/ACT inhaler INHALE 1 PUFF INTO THE  LUNGS 2 (TWO) TIMES DAILY. 12 g 2   hydrOXYzine  (ATARAX ) 25 MG tablet Take 1 tablet (25 mg total) by mouth at bedtime as needed. 90 tablet 1   Iron , Ferrous Sulfate , 325 (65 Fe) MG TABS Take 325 mg by mouth 2 (two) times daily. 60 tablet 3   lisinopril  (ZESTRIL ) 20 MG tablet Take 1 tablet (20 mg total) by mouth daily. 90 tablet 1  Magnesium 250 MG TABS Take 250 mg by mouth daily.     meclizine  (ANTIVERT ) 25 MG tablet Take 1 tablet (25 mg total) by mouth 3 (three) times daily as needed for dizziness. 60 tablet 1   Multiple Vitamins-Minerals (MULTIVITAMIN ADULT PO) Take 1 tablet by mouth daily.      oxymetazoline  (AFRIN NASAL SPRAY) 0.05 % nasal spray Place 1 spray into both nostrils 2 (two) times daily. 30 mL 0   rosuvastatin  (CRESTOR ) 10 MG tablet Take 1 tablet (10 mg total) by mouth daily. 90 tablet 3   triamcinolone  cream (KENALOG ) 0.1 % APPLY 1 APPLICATION TOPICALLY 2 (TWO) TIMES DAILY. 30 g 2   vitamin C (ASCORBIC ACID) 250 MG tablet Take 250 mg by mouth daily.     topiramate  (TOPAMAX ) 50 MG tablet Take 1 tablet (50 mg total) by mouth 2 (two) times daily. 180 tablet 1   SUMAtriptan  (IMITREX ) 100 MG tablet Take 1 tablet (100 mg total) by mouth once for 1 dose at the onset of a migraine. May repeat 1 tablet in 2 hours if migraine persists.  Maximum daily dose 200 mg 10 tablet 6   No facility-administered medications prior to visit.     ROS Review of Systems  Constitutional:  Negative for activity change and appetite change.  HENT:  Negative for sinus pressure and sore throat.   Respiratory:  Negative for chest tightness, shortness of breath and wheezing.   Cardiovascular:  Negative for chest pain and palpitations.  Gastrointestinal:  Negative for abdominal distention, abdominal pain and constipation.  Genitourinary: Negative.   Musculoskeletal:        See HPI  Psychiatric/Behavioral:  Negative for behavioral problems and dysphoric mood.     Objective:  BP 133/86 (BP Location: Right  Arm, Patient Position: Sitting, Cuff Size: Large)   Pulse 61   Wt 196 lb 6.4 oz (89.1 kg)   SpO2 100%   BMI 35.35 kg/m      05/18/2024    4:06 PM 05/07/2024    2:39 PM 05/05/2024    2:57 PM  BP/Weight  Systolic BP 133 118   Diastolic BP 86 70   Wt. (Lbs) 196.4 192 192.4  BMI 35.35 kg/m2 34.56 kg/m2 34.63 kg/m2      Physical Exam Constitutional:      Appearance: She is well-developed.  Cardiovascular:     Rate and Rhythm: Normal rate.     Heart sounds: Normal heart sounds. No murmur heard. Pulmonary:     Effort: Pulmonary effort is normal.     Breath sounds: Normal breath sounds. No wheezing or rales.  Chest:     Chest wall: No tenderness.  Abdominal:     General: Bowel sounds are normal. There is no distension.     Palpations: Abdomen is soft. There is no mass.     Tenderness: There is no abdominal tenderness.  Musculoskeletal:        General: Normal range of motion.     Right lower leg: No edema.     Left lower leg: No edema.  Neurological:     Mental Status: She is alert and oriented to person, place, and time.  Psychiatric:        Mood and Affect: Mood normal.        Latest Ref Rng & Units 05/12/2024   11:29 AM 04/07/2024    3:21 PM 02/04/2024   12:57 PM  CMP  Glucose 70 - 99 mg/dL 89   78  BUN 6 - 20 mg/dL 8   12   Creatinine 9.55 - 1.00 mg/dL 9.11   8.87   Sodium 864 - 145 mmol/L 140   141   Potassium 3.5 - 5.1 mmol/L 3.8   3.7   Chloride 98 - 111 mmol/L 110   112   CO2 22 - 32 mmol/L 26   24   Calcium  8.9 - 10.3 mg/dL 8.7   9.2   Total Protein 6.5 - 8.1 g/dL 6.8  6.5  7.4   Total Bilirubin 0.0 - 1.2 mg/dL 0.4  0.5  0.6   Alkaline Phos 38 - 126 U/L 49  61  51   AST 15 - 41 U/L 20  21  17    ALT 0 - 44 U/L 13  14  15      Lipid Panel     Component Value Date/Time   CHOL 149 04/07/2024 1521   TRIG 70 04/07/2024 1521   HDL 71 04/07/2024 1521   CHOLHDL 2.1 04/07/2024 1521   CHOLHDL 1.8 07/27/2016 0917   VLDL 14 07/27/2016 0917   LDLCALC 64  04/07/2024 1521    CBC    Component Value Date/Time   WBC 5.7 05/12/2024 1129   WBC 6.5 12/29/2023 1357   RBC 3.72 (L) 05/12/2024 1129   HGB 11.2 (L) 05/12/2024 1129   HGB 11.6 02/15/2023 0944   HCT 34.7 (L) 05/12/2024 1129   HCT 36.9 02/15/2023 0944   PLT 241 05/12/2024 1129   PLT 265 02/15/2023 0944   MCV 93.3 05/12/2024 1129   MCV 90 02/15/2023 0944   MCH 30.1 05/12/2024 1129   MCHC 32.3 05/12/2024 1129   RDW 13.4 05/12/2024 1129   RDW 12.1 02/15/2023 0944   LYMPHSABS 2.2 05/12/2024 1129   LYMPHSABS 2.1 11/20/2022 0917   MONOABS 0.5 05/12/2024 1129   EOSABS 0.1 05/12/2024 1129   EOSABS 0.1 11/20/2022 0917   BASOSABS 0.1 05/12/2024 1129   BASOSABS 0.1 11/20/2022 9082    Lab Results  Component Value Date   HGBA1C 5.0 05/18/2024       Assessment & Plan Pain in lower extremities/myalgia Intermittent shooting pain possibly myalgia versus neuropathic, related to past venous thromboembolism or rosuvastatin  side effects. Diabetes ruled out. Family history of muscle disease unclear. - Hold rosuvastatin  for one week to assess if pain resolves. - Refer to weight management clinic for weight loss support. - Consider muscle relaxant if pain becomes intolerable.  Vaginal itching Symptoms suggest yeast infection, possibly triggered by soap use. Need to rule out bacterial vaginosis. - Prescribe Diflucan . - Perform vaginal swab to rule out bacterial vaginosis or other infections.   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 No smoking for over ten years. Insurance may not cover weight loss medications without diabetes diagnosis. - Encourage continued  exercise and weight management efforts. - Refer to weight management clinic for further support.    Meds ordered this encounter  Medications   fluconazole  (DIFLUCAN ) 150 MG tablet    Sig: Take 1 tablet (150 mg total) by mouth once for 1 dose.    Dispense:  1 tablet    Refill:  0   topiramate  (TOPAMAX ) 50 MG tablet    Sig: Take  1 tablet (50 mg total) by mouth 2 (two) times daily.    Dispense:  180 tablet    Refill:  1    Follow-up: Return in about 6 months (around 11/18/2024).       Corrina Sabin, MD, FAAFP. [facility] [place], [place] [phone]   05/18/2024, 6:11 PM

## 2024-05-18 NOTE — Patient Instructions (Signed)
 VISIT SUMMARY:  During your visit, we discussed your leg pain, concerns about a possible muscle disease, and symptoms suggestive of a yeast infection. We also reviewed your general health maintenance, including your interest in weight management and your history of not smoking for over ten years.  YOUR PLAN:  -LEG PAIN: Your leg pain may be related to past blood clots or a side effect of your medication, rosuvastatin . We will stop the rosuvastatin  for one week to see if the pain improves. You will also be referred to a weight management clinic for support with weight loss, which may help alleviate some of the pain. If the pain becomes too severe, we may consider prescribing a muscle relaxant.  -YEAST INFECTION: Your symptoms suggest a yeast infection, which may have been triggered by using a different soap. We will prescribe Diflucan  to treat the infection and perform a vaginal swab to rule out any other infections, such as bacterial vaginosis.  -GENERAL HEALTH MAINTENANCE: You are encouraged to continue your efforts with exercise and weight management. Since you have not smoked for over ten years, this is beneficial for your overall health. You will be referred to a weight management clinic for additional support.  INSTRUCTIONS:  Please stop taking rosuvastatin  for one week and monitor if your leg pain improves. Take the prescribed Diflucan  as directed. Attend the weight management clinic as referred for support with weight loss. Follow up with us  if your symptoms persist or worsen, or if you have any new concerns.

## 2024-05-19 ENCOUNTER — Encounter (INDEPENDENT_AMBULATORY_CARE_PROVIDER_SITE_OTHER): Payer: Self-pay

## 2024-05-20 LAB — CERVICOVAGINAL ANCILLARY ONLY
Bacterial Vaginitis (gardnerella): NEGATIVE
Candida Glabrata: NEGATIVE
Candida Vaginitis: NEGATIVE
Chlamydia: NEGATIVE
Comment: NEGATIVE
Comment: NEGATIVE
Comment: NEGATIVE
Comment: NEGATIVE
Comment: NEGATIVE
Comment: NORMAL
Neisseria Gonorrhea: NEGATIVE
Trichomonas: NEGATIVE

## 2024-05-20 LAB — CK: Total CK: 155 U/L (ref 32–182)

## 2024-05-20 LAB — ALDOLASE: Aldolase: 3.8 U/L (ref 3.3–10.3)

## 2024-05-21 ENCOUNTER — Ambulatory Visit: Payer: Self-pay | Admitting: Family Medicine

## 2024-05-26 ENCOUNTER — Ambulatory Visit (INDEPENDENT_AMBULATORY_CARE_PROVIDER_SITE_OTHER): Admitting: Nurse Practitioner

## 2024-05-26 ENCOUNTER — Encounter (INDEPENDENT_AMBULATORY_CARE_PROVIDER_SITE_OTHER): Payer: Self-pay | Admitting: Nurse Practitioner

## 2024-05-26 VITALS — BP 126/80 | HR 62 | Temp 99.0°F | Ht 61.0 in | Wt 189.0 lb

## 2024-05-26 DIAGNOSIS — D472 Monoclonal gammopathy: Secondary | ICD-10-CM | POA: Diagnosis not present

## 2024-05-26 DIAGNOSIS — E669 Obesity, unspecified: Secondary | ICD-10-CM | POA: Diagnosis not present

## 2024-05-26 DIAGNOSIS — I10 Essential (primary) hypertension: Secondary | ICD-10-CM

## 2024-05-26 DIAGNOSIS — Z6835 Body mass index (BMI) 35.0-35.9, adult: Secondary | ICD-10-CM

## 2024-05-26 DIAGNOSIS — Z86711 Personal history of pulmonary embolism: Secondary | ICD-10-CM | POA: Diagnosis not present

## 2024-05-26 DIAGNOSIS — I471 Supraventricular tachycardia, unspecified: Secondary | ICD-10-CM

## 2024-05-26 DIAGNOSIS — E66812 Obesity, class 2: Secondary | ICD-10-CM

## 2024-05-26 DIAGNOSIS — Z7901 Long term (current) use of anticoagulants: Secondary | ICD-10-CM

## 2024-05-26 DIAGNOSIS — E7841 Elevated Lipoprotein(a): Secondary | ICD-10-CM

## 2024-05-26 DIAGNOSIS — Z86718 Personal history of other venous thrombosis and embolism: Secondary | ICD-10-CM

## 2024-05-26 NOTE — Progress Notes (Signed)
 [address] [place], [place], [place] [address] Office: [phone]  /  Fax: [phone]   Initial Consultation    Shanece Cochrane was seen in clinic today to evaluate for obesity. She is interested in losing weight to improve overall health and reduce the risk of weight related complications. She presents today to review program treatment options, initial physical assessment, and evaluation.    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 States she is responsible for being transport for several family members and does work a lot as a Lawyer so need for [employer] is frequent.   Highest weight was 215.  She has done multiple weight loss attempts since her early 33's and will lose weight but then regain. She does have difficulty sleeping, will usually sleep only 3 hours a night.  She does crave sweets and snacks frequently.  She does occasionally skip lunch but will snack throughout the day. She recently completed the PREP program at the [facility] which instructed patient on cardio and strength training activity.  Does not believe she lost weight while doing the program but did feel like she may have lost some inches.   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  She follows at hematology to try to determine possible coagulation disorders but no diagnosis as of yet.  She is on Eliquis  5 mg BID. 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   Blood pressure is currently well controlled with lisinopril  20 mg every day and amlodipine  2.5 mg every day.  BP Readings from Last 3 Encounters:  05/26/24 126/80  05/18/24 133/86  05/07/24 118/70     Anthropometrics and Bioimpedance Analysis   Body mass index is 35.71 kg/m. Body Fat Mass : 43 % Visceral Fat Mass Rating : 11   Obesity Related Diseases and Complications  Obesity Quality of Life and Psychosocial Complications: Reduced health-related quality of life and Decrease physical activity and social participation  Cardiometabolic: Hypertension,  Coronary artery disease or calcifications on vascular imaging, Venous thromboembolism, DOE, and Fatigue  Biomechanical: Osteoarthritis of the knee or hip and Asthma   Weight Related History  She was referred by: PCP  When asked what they would like to accomplish? She states: Adopt a healthier eating pattern and lifestyle, Improve energy levels and physical activity, Improve existing medical conditions, and Improve quality of life  Weight history: She stopped smoking 10 years ago and has noticed she started gaining weight at that time.  She states she replaced eating for smoking and does eat simple carbs frequently  Highest weight: 215 pounds  Contributing factors: family history of obesity, disruption of circadian rhythm / sleep disordered breathing, consumption of processed foods, use of obesogenic medications: Anticholinergics, reduced physical activity, chronic skipping of meals, need for convenience due to lack of time, and need for convenient foods  Prior weight loss attempts: She participated in the PREP program at the [facility] improved BP  Current or previous pharmacotherapy: None  Response to medication: Never tried medications  Current nutrition plan: None  Greatest challenge with dieting: difficulty maintaining reduced calorie state. Eats at night and craves sweets  Current level of physical activity: None, NEAT, and Other: Finished PREP program at the [facility] 2 weeks ago that taught her cardio and strength training 2 times a week  Barriers to Exercise: time and energy  Readiness and Motivation  On a scale from 0 to 10 How ready are you to make changes to your eating and physical activity  to lose weight? 10 How important is it for you to lose weight right now ? 10 How confident are you that you can lose weight if you try? 10  Past Medical History   Past Medical History:  Diagnosis Date   Anemia    Arthritis    Asthma    Bronchitis    Chronic kidney disease    DVT  (deep venous thrombosis) (HCC)    Hypertension    Kidney stone    Migraines    Obesity    Pulmonary embolism (HCC)      Objective    BP 126/80   Pulse 62   Temp 99 F (37.2 C)   Ht 5' 1 (1.549 m)   Wt 189 lb (85.7 kg)   SpO2 100%   BMI 35.71 kg/m  She was weighed on the bioimpedance scale: Body mass index is 35.71 kg/m.    General:  Alert, oriented and cooperative. Patient is in no acute distress.  Respiratory: Normal respiratory effort, no problems with respiration noted   Gait: able to ambulate independently  Mental Status: Normal mood and affect. Normal behavior. Normal judgment and thought content.   Diagnostic Data Reviewed  BMET    Component Value Date/Time   NA 140 05/12/2024 1129   NA 140 11/20/2022 0917   K 3.8 05/12/2024 1129   CL 110 05/12/2024 1129   CO2 26 05/12/2024 1129   GLUCOSE 89 05/12/2024 1129   BUN 8 05/12/2024 1129   BUN 10 11/20/2022 0917   CREATININE 0.88 05/12/2024 1129   CREATININE 0.86 01/02/2017 1420   CALCIUM  8.7 (L) 05/12/2024 1129   GFRNONAA >60 05/12/2024 1129   GFRNONAA 82 01/02/2017 1420   GFRAA 109 11/08/2020 1443   GFRAA >89 01/02/2017 1420   Lab Results  Component Value Date   HGBA1C 5.0 05/18/2024   HGBA1C 5.4 09/11/2011   No results found for: INSULIN CBC    Component Value Date/Time   WBC 5.7 05/12/2024 1129   WBC 6.5 12/29/2023 1357   RBC 3.72 (L) 05/12/2024 1129   HGB 11.2 (L) 05/12/2024 1129   HGB 11.6 02/15/2023 0944   HCT 34.7 (L) 05/12/2024 1129   HCT 36.9 02/15/2023 0944   PLT 241 05/12/2024 1129   PLT 265 02/15/2023 0944   MCV 93.3 05/12/2024 1129   MCV 90 02/15/2023 0944   MCH 30.1 05/12/2024 1129   MCHC 32.3 05/12/2024 1129   RDW 13.4 05/12/2024 1129   RDW 12.1 02/15/2023 0944   Iron /TIBC/Ferritin/ %Sat    Component Value Date/Time   IRON  50 05/12/2024 1129   TIBC 276 05/12/2024 1129   FERRITIN 53 05/12/2024 1129   IRONPCTSAT 18 05/12/2024 1129   Lipid Panel     Component Value  Date/Time   CHOL 149 04/07/2024 1521   TRIG 70 04/07/2024 1521   HDL 71 04/07/2024 1521   CHOLHDL 2.1 04/07/2024 1521   CHOLHDL 1.8 07/27/2016 0917   VLDL 14 07/27/2016 0917   LDLCALC 64 04/07/2024 1521   Hepatic Function Panel     Component Value Date/Time   PROT 6.8 05/12/2024 1129   PROT 6.5 04/07/2024 1521   ALBUMIN 4.0 05/12/2024 1129   ALBUMIN 4.1 04/07/2024 1521   AST 20 05/12/2024 1129   ALT 13 05/12/2024 1129   ALKPHOS 49 05/12/2024 1129   BILITOT 0.4 05/12/2024 1129   BILIDIR 0.20 04/07/2024 1521      Component Value Date/Time   TSH 0.740 11/19/2023 1606  Medications  Outpatient Encounter Medications as of 05/26/2024  Medication Sig Note   albuterol  (VENTOLIN  HFA) 108 (90 Base) MCG/ACT inhaler Inhale 1 puff into the lungs every 6 (six) hours as needed for wheezing and shortness of breath    amLODipine  (NORVASC ) 2.5 MG tablet Take 1 tablet (2.5 mg total) by mouth daily.    apixaban  (ELIQUIS ) 5 MG TABS tablet Take 1 tablet (5 mg total) by mouth 2 (two) times daily. Hematology needs to send future refills.    APPLE CIDER VINEGAR PO Take 1 capsule by mouth daily.    BIOTIN PO Take 2 tablets by mouth daily.     cetirizine (ZYRTEC) 10 MG tablet Take 10 mg by mouth daily.    [employer] FIBER GUMMY BEARS CHILDREN PO Take by mouth. Also contains B12, vitamin C, and vitamin D     docusate sodium (COLACE) 100 MG capsule Take 100 mg by mouth as needed.     ELDERBERRY PO Take by mouth.    fluticasone  (FLONASE ) 50 MCG/ACT nasal spray PLACE 2 SPRAYS INTO BOTH NOSTRILS DAILY.    fluticasone  (FLOVENT  HFA) 110 MCG/ACT inhaler INHALE 1 PUFF INTO THE LUNGS 2 (TWO) TIMES DAILY.    hydrOXYzine  (ATARAX ) 25 MG tablet Take 1 tablet (25 mg total) by mouth at bedtime as needed.    Iron , Ferrous Sulfate , 325 (65 Fe) MG TABS Take 325 mg by mouth 2 (two) times daily. 11/20/2023: Taking one tablet daily   lisinopril  (ZESTRIL ) 20 MG tablet Take 1 tablet (20 mg total) by mouth daily.    Magnesium 250 MG  TABS Take 250 mg by mouth daily.    meclizine  (ANTIVERT ) 25 MG tablet Take 1 tablet (25 mg total) by mouth 3 (three) times daily as needed for dizziness.    Multiple Vitamins-Minerals (MULTIVITAMIN ADULT PO) Take 1 tablet by mouth daily.     oxymetazoline  (AFRIN NASAL SPRAY) 0.05 % nasal spray Place 1 spray into both nostrils 2 (two) times daily.    rosuvastatin  (CRESTOR ) 10 MG tablet Take 1 tablet (10 mg total) by mouth daily.    topiramate  (TOPAMAX ) 50 MG tablet Take 1 tablet (50 mg total) by mouth 2 (two) times daily.    triamcinolone  cream (KENALOG ) 0.1 % APPLY 1 APPLICATION TOPICALLY 2 (TWO) TIMES DAILY.    vitamin C (ASCORBIC ACID) 250 MG tablet Take 250 mg by mouth daily.    SUMAtriptan  (IMITREX ) 100 MG tablet Take 1 tablet (100 mg total) by mouth once for 1 dose at the onset of a migraine. May repeat 1 tablet in 2 hours if migraine persists.  Maximum daily dose 200 mg    [DISCONTINUED] diphenhydrAMINE  (BENADRYL ) 25 mg capsule Take 25 mg by mouth every 6 (six) hours as needed for itching. Reported on 01/23/2016    No facility-administered encounter medications on file as of 05/26/2024.     Assessment and Plan   Primary hypertension  Paroxysmal SVT (supraventricular tachycardia) (HCC)  MGUS (monoclonal gammopathy of unknown significance)  Chronic anticoagulation  Aortic atherosclerosis (HCC) on 12/29/23 abdominal CT  History of pulmonary embolism       Obesity Treatment and Action Plan:  Patient will work on garnering support from family and friends to begin weight loss journey. Will work on eliminating or reducing the presence of highly palatable, calorie dense foods in the home. Will complete provided nutritional and psychosocial assessment questionnaire before the next appointment. Will be scheduled for indirect calorimetry to determine resting energy expenditure in a fasting state.  This will  allow us  to create a reduced calorie, high-protein meal plan to promote loss of fat  mass while preserving muscle mass. Counseled on the health benefits of losing 5%-15% of total body weight. Was counseled on nutritional approaches to weight loss and benefits of reducing processed foods and consuming plant-based foods and high quality protein as part of nutritional weight management. Was counseled on pharmacotherapy and role as an adjunct in weight management.   Education and Additional resources  She was weighed on the bioimpedance scale and results were discussed and documented in the synopsis.  We discussed obesity as a progressive, chronic disease and the importance of a more detailed evaluation of all the factors contributing to the disease.  We reviewed the basic principles in obesity management.   We discussed the importance of long term lifestyle changes which include nutrition, exercise and behavioral modification as well as the importance of customizing this to her specific health and social needs.  We reviewed the role of medical interventions including pharmacotherapy and surgical interventions.   We discussed the benefits of reaching a healthier weight to alleviate the symptoms of existing conditions and reduce the risks of the biomechanical, cardiometabolic and psychological effects of obesity.  We reviewed our program approach and philosophy, which are guided by the four pillars of obesity medicine.  We discussed how to prepare for intake appointment and the importance of fasting and avoidance of stimulants for at least 8 hours prior to indirect calorimetry.  Zian Brailsford appears to be in the action stage of change and reports being ready to initiate intensive lifestyle and behavioral modifications as part of their weight loss journey.  Attestation  Reviewed by clinician on day of visit: allergies, medications, problem list, medical history, surgical history, family history, social history, and previous encounter notes pertinent to obesity diagnosis.  I  have spent 51 minutes in the care of the patient today including: 17 minutes before the visit reviewing and preparing the chart. 21 minutes face-to-face assessing and reviewing listed medical problems as outlined in obesity care plan, providing nutritional and behavioral counseling on topics outlined in the obesity care plan, independently interpreting test results and goals of care, as described in assessment and plan, reviewing and discussing biometric information and progress, and reviewing latest PCP notes and specialist consultations 13 minutes after the visit updating chart and documentation of encounter.  Kohen Reither ANP-C

## 2024-06-14 ENCOUNTER — Other Ambulatory Visit: Payer: Self-pay | Admitting: Physician Assistant

## 2024-06-15 ENCOUNTER — Other Ambulatory Visit (HOSPITAL_COMMUNITY): Payer: Self-pay

## 2024-06-15 MED ORDER — APIXABAN 5 MG PO TABS
5.0000 mg | ORAL_TABLET | Freq: Two times a day (BID) | ORAL | 0 refills | Status: DC
  Filled 2024-06-15: qty 60, 30d supply, fill #0

## 2024-06-17 ENCOUNTER — Other Ambulatory Visit (HOSPITAL_COMMUNITY): Payer: Self-pay

## 2024-06-23 ENCOUNTER — Other Ambulatory Visit (HOSPITAL_COMMUNITY): Payer: Self-pay

## 2024-07-06 ENCOUNTER — Other Ambulatory Visit: Payer: Self-pay

## 2024-07-06 ENCOUNTER — Ambulatory Visit: Payer: Self-pay

## 2024-07-06 ENCOUNTER — Emergency Department (HOSPITAL_COMMUNITY)
Admission: EM | Admit: 2024-07-06 | Discharge: 2024-07-07 | Disposition: A | Discharge status: Home / Self Care | Attending: Emergency Medicine | Admitting: Emergency Medicine

## 2024-07-06 ENCOUNTER — Encounter (HOSPITAL_COMMUNITY): Payer: Self-pay

## 2024-07-06 DIAGNOSIS — N939 Abnormal uterine and vaginal bleeding, unspecified: Secondary | ICD-10-CM | POA: Insufficient documentation

## 2024-07-06 DIAGNOSIS — R42 Dizziness and giddiness: Secondary | ICD-10-CM | POA: Insufficient documentation

## 2024-07-06 DIAGNOSIS — Z7901 Long term (current) use of anticoagulants: Secondary | ICD-10-CM | POA: Diagnosis not present

## 2024-07-06 DIAGNOSIS — J45909 Unspecified asthma, uncomplicated: Secondary | ICD-10-CM | POA: Diagnosis not present

## 2024-07-06 DIAGNOSIS — N189 Chronic kidney disease, unspecified: Secondary | ICD-10-CM | POA: Diagnosis not present

## 2024-07-06 DIAGNOSIS — R1030 Lower abdominal pain, unspecified: Secondary | ICD-10-CM | POA: Diagnosis not present

## 2024-07-06 DIAGNOSIS — Z79899 Other long term (current) drug therapy: Secondary | ICD-10-CM | POA: Diagnosis not present

## 2024-07-06 LAB — CBC WITH DIFFERENTIAL/PLATELET
Abs Immature Granulocytes: 0.01 K/uL (ref 0.00–0.07)
Basophils Absolute: 0.1 K/uL (ref 0.0–0.1)
Basophils Relative: 1 %
Eosinophils Absolute: 0.1 K/uL (ref 0.0–0.5)
Eosinophils Relative: 1 %
HCT: 39.2 % (ref 36.0–46.0)
Hemoglobin: 12.2 g/dL (ref 12.0–15.0)
Immature Granulocytes: 0 %
Lymphocytes Relative: 43 %
Lymphs Abs: 2.9 K/uL (ref 0.7–4.0)
MCH: 29.8 pg (ref 26.0–34.0)
MCHC: 31.1 g/dL (ref 30.0–36.0)
MCV: 95.6 fL (ref 80.0–100.0)
Monocytes Absolute: 0.6 K/uL (ref 0.1–1.0)
Monocytes Relative: 10 %
Neutro Abs: 3 K/uL (ref 1.7–7.7)
Neutrophils Relative %: 45 %
Platelets: 238 K/uL (ref 150–400)
RBC: 4.1 MIL/uL (ref 3.87–5.11)
RDW: 13 % (ref 11.5–15.5)
WBC: 6.8 K/uL (ref 4.0–10.5)
nRBC: 0 % (ref 0.0–0.2)

## 2024-07-06 LAB — I-STAT CHEM 8, ED
BUN: 9 mg/dL (ref 6–20)
Calcium, Ion: 1.15 mmol/L (ref 1.15–1.40)
Chloride: 111 mmol/L (ref 98–111)
Creatinine, Ser: 1.1 mg/dL — ABNORMAL HIGH (ref 0.44–1.00)
Glucose, Bld: 87 mg/dL (ref 70–99)
HCT: 39 % (ref 36.0–46.0)
Hemoglobin: 13.3 g/dL (ref 12.0–15.0)
Potassium: 3.9 mmol/L (ref 3.5–5.1)
Sodium: 142 mmol/L (ref 135–145)
TCO2: 19 mmol/L — ABNORMAL LOW (ref 22–32)

## 2024-07-06 LAB — URINALYSIS, ROUTINE W REFLEX MICROSCOPIC
Bacteria, UA: NONE SEEN
Bilirubin Urine: NEGATIVE
Glucose, UA: NEGATIVE mg/dL
Ketones, ur: 20 mg/dL — AB
Leukocytes,Ua: NEGATIVE
Nitrite: NEGATIVE
Protein, ur: 30 mg/dL — AB
RBC / HPF: >50 RBC/hpf (ref 0–5)
Specific Gravity, Urine: 1.026 (ref 1.005–1.030)
pH: 5 (ref 5.0–8.0)

## 2024-07-06 LAB — HCG, SERUM, QUALITATIVE: Preg, Serum: NEGATIVE

## 2024-07-06 NOTE — Telephone Encounter (Signed)
 Patient has gone to ED to be evaluated.

## 2024-07-06 NOTE — ED Triage Notes (Addendum)
 Pt c/o vaginal bleeding, states her period came early and she has been bleeding heavily. Pt c/o pressure in lower abdomen and cramping. Pt states she has also been feeling weak.

## 2024-07-06 NOTE — Telephone Encounter (Signed)
 FYI Only or Action Required?: Action required by provider: update on patient condition.  Patient was last seen in primary care on 05/26/2024 by Jude Lonell BRAVO, NP.  Called Nurse Triage reporting Abdominal Cramping and Menstrual Problem.  Symptoms began yesterday.  Interventions attempted: Nothing.  Symptoms are: unchanged.  Triage Disposition: Go to ED Now (or PCP Triage)  Patient/caregiver understands and will follow disposition?: NoCopied from CRM #[id number]. Topic: Clinical - Red Word Triage >> Jul 06, 2024 10:06 AM Winona R wrote: Menstrual came on Aug 21st and came on again Sept 13th. Pt experiencing bad cramping and pressure in her vagina. She states she has never felt anything like this. Reason for Disposition  Patient sounds very sick or weak to the triager  Answer Assessment - Initial Assessment Questions Feels like a contraction. Hard to sleep. Periods have been close- August 21 and now 9/13. Period is heavy. Pt is on blood thinner. Pt felt woozy this morning. Pt hasn't taken any medication for pain.  No office appts at PCP or surrounding office. RN advised ED due to woozy feeling, being on blood thinner, heavy periods close together, and pain. Pt stated she will go.     1. SYMPTOM: What's the main symptom you're concerned about? (e.g., pain, itching, dryness)     Vaginal pressure 2. LOCATION: Where is the pressure located? (e.g., inside/outside, left/right)     Inside vagina  3. ONSET: When did the    start?     Last night  4. PAIN: Is there any pain? If Yes, ask: How bad is it? (Scale: 1-10; mild, moderate, severe)     7-8 5. ITCHING: Is there any itching? If Yes, ask: How bad is it? (Scale: 1-10; mild, moderate, severe)     na 6. CAUSE: What do you think is causing the discharge? Have you had the same problem before? What happened then?     Not sure 7. OTHER SYMPTOMS: Do you have any other symptoms? (e.g., fever, itching, vaginal bleeding, pain  with urination, injury to genital area, vaginal foreign body)     Vaginal bleeding  Protocols used: Vaginal Symptoms-A-AH

## 2024-07-07 LAB — WET PREP, GENITAL
Clue Cells Wet Prep HPF POC: NONE SEEN
Sperm: NONE SEEN
Trich, Wet Prep: NONE SEEN
WBC, Wet Prep HPF POC: <10 (ref <10)
Yeast Wet Prep HPF POC: NONE SEEN

## 2024-07-07 LAB — GC/CHLAMYDIA PROBE AMP (~~LOC~~) NOT AT ARMC
Chlamydia: NEGATIVE
Comment: NEGATIVE
Comment: NORMAL
Neisseria Gonorrhea: NEGATIVE

## 2024-07-07 NOTE — ED Provider Notes (Signed)
 [place] [facility]ERG[facility] DE[facility]MENT AT [facility] Provider Note   CSN: [id number] Arrival date & time: 07/06/24  1325     Patient presents with: Vaginal Bleeding   Barbara Dunn is a 53 y.o. female.   Vaginal Bleeding Associated symptoms: abdominal pain   Patient is a 53 year old female presenting ED today for concerns for vaginal bleeding that is been ongoing x 3 days, noted to be early by 1 week.  States that this has been accompanied with vaginal pressure as well as lower abdominal cramps.  States that she has been off of birth control for 1 year having been on it secondary to heavy menstrual cycles and pain associated with them.  Until now menstrual cycles were every 4 to 5 weeks.  States that this 1 has been heavier than those requiring 18 tampons a day with pad.  She now feels that she has associated lightheadedness.  Notes that since arriving to the ED she has had decreasing symptoms, less pain and bleeding has slowed down.  Previous medical history of Obesity, anemia, migraines, nephrolithiasis, CKD, PE/DVT, asthma.  Anticoagulated on Eliquis  with no reported missed doses month.  Denies fever, headache, blurry vision, vertigo, chest pain, shortness of breath, nausea, vomiting, diarrhea, hematochezia, melena, dysuria, lower leg swelling.     Prior to Admission medications   Medication Sig Start Date End Date Taking? Authorizing Provider  albuterol  (VENTOLIN  HFA) 108 (90 Base) MCG/ACT inhaler Inhale 1 puff into the lungs every 6 (six) hours as needed for wheezing and shortness of breath 03/24/24   Delbert Clam, MD  amLODipine  (NORVASC ) 2.5 MG tablet Take 1 tablet (2.5 mg total) by mouth daily. 05/18/24   Newlin, Enobong, MD  apixaban  (ELIQUIS ) 5 MG TABS tablet Take 1 tablet (5 mg total) by mouth 2 (two) times daily. Hematology needs to send future refills. 06/15/24   Neomi Johnston DASEN, PA-C  APPLE CIDER VINEGAR PO Take 1 capsule by mouth daily.    [provider]   BIOTIN PO Take 2 tablets by mouth daily.     [provider]  cetirizine (ZYRTEC) 10 MG tablet Take 10 mg by mouth daily.    [provider]  [employer] FIBER GUMMY BEARS CHILDREN PO Take by mouth. Also contains B12, vitamin C, and vitamin D     [provider]  docusate sodium (COLACE) 100 MG capsule Take 100 mg by mouth as needed.     [provider]  ELDERBERRY PO Take by mouth.    [provider]  fluticasone  (FLONASE ) 50 MCG/ACT nasal spray PLACE 2 SPRAYS INTO BOTH NOSTRILS DAILY. 01/11/20   Newlin, Enobong, MD  fluticasone  (FLOVENT  HFA) 110 MCG/ACT inhaler INHALE 1 PUFF INTO THE LUNGS 2 (TWO) TIMES DAILY. 07/18/22   Newlin, Enobong, MD  hydrOXYzine  (ATARAX ) 25 MG tablet Take 1 tablet (25 mg total) by mouth at bedtime as needed. 11/19/23   Newlin, Enobong, MD  Iron , Ferrous Sulfate , 325 (65 Fe) MG TABS Take 325 mg by mouth 2 (two) times daily. 09/18/23   Newlin, Enobong, MD  lisinopril  (ZESTRIL ) 20 MG tablet Take 1 tablet (20 mg total) by mouth daily. 05/18/24   Newlin, Enobong, MD  Magnesium 250 MG TABS Take 250 mg by mouth daily.    [provider]  meclizine  (ANTIVERT ) 25 MG tablet Take 1 tablet (25 mg total) by mouth 3 (three) times daily as needed for dizziness. 11/19/23   Newlin, Enobong, MD  Multiple Vitamins-Minerals (MULTIVITAMIN ADULT PO) Take 1 tablet by  mouth daily.     [provider]  oxymetazoline  (AFRIN NASAL SPRAY) 0.05 % nasal spray Place 1 spray into both nostrils 2 (two) times daily. 10/31/21   Newlin, Enobong, MD  rosuvastatin  (CRESTOR ) 10 MG tablet Take 1 tablet (10 mg total) by mouth daily. 01/01/24 06/12/24  Vannie Reche RAMAN, NP  SUMAtriptan  (IMITREX ) 100 MG tablet Take 1 tablet (100 mg total) by mouth once for 1 dose at the onset of a migraine. May repeat 1 tablet in 2 hours if migraine persists.  Maximum daily dose 200 mg 11/19/23 02/19/24  Newlin, Enobong, MD  topiramate  (TOPAMAX ) 50 MG tablet Take 1 tablet (50 mg total)  by mouth 2 (two) times daily. 05/18/24   Newlin, Enobong, MD  triamcinolone  cream (KENALOG ) 0.1 % APPLY 1 APPLICATION TOPICALLY 2 (TWO) TIMES DAILY. 04/06/24   Newlin, Enobong, MD  vitamin C (ASCORBIC ACID) 250 MG tablet Take 250 mg by mouth daily.    [provider]  diphenhydrAMINE  (BENADRYL ) 25 mg capsule Take 25 mg by mouth every 6 (six) hours as needed for itching. Reported on 01/23/2016  12/07/19  [provider]    Allergies: Patient has no known allergies.    Review of Systems  Gastrointestinal:  Positive for abdominal pain.  Genitourinary:  Positive for vaginal bleeding.  All other systems reviewed and are negative.   Updated Vital Signs BP (!) 125/104 (BP Location: Right Arm)   Pulse (!) 56   Temp 98 F (36.7 C) (Oral)   Resp 20   Ht 5' 1 (1.549 m)   Wt 85.7 kg   LMP 07/04/2024 (Approximate)   SpO2 100%   BMI 35.71 kg/m   Physical Exam Vitals and nursing note reviewed. Exam conducted with a chaperone present.  Constitutional:      General: She is not in acute distress.    Appearance: Normal appearance. She is not ill-appearing or diaphoretic.  HENT:     Head: Normocephalic and atraumatic.  Eyes:     General: No scleral icterus.       Right eye: No discharge.        Left eye: No discharge.     Extraocular Movements: Extraocular movements intact.     Conjunctiva/sclera: Conjunctivae normal.  Cardiovascular:     Rate and Rhythm: Normal rate and regular rhythm.     Pulses: Normal pulses.     Heart sounds: Normal heart sounds. No murmur heard.    No friction rub. No gallop.  Pulmonary:     Effort: Pulmonary effort is normal. No respiratory distress.     Breath sounds: No stridor. No wheezing, rhonchi or rales.  Chest:     Chest wall: No tenderness.  Abdominal:     General: Abdomen is flat. There is no distension.     Palpations: Abdomen is soft.     Tenderness: There is no abdominal tenderness. There is no right CVA tenderness, left CVA  tenderness, guarding or rebound.  Genitourinary:    Exam position: Lithotomy position.     Vagina: No signs of injury. Bleeding present. No vaginal discharge, erythema or tenderness.     Cervix: Cervical bleeding present. No cervical motion tenderness.     Uterus: Not tender.      Adnexa:        Right: No tenderness.         Left: No tenderness.    Musculoskeletal:        General: No swelling, deformity or signs of injury.  Cervical back: Normal range of motion. No rigidity.     Right lower leg: No edema.     Left lower leg: No edema.  Skin:    General: Skin is warm and dry.     Findings: No bruising, erythema or lesion.  Neurological:     General: No focal deficit present.     Mental Status: She is alert and oriented to person, place, and time. Mental status is at baseline.     Sensory: No sensory deficit.     Motor: No weakness.  Psychiatric:        Mood and Affect: Mood normal.     (all labs ordered are listed, but only abnormal results are displayed) Labs Reviewed  URINALYSIS, ROUTINE W REFLEX MICROSCOPIC - Abnormal; Notable for the following components:      Result Value   APPearance HAZY (*)    Hgb urine dipstick MODERATE (*)    Ketones, ur 20 (*)    Protein, ur 30 (*)    All other components within normal limits  I-STAT CHEM 8, ED - Abnormal; Notable for the following components:   Creatinine, Ser 1.10 (*)    TCO2 19 (*)    All other components within normal limits  WET PREP, GENITAL  CBC WITH DIFFERENTIAL/PLATELET  HCG, SERUM, QUALITATIVE  GC/CHLAMYDIA PROBE AMP ([place]) NOT AT [facility]    EKG: None  Radiology: No results found.  Procedures   Medications Ordered in the ED - No data to display                                  Medical Decision Making Amount and/or Complexity of Data Reviewed Labs: ordered.   This patient is a 53 year old female who presents to the ED for concern of vaginal/lower abdominal cramping x 3 days with heavier vaginal  bleeding that is earlier than her normal menstrual cycle by 1 week.  Noted to have come off of birth control approximately 1 year ago after being chronically on it for heavy periods that were persistent/irregular.  Initially was having more severe discomfort and was told to come to the ED for evaluation due to her OB/GYN office being unable to see her.  On physical exam, patient is in no acute distress, afebrile, alert and orient x 4, speaking in full sentences, nontachypneic, nontachycardic.  Mild lower abdominal tenderness noted to palpation, no guarding or other peritoneal signs.  Blood noted in the vaginal vault however no cervical motion tenderness, no discharge noted, no injuries noted.  Lab work was done and showed stable hemoglobin.  Otherwise noting some dehydration but no other acute findings.  Low suspicion for any emergent condition present this time.  Patient has no cervical motion tenderness, no report history of drainage, and only mild tenderness to palpation.  With vaginal bleeding present this time, suspecting likely irregular.  Per her usual before being on birth control. Will have continue to follow-up with OB/GYN and return for new or worsening symptoms.  GC pending, will treat if positive.  Patient vital signs have remained stable throughout the course of patient's time in the ED. Low suspicion for any other emergent pathology at this time. I believe this patient is safe to be discharged. Provided strict return to ER precautions. Patient expressed agreement and understanding of plan. All questions were answered.  Differential diagnoses prior to evaluation: The emergent differential diagnosis includes, but is not limited  to,  Abnormal uterine bleeding, vaginal/cervical trauma, STD/PID, subchorionic hemorrhage/hematoma, threatened miscarriage, incomplete miscarriage, normal bleeding in early trimester pregnancy, ectopic pregnancy. This is not an exhaustive differential.   Past  Medical History / Co-morbidities / Social History: Obesity, anemia, migraines, nephrolithiasis, CKD, PE/DVT, asthma  Additional history: Chart reviewed. Pertinent results include:   Last seen by PCP on 05/18/2024, provided Diflucan  for yeast infection symptoms.  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  Lab Tests/Imaging studies: I personally interpreted labs/imaging and the pertinent results include:   CBC unremarkable I-STAT Chem-8 shows mildly elevated creatinine 1.1 and UA notes ketones likely secondary to dehydration with patient stating she has not had anything to drink in over 20 hours Wet prep unremarkable GC pending hCG qualitative negative . Considered ultrasound as well as CT imaging which I believe is warranted at this time.    Medications:  I have reviewed the patients home medicines and have made adjustments as needed.  Critical Interventions: None  Social Determinants of Health: None  Disposition: After consideration of the diagnostic results and the patients response to treatment, I feel that the patient would benefit from discharge and treatment as above.   emergency department workup does not suggest an emergent condition requiring admission or immediate intervention beyond what has been performed at this time. The plan is: Follow-up with PCP/OB/GYN, return for new or worsening symptoms. The patient is safe for discharge and has been instructed to return immediately for worsening symptoms, change in symptoms or any other concerns.   Final diagnoses:  Vaginal bleeding    ED Discharge Orders     None          Beola Terrall GORMAN DEVONNA 07/07/24 0603    Raford Lenis, MD 07/07/24 [phone]

## 2024-07-07 NOTE — Discharge Instructions (Signed)
 You were seen today for vaginal bleeding.  Your lab work and physical exam today were very reassuring that low suspicion for any emergent cause your symptoms today.  Please be sure to continue to follow-up with your OB/GYN for further evaluation though.  In the meantime, use Tylenol  and ibuprofen  for pain relief.  Take Tylenol  (acetominophen)  650mg  every 4-6 hours, as needed for pain or fever. Do not take more than 4,000 mg in a 24-hour period. As this may cause liver damage. While this is rare, if you begin to develop yellowing of the skin or eyes, stop taking and return to ER immediately.  Take Ibuprofen  400mg  every 4-6 hours for pain or fever, not exceeding 3,200 mg per day as more than 3,200mg  can cause Stomach irritation, dizziness, kidney issues with long-term use.  If your symptoms worsen or have any new symptoms, including chest pain, shortness of breath, confusion, uncontrollable pain, faintness, please return to the ED for further evaluation.

## 2024-07-07 NOTE — ED Notes (Signed)
Pt requesting work note

## 2024-07-14 DIAGNOSIS — D509 Iron deficiency anemia, unspecified: Secondary | ICD-10-CM | POA: Diagnosis not present

## 2024-07-14 DIAGNOSIS — N921 Excessive and frequent menstruation with irregular cycle: Secondary | ICD-10-CM | POA: Diagnosis not present

## 2024-07-19 ENCOUNTER — Other Ambulatory Visit: Payer: Self-pay | Admitting: Physician Assistant

## 2024-07-21 ENCOUNTER — Other Ambulatory Visit (HOSPITAL_COMMUNITY): Payer: Self-pay

## 2024-07-21 MED ORDER — APIXABAN 5 MG PO TABS
5.0000 mg | ORAL_TABLET | Freq: Two times a day (BID) | ORAL | 0 refills | Status: DC
  Filled 2024-07-21: qty 60, 30d supply, fill #0

## 2024-07-22 ENCOUNTER — Other Ambulatory Visit (HOSPITAL_COMMUNITY): Payer: Self-pay

## 2024-07-28 ENCOUNTER — Other Ambulatory Visit: Payer: Self-pay | Admitting: Hematology and Oncology

## 2024-07-28 ENCOUNTER — Inpatient Hospital Stay: Attending: Physician Assistant

## 2024-07-28 ENCOUNTER — Other Ambulatory Visit: Payer: Self-pay | Admitting: Physician Assistant

## 2024-07-28 DIAGNOSIS — D472 Monoclonal gammopathy: Secondary | ICD-10-CM

## 2024-07-28 DIAGNOSIS — Z86718 Personal history of other venous thrombosis and embolism: Secondary | ICD-10-CM | POA: Diagnosis not present

## 2024-07-28 DIAGNOSIS — D259 Leiomyoma of uterus, unspecified: Secondary | ICD-10-CM | POA: Diagnosis not present

## 2024-07-28 DIAGNOSIS — D500 Iron deficiency anemia secondary to blood loss (chronic): Secondary | ICD-10-CM

## 2024-07-28 DIAGNOSIS — N920 Excessive and frequent menstruation with regular cycle: Secondary | ICD-10-CM | POA: Diagnosis not present

## 2024-07-28 DIAGNOSIS — C7951 Secondary malignant neoplasm of bone: Secondary | ICD-10-CM | POA: Insufficient documentation

## 2024-07-28 DIAGNOSIS — Z7901 Long term (current) use of anticoagulants: Secondary | ICD-10-CM | POA: Insufficient documentation

## 2024-07-28 DIAGNOSIS — Z87891 Personal history of nicotine dependence: Secondary | ICD-10-CM | POA: Insufficient documentation

## 2024-07-28 LAB — RETIC PANEL
Immature Retic Fract: 5.2 % (ref 2.3–15.9)
RBC.: 4.03 MIL/uL (ref 3.87–5.11)
Retic Count, Absolute: 31.8 K/uL (ref 19.0–186.0)
Retic Ct Pct: 0.8 % (ref 0.4–3.1)
Reticulocyte Hemoglobin: 33.9 pg (ref >27.9)

## 2024-07-28 LAB — LACTATE DEHYDROGENASE: LDH: 181 U/L (ref 98–192)

## 2024-07-28 LAB — CBC WITH DIFFERENTIAL (CANCER CENTER ONLY)
Abs Immature Granulocytes: 0.01 K/uL (ref 0.00–0.07)
Basophils Absolute: 0.1 K/uL (ref 0.0–0.1)
Basophils Relative: 1 %
Eosinophils Absolute: 0.1 K/uL (ref 0.0–0.5)
Eosinophils Relative: 2 %
HCT: 37.2 % (ref 36.0–46.0)
Hemoglobin: 12.1 g/dL (ref 12.0–15.0)
Immature Granulocytes: 0 %
Lymphocytes Relative: 42 %
Lymphs Abs: 2.8 K/uL (ref 0.7–4.0)
MCH: 29.2 pg (ref 26.0–34.0)
MCHC: 32.5 g/dL (ref 30.0–36.0)
MCV: 89.6 fL (ref 80.0–100.0)
Monocytes Absolute: 0.6 K/uL (ref 0.1–1.0)
Monocytes Relative: 10 %
Neutro Abs: 3.1 K/uL (ref 1.7–7.7)
Neutrophils Relative %: 45 %
Platelet Count: 221 K/uL (ref 150–400)
RBC: 4.15 MIL/uL (ref 3.87–5.11)
RDW: 13.2 % (ref 11.5–15.5)
WBC Count: 6.7 K/uL (ref 4.0–10.5)
nRBC: 0 % (ref 0.0–0.2)

## 2024-07-28 LAB — CMP (CANCER CENTER ONLY)
ALT: 12 U/L (ref 0–44)
AST: 17 U/L (ref 15–41)
Albumin: 4.3 g/dL (ref 3.5–5.0)
Alkaline Phosphatase: 51 U/L (ref 38–126)
Anion gap: 5 (ref 5–15)
BUN: 13 mg/dL (ref 6–20)
CO2: 24 mmol/L (ref 22–32)
Calcium: 9.3 mg/dL (ref 8.9–10.3)
Chloride: 111 mmol/L (ref 98–111)
Creatinine: 1.04 mg/dL — ABNORMAL HIGH (ref 0.44–1.00)
GFR, Estimated: >60 mL/min (ref >60)
Glucose, Bld: 83 mg/dL (ref 70–99)
Potassium: 3.6 mmol/L (ref 3.5–5.1)
Sodium: 140 mmol/L (ref 135–145)
Total Bilirubin: 0.5 mg/dL (ref 0.0–1.2)
Total Protein: 7.6 g/dL (ref 6.5–8.1)

## 2024-07-28 LAB — IRON AND IRON BINDING CAPACITY (CC-WL,HP ONLY)
Iron: 72 ug/dL (ref 28–170)
Saturation Ratios: 22 % (ref 10.4–31.8)
TIBC: 326 ug/dL (ref 250–450)
UIBC: 254 ug/dL (ref 148–442)

## 2024-07-28 LAB — FERRITIN: Ferritin: 29 ng/mL (ref 11–307)

## 2024-07-29 ENCOUNTER — Other Ambulatory Visit (HOSPITAL_COMMUNITY): Payer: Self-pay

## 2024-07-29 LAB — KAPPA/LAMBDA LIGHT CHAINS
Kappa free light chain: 32.6 mg/L — ABNORMAL HIGH (ref 3.3–19.4)
Kappa, lambda light chain ratio: 2.41 — ABNORMAL HIGH (ref 0.26–1.65)
Lambda free light chains: 13.5 mg/L (ref 5.7–26.3)

## 2024-07-31 LAB — MULTIPLE MYELOMA PANEL, SERUM
Albumin SerPl Elph-Mcnc: 3.9 g/dL (ref 2.9–4.4)
Albumin/Glob SerPl: 1.3 (ref 0.7–1.7)
Alpha 1: 0.2 g/dL (ref 0.0–0.4)
Alpha2 Glob SerPl Elph-Mcnc: 0.7 g/dL (ref 0.4–1.0)
B-Globulin SerPl Elph-Mcnc: 1.1 g/dL (ref 0.7–1.3)
Gamma Glob SerPl Elph-Mcnc: 1.1 g/dL (ref 0.4–1.8)
Globulin, Total: 3.2 g/dL (ref 2.2–3.9)
IgA: 323 mg/dL (ref 87–352)
IgG (Immunoglobin G), Serum: 1375 mg/dL (ref 586–1602)
IgM (Immunoglobulin M), Srm: 26 mg/dL (ref 26–217)
M Protein SerPl Elph-Mcnc: 0.6 g/dL — ABNORMAL HIGH
Total Protein ELP: 7.1 g/dL (ref 6.0–8.5)

## 2024-08-05 DIAGNOSIS — N924 Excessive bleeding in the premenopausal period: Secondary | ICD-10-CM | POA: Diagnosis not present

## 2024-08-05 DIAGNOSIS — N926 Irregular menstruation, unspecified: Secondary | ICD-10-CM | POA: Diagnosis not present

## 2024-08-05 DIAGNOSIS — D259 Leiomyoma of uterus, unspecified: Secondary | ICD-10-CM | POA: Diagnosis not present

## 2024-08-11 ENCOUNTER — Inpatient Hospital Stay (HOSPITAL_BASED_OUTPATIENT_CLINIC_OR_DEPARTMENT_OTHER): Admitting: Physician Assistant

## 2024-08-11 ENCOUNTER — Telehealth: Payer: Self-pay | Admitting: Pharmacy Technician

## 2024-08-11 ENCOUNTER — Other Ambulatory Visit: Payer: Self-pay

## 2024-08-11 VITALS — BP 134/87 | HR 61 | Temp 97.5°F | Resp 17 | Wt 187.0 lb

## 2024-08-11 DIAGNOSIS — D259 Leiomyoma of uterus, unspecified: Secondary | ICD-10-CM | POA: Diagnosis not present

## 2024-08-11 DIAGNOSIS — D500 Iron deficiency anemia secondary to blood loss (chronic): Secondary | ICD-10-CM | POA: Diagnosis not present

## 2024-08-11 DIAGNOSIS — C7951 Secondary malignant neoplasm of bone: Secondary | ICD-10-CM | POA: Diagnosis not present

## 2024-08-11 DIAGNOSIS — Z86718 Personal history of other venous thrombosis and embolism: Secondary | ICD-10-CM

## 2024-08-11 DIAGNOSIS — Z7901 Long term (current) use of anticoagulants: Secondary | ICD-10-CM | POA: Diagnosis not present

## 2024-08-11 DIAGNOSIS — N920 Excessive and frequent menstruation with regular cycle: Secondary | ICD-10-CM | POA: Diagnosis not present

## 2024-08-11 DIAGNOSIS — D472 Monoclonal gammopathy: Secondary | ICD-10-CM

## 2024-08-11 DIAGNOSIS — Z87891 Personal history of nicotine dependence: Secondary | ICD-10-CM | POA: Diagnosis not present

## 2024-08-11 MED ORDER — APIXABAN 5 MG PO TABS
5.0000 mg | ORAL_TABLET | Freq: Two times a day (BID) | ORAL | 0 refills | Status: DC
  Filled 2024-08-25: qty 60, 30d supply, fill #0

## 2024-08-11 NOTE — Progress Notes (Signed)
 [facility] [phone]   [phone]  PROGRESS NOTE  Patient Care Team: Delbert Clam, MD as PCP - General (Family Medicine) Lonni Slain, MD as PCP - Cardiology (Cardiology) Wonda Cy BROCKS, RD as Dietitian [facility])  Hematological/Oncological History # IgG Kappa MGUS # Iron  Deficiency Anemia 2/2 to GYN Bleeding #  Lower Extremity DVTs 05/31/2023: last visit with Dr. Bernie 11/11/2023: establish care with Dr. Federico   Interval History:  Barbara Dunn 53 y.o. female with medical history significant for MGUS, IDA, and prior DVT who presents for a follow up visit. The patient's last visit was on 02/11/2024.  In the interim, she denies any changes to her health.  On exam today Barbara Dunn reports having intermittent episodes of fatigue but she continues to complete her ADLs on her own.  She has heavy menstrual cycles since discontinuing her hormone therapy due to history of DVTs.  In addition, she underwent pelvic and transvaginal ultrasound which confirmed fibroids.  She denies fevers, chills, sweats, shortness of breath, chest pain, cough, bone pain, headaches or dizziness.  A full 10 point ROS is otherwise negative.  MEDICAL HISTORY:  Past Medical History:  Diagnosis Date   Anemia    Arthritis    Asthma    Bronchitis    Chronic kidney disease    DVT (deep venous thrombosis) (HCC)    Hypertension    Kidney stone    Migraines    Obesity    Pulmonary embolism (HCC)     SURGICAL HISTORY: Past Surgical History:  Procedure Laterality Date   CARDIAC CATHETERIZATION N/A 08/02/2015   Procedure: Left Heart Cath and Coronary Angiography;  Surgeon: Debby DELENA Sor, MD;  Location: [facility] INVASIVE CV LAB;  Service: Cardiovascular;  Laterality: N/A;   CERVIX LESION DESTRUCTION     TUBAL LIGATION     TUBAL LIGATION      SOCIAL HISTORY: Social History   Socioeconomic History   Marital status: Single    Spouse name: Not on file   Number  of children: Not on file   Years of education: Not on file   Highest education level: Not on file  Occupational History   Not on file  Tobacco Use   Smoking status: Former    Current packs/day: 0.00    Types: Cigarettes    Quit date: 12/15/2003    Years since quitting: 20.6   Smokeless tobacco: Never   Tobacco comments:    quit 10 years ago  Vaping Use   Vaping status: Never Used  Substance and Sexual Activity   Alcohol use: Yes    Alcohol/week: 2.0 standard drinks of alcohol    Types: 1 Glasses of wine, 1 Shots of liquor per week    Comment: Once every 3 months    Drug use: No   Sexual activity: Yes    Birth control/protection: Other-see comments    Comment: tubal  Other Topics Concern   Not on file  Social History Narrative   Pt has GED. CNA as well.    Social Drivers of Health   Financial Resource Strain: Medium Risk (11/19/2023)   Overall Financial Resource Strain (CARDIA)    Difficulty of Paying Living Expenses: Somewhat hard  Food Insecurity: Food Insecurity Present (11/19/2023)   Hunger Vital Sign    Worried About Running Out of Food in the Last Year: Sometimes true    Ran Out of Food in the Last Year: Sometimes true  Transportation Needs: No Transportation Needs (11/19/2023)  PRAPARE - Administrator, Civil Service (Medical): No    Lack of Transportation (Non-Medical): No  Physical Activity: Inactive (11/19/2023)   Exercise Vital Sign    Days of Exercise per Week: 0 days    Minutes of Exercise per Session: 0 min  Stress: Stress Concern Present (11/19/2023)   [employer] of Occupational Health - Occupational Stress Questionnaire    Feeling of Stress : To some extent  Social Connections: Moderately Isolated (11/19/2023)   Social Connection and Isolation Panel    Frequency of Communication with Friends and Family: Three times a week    Frequency of Social Gatherings with Friends and Family: Once a week    Attends Religious Services: Never     Database administrator or Organizations: No    Attends Engineer, structural: 1 to 4 times per year    Marital Status: Never married  Intimate Partner Violence: Not At Risk (11/19/2023)   Humiliation, Afraid, Rape, and Kick questionnaire    Fear of Current or Ex-Partner: No    Emotionally Abused: No    Physically Abused: No    Sexually Abused: No    FAMILY HISTORY: Family History  Problem Relation Age of Onset   Hypertension Mother    Schizophrenia Mother    Schizophrenia Sister    Schizophrenia Brother    Schizophrenia Brother    Schizophrenia Son    Colon cancer Neg Hx    Esophageal cancer Neg Hx    Colon polyps Neg Hx    Rectal cancer Neg Hx    Stomach cancer Neg Hx     ALLERGIES:  has no known allergies.  MEDICATIONS:  Current Outpatient Medications  Medication Sig Dispense Refill   albuterol  (VENTOLIN  HFA) 108 (90 Base) MCG/ACT inhaler Inhale 1 puff into the lungs every 6 (six) hours as needed for wheezing and shortness of breath 6.7 g 1   amLODipine  (NORVASC ) 2.5 MG tablet Take 1 tablet (2.5 mg total) by mouth daily. 90 tablet 1   APPLE CIDER VINEGAR PO Take 1 capsule by mouth daily.     BIOTIN PO Take 2 tablets by mouth daily.      cetirizine (ZYRTEC) 10 MG tablet Take 10 mg by mouth daily.     [employer] FIBER GUMMY BEARS CHILDREN PO Take by mouth. Also contains B12, vitamin C, and vitamin D      docusate sodium (COLACE) 100 MG capsule Take 100 mg by mouth as needed.      ELDERBERRY PO Take by mouth.     fluticasone  (FLONASE ) 50 MCG/ACT nasal spray PLACE 2 SPRAYS INTO BOTH NOSTRILS DAILY. 16 g 2   fluticasone  (FLOVENT  HFA) 110 MCG/ACT inhaler INHALE 1 PUFF INTO THE LUNGS 2 (TWO) TIMES DAILY. 12 g 2   hydrOXYzine  (ATARAX ) 25 MG tablet Take 1 tablet (25 mg total) by mouth at bedtime as needed. 90 tablet 1   Iron , Ferrous Sulfate , 325 (65 Fe) MG TABS Take 325 mg by mouth 2 (two) times daily. 60 tablet 3   lisinopril  (ZESTRIL ) 20 MG tablet Take 1 tablet (20 mg total) by  mouth daily. 90 tablet 1   Magnesium 250 MG TABS Take 250 mg by mouth daily.     meclizine  (ANTIVERT ) 25 MG tablet Take 1 tablet (25 mg total) by mouth 3 (three) times daily as needed for dizziness. 60 tablet 1   Multiple Vitamins-Minerals (MULTIVITAMIN ADULT PO) Take 1 tablet by mouth daily.      oxymetazoline  (AFRIN  NASAL SPRAY) 0.05 % nasal spray Place 1 spray into both nostrils 2 (two) times daily. 30 mL 0   SUMAtriptan  (IMITREX ) 100 MG tablet Take 1 tablet (100 mg total) by mouth once for 1 dose at the onset of a migraine. May repeat 1 tablet in 2 hours if migraine persists.  Maximum daily dose 200 mg 10 tablet 6   topiramate  (TOPAMAX ) 50 MG tablet Take 1 tablet (50 mg total) by mouth 2 (two) times daily. 180 tablet 1   triamcinolone  cream (KENALOG ) 0.1 % APPLY 1 APPLICATION TOPICALLY 2 (TWO) TIMES DAILY. 30 g 2   vitamin C (ASCORBIC ACID) 250 MG tablet Take 250 mg by mouth daily.     apixaban  (ELIQUIS ) 5 MG TABS tablet Take 1 tablet (5 mg total) by mouth 2 (two) times daily. Hematology needs to send future refills. 60 tablet 0   rosuvastatin  (CRESTOR ) 10 MG tablet Take 1 tablet (10 mg total) by mouth daily. (Patient not taking: Reported on 08/11/2024) 90 tablet 3   No current facility-administered medications for this visit.    REVIEW OF SYSTEMS:   Constitutional: ( - ) fevers, ( - )  chills , ( - ) night sweats Eyes: ( - ) blurriness of vision, ( - ) double vision, ( - ) watery eyes Ears, nose, mouth, throat, and face: ( - ) mucositis, ( - ) sore throat Respiratory: ( - ) cough, ( - ) dyspnea, ( - ) wheezes Cardiovascular: ( - ) palpitation, ( - ) chest discomfort, ( - ) lower extremity swelling Gastrointestinal:  ( - ) nausea, ( - ) heartburn, ( - ) change in bowel habits Skin: ( - ) abnormal skin rashes Lymphatics: ( - ) new lymphadenopathy, ( - ) easy bruising Neurological: ( - ) numbness, ( - ) tingling, ( - ) new weaknesses Behavioral/Psych: ( - ) mood change, ( - ) new changes   All other systems were reviewed with the patient and are negative.  PHYSICAL EXAMINATION: Vitals:   08/11/24 1121  BP: 134/87  Pulse: 61  Resp: 17  Temp: (!) 97.5 F (36.4 C)  SpO2: 100%   Filed Weights   08/11/24 1121  Weight: 187 lb (84.8 kg)    GENERAL: Well-appearing middle-age African-American female, alert, no distress and comfortable SKIN: skin color, texture, turgor are normal, no rashes or significant lesions EYES: conjunctiva are pink and non-injected, sclera clear LUNGS: clear to auscultation and percussion with normal breathing effort HEART: regular rate & rhythm and no murmurs and no lower extremity edema Musculoskeletal: no cyanosis of digits and no clubbing  PSYCH: alert & oriented x 3, fluent speech NEURO: no focal motor/sensory deficits  LABORATORY DATA:  I have reviewed the data as listed    Latest Ref Rng & Units 07/28/2024    1:38 PM 07/06/2024    4:06 PM 07/06/2024    4:05 PM  CBC  WBC 4.0 - 10.5 K/uL 6.7   6.8   Hemoglobin 12.0 - 15.0 g/dL 87.8  86.6  87.7   Hematocrit 36.0 - 46.0 % 37.2  39.0  39.2   Platelets 150 - 400 K/uL 221   238        Latest Ref Rng & Units 07/28/2024    1:38 PM 07/06/2024    4:06 PM 05/12/2024   11:29 AM  CMP  Glucose 70 - 99 mg/dL 83  87  89   BUN 6 - 20 mg/dL 13  9  8  Creatinine 0.44 - 1.00 mg/dL 8.95  8.89  9.11   Sodium 135 - 145 mmol/L 140  142  140   Potassium 3.5 - 5.1 mmol/L 3.6  3.9  3.8   Chloride 98 - 111 mmol/L 111  111  110   CO2 22 - 32 mmol/L 24   26   Calcium  8.9 - 10.3 mg/dL 9.3   8.7   Total Protein 6.5 - 8.1 g/dL 7.6   6.8   Total Bilirubin 0.0 - 1.2 mg/dL 0.5   0.4   Alkaline Phos 38 - 126 U/L 51   49   AST 15 - 41 U/L 17   20   ALT 0 - 44 U/L 12   13     Lab Results  Component Value Date   MPROTEIN 0.6 (H) 07/28/2024   MPROTEIN 0.4 (H) 02/04/2024   MPROTEIN 0.5 (H) 11/11/2023   Lab Results  Component Value Date   KPAFRELGTCHN 32.6 (H) 07/28/2024   KPAFRELGTCHN 34.7 (H) 02/04/2024    KPAFRELGTCHN 37.6 (H) 11/11/2023   LAMBDASER 13.5 07/28/2024   LAMBDASER 13.5 02/04/2024   LAMBDASER 12.6 11/11/2023   KAPLAMBRATIO 2.41 (H) 07/28/2024   KAPLAMBRATIO 2.57 (H) 02/04/2024   KAPLAMBRATIO 2.98 (H) 11/11/2023    RADIOGRAPHIC STUDIES: I have personally reviewed the radiological images as listed and agreed with the findings in the report. No results found.   ASSESSMENT & PLAN Barbara Dunn is a 53 y.o. female with medical history significant for MGUS, IDA, and prior DVT who presents for a follow up visit.   # IgG Kappa MGUS -- Labs from 07/28/2024 shows overall stable findings.  No cytopenias with WBC 6.7, hemoglobin 12.1, platelet 221.  Creatinine is borderline elevated at 1.04.  Calcium  levels are normal.  SPEP showed stable M protein measuring 0.6 g/dL.  Kappa light chain stable at 32.6, ratio 2.41. --Bone met survey from 02/13/2024 shows no evidence of lytic lesions.  Repeat annually, next one due April 2026. --No clinical signs or symptoms of transformation to active myeloma. --Continue with surveillance and repeat labs in 6 months.   # Iron  Deficiency Anemia 2/2 to GYN Bleeding --Continue ferrous sulfate  325 mg p.o. daily --Last received IV venofer  200 mg x 5 doses from 12/17/2023-01/14/2024.  -- Labs from 07/28/2024 shows no evidence of anemia with hemoglobin 12.1, MCV 89.6.  Iron  panel shows borderline deficiency with ferritin 29. -- Will arrange for IV Venofer  200 mg x 3 doses to bolster iron  levels. --Patient will follow up with OB/GYN in January 2026 to discuss interventions for heavy cycles and uterine fibroids.   # Lower Extremity DVTs --First episode was secondary to birth control, estrogen based.   --Subsequent blood clot was unprovoked in nature.Recommend indefinite anticoagulation --Workup from 02/29/2024 including cardiolipin abs, beta-2 -glycoprotein abs, factor 5 leiden and prothrombin gene. All negative. -- Continue Eliquis  5 mg twice daily.  Refill sent  today.   No orders of the defined types were placed in this encounter.   All questions were answered. The patient knows to call the clinic with any problems, questions or concerns.  I have spent a total of 25 minutes minutes of face-to-face and non-face-to-face time, preparing to see the patient, performing a medically appropriate examination, counseling and educating the patient, ordering medications/tests/procedures, documenting clinical information in the electronic health record, independently interpreting results and communicating results to the patient, and care coordination.   Johnston Police PA-C Dept of Hematology and Oncology [facility]  Hospital Phone: [phone]   08/11/2024 1:38 PM

## 2024-08-11 NOTE — Telephone Encounter (Addendum)
 Auth Submission: NO AUTH NEEDED Site of care: Site of care: [facility] Payer: [employer] Medication & CPT/J Code(s) submitted: Venofer  (Iron  Sucrose) J1756 Diagnosis Code:  Route of submission (phone, fax, portal):  Phone # Fax # Auth type: Buy/Bill PB Units/visits requested: 200mg  x3 DOSES Reference number:  Approval from: 08/11/24 to 10/21/24

## 2024-08-24 ENCOUNTER — Other Ambulatory Visit: Payer: Self-pay | Admitting: Physician Assistant

## 2024-08-24 ENCOUNTER — Encounter (HOSPITAL_COMMUNITY): Payer: Self-pay

## 2024-08-24 ENCOUNTER — Other Ambulatory Visit (HOSPITAL_COMMUNITY): Payer: Self-pay

## 2024-08-25 ENCOUNTER — Other Ambulatory Visit (HOSPITAL_COMMUNITY): Payer: Self-pay

## 2024-08-26 ENCOUNTER — Ambulatory Visit

## 2024-08-26 VITALS — BP 116/74 | HR 76 | Temp 99.2°F | Resp 16 | Ht 61.0 in | Wt 187.4 lb

## 2024-08-26 DIAGNOSIS — D500 Iron deficiency anemia secondary to blood loss (chronic): Secondary | ICD-10-CM

## 2024-08-26 MED ORDER — IRON SUCROSE 20 MG/ML IV SOLN
200.0000 mg | Freq: Once | INTRAVENOUS | Status: AC
  Administered 2024-08-26: 200 mg via INTRAVENOUS
  Filled 2024-08-26: qty 10

## 2024-08-26 NOTE — Progress Notes (Signed)
 Diagnosis: Iron  Deficiency Anemia  Provider:  Praveen Mannam MD  Procedure: IV Push  IV Type: Peripheral, IV Location: L Antecubital  Venofer  (Iron  Sucrose), Dose: 200 mg  Post Infusion IV Care: Observation period completed and Peripheral IV Discontinued  Discharge: Condition: Good, Destination: Home . AVS Declined  Performed by:  Maximiano JONELLE Pouch, LPN

## 2024-09-02 ENCOUNTER — Ambulatory Visit (INDEPENDENT_AMBULATORY_CARE_PROVIDER_SITE_OTHER)

## 2024-09-02 VITALS — BP 117/78 | HR 75 | Temp 98.1°F | Resp 16 | Ht 61.0 in | Wt 188.0 lb

## 2024-09-02 DIAGNOSIS — D500 Iron deficiency anemia secondary to blood loss (chronic): Secondary | ICD-10-CM

## 2024-09-02 MED ORDER — IRON SUCROSE 20 MG/ML IV SOLN
200.0000 mg | Freq: Once | INTRAVENOUS | Status: AC
  Administered 2024-09-02: 200 mg via INTRAVENOUS
  Filled 2024-09-02: qty 10

## 2024-09-02 NOTE — Progress Notes (Signed)
 Diagnosis: Iron  Deficiency Anemia  Provider:  Praveen Mannam MD  Procedure: IV Push  IV Type: Peripheral, IV Location: L Antecubital  Venofer  (Iron  Sucrose), Dose: 200 mg  Post Infusion IV Care: Observation period completed  Discharge: Condition: Good, Destination: Home . AVS Declined  Performed by:  Rachelle Bue, RN

## 2024-09-09 ENCOUNTER — Ambulatory Visit

## 2024-09-09 VITALS — BP 141/90 | HR 72 | Temp 98.3°F | Resp 16 | Ht 61.0 in | Wt 187.6 lb

## 2024-09-09 DIAGNOSIS — D500 Iron deficiency anemia secondary to blood loss (chronic): Secondary | ICD-10-CM | POA: Diagnosis not present

## 2024-09-09 MED ORDER — IRON SUCROSE 20 MG/ML IV SOLN
200.0000 mg | Freq: Once | INTRAVENOUS | Status: AC
  Administered 2024-09-09: 200 mg via INTRAVENOUS
  Filled 2024-09-09: qty 10

## 2024-09-09 NOTE — Progress Notes (Signed)
 Diagnosis: Acute Anemia  Provider:  Chilton Greathouse MD  Procedure: IV Push  IV Type: Peripheral, IV Location: L Antecubital  Venofer (Iron Sucrose), Dose: 200 mg  Post Infusion IV Care: Observation period completed and Peripheral IV Discontinued  Discharge: Condition: Good, Destination: Home . AVS Declined  Performed by:  Nat Math, RN

## 2024-09-27 ENCOUNTER — Other Ambulatory Visit: Payer: Self-pay | Admitting: Physician Assistant

## 2024-09-28 ENCOUNTER — Other Ambulatory Visit: Payer: Self-pay

## 2024-09-28 ENCOUNTER — Other Ambulatory Visit (HOSPITAL_COMMUNITY): Payer: Self-pay

## 2024-09-28 MED ORDER — APIXABAN 5 MG PO TABS
5.0000 mg | ORAL_TABLET | Freq: Two times a day (BID) | ORAL | 0 refills | Status: DC
  Filled 2024-09-28: qty 60, 30d supply, fill #0

## 2024-10-02 DIAGNOSIS — N200 Calculus of kidney: Secondary | ICD-10-CM | POA: Diagnosis not present

## 2024-10-02 DIAGNOSIS — N281 Cyst of kidney, acquired: Secondary | ICD-10-CM | POA: Diagnosis not present

## 2024-10-11 ENCOUNTER — Other Ambulatory Visit: Payer: Self-pay | Admitting: Family Medicine

## 2024-10-11 DIAGNOSIS — J302 Other seasonal allergic rhinitis: Secondary | ICD-10-CM

## 2024-10-12 ENCOUNTER — Other Ambulatory Visit (HOSPITAL_COMMUNITY): Payer: Self-pay

## 2024-10-12 ENCOUNTER — Other Ambulatory Visit: Payer: Self-pay

## 2024-10-12 MED ORDER — ALBUTEROL SULFATE HFA 108 (90 BASE) MCG/ACT IN AERS
1.0000 | INHALATION_SPRAY | Freq: Four times a day (QID) | RESPIRATORY_TRACT | 1 refills | Status: AC | PRN
  Filled 2024-10-12: qty 6.7, 30d supply, fill #0

## 2024-10-25 ENCOUNTER — Other Ambulatory Visit: Payer: Self-pay | Admitting: Physician Assistant

## 2024-10-26 ENCOUNTER — Other Ambulatory Visit (HOSPITAL_COMMUNITY): Payer: Self-pay

## 2024-10-26 ENCOUNTER — Other Ambulatory Visit: Payer: Self-pay

## 2024-10-28 ENCOUNTER — Other Ambulatory Visit (HOSPITAL_COMMUNITY): Payer: Self-pay

## 2024-10-30 ENCOUNTER — Other Ambulatory Visit: Payer: Self-pay | Admitting: *Deleted

## 2024-10-30 ENCOUNTER — Other Ambulatory Visit (HOSPITAL_COMMUNITY): Payer: Self-pay

## 2024-10-30 MED ORDER — APIXABAN 5 MG PO TABS
5.0000 mg | ORAL_TABLET | Freq: Two times a day (BID) | ORAL | 2 refills | Status: AC
  Filled 2024-10-30: qty 60, 30d supply, fill #0

## 2024-11-03 ENCOUNTER — Other Ambulatory Visit (HOSPITAL_COMMUNITY): Payer: Self-pay

## 2024-11-03 MED ORDER — MEDROXYPROGESTERONE ACETATE 5 MG PO TABS
5.0000 mg | ORAL_TABLET | Freq: Every day | ORAL | 0 refills | Status: AC | PRN
  Filled 2024-11-03: qty 7, 7d supply, fill #0

## 2024-11-04 ENCOUNTER — Other Ambulatory Visit (HOSPITAL_COMMUNITY): Payer: Self-pay

## 2024-11-05 ENCOUNTER — Other Ambulatory Visit (HOSPITAL_COMMUNITY): Payer: Self-pay

## 2024-11-14 ENCOUNTER — Emergency Department (HOSPITAL_COMMUNITY)
Admission: EM | Admit: 2024-11-14 | Discharge: 2024-11-14 | Disposition: A | Discharge status: Home / Self Care | Attending: Emergency Medicine | Admitting: Emergency Medicine

## 2024-11-14 ENCOUNTER — Emergency Department (HOSPITAL_COMMUNITY)

## 2024-11-14 ENCOUNTER — Other Ambulatory Visit: Payer: Self-pay

## 2024-11-14 ENCOUNTER — Encounter (HOSPITAL_COMMUNITY): Payer: Self-pay | Admitting: *Deleted

## 2024-11-14 DIAGNOSIS — M79605 Pain in left leg: Secondary | ICD-10-CM | POA: Diagnosis present

## 2024-11-14 DIAGNOSIS — J45909 Unspecified asthma, uncomplicated: Secondary | ICD-10-CM | POA: Diagnosis not present

## 2024-11-14 DIAGNOSIS — I82409 Acute embolism and thrombosis of unspecified deep veins of unspecified lower extremity: Secondary | ICD-10-CM | POA: Diagnosis not present

## 2024-11-14 DIAGNOSIS — N189 Chronic kidney disease, unspecified: Secondary | ICD-10-CM | POA: Insufficient documentation

## 2024-11-14 DIAGNOSIS — Z7901 Long term (current) use of anticoagulants: Secondary | ICD-10-CM | POA: Insufficient documentation

## 2024-11-14 DIAGNOSIS — M79662 Pain in left lower leg: Secondary | ICD-10-CM | POA: Diagnosis not present

## 2024-11-14 DIAGNOSIS — I129 Hypertensive chronic kidney disease with stage 1 through stage 4 chronic kidney disease, or unspecified chronic kidney disease: Secondary | ICD-10-CM | POA: Insufficient documentation

## 2024-11-14 MED ORDER — DEXAMETHASONE SOD PHOSPHATE PF 10 MG/ML IJ SOLN
10.0000 mg | Freq: Once | INTRAMUSCULAR | Status: AC
  Administered 2024-11-14: 10 mg via INTRAMUSCULAR
  Filled 2024-11-14: qty 1

## 2024-11-14 NOTE — ED Provider Notes (Signed)
 " [place] EMERGENCY DEPARTMENT AT [facility] Provider Note   CSN: [id number] Arrival date & time: 11/14/24  1627     Patient presents with: Leg Pain   Barbara Dunn is a 54 y.o. female.  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  Patient also reports associated swelling.  Patient does report a radicular type pain shooting up her leg and into her groin.  Patient denies numbness, weakness, shortness of breath, chest pain, or trauma.  Patient does report missing doses of her Eliquis  occasionally.    Leg Pain      Prior to Admission medications  Medication Sig Start Date End Date Taking? Authorizing Provider  albuterol  (VENTOLIN  HFA) 108 (90 Base) MCG/ACT inhaler Inhale 1 puff into the lungs every 6 (six) hours as needed for wheezing and shortness of breath 10/12/24   Delbert Clam, MD  amLODipine  (NORVASC ) 2.5 MG tablet Take 1 tablet (2.5 mg total) by mouth daily. 05/18/24   Newlin, Enobong, MD  apixaban  (ELIQUIS ) 5 MG TABS tablet Take 1 tablet (5 mg total) by mouth 2 (two) times daily. Hematology needs to send future refills. 10/30/24   Neomi Johnston DASEN, PA-C  APPLE CIDER VINEGAR PO Take 1 capsule by mouth daily.    [provider]  BIOTIN PO Take 2 tablets by mouth daily.     [provider]  cetirizine (ZYRTEC) 10 MG tablet Take 10 mg by mouth daily.    [provider]  [employer] FIBER GUMMY BEARS CHILDREN PO Take by mouth. Also contains B12, vitamin C, and vitamin D     [provider]  docusate sodium (COLACE) 100 MG capsule Take 100 mg by mouth as needed.     [provider]  ELDERBERRY PO Take by mouth.    [provider]  fluticasone  (FLONASE ) 50 MCG/ACT nasal spray PLACE 2 SPRAYS INTO BOTH NOSTRILS DAILY. 01/11/20   Newlin, Enobong, MD  fluticasone  (FLOVENT  HFA) 110 MCG/ACT inhaler INHALE 1 PUFF INTO THE  LUNGS 2 (TWO) TIMES DAILY. 07/18/22   Newlin, Enobong, MD  hydrOXYzine  (ATARAX ) 25 MG tablet Take 1 tablet (25 mg total) by mouth at bedtime as needed. 11/19/23   Newlin, Enobong, MD  Iron , Ferrous Sulfate , 325 (65 Fe) MG TABS Take 325 mg by mouth 2 (two) times daily. 09/18/23   Newlin, Enobong, MD  lisinopril  (ZESTRIL ) 20 MG tablet Take 1 tablet (20 mg total) by mouth daily. 05/18/24   Newlin, Enobong, MD  Magnesium 250 MG TABS Take 250 mg by mouth daily.    [provider]  meclizine  (ANTIVERT ) 25 MG tablet Take 1 tablet (25 mg total) by mouth 3 (three) times daily as needed for dizziness. 11/19/23   Newlin, Enobong, MD  medroxyPROGESTERone  (PROVERA ) 5 MG tablet Take 1 tablet every day as needed for heavy menstrual bleeding 11/03/24     Multiple Vitamins-Minerals (MULTIVITAMIN ADULT PO) Take 1 tablet by mouth daily.     [provider]  oxymetazoline  (AFRIN NASAL SPRAY) 0.05 % nasal spray Place 1 spray into both nostrils 2 (two) times daily. 10/31/21   Newlin, Enobong, MD  rosuvastatin  (CRESTOR ) 10 MG tablet Take 1 tablet (10 mg total) by mouth daily. Patient not taking: Reported on 08/11/2024 01/01/24 06/12/24  Vannie Reche RAMAN, NP  SUMAtriptan  (IMITREX ) 100 MG tablet Take 1 tablet (100 mg total) by mouth once for 1 dose at the onset of  a migraine. May repeat 1 tablet in 2 hours if migraine persists.  Maximum daily dose 200 mg 11/19/23 11/11/24  Newlin, Enobong, MD  topiramate  (TOPAMAX ) 50 MG tablet Take 1 tablet (50 mg total) by mouth 2 (two) times daily. 05/18/24   Newlin, Enobong, MD  triamcinolone  cream (KENALOG ) 0.1 % APPLY 1 APPLICATION TOPICALLY 2 (TWO) TIMES DAILY. 04/06/24   Newlin, Enobong, MD  vitamin C (ASCORBIC ACID) 250 MG tablet Take 250 mg by mouth daily.    [provider]  diphenhydrAMINE  (BENADRYL ) 25 mg capsule Take 25 mg by mouth every 6 (six) hours as needed for itching. Reported on 01/23/2016  12/07/19  [provider]    Allergies: Patient has no  known allergies.    Review of Systems  Cardiovascular:  Positive for leg swelling.    Updated Vital Signs BP (!) 136/91   Pulse 72   Temp 98.1 F (36.7 C) (Oral)   Resp 16   Ht 5' 1 (1.549 m)   Wt 85.1 kg   LMP 11/07/2024   SpO2 100%   BMI 35.45 kg/m   Physical Exam Vitals and nursing note reviewed.  Constitutional:      General: She is not in acute distress.    Appearance: She is well-developed. She is not toxic-appearing.  HENT:     Head: Normocephalic and atraumatic.     Right Ear: External ear normal.     Left Ear: External ear normal.  Eyes:     Conjunctiva/sclera: Conjunctivae normal.  Cardiovascular:     Rate and Rhythm: Normal rate and regular rhythm.     Pulses: Normal pulses.     Heart sounds: Normal heart sounds. No murmur heard. Pulmonary:     Effort: Pulmonary effort is normal. No respiratory distress.     Breath sounds: Normal breath sounds.  Abdominal:     Palpations: Abdomen is soft.     Tenderness: There is no abdominal tenderness.  Musculoskeletal:        General: Swelling and tenderness present.     Cervical back: Neck supple.     Comments: Some mild swelling and tenderness to palpation of the left thigh and into the groin.  Patient neurovascularly intact with +2 dorsalis pedis pulses.  Skin:    General: Skin is warm and dry.     Capillary Refill: Capillary refill takes less than 2 seconds.  Neurological:     Mental Status: She is alert.  Psychiatric:        Mood and Affect: Mood normal.     (all labs ordered are listed, but only abnormal results are displayed) Labs Reviewed - No data to display  EKG: None  Radiology: VAS US  LOWER EXTREMITY VENOUS (DVT) (7a-5p) Result Date: 11/14/2024  Lower Venous DVT Study Patient Name:  Barbara Dunn  Date of Exam:   11/14/2024 Medical Rec #: [id number]    Accession #:    [id number] Date of Birth: [birth year]     Patient Gender: F Patient Age:   38 years Exam Location:  [facility] Procedure:       VAS US  LOWER EXTREMITY VENOUS (DVT) Referring Phys: CHRISTIAN PROSPERI --------------------------------------------------------------------------------  Indications: Pain radiating from knee to groin. History of DVT.  Anticoagulation: Eliquis . Comparison Study: Prior negative bilateral LEV done 05/12/24 Performing Technologist: Alberta Lis RVS  Examination Guidelines: A complete evaluation includes B-mode imaging, spectral Doppler, color Doppler, and power Doppler as needed of all accessible portions of each vessel. Bilateral testing is considered an  integral part of a complete examination. Limited examinations for reoccurring indications may be performed as noted. The reflux portion of the exam is performed with the patient in reverse Trendelenburg.  +-----+---------------+---------+-----------+----------+--------------+ RIGHTCompressibilityPhasicitySpontaneityPropertiesThrombus Aging +-----+---------------+---------+-----------+----------+--------------+ CFV  Full           Yes      Yes                                 +-----+---------------+---------+-----------+----------+--------------+ SFJ  Full                                                        +-----+---------------+---------+-----------+----------+--------------+   +---------+---------------+---------+-----------+----------+--------------+ LEFT     CompressibilityPhasicitySpontaneityPropertiesThrombus Aging +---------+---------------+---------+-----------+----------+--------------+ CFV      Full           Yes      Yes                                 +---------+---------------+---------+-----------+----------+--------------+ SFJ      Full                                                        +---------+---------------+---------+-----------+----------+--------------+ FV Prox  Full           Yes      Yes                                  +---------+---------------+---------+-----------+----------+--------------+ FV Mid   Full                                                        +---------+---------------+---------+-----------+----------+--------------+ FV DistalFull                                                        +---------+---------------+---------+-----------+----------+--------------+ PFV      Full           Yes      Yes                                 +---------+---------------+---------+-----------+----------+--------------+ POP      Full           Yes      Yes                                 +---------+---------------+---------+-----------+----------+--------------+ PTV      Full                                                        +---------+---------------+---------+-----------+----------+--------------+  PERO     Full                                                        +---------+---------------+---------+-----------+----------+--------------+ Gastroc  Full                                                        +---------+---------------+---------+-----------+----------+--------------+    Summary: RIGHT: - No evidence of common femoral vein obstruction.   LEFT: - There is no evidence of deep vein thrombosis in the lower extremity.  - No cystic structure found in the popliteal fossa.  *See table(s) above for measurements and observations.    Preliminary      Procedures   Medications Ordered in the ED  dexamethasone  (DECADRON ) injection 10 mg (has no administration in time range)                                    Medical Decision Making  This patient presents to the ED for concern of left lower extremity pain and swelling differential diagnosis includes venous insufficiency, arterial occlusion, DVT, dependent edema, musculoskeletal pain, fracture, dislocation    Additional history obtained   Additional history obtained from Electronic Medical Record External  records from outside source obtained and reviewed including Care Everywhere  Imaging Studies ordered:  I ordered imaging studies including left lower extremity DVT ultrasound I independently visualized and interpreted imaging which showed negative for DVT I agree with the radiologist interpretation   Medicines ordered and prescription drug management:  I ordered medication including Decadron     I have reviewed the patients home medicines and have made adjustments as needed   Problem List / ED Course:  Considered for admission or further workup however patient's vital signs, physical exam, and imaging are reassuring.  Patient given shot of Decadron  to help with inflammation and advised to alternate Tylenol  Motrin .  Patient given return precautions.  I feel patient is safe for discharge at this time.     Final diagnoses:  Left leg pain    ED Discharge Orders     None          Francis Ileana LOISE DEVONNA 11/14/24 1745    Doretha Folks, MD 11/14/24 2049  "

## 2024-11-14 NOTE — Discharge Instructions (Addendum)
 Today you were seen for left lower extremity pain and swelling.  I suspect there is a nerve pain component to your pain and have given you a shot of steroid that should last the next several days.  You may also take Tylenol /Motrin  for pain and swelling as tolerated.  Thank you for letting us  treat you today. After reviewing your imaging, I feel you are safe to go home. Please follow up with your PCP in the next several days and provide them with your records from this visit. Return to the Emergency Room if pain becomes severe or symptoms worsen.

## 2024-11-14 NOTE — Progress Notes (Signed)
 VASCULAR LAB    Left lower extremity venous duplex has been performed.  See CV proc for preliminary results.   Michaelene Dutan, RVT 11/14/2024, 5:34 PM

## 2024-11-14 NOTE — ED Triage Notes (Signed)
 Pt c/o pain in left leg from knee up to left [id number]. Pt states she has some swelling.

## 2024-11-17 ENCOUNTER — Telehealth: Payer: Self-pay | Admitting: Family Medicine

## 2024-11-17 NOTE — Telephone Encounter (Signed)
 Pt confirmed appt

## 2024-11-18 ENCOUNTER — Encounter: Payer: Self-pay | Admitting: Family Medicine

## 2024-11-18 ENCOUNTER — Ambulatory Visit: Attending: Family Medicine | Admitting: Family Medicine

## 2024-11-18 ENCOUNTER — Other Ambulatory Visit (HOSPITAL_COMMUNITY): Payer: Self-pay

## 2024-11-18 VITALS — BP 134/84 | HR 70 | Temp 98.4°F | Ht 61.0 in | Wt 191.4 lb

## 2024-11-18 DIAGNOSIS — M79605 Pain in left leg: Secondary | ICD-10-CM | POA: Diagnosis not present

## 2024-11-18 DIAGNOSIS — I749 Embolism and thrombosis of unspecified artery: Secondary | ICD-10-CM | POA: Diagnosis not present

## 2024-11-18 DIAGNOSIS — D500 Iron deficiency anemia secondary to blood loss (chronic): Secondary | ICD-10-CM

## 2024-11-18 DIAGNOSIS — D509 Iron deficiency anemia, unspecified: Secondary | ICD-10-CM

## 2024-11-18 DIAGNOSIS — N939 Abnormal uterine and vaginal bleeding, unspecified: Secondary | ICD-10-CM | POA: Diagnosis not present

## 2024-11-18 DIAGNOSIS — G43009 Migraine without aura, not intractable, without status migrainosus: Secondary | ICD-10-CM

## 2024-11-18 DIAGNOSIS — N760 Acute vaginitis: Secondary | ICD-10-CM

## 2024-11-18 DIAGNOSIS — I10 Essential (primary) hypertension: Secondary | ICD-10-CM | POA: Diagnosis not present

## 2024-11-18 DIAGNOSIS — E7841 Elevated Lipoprotein(a): Secondary | ICD-10-CM | POA: Diagnosis not present

## 2024-11-18 DIAGNOSIS — G720 Drug-induced myopathy: Secondary | ICD-10-CM

## 2024-11-18 MED ORDER — FLUCONAZOLE 150 MG PO TABS
150.0000 mg | ORAL_TABLET | Freq: Once | ORAL | 6 refills | Status: AC
  Filled 2024-11-18: qty 2, 2d supply, fill #0

## 2024-11-18 MED ORDER — EZETIMIBE 10 MG PO TABS
10.0000 mg | ORAL_TABLET | Freq: Every day | ORAL | 3 refills | Status: AC
  Filled 2024-11-18: qty 90, 90d supply, fill #0

## 2024-11-18 MED ORDER — LISINOPRIL 20 MG PO TABS
20.0000 mg | ORAL_TABLET | Freq: Every day | ORAL | 1 refills | Status: AC
  Filled 2024-11-18: qty 90, 90d supply, fill #0

## 2024-11-18 MED ORDER — AMLODIPINE BESYLATE 2.5 MG PO TABS
2.5000 mg | ORAL_TABLET | Freq: Every day | ORAL | 1 refills | Status: AC
  Filled 2024-11-18: qty 90, 90d supply, fill #0

## 2024-11-18 NOTE — Patient Instructions (Signed)
 VISIT SUMMARY:  During your visit, we discussed your recent left leg pain, heavy menstrual bleeding, recurrent yeast infections, and other ongoing health concerns. We reviewed your current medications and made adjustments where necessary.  YOUR PLAN:  -LEFT LEG PAIN: Your left leg pain, which was nerve-related, improved significantly after a steroid injection. There is no blood clot. If the pain returns, consider physical therapy.  -ACUTE VAGINITIS: You have recurrent yeast infections likely due to soap exposure. You have been prescribed Diflucan , which you should take as one pill and repeat if symptoms persist after 72 hours. You have six refills available.  -HEAVY MENSTRUAL BLEEDING WITH UTERINE FIBROIDS: Your heavy menstrual bleeding is due to fibroids. You are avoiding hormonal therapy because of your history of blood clots. Follow up with gynecology as needed.  -ESSENTIAL HYPERTENSION: Your blood pressure is being managed with amlodipine . Continue your current antihypertensive regimen.  -HYPERLIPIDEMIA: You have high cholesterol, and we discontinued rosuvastatin  due to muscle cramps. Your cardiovascular risk is low. You have been prescribed ezetimibe  10 mg daily, and a fasting lipid panel has been ordered.  -IRON  DEFICIENCY ANEMIA: You have iron  deficiency anemia and receive iron  supplementation and infusions as directed by your oncology provider. Iron  studies have been ordered.  -MIGRAINE: Your migraines are well-controlled with Topamax  and Imitrex . Continue your current migraine management regimen.  -GENERAL HEALTH MAINTENANCE: You are due for a pneumonia vaccine.  INSTRUCTIONS:  Please follow up with gynecology as needed for your heavy menstrual bleeding. A fasting lipid panel and iron  studies have been ordered. Consider physical therapy if your left leg pain returns.

## 2024-11-18 NOTE — Progress Notes (Signed)
 "  Subjective:  Patient ID: Barbara Dunn, female    DOB: 09/03/1971  Age: 53 y.o. MRN: [id number]  CC: Medical Management of Chronic Issues (Discuss the result from the hospital.)     Discussed the use of AI scribe software for clinical note transcription with the patient, who gave verbal consent to proceed.  History of Present Illness Barbara Dunn is a 54 year old female  with a history of hypertension, migraine, thromboembolic disorder- right peroneal DVT, left lower extremity DVT involving left femoral, peroneal, popliteal, tibioperoneal trunk, PE in 08/2023, MGUS, iron  deficiency anemia who presents for follow-up.  She presents for follow-up after an ED visit four days ago for left leg pain extending from the knee to the groin, described as nerve-like.  DVT was ruled out.  She received a steroid injection there with significant improvement in pain.  Symptoms are absent at the moment.  She has had recurrent bilateral lower extremity DVT, PE and takes Eliquis  indefinitely. She has heavy menstrual bleeding, sometimes using up to ten pads a day and occasional bleeding when wiping. She has two fibroids and is followed by gynecology, but insurance no longer covers those visits. She is hesitant to start Provera  for bleeding control because of her clot history.  She has occasional yeast infections, which she links to soap exposure in baths, and prefers oral treatment due to cost.  She has iron  deficiency anemia and receives iron  infusions as needed. She does not take daily iron  pills and follows her oncology provider's advice to use them only when necessary.  Her last hemoglobin from 07/2024 was normal.  She has migraines controlled with Topamax  and Imitrex . She has anxiety and keeps hydroxyzine  for sleep but rarely uses it due to concern about oversleeping. She has not been taking her statin prescribed by cardiology due to myalgias associated with her statin which resolved after discontinuation.  She  has an upcoming appointment with cardiology. 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    Past Medical History:  Diagnosis Date   Anemia    Arthritis    Asthma    Bronchitis    Chronic kidney disease    DVT (deep venous thrombosis) (HCC)    Hypertension    Kidney stone    Migraines    Obesity    Pulmonary embolism [facility])     Past Surgical History:  Procedure Laterality Date   CARDIAC CATHETERIZATION N/A 08/02/2015   Procedure: Left Heart Cath and Coronary Angiography;  Surgeon: Debby DELENA Sor, MD;  Location: [facility] INVASIVE CV LAB;  Service: Cardiovascular;  Laterality: N/A;   CERVIX LESION DESTRUCTION     TUBAL LIGATION     TUBAL LIGATION      Family History  Problem Relation Age of Onset   Hypertension Mother    Schizophrenia Mother    Schizophrenia Sister    Schizophrenia Brother    Schizophrenia Brother    Schizophrenia Son    Colon cancer Neg Hx    Esophageal cancer Neg Hx    Colon polyps Neg Hx    Rectal cancer Neg Hx    Stomach cancer Neg Hx     Social History   Socioeconomic History   Marital status: Single    Spouse name: Not on file   Number of children: Not on file   Years of education: Not on file   Highest education level: Not on file  Occupational History   Not on file  Tobacco Use  Smoking status: Former    Current packs/day: 0.00    Types: Cigarettes    Quit date: 12/15/2003    Years since quitting: 20.9   Smokeless tobacco: Never   Tobacco comments:    quit 10 years ago  Vaping Use   Vaping status: Never Used  Substance and Sexual Activity   Alcohol use: Yes    Alcohol/week: 2.0 standard drinks of alcohol    Types: 1 Glasses of wine, 1 Shots of liquor per week    Comment: Once every 3 months    Drug use: No   Sexual activity: Yes    Birth control/protection: Other-see comments    Comment: tubal  Other Topics Concern   Not on file  Social History Narrative   Pt has GED. CNA as well.    Social Drivers  of Health   Tobacco Use: Medium Risk (11/14/2024)   Patient History    Smoking Tobacco Use: Former    Smokeless Tobacco Use: Never    Passive Exposure: Not on file  Financial Resource Strain: Medium Risk (11/19/2023)   Overall Financial Resource Strain (CARDIA)    Difficulty of Paying Living Expenses: Somewhat hard  Food Insecurity: Food Insecurity Present (11/19/2023)   Hunger Vital Sign    Worried About Running Out of Food in the Last Year: Sometimes true    Ran Out of Food in the Last Year: Sometimes true  Transportation Needs: No Transportation Needs (11/19/2023)   PRAPARE - Administrator, Civil Service (Medical): No    Lack of Transportation (Non-Medical): No  Physical Activity: Inactive (11/19/2023)   Exercise Vital Sign    Days of Exercise per Week: 0 days    Minutes of Exercise per Session: 0 min  Stress: Stress Concern Present (11/19/2023)   [employer] of Occupational Health - Occupational Stress Questionnaire    Feeling of Stress : To some extent  Social Connections: Moderately Isolated (11/19/2023)   Social Connection and Isolation Panel    Frequency of Communication with Friends and Family: Three times a week    Frequency of Social Gatherings with Friends and Family: Once a week    Attends Religious Services: Never    Database Administrator or Organizations: No    Attends Banker Meetings: 1 to 4 times per year    Marital Status: Never married  Depression (PHQ2-9): Medium Risk (11/18/2024)   Depression (PHQ2-9)    PHQ-2 Score: 8  Alcohol Screen: Low Risk (11/19/2023)   Alcohol Screen    Last Alcohol Screening Score (AUDIT): 1  Housing: Unknown (11/19/2023)   Housing Stability Vital Sign    Unable to Pay for Housing in the Last Year: No    Number of Times Moved in the Last Year: Not on file    Homeless in the Last Year: No  Utilities: Not At Risk (11/19/2023)   AHC Utilities    Threatened with loss of utilities: No  Health Literacy: Not  on file    Allergies[1]  Outpatient Medications Prior to Visit  Medication Sig Dispense Refill   albuterol  (VENTOLIN  HFA) 108 (90 Base) MCG/ACT inhaler Inhale 1 puff into the lungs every 6 (six) hours as needed for wheezing and shortness of breath 6.7 g 1   apixaban  (ELIQUIS ) 5 MG TABS tablet Take 1 tablet (5 mg total) by mouth 2 (two) times daily. Hematology needs to send future refills. 60 tablet 2   APPLE CIDER VINEGAR PO Take 1 capsule by mouth daily.  BIOTIN PO Take 2 tablets by mouth daily.      cetirizine (ZYRTEC) 10 MG tablet Take 10 mg by mouth daily.     [employer] FIBER GUMMY BEARS CHILDREN PO Take by mouth. Also contains B12, vitamin C, and vitamin D      docusate sodium (COLACE) 100 MG capsule Take 100 mg by mouth as needed.      ELDERBERRY PO Take by mouth.     fluticasone  (FLONASE ) 50 MCG/ACT nasal spray PLACE 2 SPRAYS INTO BOTH NOSTRILS DAILY. 16 g 2   fluticasone  (FLOVENT  HFA) 110 MCG/ACT inhaler INHALE 1 PUFF INTO THE LUNGS 2 (TWO) TIMES DAILY. 12 g 2   Iron , Ferrous Sulfate , 325 (65 Fe) MG TABS Take 325 mg by mouth 2 (two) times daily. 60 tablet 3   Magnesium 250 MG TABS Take 250 mg by mouth daily.     meclizine  (ANTIVERT ) 25 MG tablet Take 1 tablet (25 mg total) by mouth 3 (three) times daily as needed for dizziness. 60 tablet 1   medroxyPROGESTERone  (PROVERA ) 5 MG tablet Take 1 tablet every day as needed for heavy menstrual bleeding 7 tablet 0   Multiple Vitamins-Minerals (MULTIVITAMIN ADULT PO) Take 1 tablet by mouth daily.      oxymetazoline  (AFRIN NASAL SPRAY) 0.05 % nasal spray Place 1 spray into both nostrils 2 (two) times daily. 30 mL 0   SUMAtriptan  (IMITREX ) 100 MG tablet Take 1 tablet (100 mg total) by mouth once for 1 dose at the onset of a migraine. May repeat 1 tablet in 2 hours if migraine persists.  Maximum daily dose 200 mg 10 tablet 6   topiramate  (TOPAMAX ) 50 MG tablet Take 1 tablet (50 mg total) by mouth 2 (two) times daily. 180 tablet 1   triamcinolone   cream (KENALOG ) 0.1 % APPLY 1 APPLICATION TOPICALLY 2 (TWO) TIMES DAILY. 30 g 2   vitamin C (ASCORBIC ACID) 250 MG tablet Take 250 mg by mouth daily.     amLODipine  (NORVASC ) 2.5 MG tablet Take 1 tablet (2.5 mg total) by mouth daily. 90 tablet 1   lisinopril  (ZESTRIL ) 20 MG tablet Take 1 tablet (20 mg total) by mouth daily. 90 tablet 1   hydrOXYzine  (ATARAX ) 25 MG tablet Take 1 tablet (25 mg total) by mouth at bedtime as needed. (Patient not taking: Reported on 11/18/2024) 90 tablet 1   rosuvastatin  (CRESTOR ) 10 MG tablet Take 1 tablet (10 mg total) by mouth daily. (Patient not taking: Reported on 11/18/2024) 90 tablet 3   No facility-administered medications prior to visit.     ROS Review of Systems  Constitutional:  Negative for activity change and appetite change.  HENT:  Negative for sinus pressure and sore throat.   Respiratory:  Negative for chest tightness, shortness of breath and wheezing.   Cardiovascular:  Negative for chest pain and palpitations.  Gastrointestinal:  Negative for abdominal distention, abdominal pain and constipation.  Genitourinary: Negative.   Musculoskeletal: Negative.   Psychiatric/Behavioral:  Negative for behavioral problems and dysphoric mood.     Objective:  BP 134/84   Pulse 70   Temp 98.4 F (36.9 C) (Oral)   Ht 5' 1 (1.549 m)   Wt 191 lb 6.4 oz (86.8 kg)   LMP 11/07/2024   SpO2 100%   BMI 36.16 kg/m      11/18/2024    3:29 PM 11/14/2024    6:08 PM 11/14/2024    4:42 PM  BP/Weight  Systolic BP 134 138   Diastolic BP 84  87   Wt. (Lbs) 191.4  187.61  BMI 36.16 kg/m2  35.45 kg/m2      Physical Exam Constitutional:      Appearance: She is well-developed.  Cardiovascular:     Rate and Rhythm: Normal rate.     Heart sounds: Normal heart sounds. No murmur heard. Pulmonary:     Effort: Pulmonary effort is normal.     Breath sounds: Normal breath sounds. No wheezing or rales.  Chest:     Chest wall: No tenderness.  Abdominal:      General: Bowel sounds are normal. There is no distension.     Palpations: Abdomen is soft. There is no mass.     Tenderness: There is no abdominal tenderness.  Musculoskeletal:        General: Normal range of motion.     Right lower leg: No edema.     Left lower leg: No edema.  Neurological:     Mental Status: She is alert and oriented to person, place, and time.  Psychiatric:        Mood and Affect: Mood normal.        Latest Ref Rng & Units 07/28/2024    1:38 PM 07/06/2024    4:06 PM 05/12/2024   11:29 AM  CMP  Glucose 70 - 99 mg/dL 83  87  89   BUN 6 - 20 mg/dL 13  9  8    Creatinine 0.44 - 1.00 mg/dL 8.95  8.89  9.11   Sodium 135 - 145 mmol/L 140  142  140   Potassium 3.5 - 5.1 mmol/L 3.6  3.9  3.8   Chloride 98 - 111 mmol/L 111  111  110   CO2 22 - 32 mmol/L 24   26   Calcium  8.9 - 10.3 mg/dL 9.3   8.7   Total Protein 6.5 - 8.1 g/dL 7.6   6.8   Total Bilirubin 0.0 - 1.2 mg/dL 0.5   0.4   Alkaline Phos 38 - 126 U/L 51   49   AST 15 - 41 U/L 17   20   ALT 0 - 44 U/L 12   13     Lipid Panel     Component Value Date/Time   CHOL 149 04/07/2024 1521   TRIG 70 04/07/2024 1521   HDL 71 04/07/2024 1521   CHOLHDL 2.1 04/07/2024 1521   CHOLHDL 1.8 07/27/2016 0917   VLDL 14 07/27/2016 0917   LDLCALC 64 04/07/2024 1521    CBC    Component Value Date/Time   WBC 6.7 07/28/2024 1338   WBC 6.8 07/06/2024 1605   RBC 4.03 07/28/2024 1339   RBC 4.15 07/28/2024 1338   HGB 12.1 07/28/2024 1338   HGB 11.6 02/15/2023 0944   HCT 37.2 07/28/2024 1338   HCT 36.9 02/15/2023 0944   PLT 221 07/28/2024 1338   PLT 265 02/15/2023 0944   MCV 89.6 07/28/2024 1338   MCV 90 02/15/2023 0944   MCH 29.2 07/28/2024 1338   MCHC 32.5 07/28/2024 1338   RDW 13.2 07/28/2024 1338   RDW 12.1 02/15/2023 0944   LYMPHSABS 2.8 07/28/2024 1338   LYMPHSABS 2.1 11/20/2022 0917   MONOABS 0.6 07/28/2024 1338   EOSABS 0.1 07/28/2024 1338   EOSABS 0.1 11/20/2022 0917   BASOSABS 0.1 07/28/2024 1338    BASOSABS 0.1 11/20/2022 9082    Lab Results  Component Value Date   HGBA1C 5.0 05/18/2024    The 10-year ASCVD risk score (Arnett DK,  et al., 2019) is: 2.7%   Values used to calculate the score:     Age: 56 years     Clinically relevant sex: Female     Is Non-Hispanic African American: Yes     Diabetic: No     Tobacco smoker: No     Systolic Blood Pressure: 134 mmHg     Is BP treated: Yes     HDL Cholesterol: 71 mg/dL     Total Cholesterol: 149 mg/dL     Assessment & Plan Left leg pain Nerve-related pain relieved by steroid injection.  - Consider physical therapy if pain recurs.  Acute vaginitis Recurrent yeast infections likely due to soap exposure. Prefers oral medication. - Prescribed Diflucan  with instructions to take one pill and repeat if symptoms persist after 72 hours. - Provided six refills.  Abnormal uterine bleeding Heavy bleeding due to fibroids. Avoiding hormonal therapy due to clot risk. - Follow up with gynecology as needed.  Essential hypertension Blood pressure managed with amlodipine . - Continue current antihypertensive regimen. -Counseled on blood pressure goal of less than 130/80, low-sodium, DASH diet, medication compliance, 150 minutes of moderate intensity exercise per week. Discussed medication compliance, adverse effects.  Elevated lipoprotein a Discontinued rosuvastatin  due to muscle cramps. Low 10-year cardiovascular risk of 2.9% however she does have an elevated lipoprotein a. Ezetimibe  considered. - Prescribed ezetimibe  10 mg daily. - Ordered fasting lipid panel.  Iron  deficiency anemia Iron  supplementation and infusions as directed by oncology. -Last CBC from 07/2024 was normal - Ordered CBC  Migraine Well-controlled with Topamax  and Imitrex . - Continue current migraine management regimen.   Thromboembolic disorder -History of recurrent DVT and PE - On indefinite anticoagulation with Eliquis    General health maintenance Due  for pneumonia vaccine -she declines at this time      Meds ordered this encounter  Medications   fluconazole  (DIFLUCAN ) 150 MG tablet    Sig: Take 1 tablet (150 mg total) by mouth once for 1 dose. May repeat in 72 hours if symptoms persist    Dispense:  2 tablet    Refill:  6   ezetimibe  (ZETIA ) 10 MG tablet    Sig: Take 1 tablet (10 mg total) by mouth daily.    Dispense:  90 tablet    Refill:  3   amLODipine  (NORVASC ) 2.5 MG tablet    Sig: Take 1 tablet (2.5 mg total) by mouth daily.    Dispense:  90 tablet    Refill:  1    Dose decrease   lisinopril  (ZESTRIL ) 20 MG tablet    Sig: Take 1 tablet (20 mg total) by mouth daily.    Dispense:  90 tablet    Refill:  1    Follow-up: Return in about 6 months (around 05/18/2025) for Chronic medical conditions.       Corrina Sabin, MD, FAAFP. [facility] [place], [place] [phone]   11/18/2024, 5:40 PM    [1] No Known Allergies  "

## 2024-11-20 ENCOUNTER — Ambulatory Visit: Attending: Family Medicine

## 2024-11-20 DIAGNOSIS — D500 Iron deficiency anemia secondary to blood loss (chronic): Secondary | ICD-10-CM

## 2024-11-20 DIAGNOSIS — I10 Essential (primary) hypertension: Secondary | ICD-10-CM

## 2024-11-21 LAB — CBC WITH DIFFERENTIAL/PLATELET
Basophils Absolute: 0.1 10*3/uL (ref 0.0–0.2)
Basos: 1 %
EOS (ABSOLUTE): 0.1 10*3/uL (ref 0.0–0.4)
Eos: 2 %
Hematocrit: 41.1 % (ref 34.0–46.6)
Hemoglobin: 13.2 g/dL (ref 11.1–15.9)
Immature Grans (Abs): 0 10*3/uL (ref 0.0–0.1)
Immature Granulocytes: 0 %
Lymphocytes Absolute: 2.5 10*3/uL (ref 0.7–3.1)
Lymphs: 40 %
MCH: 29.6 pg (ref 26.6–33.0)
MCHC: 32.1 g/dL (ref 31.5–35.7)
MCV: 92 fL (ref 79–97)
Monocytes Absolute: 0.6 10*3/uL (ref 0.1–0.9)
Monocytes: 10 %
Neutrophils Absolute: 2.9 10*3/uL (ref 1.4–7.0)
Neutrophils: 47 %
Platelets: 258 10*3/uL (ref 150–450)
RBC: 4.46 x10E6/uL (ref 3.77–5.28)
RDW: 12.1 % (ref 11.7–15.4)
WBC: 6.3 10*3/uL (ref 3.4–10.8)

## 2024-11-21 LAB — CMP14+EGFR
ALT: 15 [IU]/L (ref 0–32)
AST: 17 [IU]/L (ref 0–40)
Albumin: 4.3 g/dL (ref 3.8–4.9)
Alkaline Phosphatase: 64 [IU]/L (ref 49–135)
BUN/Creatinine Ratio: 11 (ref 9–23)
BUN: 11 mg/dL (ref 6–24)
Bilirubin Total: 0.5 mg/dL (ref 0.0–1.2)
CO2: 20 mmol/L (ref 20–29)
Calcium: 9.1 mg/dL (ref 8.7–10.2)
Chloride: 106 mmol/L (ref 96–106)
Creatinine, Ser: 0.98 mg/dL (ref 0.57–1.00)
Globulin, Total: 2.7 g/dL (ref 1.5–4.5)
Glucose: 82 mg/dL (ref 70–99)
Potassium: 3.9 mmol/L (ref 3.5–5.2)
Sodium: 141 mmol/L (ref 134–144)
Total Protein: 7 g/dL (ref 6.0–8.5)
eGFR: 69 mL/min/{1.73_m2}

## 2024-11-21 LAB — LP+NON-HDL CHOLESTEROL
Cholesterol, Total: 217 mg/dL — ABNORMAL HIGH (ref 100–199)
HDL: 103 mg/dL
LDL Chol Calc (NIH): 105 mg/dL — ABNORMAL HIGH (ref 0–99)
Total Non-HDL-Chol (LDL+VLDL): 114 mg/dL (ref 0–129)
Triglycerides: 50 mg/dL (ref 0–149)
VLDL Cholesterol Cal: 9 mg/dL (ref 5–40)

## 2024-11-23 ENCOUNTER — Ambulatory Visit: Payer: Self-pay | Admitting: Family Medicine

## 2024-11-26 NOTE — Progress Notes (Signed)
 Barbara Dunn                                          MRN: [id number]   11/26/2024   The [facility] Quality Team Specialist reviewed this patient medical record for the purposes of chart review for care gap closure. The following were reviewed: abstraction for care gap closure-controlling blood pressure.    [facility] Quality Team

## 2025-01-26 ENCOUNTER — Inpatient Hospital Stay

## 2025-02-09 ENCOUNTER — Inpatient Hospital Stay: Admitting: Physician Assistant

## 2025-05-18 ENCOUNTER — Ambulatory Visit: Payer: Self-pay | Admitting: Family Medicine
